# Patient Record
Sex: Male | Born: 1935 | Race: Black or African American | Hispanic: No | Marital: Married | Smoking: Former smoker
Health system: Southern US, Community
[De-identification: ages and names within clinical notes are randomized; demographics above are authoritative.]

## PROBLEM LIST (undated history)

## (undated) DIAGNOSIS — I428 Other cardiomyopathies: Secondary | ICD-10-CM

## (undated) DIAGNOSIS — I509 Heart failure, unspecified: Secondary | ICD-10-CM

## (undated) DIAGNOSIS — K5641 Fecal impaction: Secondary | ICD-10-CM

## (undated) DIAGNOSIS — I519 Heart disease, unspecified: Secondary | ICD-10-CM

## (undated) DIAGNOSIS — I495 Sick sinus syndrome: Secondary | ICD-10-CM

## (undated) DIAGNOSIS — I513 Intracardiac thrombosis, not elsewhere classified: Secondary | ICD-10-CM

## (undated) DIAGNOSIS — E119 Type 2 diabetes mellitus without complications: Secondary | ICD-10-CM

## (undated) DIAGNOSIS — Z950 Presence of cardiac pacemaker: Secondary | ICD-10-CM

## (undated) DIAGNOSIS — R001 Bradycardia, unspecified: Secondary | ICD-10-CM

## (undated) DIAGNOSIS — E039 Hypothyroidism, unspecified: Secondary | ICD-10-CM

## (undated) DIAGNOSIS — M109 Gout, unspecified: Secondary | ICD-10-CM

## (undated) DIAGNOSIS — K219 Gastro-esophageal reflux disease without esophagitis: Secondary | ICD-10-CM

## (undated) DIAGNOSIS — C61 Malignant neoplasm of prostate: Secondary | ICD-10-CM

## (undated) DIAGNOSIS — I4891 Unspecified atrial fibrillation: Secondary | ICD-10-CM

## (undated) DIAGNOSIS — R0601 Orthopnea: Secondary | ICD-10-CM

## (undated) DIAGNOSIS — I499 Cardiac arrhythmia, unspecified: Secondary | ICD-10-CM

## (undated) DIAGNOSIS — I480 Paroxysmal atrial fibrillation: Secondary | ICD-10-CM

## (undated) DIAGNOSIS — I10 Essential (primary) hypertension: Secondary | ICD-10-CM

## (undated) DIAGNOSIS — N183 Chronic kidney disease, stage 3 (moderate): Secondary | ICD-10-CM

## (undated) DIAGNOSIS — J189 Pneumonia, unspecified organism: Secondary | ICD-10-CM

## (undated) DIAGNOSIS — M1990 Unspecified osteoarthritis, unspecified site: Secondary | ICD-10-CM

## (undated) HISTORY — PX: CORONARY ANGIOPLASTY WITH STENT PLACEMENT: SHX49

## (undated) HISTORY — DX: Other cardiomyopathies: I42.8

## (undated) HISTORY — PX: INSERT / REPLACE / REMOVE PACEMAKER: SUR710

## (undated) HISTORY — PX: PROSTATE SURGERY: SHX751

---

## 1999-06-13 ENCOUNTER — Encounter: Payer: Self-pay | Admitting: Internal Medicine

## 1999-06-13 ENCOUNTER — Ambulatory Visit (HOSPITAL_COMMUNITY): Admission: RE | Admit: 1999-06-13 | Discharge: 1999-06-13 | Payer: Self-pay | Admitting: Internal Medicine

## 2001-02-09 ENCOUNTER — Other Ambulatory Visit: Admission: RE | Admit: 2001-02-09 | Discharge: 2001-02-09 | Payer: Self-pay | Admitting: Urology

## 2001-02-09 ENCOUNTER — Encounter (INDEPENDENT_AMBULATORY_CARE_PROVIDER_SITE_OTHER): Payer: Self-pay

## 2001-02-16 ENCOUNTER — Encounter: Admission: RE | Admit: 2001-02-16 | Discharge: 2001-02-16 | Payer: Self-pay | Admitting: Urology

## 2001-02-16 ENCOUNTER — Encounter: Payer: Self-pay | Admitting: Urology

## 2002-02-25 ENCOUNTER — Encounter: Payer: Self-pay | Admitting: Internal Medicine

## 2002-02-25 ENCOUNTER — Ambulatory Visit (HOSPITAL_COMMUNITY): Admission: RE | Admit: 2002-02-25 | Discharge: 2002-02-25 | Payer: Self-pay | Admitting: Internal Medicine

## 2003-12-18 ENCOUNTER — Ambulatory Visit (HOSPITAL_BASED_OUTPATIENT_CLINIC_OR_DEPARTMENT_OTHER): Admission: RE | Admit: 2003-12-18 | Discharge: 2003-12-18 | Payer: Self-pay | Admitting: Internal Medicine

## 2003-12-22 ENCOUNTER — Encounter: Admission: RE | Admit: 2003-12-22 | Discharge: 2003-12-22 | Payer: Self-pay | Admitting: Vascular Surgery

## 2003-12-27 ENCOUNTER — Encounter: Admission: RE | Admit: 2003-12-27 | Discharge: 2003-12-27 | Payer: Self-pay | Admitting: Vascular Surgery

## 2005-08-01 ENCOUNTER — Ambulatory Visit (HOSPITAL_COMMUNITY): Admission: RE | Admit: 2005-08-01 | Discharge: 2005-08-01 | Payer: Self-pay | Admitting: Internal Medicine

## 2007-03-19 ENCOUNTER — Emergency Department (HOSPITAL_COMMUNITY): Admission: EM | Admit: 2007-03-19 | Discharge: 2007-03-19 | Payer: Self-pay | Admitting: Emergency Medicine

## 2007-05-27 ENCOUNTER — Inpatient Hospital Stay (HOSPITAL_COMMUNITY): Admission: EM | Admit: 2007-05-27 | Discharge: 2007-05-30 | Payer: Self-pay | Admitting: Emergency Medicine

## 2007-05-28 ENCOUNTER — Encounter: Payer: Self-pay | Admitting: Internal Medicine

## 2007-05-28 HISTORY — PX: CARDIAC CATHETERIZATION: SHX172

## 2009-04-28 ENCOUNTER — Inpatient Hospital Stay (HOSPITAL_COMMUNITY): Admission: EM | Admit: 2009-04-28 | Discharge: 2009-05-04 | Payer: Self-pay | Admitting: Emergency Medicine

## 2009-05-24 ENCOUNTER — Encounter
Admission: RE | Admit: 2009-05-24 | Discharge: 2009-08-22 | Payer: Self-pay | Source: Home / Self Care | Admitting: Internal Medicine

## 2009-06-06 ENCOUNTER — Ambulatory Visit (HOSPITAL_COMMUNITY): Admission: RE | Admit: 2009-06-06 | Discharge: 2009-06-06 | Payer: Self-pay | Admitting: Internal Medicine

## 2009-09-20 ENCOUNTER — Encounter: Admission: RE | Admit: 2009-09-20 | Discharge: 2009-09-20 | Payer: Self-pay | Admitting: Internal Medicine

## 2010-05-06 ENCOUNTER — Encounter: Payer: Self-pay | Admitting: Vascular Surgery

## 2010-07-01 LAB — COMPREHENSIVE METABOLIC PANEL
ALT: 59 U/L — ABNORMAL HIGH (ref 0–53)
AST: 57 U/L — ABNORMAL HIGH (ref 0–37)
Alkaline Phosphatase: 75 U/L (ref 39–117)
BUN: 20 mg/dL (ref 6–23)
Calcium: 8.6 mg/dL (ref 8.4–10.5)
Chloride: 101 mEq/L (ref 96–112)
Creatinine, Ser: 1.6 mg/dL — ABNORMAL HIGH (ref 0.4–1.5)
GFR calc Af Amer: 52 mL/min — ABNORMAL LOW (ref >60)
GFR calc non Af Amer: 43 mL/min — ABNORMAL LOW (ref >60)
Glucose, Bld: 395 mg/dL — ABNORMAL HIGH (ref 70–99)
Potassium: 3.8 mEq/L (ref 3.5–5.1)
Total Protein: 7.6 g/dL (ref 6.0–8.3)

## 2010-07-01 LAB — CK: Total CK: 880 U/L — ABNORMAL HIGH (ref 7–232)

## 2010-07-01 LAB — URINALYSIS, ROUTINE W REFLEX MICROSCOPIC
Glucose, UA: >1000 mg/dL — AB
Nitrite: NEGATIVE
Specific Gravity, Urine: 1.036 — ABNORMAL HIGH (ref 1.005–1.030)
Urobilinogen, UA: 1 mg/dL (ref 0.0–1.0)

## 2010-07-01 LAB — GLUCOSE, CAPILLARY
Glucose-Capillary: 184 mg/dL — ABNORMAL HIGH (ref 70–99)
Glucose-Capillary: 300 mg/dL — ABNORMAL HIGH (ref 70–99)
Glucose-Capillary: 387 mg/dL — ABNORMAL HIGH (ref 70–99)

## 2010-07-01 LAB — CARDIAC PANEL(CRET KIN+CKTOT+MB+TROPI)
CK, MB: 3.4 ng/mL (ref 0.3–4.0)
Relative Index: 0.4 (ref 0.0–2.5)

## 2010-07-01 LAB — PROTIME-INR
Prothrombin Time: 13.8 seconds (ref 11.6–15.2)
Prothrombin Time: 14.3 seconds (ref 11.6–15.2)

## 2010-07-01 LAB — DIFFERENTIAL
Eosinophils Absolute: 0.1 10*3/uL (ref 0.0–0.7)
Lymphocytes Relative: 27 % (ref 12–46)
Monocytes Relative: 11 % (ref 3–12)

## 2010-07-01 LAB — URINE CULTURE

## 2010-07-01 LAB — CBC
Hemoglobin: 14 g/dL (ref 13.0–17.0)
MCHC: 35.1 g/dL (ref 30.0–36.0)
MCV: 92.8 fL (ref 78.0–100.0)
Platelets: 134 10*3/uL — ABNORMAL LOW (ref 150–400)
RBC: 4.3 MIL/uL (ref 4.22–5.81)
RBC: 4.59 MIL/uL (ref 4.22–5.81)
RDW: 14.2 % (ref 11.5–15.5)
WBC: 5 10*3/uL (ref 4.0–10.5)

## 2010-07-01 LAB — MAGNESIUM: Magnesium: 1.9 mg/dL (ref 1.5–2.5)

## 2010-07-01 LAB — BASIC METABOLIC PANEL
CO2: 25 mEq/L (ref 19–32)
Chloride: 101 mEq/L (ref 96–112)
Glucose, Bld: 311 mg/dL — ABNORMAL HIGH (ref 70–99)
Potassium: 3.3 mEq/L — ABNORMAL LOW (ref 3.5–5.1)
Sodium: 136 mEq/L (ref 135–145)

## 2010-07-01 LAB — URINE MICROSCOPIC-ADD ON

## 2010-07-01 LAB — HEMOGLOBIN A1C: Mean Plasma Glucose: 266 mg/dL

## 2010-07-01 LAB — SEDIMENTATION RATE: Sed Rate: 59 mm/hr — ABNORMAL HIGH (ref 0–16)

## 2010-07-01 LAB — ALDOLASE: Aldolase: 10.5 U/L — ABNORMAL HIGH (ref <=8.1)

## 2010-07-02 LAB — ALDOLASE: Aldolase: 8.2 U/L — ABNORMAL HIGH (ref <=8.1)

## 2010-07-02 LAB — GLUCOSE, CAPILLARY
Glucose-Capillary: 132 mg/dL — ABNORMAL HIGH (ref 70–99)
Glucose-Capillary: 226 mg/dL — ABNORMAL HIGH (ref 70–99)
Glucose-Capillary: 239 mg/dL — ABNORMAL HIGH (ref 70–99)
Glucose-Capillary: 242 mg/dL — ABNORMAL HIGH (ref 70–99)
Glucose-Capillary: 264 mg/dL — ABNORMAL HIGH (ref 70–99)
Glucose-Capillary: 274 mg/dL — ABNORMAL HIGH (ref 70–99)
Glucose-Capillary: 278 mg/dL — ABNORMAL HIGH (ref 70–99)
Glucose-Capillary: 291 mg/dL — ABNORMAL HIGH (ref 70–99)

## 2010-07-02 LAB — BASIC METABOLIC PANEL
BUN: 14 mg/dL (ref 6–23)
BUN: 15 mg/dL (ref 6–23)
Calcium: 8.3 mg/dL — ABNORMAL LOW (ref 8.4–10.5)
Calcium: 8.8 mg/dL (ref 8.4–10.5)
Calcium: 9 mg/dL (ref 8.4–10.5)
Creatinine, Ser: 1.39 mg/dL (ref 0.4–1.5)
GFR calc Af Amer: >60 mL/min (ref >60)
GFR calc non Af Amer: 50 mL/min — ABNORMAL LOW (ref >60)
GFR calc non Af Amer: 50 mL/min — ABNORMAL LOW (ref >60)
GFR calc non Af Amer: 51 mL/min — ABNORMAL LOW (ref >60)
Glucose, Bld: 220 mg/dL — ABNORMAL HIGH (ref 70–99)
Glucose, Bld: 250 mg/dL — ABNORMAL HIGH (ref 70–99)
Glucose, Bld: 261 mg/dL — ABNORMAL HIGH (ref 70–99)
Potassium: 2.8 mEq/L — ABNORMAL LOW (ref 3.5–5.1)
Sodium: 135 mEq/L (ref 135–145)
Sodium: 138 mEq/L (ref 135–145)

## 2010-07-02 LAB — PROTIME-INR
INR: 1.71 — ABNORMAL HIGH (ref 0.00–1.49)
INR: 1.73 — ABNORMAL HIGH (ref 0.00–1.49)
INR: 2.35 — ABNORMAL HIGH (ref 0.00–1.49)
Prothrombin Time: 19.9 seconds — ABNORMAL HIGH (ref 11.6–15.2)
Prothrombin Time: 20.1 seconds — ABNORMAL HIGH (ref 11.6–15.2)
Prothrombin Time: 25.5 seconds — ABNORMAL HIGH (ref 11.6–15.2)

## 2010-07-02 LAB — SJOGRENS SYNDROME-B EXTRACTABLE NUCLEAR ANTIBODY: SSB (La) (ENA) Antibody, IgG: <0.2 AI (ref <1.0)

## 2010-07-02 LAB — CK: Total CK: 246 U/L — ABNORMAL HIGH (ref 7–232)

## 2010-07-02 LAB — GLUCOSE, RANDOM: Glucose, Bld: 375 mg/dL — ABNORMAL HIGH (ref 70–99)

## 2010-07-02 LAB — CLOSTRIDIUM DIFFICILE EIA: C difficile Toxins A+B, EIA: NEGATIVE

## 2010-08-28 NOTE — Cardiovascular Report (Signed)
Vincent, [name] NO.:  [id number]   MEDICAL RECORD NO.:  [id number]          PATIENT TYPE:  INP   LOCATION:  3705                         FACILITY:  [facility]   PHYSICIAN:  Madaline Savage, M.D.DATE OF BIRTH:  February 24, 1936   DATE OF PROCEDURE:  05/28/2007  DATE OF DISCHARGE:                            CARDIAC CATHETERIZATION   PROCEDURES PERFORMED:  1. Selective coronary angiography by Judkins technique.  2. Retrograde left heart catheterization.  3. Left ventricular angiography.  4. Right heart catheterization.  5. Thermodilution cardiac output determinations along with Fick      cardiac output determinations.   COMPLICATIONS:  None.   INTERVENTIONS:  None.   PATIENT PROFILE:  [name] is a medical patient of Dr. Margaretmary Bayley  who has diabetes mellitus, systemic hypertension who is 75 years old and  presented with acute flash pulmonary edema.  He also has a history of  adenocarcinoma of the prostate.  Subsequent cardiac enzymes have been  negative for myocardial infarction, and the patient was brought to the  catheterization lab for evaluation of his clinical presentation and  concern that he may have undiscovered coronary disease or LV systolic  dysfunction.  His creatinine on admission was 1.57.  Upon repeat just  before today's case, his creatinine was 1.6.  The patient was given  steroids prior to today's case.  He was given some normal saline  starting at 8:00 p.m. on the day prior to the procedure.  During the  procedure, no complications occurred.   RESULTS:  Pressures:  Left ventricular pressure was 162/22, end-  diastolic pressure 33.  Central aortic pressure 173/100.  That pressure  was 173/100, mean 125.  The right atrial pressure was 10, mean 13/15.  Right ventricular pressure was 55/5, end-diastolic pressure 11.  Pulmonary artery pressure 52/17, mean 33.  Pulmonary capillary wedge  mean pressure was 18.  Fick cardiac output 4.55.   Thermodilution cardiac  output was 1.9.   ANGIOGRAPHIC RESULTS:  The patient's coronary arteries were  angiographically patent.  The right coronary artery was dominant.  There  was no significant disease in his left or right or circumflex coronary  arteries.  There were some mild luminal irregularities that were not  noteworthy.   FINAL IMPRESSION:  1. Angiographically patent coronary arteries.  2. Left ventricular ejection fraction 50%.  3. Moderate pulmonary arterial hypertension with a pulmonary artery      systolic pressure of 50.  4. Recent congestive heart failure compatible with diastolic      dysfunction.   PLAN:  The patient will be treated medically for his findings.           ______________________________  Madaline Savage, M.D.     WHG/MEDQ  D:  05/28/2007  T:  05/29/2007  Job:  [id number]   cc:   Margaretmary Bayley, M.D.  Madaline Savage, M.D.

## 2010-08-31 NOTE — Procedures (Signed)
NAME:  Vincent [name], Vincent [name]               ACCOUNT NO.:  [id number]   MEDICAL RECORD NO.:  [id number]          PATIENT TYPE:  OUT   LOCATION:  SLEEP CENTER                 FACILITY:  [facility]   PHYSICIAN:  Clinton D. Maple Hudson, M.D. DATE OF BIRTH:  25-Mar-1936   DATE OF ADMISSION:  12/18/2003  DATE OF DISCHARGE:  12/18/2003                              NOCTURNAL POLYSOMNOGRAM   REFERRING PHYSICIAN:  Margaretmary Bayley, M.D.   INDICATIONS FOR STUDY:  Hypersomnia with obstructive sleep apnea. Therefore,  sleepiness score of 5/24. Neck size 17 inches. BMI 42.6. weight 292 pounds.   MEDICATIONS:  Insulin, Norvasc, aspirin, guanfacine, and Fortam.   SLEEP ARCHITECTURE:  Total sleep time 393 minutes with sleep efficiency 87%.  Stage I was 7%, stage II 69%, stages III and IV 11%, and REM was 12% of  total sleep time. Latency to sleep onset 21 minutes. Latency to REM 118  minutes. Awake after sleep onset 52 minutes. Arousal index 26.   RESPIRATORY DATA:  Split study protocol. RDI 38.7 before CPAP consistent  with moderately severe obstructive sleep apnea/hypopnea syndrome. This  included 5 obstructive apneas and 101 hypopneas before CPAP titration. The  events were not positional. REM RDI was 60. CPAP was titrated to 11 CWP and  RDI of 1.9 per hour using a large Respironics soft gel mask.   OXYGEN DATA:  Very loud snoring before CPAP with oxygen desaturation to 84%.  After CPAP control, saturation held 94% to 96%.   CARDIAC DATA:  Normal sinus rhythm.   MOVEMENT/PARASOMNIA:  47 limb jerks were recorded of which 4 were associated  with arousal or awakening for periodic limb movement with arousal index of  0.6 per hour which is considered insignificant.   IMPRESSION/RECOMMENDATIONS:  Moderate obstructive sleep apnea/hypopnea  syndrome, RDI of 38 per hour.  Mild to moderate oxygen desaturation with  apneas at 84%. Good CPAP control at 11 CWP, RDI of 1.9 per hour, using a  large Respironics soft gel  mask.                                   ______________________________                                Rennis Chris. Maple Hudson, M.D.                                Diplomate, American Board of Sleep Medicine    CDY/MEDQ  D:  12/24/2003 09:51:13  T:  12/24/2003 16:38:46  Job:  [id number]

## 2010-08-31 NOTE — Discharge Summary (Signed)
NAMERUVIM, RISKO NO.:  [id number]   MEDICAL RECORD NO.:  [id number]          PATIENT TYPE:  INP   LOCATION:  3736                         FACILITY:  [facility]   PHYSICIAN:  Margaretmary Bayley, M.D.    DATE OF BIRTH:  Feb 11, 1936   DATE OF ADMISSION:  05/27/2007  DATE OF DISCHARGE:  05/30/2007                               DISCHARGE SUMMARY   DISCHARGE DIAGNOSES:  1. Acute congestive heart failure.  2. Diastolic hypertension.  3. Hypertensive heart disease.  4. Chronic obstructive pulmonary disease.  5. Atrial fibrillation.  6. Type 2 diabetes mellitus, insulin-requiring, and significant      insulin resistance.   REASON FOR ADMISSION:  Mr. Ivey is a 75 year old hypertensive and  poorly-controlled type 2 diabetic with documented carcinoma of the  prostate who was brought to the emergency room with the complaint of the  acute onset of wheezing and shortness of breath.  The patient states  that he had gone to bed the night prior to admission feeling quite well.  He had no respiratory complaints whatsoever.  He awakened at 2:30 in the  morning with some wheezing which was new for him.  He had no history of  asthma or congestive heart failure in the past.  He denied any  associated chest pain.  When it became obvious that he was not getting  any better, paramedics were called and the patient was brought into the  emergency room for treatment and diagnostic evaluation.   PERTINENT PHYSICAL FINDINGS:  GENERAL:  He is a well-developed obese  gentleman with a blood pressure of 214/90 and falling to 170/88 after  diuretics were given.  HEENT:  Unremarkable.  NECK:  Supple.  There was no jugular venous distention at 60 degrees,  but positive with JVD at 30 degrees.  No carotid bruits.  CHEST:  No splinting, tenderness, deformities.  He had bilateral rales  at the bases and about 1/4 all the way up going superiorly.  CARDIAC:  He had a regular rhythm with a 3+  hyperdynamic precordium.  He  did have a summation gallop, but no discernible rubs or murmurs noted.  ABDOMEN:  Obese, soft, and nontender.  No organomegaly and no mass.  EXTREMITIES:  No clubbing, cyanosis, or edema.  NEUROLOGIC:  He was alert, oriented, cooperative without any focal,  sensory, or motor deficits.   PERTINENT LABORATORY AND X-RAY DATA:  His admission white count is  13,100, hemoglobin 14.3 with hematocrit of 43.  CK-MB of 3.2 and  troponin of less than 0.05.  His comprehensive metabolic panel was  normal with exception of a glucose elevation of 322.  His BUN is 28 and  creatinine is 1.57.  Urinalysis, pH of 6.5, negative nitrites and  leukocyte esterase.  His SaO2 was 95% on room air.  His EKG revealed a  normal sinus rhythm.  No deviation of the electrical axis.  He did have  T-wave inversions in the anterolateral leads.  No acute ST elevations  and no Q waves. On a subsequent followup EKG the day after his  admission,  the patient was noted to have episodes of atrial fibrillation  with a rapid ventricular response and some aberrantly conducted beats as  well.  In general, however, his serial EKGs revealed no evolutionary  changes suggestive of an acute myocardial infarction.  His chest x-ray  revealed increased prominence of the pulmonary vessels.  He had some  peribronchial cuffing and some cardiomegaly, but no effusions.   HOSPITAL COURSE:  The patient was admitted with congestive heart failure  clinically and radiologically with episodes of sporadic atrial  fibrillation.  He responded quite well to parenteral Lasix with a brisk  diuresis.  He was started back on Diovan at 320 mg per day, Norvasc at  10 mg daily, Ecotrin, Protonix, and his diabetic control was achieved  grudgingly using a combination of basal insulin in the form of Lantus  and preprandial NovoLog, but the doses are being based on his blood  sugar levels.  He subsequently underwent a cardiac  catheterization  having been evaluated by Dr. Chanda Busing.  The patient was found not  to have any evidence of significant coronary artery disease.  This is  Dr. Truett Perna feeling that the patient required very aggressive control  of his blood pressure and suggested that the patient be treated with  sotalol at 80 mg twice a day for his intermittent and sporadic atrial  fibrillation.  Remainder of his hospitalization was uneventful.   DISCHARGE MEDICATIONS:  1. Coreg, which was to be continued as prior to admission at a dose of      12.5 mg b.i.d.  2. Diovan at 320 mg once a day.  3. Furosemide 80 mg once a day.  4. Zocor 40 mg daily.  5. Januvia 100 mg per day.  6. Sotalol at 80 mg b.i.d.  7. Ecotrin 162 mg daily.   The patient is scheduled to be seen by Dr. Elsie Lincoln in 2 weeks and he will  be seen in my office at about the same time.   DISCHARGE CONDITION:  Significantly improved.  He is discharged home on  a 3 g sodium, 1800 calorie carb modified fat-restricted diet.      Margaretmary Bayley, M.D.  Electronically Signed     PC/MEDQ  D:  07/16/2007  T:  07/17/2007  Job:  [id number]

## 2010-12-10 ENCOUNTER — Inpatient Hospital Stay (HOSPITAL_COMMUNITY)
Admission: EM | Admit: 2010-12-10 | Discharge: 2010-12-16 | DRG: 638 | Disposition: A | Discharge status: Home Health | Payer: Medicare Other | Source: Ambulatory Visit | Attending: Internal Medicine | Admitting: Internal Medicine

## 2010-12-10 DIAGNOSIS — Z7901 Long term (current) use of anticoagulants: Secondary | ICD-10-CM

## 2010-12-10 DIAGNOSIS — I4891 Unspecified atrial fibrillation: Secondary | ICD-10-CM | POA: Diagnosis present

## 2010-12-10 DIAGNOSIS — E871 Hypo-osmolality and hyponatremia: Secondary | ICD-10-CM | POA: Diagnosis present

## 2010-12-10 DIAGNOSIS — G4733 Obstructive sleep apnea (adult) (pediatric): Secondary | ICD-10-CM | POA: Diagnosis present

## 2010-12-10 DIAGNOSIS — Z7982 Long term (current) use of aspirin: Secondary | ICD-10-CM

## 2010-12-10 DIAGNOSIS — E876 Hypokalemia: Secondary | ICD-10-CM | POA: Diagnosis not present

## 2010-12-10 DIAGNOSIS — I509 Heart failure, unspecified: Secondary | ICD-10-CM | POA: Diagnosis present

## 2010-12-10 DIAGNOSIS — J4489 Other specified chronic obstructive pulmonary disease: Secondary | ICD-10-CM | POA: Diagnosis present

## 2010-12-10 DIAGNOSIS — I129 Hypertensive chronic kidney disease with stage 1 through stage 4 chronic kidney disease, or unspecified chronic kidney disease: Secondary | ICD-10-CM | POA: Diagnosis present

## 2010-12-10 DIAGNOSIS — I5032 Chronic diastolic (congestive) heart failure: Secondary | ICD-10-CM | POA: Diagnosis present

## 2010-12-10 DIAGNOSIS — Z794 Long term (current) use of insulin: Secondary | ICD-10-CM

## 2010-12-10 DIAGNOSIS — I959 Hypotension, unspecified: Secondary | ICD-10-CM | POA: Diagnosis not present

## 2010-12-10 DIAGNOSIS — N182 Chronic kidney disease, stage 2 (mild): Secondary | ICD-10-CM | POA: Diagnosis present

## 2010-12-10 DIAGNOSIS — N179 Acute kidney failure, unspecified: Secondary | ICD-10-CM | POA: Diagnosis present

## 2010-12-10 DIAGNOSIS — I4892 Unspecified atrial flutter: Secondary | ICD-10-CM | POA: Diagnosis present

## 2010-12-10 DIAGNOSIS — N390 Urinary tract infection, site not specified: Secondary | ICD-10-CM | POA: Diagnosis not present

## 2010-12-10 DIAGNOSIS — E1310 Other specified diabetes mellitus with ketoacidosis without coma: Principal | ICD-10-CM | POA: Diagnosis present

## 2010-12-10 DIAGNOSIS — C61 Malignant neoplasm of prostate: Secondary | ICD-10-CM | POA: Diagnosis present

## 2010-12-10 DIAGNOSIS — B952 Enterococcus as the cause of diseases classified elsewhere: Secondary | ICD-10-CM | POA: Diagnosis not present

## 2010-12-10 DIAGNOSIS — J449 Chronic obstructive pulmonary disease, unspecified: Secondary | ICD-10-CM | POA: Diagnosis present

## 2010-12-10 LAB — DIFFERENTIAL
Basophils Absolute: 0 10*3/uL (ref 0.0–0.1)
Eosinophils Absolute: 0.1 10*3/uL (ref 0.0–0.7)
Eosinophils Relative: 2 % (ref 0–5)
Lymphocytes Relative: 28 % (ref 12–46)
Neutrophils Relative %: 64 % (ref 43–77)

## 2010-12-10 LAB — POCT I-STAT, CHEM 8
Calcium, Ion: 1.11 mmol/L — ABNORMAL LOW (ref 1.12–1.32)
Creatinine, Ser: 2.1 mg/dL — ABNORMAL HIGH (ref 0.50–1.35)
Glucose, Bld: >700 mg/dL (ref 70–99)
HCT: 44 % (ref 39.0–52.0)
Hemoglobin: 15 g/dL (ref 13.0–17.0)
Potassium: 3.6 mEq/L (ref 3.5–5.1)
TCO2: 19 mmol/L (ref 0–100)

## 2010-12-10 LAB — COMPREHENSIVE METABOLIC PANEL
AST: 27 U/L (ref 0–37)
CO2: 18 mEq/L — ABNORMAL LOW (ref 19–32)
Chloride: 89 mEq/L — ABNORMAL LOW (ref 96–112)
Creatinine, Ser: 2.08 mg/dL — ABNORMAL HIGH (ref 0.50–1.35)
GFR calc Af Amer: 38 mL/min — ABNORMAL LOW (ref >60)
GFR calc non Af Amer: 31 mL/min — ABNORMAL LOW (ref >60)
Glucose, Bld: 835 mg/dL (ref 70–99)
Total Bilirubin: 0.3 mg/dL (ref 0.3–1.2)

## 2010-12-10 LAB — POCT I-STAT TROPONIN I: Troponin i, poc: 0.04 ng/mL (ref 0.00–0.08)

## 2010-12-10 LAB — CBC
HCT: 38.7 % — ABNORMAL LOW (ref 39.0–52.0)
Hemoglobin: 13.8 g/dL (ref 13.0–17.0)
MCH: 33.7 pg (ref 26.0–34.0)
MCHC: 35.7 g/dL (ref 30.0–36.0)
MCV: 94.4 fL (ref 78.0–100.0)
RDW: 13 % (ref 11.5–15.5)

## 2010-12-10 LAB — POCT I-STAT 3, VENOUS BLOOD GAS (G3P V)
Acid-base deficit: 3 mmol/L — ABNORMAL HIGH (ref 0.0–2.0)
O2 Saturation: 95 %
pCO2, Ven: 34.2 mmHg — ABNORMAL LOW (ref 45.0–50.0)

## 2010-12-10 LAB — URINALYSIS, ROUTINE W REFLEX MICROSCOPIC
Bilirubin Urine: NEGATIVE
Glucose, UA: >1000 mg/dL — AB
Ketones, ur: NEGATIVE mg/dL
Nitrite: NEGATIVE
Protein, ur: NEGATIVE mg/dL
Specific Gravity, Urine: 1.021 (ref 1.005–1.030)
Urobilinogen, UA: 0.2 mg/dL (ref 0.0–1.0)
pH: 5.5 (ref 5.0–8.0)

## 2010-12-10 LAB — BASIC METABOLIC PANEL
CO2: 26 mEq/L (ref 19–32)
Glucose, Bld: 496 mg/dL — ABNORMAL HIGH (ref 70–99)
Potassium: 3.2 mEq/L — ABNORMAL LOW (ref 3.5–5.1)
Sodium: 134 mEq/L — ABNORMAL LOW (ref 135–145)

## 2010-12-10 LAB — PROTIME-INR
INR: 1.65 — ABNORMAL HIGH (ref 0.00–1.49)
Prothrombin Time: 19.8 seconds — ABNORMAL HIGH (ref 11.6–15.2)

## 2010-12-10 LAB — GLUCOSE, CAPILLARY

## 2010-12-10 NOTE — H&P (Signed)
NAME:  [name], [name] NO.:  [id number]  MEDICAL RECORD NO.:  [id number]  LOCATION:  [facility]                         FACILITY:  [facility]  PHYSICIAN:  Andreas Blower, MD       DATE OF BIRTH:  08/19/1935  DATE OF ADMISSION:  12/10/2010 DATE OF DISCHARGE:                             HISTORY & PHYSICAL   PRIMARY CARE PHYSICIAN:  Margaretmary Bayley, MD  UROLOGIST:  Dr. Allison Quarry.  CARDIOLOGIST:  Dr. Willa Rough.  CHIEF COMPLAINT:  Elevated blood sugar.  HISTORY OF PRESENT ILLNESS:  [name] is a 75 year old African American male with history of diabetes, hypertension, COPD, history of diastolic heart failure with an ejection fraction of 40% to 45%, prostate cancer who presents with the above complaints.  The patient has been getting care under Dr. Allison Quarry with Urology for prostate cancer. The patient has been on hydrocortisone and ketoconazole.  The patient had seen his urologist today for regular followup.  He had gone home, however, he was called back by Dr. Orvilla Cornwall office instructing the patient to go to the emergency department as his blood sugars were greater than 800.  The patient reports that he has run out of his insulin and has not been taking a 70/30 NovoLog.  Denies any recent fevers, chills, nausea, or vomiting.  Denies any chest pain or shortness of breath.  Denies any abdominal pain or diarrhea.  Denies any headaches or vision changes.  Did not report any changes in his weight.  Has been getting up at least two times during the night to go to the bathroom. Denies any vision changes.  PAST MEDICAL HISTORY: 1. Hypertension. 2. Type 2 diabetes. 3. [name] 4. History of AFib, on chronic anticoagulation. 5. History of obstructive sleep apnea, was never instructed to be on     CPAP per the patient. 6. History of diastolic heart failure with an ejection fraction of 45%     to 50%. 7. Chronic kidney disease stage II.  SOCIAL HISTORY:  The patient  quit drinking alcohol about 30 years ago. Lives at home with wife and 3 grandchildren.  FAMILY HISTORY:  Significant for father having prostate cancer and mother having cancer, which is unknown.  HOME MEDICATIONS: 1. Hydralazine 25 mg p.o. twice daily. 2. Amlodipine/valsartan 10/320 one tablet p.o. daily. 3. Ibuprofen over-the-counter 2 tablets daily as needed. 4. NovoLog 70/30 sliding scale in the morning and 40 units in the     evening. 5. Warfarin 5 mg on Tuesday, Thursday, and Saturday and 2.5 mg on all     other days. 6. Hydrocortisone 20 mg p.o. twice daily. 7. Furosemide 80 mg p.o. daily. 8. Aspirin 81 mg p.o. daily. 9. Carvedilol 18.75 mg p.o. twice daily. 10.Spironolactone 25 mg p.o. daily. 11.Ketoconazole 200 mg p.o. three times a day.  PHYSICAL EXAM:  VITALS:  Temperature is 99.0, blood pressure 138/75, heart rate 130, respirations 18, satting at 100% on room air. GENERAL:  The patient was alert and oriented, did not appear to be in any acute distress, was lying in bed comfortably. HEENT:  Extraocular motions were intact.  Pupils were equal and round. Had moist mucous membranes.  NECK:  Supple. HEART:  Tachycardic with S1and S2, was regular. LUNGS:  Clear to auscultation bilaterally. ABDOMEN:  Soft, nontender, nondistended.  Positive bowel sounds.  Obese. EXTREMITIES:  The patient had good peripheral pulses with trace edema. NEURO:  Cranial nerves II through XII grossly intact.  Had 5/5 motor strength in upper as well as lower extremities.  LABORATORY DATA:  CBC shows a white count of 8.8, hemoglobin 15.0, hematocrit 44.0, platelet count 212.  INR 1.65.  Electrolytes; sodium 138, potassium 3.6, chloride 95, CO2 of 18, BUN 31, creatinine 2.10. Liver function tests normal.  Initial troponin point-of-care was 0.04. UA is negative for nitrites and small leukocytes.  ASSESSMENT AND PLAN: 1. Diabetic ketoacidosis.  The patient has anion gap of 15.  We will     start the  patient insulin drip.  Blood sugars are likely elevated     due to steroids.  The patient was instructed not to miss his     doses of insulin.  The patient is on 70/30 regimen and he uses     this as a sliding scale during the day as well as takes it     scheduled during the night.  Would consider change his regimen     to Lantus and NovoLog.  Will get diabetic nurse to educate the     patient.  After his anion gap closes and once his blood sugars are     improved to less than 250, consider transitioning the patient from     an insulin drip to Lantus and NovoLog. 2. Atrial fibrillation/flutter with rapid ventricular response, not     well rate controlled at this time.  Continue Coreg.  Will start the     patient on diltiazem as well.  Continue Coumadin.  Suspect the     patient may be tachycardic due to dehydration from diabetic     ketoacidosis. 3. Dehydration.  We will aggressively hydrate the patient on fluids.     The patient has a history of diastolic heart failure, so we will be     careful with fluids after his diabetic ketoacidosis and atrial     fibrillation with rapid ventricular response resolves. 4. [name]  The patient is on hydrocortisone andketoconazole started by his urologist.  Uncertain why the patient     is on hydrocortisone and ketoconazole.  Will obtain records from     Dr. Allison Quarry, his urologist. 5. Hypertension.  We will hold his amlodipine and valsartan.  We will     continue hydralazine and Coreg.  Also will be holding his     furosemide and spironolactone.  The patient will be started on     diltiazem. 6. Acute renal failure on chronic kidney disease stage II, likely due     to dehydration from hyperglycemia.  The patient's spironolactone     and furosemide will be held, will reassess. 7. Hyponatremia likely due to hyperosmolarity from hyperglycemia.     Monitor for now. 8. Obstructive sleep apnea.  The patient reports that he was never      instructed to use a CPAP.  Further management as an outpatient. 9. Morbid obesity.  Diet and exercise as an outpatient. 10.Prophylaxis.  Continue Coumadin. 11.Code status.  The patient is full code.  This was discussed with     the patient and family at the time of admission.  Time spent on admission talking with the patient, family, and coordinating care was  50 minutes.   Andreas Blower, MD   SR/MEDQ  D:  12/10/2010  T:  12/10/2010  Job:  [id number]  Electronically Signed by Wardell Heath Quianna Avery  on 12/10/2010 08:44:38 PM

## 2010-12-11 LAB — CARDIAC PANEL(CRET KIN+CKTOT+MB+TROPI)
CK, MB: 7 ng/mL (ref 0.3–4.0)
Relative Index: 1.9 (ref 0.0–2.5)
Relative Index: 1.9 (ref 0.0–2.5)
Total CK: 366 U/L — ABNORMAL HIGH (ref 7–232)
Total CK: 363 U/L — ABNORMAL HIGH (ref 7–232)
Troponin I: <0.3 ng/mL (ref <0.30)
Troponin I: <0.3 ng/mL (ref <0.30)

## 2010-12-11 LAB — BASIC METABOLIC PANEL
BUN: 26 mg/dL — ABNORMAL HIGH (ref 6–23)
CO2: 25 mEq/L (ref 19–32)
CO2: 26 mEq/L (ref 19–32)
CO2: 24 mEq/L (ref 19–32)
CO2: 22 mEq/L (ref 19–32)
CO2: 23 mEq/L (ref 19–32)
Calcium: 8.9 mg/dL (ref 8.4–10.5)
Calcium: 8.5 mg/dL (ref 8.4–10.5)
Calcium: 8.9 mg/dL (ref 8.4–10.5)
Calcium: 8.6 mg/dL (ref 8.4–10.5)
Chloride: 97 mEq/L (ref 96–112)
Chloride: 104 mEq/L (ref 96–112)
Chloride: 99 mEq/L (ref 96–112)
GFR calc Af Amer: 37 mL/min — ABNORMAL LOW (ref >60)
GFR calc Af Amer: 32 mL/min — ABNORMAL LOW (ref >60)
GFR calc non Af Amer: 34 mL/min — ABNORMAL LOW (ref >60)
GFR calc non Af Amer: 31 mL/min — ABNORMAL LOW (ref >60)
GFR calc non Af Amer: 27 mL/min — ABNORMAL LOW (ref >60)
Glucose, Bld: 426 mg/dL — ABNORMAL HIGH (ref 70–99)
Glucose, Bld: 240 mg/dL — ABNORMAL HIGH (ref 70–99)
Glucose, Bld: 296 mg/dL — ABNORMAL HIGH (ref 70–99)
Glucose, Bld: 330 mg/dL — ABNORMAL HIGH (ref 70–99)
Glucose, Bld: 373 mg/dL — ABNORMAL HIGH (ref 70–99)
Potassium: 3.1 mEq/L — ABNORMAL LOW (ref 3.5–5.1)
Potassium: 3.6 mEq/L (ref 3.5–5.1)
Potassium: 3.8 mEq/L (ref 3.5–5.1)
Sodium: 135 mEq/L (ref 135–145)
Sodium: 136 mEq/L (ref 135–145)
Sodium: 139 mEq/L (ref 135–145)
Sodium: 135 mEq/L (ref 135–145)
Sodium: 136 mEq/L (ref 135–145)

## 2010-12-11 LAB — GLUCOSE, CAPILLARY
Glucose-Capillary: 225 mg/dL — ABNORMAL HIGH (ref 70–99)
Glucose-Capillary: 258 mg/dL — ABNORMAL HIGH (ref 70–99)
Glucose-Capillary: 407 mg/dL — ABNORMAL HIGH (ref 70–99)
Glucose-Capillary: >600 mg/dL (ref 70–99)
Glucose-Capillary: 194 mg/dL — ABNORMAL HIGH (ref 70–99)
Glucose-Capillary: 212 mg/dL — ABNORMAL HIGH (ref 70–99)

## 2010-12-11 LAB — LACTIC ACID, PLASMA: Lactic Acid, Venous: 2.9 mmol/L — ABNORMAL HIGH (ref 0.5–2.2)

## 2010-12-11 LAB — CBC
Hemoglobin: 12.8 g/dL — ABNORMAL LOW (ref 13.0–17.0)
MCH: 31.8 pg (ref 26.0–34.0)
MCV: 91.3 fL (ref 78.0–100.0)
RBC: 4.02 MIL/uL — ABNORMAL LOW (ref 4.22–5.81)

## 2010-12-11 LAB — PROTIME-INR: INR: 1.9 — ABNORMAL HIGH (ref 0.00–1.49)

## 2010-12-12 LAB — CARDIAC PANEL(CRET KIN+CKTOT+MB+TROPI)
CK, MB: 6.5 ng/mL (ref 0.3–4.0)
Total CK: 338 U/L — ABNORMAL HIGH (ref 7–232)
Troponin I: <0.3 ng/mL (ref <0.30)

## 2010-12-12 LAB — CBC
MCH: 31.9 pg (ref 26.0–34.0)
MCHC: 34.4 g/dL (ref 30.0–36.0)
MCV: 92.6 fL (ref 78.0–100.0)
Platelets: 183 10*3/uL (ref 150–400)
RDW: 13.5 % (ref 11.5–15.5)

## 2010-12-12 LAB — GLUCOSE, CAPILLARY
Glucose-Capillary: 280 mg/dL — ABNORMAL HIGH (ref 70–99)
Glucose-Capillary: 279 mg/dL — ABNORMAL HIGH (ref 70–99)
Glucose-Capillary: 351 mg/dL — ABNORMAL HIGH (ref 70–99)
Glucose-Capillary: 224 mg/dL — ABNORMAL HIGH (ref 70–99)

## 2010-12-12 LAB — BASIC METABOLIC PANEL
BUN: 26 mg/dL — ABNORMAL HIGH (ref 6–23)
CO2: 21 mEq/L (ref 19–32)
Calcium: 8.7 mg/dL (ref 8.4–10.5)
GFR calc non Af Amer: 29 mL/min — ABNORMAL LOW (ref >60)
Glucose, Bld: 433 mg/dL — ABNORMAL HIGH (ref 70–99)
Sodium: 135 mEq/L (ref 135–145)

## 2010-12-12 LAB — PROTIME-INR: Prothrombin Time: 26.3 seconds — ABNORMAL HIGH (ref 11.6–15.2)

## 2010-12-13 LAB — CBC
MCH: 32.4 pg (ref 26.0–34.0)
MCHC: 35 g/dL (ref 30.0–36.0)
MCV: 92.5 fL (ref 78.0–100.0)
Platelets: 187 10*3/uL (ref 150–400)

## 2010-12-13 LAB — URINE CULTURE
Colony Count: >=100000
Culture  Setup Time: 201208282242

## 2010-12-13 LAB — BASIC METABOLIC PANEL
BUN: 22 mg/dL (ref 6–23)
Creatinine, Ser: 1.75 mg/dL — ABNORMAL HIGH (ref 0.50–1.35)
GFR calc Af Amer: 46 mL/min — ABNORMAL LOW (ref >60)
GFR calc non Af Amer: 38 mL/min — ABNORMAL LOW (ref >60)
Glucose, Bld: 111 mg/dL — ABNORMAL HIGH (ref 70–99)
Potassium: 3.4 mEq/L — ABNORMAL LOW (ref 3.5–5.1)

## 2010-12-13 LAB — GLUCOSE, CAPILLARY
Glucose-Capillary: 142 mg/dL — ABNORMAL HIGH (ref 70–99)
Glucose-Capillary: 112 mg/dL — ABNORMAL HIGH (ref 70–99)
Glucose-Capillary: 277 mg/dL — ABNORMAL HIGH (ref 70–99)
Glucose-Capillary: 317 mg/dL — ABNORMAL HIGH (ref 70–99)

## 2010-12-13 LAB — PROTIME-INR: Prothrombin Time: 29.7 seconds — ABNORMAL HIGH (ref 11.6–15.2)

## 2010-12-14 LAB — BASIC METABOLIC PANEL
Calcium: 8.9 mg/dL (ref 8.4–10.5)
Chloride: 101 mEq/L (ref 96–112)
Creatinine, Ser: 1.55 mg/dL — ABNORMAL HIGH (ref 0.50–1.35)
GFR calc Af Amer: 53 mL/min — ABNORMAL LOW (ref >60)
GFR calc non Af Amer: 44 mL/min — ABNORMAL LOW (ref >60)

## 2010-12-14 LAB — GLUCOSE, CAPILLARY
Glucose-Capillary: 203 mg/dL — ABNORMAL HIGH (ref 70–99)
Glucose-Capillary: 209 mg/dL — ABNORMAL HIGH (ref 70–99)
Glucose-Capillary: 339 mg/dL — ABNORMAL HIGH (ref 70–99)

## 2010-12-14 LAB — CBC
HCT: 37.4 % — ABNORMAL LOW (ref 39.0–52.0)
Hemoglobin: 13.2 g/dL (ref 13.0–17.0)
MCH: 32.3 pg (ref 26.0–34.0)
Platelets: 197 10*3/uL (ref 150–400)
RBC: 4.09 MIL/uL — ABNORMAL LOW (ref 4.22–5.81)
RDW: 13.4 % (ref 11.5–15.5)

## 2010-12-14 LAB — PROTIME-INR: Prothrombin Time: 28.8 seconds — ABNORMAL HIGH (ref 11.6–15.2)

## 2010-12-15 LAB — BASIC METABOLIC PANEL
BUN: 21 mg/dL (ref 6–23)
CO2: 29 mEq/L (ref 19–32)
Calcium: 8.5 mg/dL (ref 8.4–10.5)
Chloride: 103 mEq/L (ref 96–112)
Creatinine, Ser: 1.44 mg/dL — ABNORMAL HIGH (ref 0.50–1.35)

## 2010-12-15 LAB — CBC
HCT: 36.3 % — ABNORMAL LOW (ref 39.0–52.0)
MCH: 32.1 pg (ref 26.0–34.0)
MCV: 91.7 fL (ref 78.0–100.0)
Platelets: 200 10*3/uL (ref 150–400)
RDW: 13.2 % (ref 11.5–15.5)
WBC: 10.3 10*3/uL (ref 4.0–10.5)

## 2010-12-15 LAB — PROTIME-INR: Prothrombin Time: 29.4 seconds — ABNORMAL HIGH (ref 11.6–15.2)

## 2010-12-15 LAB — GLUCOSE, CAPILLARY
Glucose-Capillary: 187 mg/dL — ABNORMAL HIGH (ref 70–99)
Glucose-Capillary: 289 mg/dL — ABNORMAL HIGH (ref 70–99)

## 2010-12-16 LAB — BASIC METABOLIC PANEL
BUN: 22 mg/dL (ref 6–23)
CO2: 29 mEq/L (ref 19–32)
Chloride: 100 mEq/L (ref 96–112)
Creatinine, Ser: 1.51 mg/dL — ABNORMAL HIGH (ref 0.50–1.35)
Glucose, Bld: 238 mg/dL — ABNORMAL HIGH (ref 70–99)
Potassium: 3.4 mEq/L — ABNORMAL LOW (ref 3.5–5.1)

## 2010-12-17 LAB — CULTURE, BLOOD (ROUTINE X 2): Culture: NO GROWTH

## 2010-12-31 NOTE — Discharge Summary (Signed)
[name], [name] NO.:  [id number]  MEDICAL RECORD NO.:  [id number]  LOCATION:  4506                         FACILITY:  [facility]  PHYSICIAN:  Thad Ranger, MD       DATE OF BIRTH:  1935-09-15  DATE OF ADMISSION:  12/10/2010 DATE OF DISCHARGE:                        DISCHARGE SUMMARY - REFERRING   PRIMARY CARE PHYSICIAN:  Dr. Margaretmary Bayley.  UROLOGIST:  Dr. Allison Quarry.  DISCHARGE DIAGNOSES: 1. Diabetic ketoacidosis, resolved. 2. Diabetes type 2, insulin dependent, uncontrolled. 3. Atrial fibrillation with flutter with rapid ventricular response,     now rate controlled on Coumadin. 4. Dehydration, improved. 5. [name] 6. Hypertension. 7. Acute renal failure on chronic kidney disease stage II. 8. Enterococcus urinary tract infection. 9. Obstructive sleep apnea. 10.Hyponatremia likely due to diabetic ketoacidosis, improved.  DISCHARGE MEDICATIONS: 1. Ampicillin 500 mg p.o. t.i.d. for 7 days more to complete the     course. 2. Amlodipine 10 mg p.o. daily. 3. NovoLog insulin sliding scale resistant 1-20 units. 4. NovoLog mix 70/30, 40 units subcu b.i.d. with meals. 5. Aspirin 81 mg daily. 6. Coreg 12.5 mg p.o. b.i.d. 7. Lasix 80 mg p.o. daily. 8. Hydrocortisone 20 mg p.o. b.i.d. 9. Ketoconazole 200 mg p.o. t.i.d. 10.Warfarin 5 mg half to one tablet p.o. daily, one tablet on Tuesday,     Thursday, and Friday and half tablet on other days.  The patient is     currently advised to hold the following medication until he follows     up with his primary care physician, Dr. Margaretmary Bayley.  The following medications on hold are: 1. Spironolactone 25 mg daily. 2. The patient's prior insulin schedule. 3. Ibuprofen. 4. Exforge (amlodipine/Valsartan). 5. Hydralazine.  BRIEF HISTORY OF PRESENT ILLNESS:  At the time of admission, Vincent [name] is a 75 year old male with a history of diabetes, hypertension, COPD, history of diastolic heart failure  and EF of 40% to 45%, prostate cancer, presented with elevated blood sugars.  The patient also is on hydrocortisone and ketoconazole and under Dr. Allison Quarry with Urology for prostate cancer.  The patient had gone to see his urologist for regular follow-up and was called by Dr. Orvilla Cornwall office instructing the patient to go to the emergency department as his blood sugars were greater than 800.  The patient also reported that he had run out of his insulin and was not taking his 70/30 NovoLog.  The patient was admitted for DKA.  RADIOLOGICAL DATA:  Echocardiogram August 28 showed normal wall motion. No regional wall motion maladies.  PERTINENT LAB DIAGNOSTIC DATA:  Sodium 128 at the time of admission with a glucose more than 700, creatinine 2.1 with a BUN of 31, bicarb of 19. CMP was obtained which showed sodium of 125, glucose of 835, creatinine of 2.08 with a BUN of 29.  UA was positive for UTI.  HbA1c showed 10.9 with a mean plasma glucose of 266.  Lactic acid elevated at 2.9. Procalcitonin less than 0.1.  Blood cultures remain negative till date. Urine culture showed Enterococcus species more than 100,000 sensitive to ampicillin, nitrofurantoin, vancomycin and levofloxacin.  BMET at the time of discharge showed improved creatinine  to 1.5, BUN 22, sodium 140, potassium 3.4.  Potassium was replaced at the time of discharge.  BRIEF HOSPITALIZATION COURSE:  [name] is a 75 year old male who was admitted with DKA. 1. Diabetic ketoacidosis.  The patient had anion gap of 15 and he was     started on insulin drip.  Blood sugar slightly elevated due to     steroids and the patient had run out of his insulin.  The patient     was transitioned to subcu insulin.  I have placed him for now on     NovoLog 70/30 40 units b.i.d. with sliding scale insulin.  The     patient will have home health RN for follow-up as well regarding     his medications.  He was instructed closely to keep his blood sugar      logs and to follow up with his primary care physician, Dr. Margaretmary Bayley, within next week.  His insulin regimen may need to be     adjusted at that point. 2. Atrial fibrillation with rapid ventricular rate, now rate     controlled.  The patient was placed back on Coreg and he was also     started on diltiazem drip.  I suspect that the patient may have     been tested due to his atrial fibrillation, due to dehydration from     diabetic ketoacidosis. 3. Dehydration.  The patient was aggressively hydrated with IV fluids.     However, he does have a history of diastolic heart failure.  Once     he was volume repleted, the patient was placed back on Lasix and     spironolactone.  His kidney function has improved to 1.5 from 2.0     at the time of admission.  However, at this time, I have placed     hold on the ACE inhibitor and spironolactone.  The patient will     follow up with his primary care physician, Dr. Margaretmary Bayley, next     week and adjust his medication.  He should get a repeat renal panel     at the time of the follow-up. 4. Hyponatremia likely due to hyperosmolarity from the hyperglycemia     and DKA, this has resolved.  Sodium is 140 at the time of     discharge.  PHYSICAL EXAMINATION:  VITAL SIGNS:  At the time of discharge, BP 142/78, temperature 97.1, pulse 70, respirations 20, O2 sats 98% on room air. GENERAL:  The patient is alert, awake and oriented x3, not in acute distress. HEENT:  Conjunctivae pale.  Pupils reactive to light and accommodation. EOMI. NECK:  Supple.  No lymphadenopathy.  No JVD. CVS:  S1, S2 clear. CHEST:  Fairly clear to auscultation bilaterally. ABDOMEN:  Obese, nontender, nondistended. EXTREMITIES:  Pedal edema present.  DISCHARGE FOLLOW-UP:  With Dr. Margaretmary Bayley within next week.  The patient was instructed to follow up for the med adjustments and lab functions and follow-up.  Please note that the patient's insulin regimen needs to  be adjusted and also please check the renal panel at Dr. [name] office.  DISCHARGE TIME:  35 minutes.     Thad Ranger, MD     RR/MEDQ  D:  12/16/2010  T:  12/16/2010  Job:  [id number]  cc:   Margaretmary Bayley, M.D. Dr. Allison Quarry  Electronically Signed by Andres Labrum RAI  on 12/31/2010 03:43:26 PM

## 2011-01-04 LAB — URINALYSIS, ROUTINE W REFLEX MICROSCOPIC
Hgb urine dipstick: NEGATIVE
Nitrite: NEGATIVE
Specific Gravity, Urine: 1.009
Urobilinogen, UA: 0.2
pH: 6.5

## 2011-01-04 LAB — DIFFERENTIAL
Basophils Absolute: 0.1
Basophils Relative: 0
Basophils Relative: 1
Eosinophils Absolute: 0
Eosinophils Relative: 0
Lymphs Abs: 1.2
Monocytes Absolute: 0.2
Monocytes Absolute: 0.6
Monocytes Relative: 2 — ABNORMAL LOW
Neutro Abs: 10.1 — ABNORMAL HIGH
Neutrophils Relative %: 87 — ABNORMAL HIGH

## 2011-01-04 LAB — LIPID PANEL
Cholesterol: 207 — ABNORMAL HIGH
Total CHOL/HDL Ratio: 5.8

## 2011-01-04 LAB — BASIC METABOLIC PANEL
BUN: 16
BUN: 20
BUN: 23
BUN: 27 — ABNORMAL HIGH
CO2: 27
CO2: 29
CO2: 33 — ABNORMAL HIGH
CO2: 32
Calcium: 9
Calcium: 9.4
Chloride: 100
Creatinine, Ser: 1.38
GFR calc non Af Amer: 44 — ABNORMAL LOW
GFR calc non Af Amer: 46 — ABNORMAL LOW
Glucose, Bld: 108 — ABNORMAL HIGH
Glucose, Bld: 247 — ABNORMAL HIGH
Glucose, Bld: 256 — ABNORMAL HIGH
Glucose, Bld: 188 — ABNORMAL HIGH
Potassium: 3.4 — ABNORMAL LOW
Potassium: 4.2
Sodium: 138
Sodium: 140
Sodium: 141

## 2011-01-04 LAB — POCT I-STAT 3, VENOUS BLOOD GAS (G3P V)
Bicarbonate: 30.3 — ABNORMAL HIGH
Operator id: 282671
TCO2: 32
pCO2, Ven: 42.5 — ABNORMAL LOW
pH, Ven: 7.461 — ABNORMAL HIGH

## 2011-01-04 LAB — COMPREHENSIVE METABOLIC PANEL
Albumin: 4.1
Alkaline Phosphatase: 85
BUN: 28 — ABNORMAL HIGH
CO2: 24
Chloride: 102
Creatinine, Ser: 1.57 — ABNORMAL HIGH
GFR calc non Af Amer: 44 — ABNORMAL LOW
Glucose, Bld: 322 — ABNORMAL HIGH
Potassium: 3.7
Total Bilirubin: 1.3 — ABNORMAL HIGH

## 2011-01-04 LAB — CBC
HCT: 36.6 — ABNORMAL LOW
HCT: 38.2 — ABNORMAL LOW
HCT: 39.3
HCT: 42
HCT: 38.5 — ABNORMAL LOW
Hemoglobin: 12.7 — ABNORMAL LOW
Hemoglobin: 13.3
Hemoglobin: 13.4
Hemoglobin: 14.3
Hemoglobin: 13.1
MCHC: 33.8
MCHC: 35.1
MCHC: 35.1
MCHC: 34.1
MCV: 93.2
MCV: 93.5
MCV: 94.1
Platelets: 151
Platelets: 173
Platelets: 177
Platelets: 160
RBC: 4.1 — ABNORMAL LOW
RBC: 4.49
RDW: 13.5
RDW: 13.7
RDW: 13.9
RDW: 13.9
WBC: 11.8 — ABNORMAL HIGH
WBC: 13.1 — ABNORMAL HIGH

## 2011-01-04 LAB — TSH: TSH: 2.92

## 2011-01-04 LAB — B-NATRIURETIC PEPTIDE (CONVERTED LAB): Pro B Natriuretic peptide (BNP): 394 — ABNORMAL HIGH

## 2011-01-04 LAB — TROPONIN I
Troponin I: 0.04
Troponin I: 0.05

## 2011-01-04 LAB — CK TOTAL AND CKMB (NOT AT ARMC)
CK, MB: 3.6
CK, MB: 5.1 — ABNORMAL HIGH
Relative Index: 1.2
Total CK: 423 — ABNORMAL HIGH
Total CK: 424 — ABNORMAL HIGH

## 2011-01-04 LAB — POCT I-STAT 3, ART BLOOD GAS (G3+)
O2 Saturation: 94
pCO2 arterial: 40
pH, Arterial: 7.46 — ABNORMAL HIGH
pO2, Arterial: 65 — ABNORMAL LOW

## 2011-01-04 LAB — PROTIME-INR
INR: 1.1
Prothrombin Time: 13.1

## 2011-01-04 LAB — POCT CARDIAC MARKERS
CKMB, poc: 3.2
Operator id: 198171

## 2011-01-04 LAB — APTT: aPTT: 23 — ABNORMAL LOW

## 2011-01-04 LAB — T4, FREE: Free T4: 1.28

## 2011-01-04 LAB — MAGNESIUM: Magnesium: 2

## 2011-01-04 LAB — HEPARIN LEVEL (UNFRACTIONATED): Heparin Unfractionated: 1.04 — ABNORMAL HIGH

## 2011-01-21 LAB — URINALYSIS, ROUTINE W REFLEX MICROSCOPIC
Glucose, UA: >1000 — AB
Protein, ur: 30 — AB
Specific Gravity, Urine: 1.027
Urobilinogen, UA: 0.2

## 2011-01-21 LAB — DIFFERENTIAL
Eosinophils Absolute: 0.3
Lymphs Abs: 2.7
Monocytes Absolute: 0.5
Monocytes Relative: 5
Neutrophils Relative %: 67

## 2011-01-21 LAB — COMPREHENSIVE METABOLIC PANEL
ALT: 69 — ABNORMAL HIGH
AST: 69 — ABNORMAL HIGH
Albumin: 4.2
Calcium: 9.3
GFR calc Af Amer: 33 — ABNORMAL LOW
Glucose, Bld: 481 — ABNORMAL HIGH
Potassium: 3.8
Sodium: 131 — ABNORMAL LOW
Total Protein: 7.5

## 2011-01-21 LAB — CBC
MCHC: 34.5
Platelets: 177
RDW: 13

## 2011-01-21 LAB — URINE MICROSCOPIC-ADD ON

## 2011-01-21 LAB — POCT CARDIAC MARKERS: Myoglobin, poc: 177

## 2011-01-23 ENCOUNTER — Inpatient Hospital Stay (HOSPITAL_COMMUNITY)
Admission: EM | Admit: 2011-01-23 | Discharge: 2011-01-28 | DRG: 193 | Disposition: A | Discharge status: Home Health | Payer: Medicare Other | Source: Ambulatory Visit | Attending: Internal Medicine | Admitting: Internal Medicine

## 2011-01-23 ENCOUNTER — Emergency Department (HOSPITAL_COMMUNITY): Payer: Medicare Other

## 2011-01-23 DIAGNOSIS — N183 Chronic kidney disease, stage 3 unspecified: Secondary | ICD-10-CM | POA: Diagnosis present

## 2011-01-23 DIAGNOSIS — I509 Heart failure, unspecified: Secondary | ICD-10-CM | POA: Diagnosis present

## 2011-01-23 DIAGNOSIS — Z794 Long term (current) use of insulin: Secondary | ICD-10-CM

## 2011-01-23 DIAGNOSIS — Z79899 Other long term (current) drug therapy: Secondary | ICD-10-CM

## 2011-01-23 DIAGNOSIS — N390 Urinary tract infection, site not specified: Secondary | ICD-10-CM | POA: Diagnosis present

## 2011-01-23 DIAGNOSIS — Z8546 Personal history of malignant neoplasm of prostate: Secondary | ICD-10-CM

## 2011-01-23 DIAGNOSIS — I4891 Unspecified atrial fibrillation: Secondary | ICD-10-CM | POA: Diagnosis present

## 2011-01-23 DIAGNOSIS — N179 Acute kidney failure, unspecified: Secondary | ICD-10-CM | POA: Diagnosis present

## 2011-01-23 DIAGNOSIS — Z7901 Long term (current) use of anticoagulants: Secondary | ICD-10-CM

## 2011-01-23 DIAGNOSIS — M109 Gout, unspecified: Secondary | ICD-10-CM | POA: Diagnosis present

## 2011-01-23 DIAGNOSIS — G4733 Obstructive sleep apnea (adult) (pediatric): Secondary | ICD-10-CM | POA: Diagnosis present

## 2011-01-23 DIAGNOSIS — I5033 Acute on chronic diastolic (congestive) heart failure: Secondary | ICD-10-CM | POA: Diagnosis present

## 2011-01-23 DIAGNOSIS — I129 Hypertensive chronic kidney disease with stage 1 through stage 4 chronic kidney disease, or unspecified chronic kidney disease: Secondary | ICD-10-CM | POA: Diagnosis present

## 2011-01-23 DIAGNOSIS — J189 Pneumonia, unspecified organism: Principal | ICD-10-CM | POA: Diagnosis present

## 2011-01-23 DIAGNOSIS — Z7982 Long term (current) use of aspirin: Secondary | ICD-10-CM

## 2011-01-23 DIAGNOSIS — E119 Type 2 diabetes mellitus without complications: Secondary | ICD-10-CM | POA: Diagnosis present

## 2011-01-23 DIAGNOSIS — E785 Hyperlipidemia, unspecified: Secondary | ICD-10-CM | POA: Diagnosis present

## 2011-01-23 DIAGNOSIS — E876 Hypokalemia: Secondary | ICD-10-CM | POA: Diagnosis present

## 2011-01-23 LAB — COMPREHENSIVE METABOLIC PANEL
ALT: 38 U/L (ref 0–53)
AST: 35 U/L (ref 0–37)
Albumin: 3.6 g/dL (ref 3.5–5.2)
Alkaline Phosphatase: 109 U/L (ref 39–117)
BUN: 28 mg/dL — ABNORMAL HIGH (ref 6–23)
Chloride: 98 mEq/L (ref 96–112)
Potassium: 3.3 mEq/L — ABNORMAL LOW (ref 3.5–5.1)
Sodium: 139 mEq/L (ref 135–145)
Total Bilirubin: 0.9 mg/dL (ref 0.3–1.2)

## 2011-01-23 LAB — POCT I-STAT, CHEM 8
BUN: 33 mg/dL — ABNORMAL HIGH (ref 6–23)
Calcium, Ion: 1.06 mmol/L — ABNORMAL LOW (ref 1.12–1.32)
Creatinine, Ser: 1.9 mg/dL — ABNORMAL HIGH (ref 0.50–1.35)
TCO2: 26 mmol/L (ref 0–100)

## 2011-01-23 LAB — URINALYSIS, ROUTINE W REFLEX MICROSCOPIC
Glucose, UA: NEGATIVE mg/dL
Specific Gravity, Urine: 1.023 (ref 1.005–1.030)
Urobilinogen, UA: 1 mg/dL (ref 0.0–1.0)

## 2011-01-23 LAB — DIFFERENTIAL
Basophils Relative: 0 % (ref 0–1)
Eosinophils Absolute: 0.5 10*3/uL (ref 0.0–0.7)
Monocytes Absolute: 1.1 10*3/uL — ABNORMAL HIGH (ref 0.1–1.0)
Monocytes Relative: 9 % (ref 3–12)

## 2011-01-23 LAB — CK TOTAL AND CKMB (NOT AT ARMC): Total CK: 1026 U/L — ABNORMAL HIGH (ref 7–232)

## 2011-01-23 LAB — CARDIAC PANEL(CRET KIN+CKTOT+MB+TROPI)
CK, MB: 7 ng/mL (ref 0.3–4.0)
Relative Index: 0.6 (ref 0.0–2.5)
Total CK: 1125 U/L — ABNORMAL HIGH (ref 7–232)
Troponin I: <0.3 ng/mL (ref <0.30)

## 2011-01-23 LAB — MAGNESIUM: Magnesium: 2 mg/dL (ref 1.5–2.5)

## 2011-01-23 LAB — TSH: TSH: 2.419 u[IU]/mL (ref 0.350–4.500)

## 2011-01-23 LAB — POCT I-STAT TROPONIN I: Troponin i, poc: 0.16 ng/mL (ref 0.00–0.08)

## 2011-01-23 LAB — URINE MICROSCOPIC-ADD ON

## 2011-01-23 LAB — HEPARIN LEVEL (UNFRACTIONATED): Heparin Unfractionated: 0.13 IU/mL — ABNORMAL LOW (ref 0.30–0.70)

## 2011-01-23 LAB — CBC
MCH: 31.8 pg (ref 26.0–34.0)
MCHC: 34.2 g/dL (ref 30.0–36.0)
Platelets: 203 10*3/uL (ref 150–400)

## 2011-01-24 ENCOUNTER — Ambulatory Visit: Payer: Medicare Other | Admitting: *Deleted

## 2011-01-24 LAB — GLUCOSE, CAPILLARY
Glucose-Capillary: 306 mg/dL — ABNORMAL HIGH (ref 70–99)
Glucose-Capillary: 197 mg/dL — ABNORMAL HIGH (ref 70–99)

## 2011-01-24 LAB — BASIC METABOLIC PANEL
BUN: 23 mg/dL (ref 6–23)
Calcium: 8.8 mg/dL (ref 8.4–10.5)
GFR calc Af Amer: 55 mL/min — ABNORMAL LOW (ref >90)
GFR calc non Af Amer: 47 mL/min — ABNORMAL LOW (ref >90)
Glucose, Bld: 298 mg/dL — ABNORMAL HIGH (ref 70–99)
Sodium: 137 mEq/L (ref 135–145)

## 2011-01-24 LAB — CARDIAC PANEL(CRET KIN+CKTOT+MB+TROPI): CK, MB: 5.8 ng/mL — ABNORMAL HIGH (ref 0.3–4.0)

## 2011-01-24 LAB — CBC
Hemoglobin: 12 g/dL — ABNORMAL LOW (ref 13.0–17.0)
MCH: 30.9 pg (ref 26.0–34.0)
Platelets: 180 10*3/uL (ref 150–400)
RBC: 3.88 MIL/uL — ABNORMAL LOW (ref 4.22–5.81)

## 2011-01-24 LAB — PROTIME-INR
INR: 1.68 — ABNORMAL HIGH (ref 0.00–1.49)
Prothrombin Time: 20.1 seconds — ABNORMAL HIGH (ref 11.6–15.2)

## 2011-01-24 LAB — MAGNESIUM: Magnesium: 1.9 mg/dL (ref 1.5–2.5)

## 2011-01-25 ENCOUNTER — Inpatient Hospital Stay (HOSPITAL_COMMUNITY): Payer: Medicare Other

## 2011-01-25 LAB — PROTIME-INR
INR: 1.85 — ABNORMAL HIGH (ref 0.00–1.49)
Prothrombin Time: 21.7 seconds — ABNORMAL HIGH (ref 11.6–15.2)

## 2011-01-25 LAB — CBC
Platelets: 193 10*3/uL (ref 150–400)
RBC: 3.87 MIL/uL — ABNORMAL LOW (ref 4.22–5.81)
RDW: 13.3 % (ref 11.5–15.5)
WBC: 10.6 10*3/uL — ABNORMAL HIGH (ref 4.0–10.5)

## 2011-01-25 LAB — BASIC METABOLIC PANEL
BUN: 25 mg/dL — ABNORMAL HIGH (ref 6–23)
Calcium: 9.1 mg/dL (ref 8.4–10.5)
Creatinine, Ser: 1.77 mg/dL — ABNORMAL HIGH (ref 0.50–1.35)
GFR calc non Af Amer: 36 mL/min — ABNORMAL LOW (ref >90)
Glucose, Bld: 157 mg/dL — ABNORMAL HIGH (ref 70–99)

## 2011-01-25 LAB — GLUCOSE, CAPILLARY: Glucose-Capillary: 236 mg/dL — ABNORMAL HIGH (ref 70–99)

## 2011-01-25 LAB — HEPARIN LEVEL (UNFRACTIONATED): Heparin Unfractionated: 0.5 IU/mL (ref 0.30–0.70)

## 2011-01-26 LAB — GLUCOSE, CAPILLARY
Glucose-Capillary: 155 mg/dL — ABNORMAL HIGH (ref 70–99)
Glucose-Capillary: 297 mg/dL — ABNORMAL HIGH (ref 70–99)
Glucose-Capillary: 106 mg/dL — ABNORMAL HIGH (ref 70–99)

## 2011-01-26 LAB — BASIC METABOLIC PANEL
CO2: 25 mEq/L (ref 19–32)
Calcium: 9.6 mg/dL (ref 8.4–10.5)
Creatinine, Ser: 1.74 mg/dL — ABNORMAL HIGH (ref 0.50–1.35)
GFR calc non Af Amer: 37 mL/min — ABNORMAL LOW (ref >90)
Glucose, Bld: 189 mg/dL — ABNORMAL HIGH (ref 70–99)

## 2011-01-27 LAB — BASIC METABOLIC PANEL WITH GFR
BUN: 19 mg/dL (ref 6–23)
CO2: 26 meq/L (ref 19–32)
Calcium: 9.7 mg/dL (ref 8.4–10.5)
Chloride: 101 meq/L (ref 96–112)
Creatinine, Ser: 1.76 mg/dL — ABNORMAL HIGH (ref 0.50–1.35)
GFR calc Af Amer: 42 mL/min — ABNORMAL LOW
GFR calc non Af Amer: 37 mL/min — ABNORMAL LOW
Glucose, Bld: 124 mg/dL — ABNORMAL HIGH (ref 70–99)
Potassium: 3.7 meq/L (ref 3.5–5.1)
Sodium: 139 meq/L (ref 135–145)

## 2011-01-27 LAB — GLUCOSE, CAPILLARY
Glucose-Capillary: 111 mg/dL — ABNORMAL HIGH (ref 70–99)
Glucose-Capillary: 217 mg/dL — ABNORMAL HIGH (ref 70–99)
Glucose-Capillary: 140 mg/dL — ABNORMAL HIGH (ref 70–99)

## 2011-01-27 LAB — CBC
MCH: 30.6 pg (ref 26.0–34.0)
MCHC: 32.9 g/dL (ref 30.0–36.0)
MCV: 93.1 fL (ref 78.0–100.0)
Platelets: 235 10*3/uL (ref 150–400)
RBC: 4.18 MIL/uL — ABNORMAL LOW (ref 4.22–5.81)
RDW: 13.6 % (ref 11.5–15.5)

## 2011-01-28 ENCOUNTER — Inpatient Hospital Stay (HOSPITAL_COMMUNITY): Payer: Medicare Other

## 2011-01-28 LAB — BASIC METABOLIC PANEL
BUN: 18 mg/dL (ref 6–23)
Calcium: 9.9 mg/dL (ref 8.4–10.5)
GFR calc non Af Amer: 32 mL/min — ABNORMAL LOW (ref >90)
Glucose, Bld: 151 mg/dL — ABNORMAL HIGH (ref 70–99)
Sodium: 140 mEq/L (ref 135–145)

## 2011-01-28 LAB — CBC
MCH: 31.6 pg (ref 26.0–34.0)
MCV: 93.8 fL (ref 78.0–100.0)
Platelets: 219 10*3/uL (ref 150–400)
RDW: 13.7 % (ref 11.5–15.5)
WBC: 11.2 10*3/uL — ABNORMAL HIGH (ref 4.0–10.5)

## 2011-01-31 NOTE — Discharge Summary (Signed)
Vincent [name], Vincent [name] NO.:  [id number]  MEDICAL RECORD NO.:  [id number]  LOCATION:  4703                         FACILITY:  [facility]  PHYSICIAN:  Zannie Cove, MD     DATE OF BIRTH:  Mar 02, 1936  DATE OF ADMISSION:  01/23/2011 DATE OF DISCHARGE:  01/28/2011                              DISCHARGE SUMMARY   PRIMARY CARE PHYSICIAN:  Dr.  Margaretmary Bayley.  UROLOGIST:  Dr. Tiburcio Pea.  CARDIOLOGIST:  Dr. Rennis Golden with [facility].  DISCHARGE DIAGNOSES: 1. Healthcare-associated pneumonia. 2. Diastolic congestive heart failure. 3. Paroxysmal atrial fibrillation, converted to normal sinus rhythm,     on Coumadin. 4. [name] 5. Hypertension. 6. Chronic kidney disease stage III. 7. Suspected obstructive sleep apnea. 8. Type 2 diabetes.  DISCHARGE MEDICATIONS ARE AS FOLLOWS: 1. Levofloxacin 750 mg p.o. daily for 5 days. 2. Colchicine dose decreased to 0.6 mg daily. 3. Lasix dose changed to 40 mg p.o. daily. 4. Potassium chloride, also does change to 1 tablespoon daily, 10%     liquid (20 mEq per 15 mL). 5. Coumadin, also the patient instructed not to take Coumadin tonight     and tomorrow, October 16, and then based on INR, October 17. 6. Tylenol 500 mg 2 tablets p.o. q.6 hours p.r.n. 7. Enteric-coated aspirin 81 mg daily. 8. Coreg 18.75 mg p.o. b.i.d. 9. Hydrocortisone 20 mg p.o. b.i.d. 10.NovoLog sliding scale 3 times a day with meals. 11.Insulin 70/30 40 units in the evening and 55 units in the morning. 12.Ketoconazole 200 mg p.o. t.i.d. 13.Vitamin C over the counter 1 tab daily.  DISCONTINUED MEDICATIONS:  Amlodipine 10 mg daily.  CONSULTANTS:  Dr. Rennis Golden and Dr. Allyson Sabal with [facility] and V[facility].  DIAGNOSTIC INVESTIGATIONS:  Chest x-ray on October, 2010, showed right lower lobe consolidation concerning for pneumonia.  Repeat chest x-ray on October 15; minimal change since prior exam, subtle density the  right medial lung base could represent a small focus of infection versus atelectasis.  HOSPITAL COURSE:  Mr. Tillett is a very pleasant 75 year old gentleman with history of diastolic heart failure,  paroxysmal AFib, presented to the hospital with a 4-day history of chest congestion and cough associated with lower extremity swelling and weakness.  On evaluation, he was found to have healthcare-associated pneumonia, based on the fact that he was hospitalized less than 2 months ago with DKA.  1. For his healthcare-associated pneumonia, he was started on broad-     spectrum antibiotics namely vancomycin and cefepime and Levaquin     which he received for the first 4 days, subsequently transitioned     to levofloxacin.  He has remained stable on this now.  We will     continue p.o. Levaquin for 5 more days to complete a 10-day course     of antibiotics.  Remains afebrile with resolving leukocytosis and     improving O2 sats for the last 48 hours. 2. Acute-on-chronic diastolic CHF, probably triggered by paroxysmal     AFib, as well as pneumonia.  He was diuresed with IV Lasix for     first day and half.  His troponin was felt to  be mildly elevated     which is felt to be from his heart failure exacerbation.  He was     followed by [facility] and [facility] through his hospital     course.  Currently improved, transitioned back to Coreg and Lasix. 3. Paroxysmal AFib.  He was in atrial fibrillation at the time of     admission, subsequently converted to sinus rhythm.  Initially on     admission he was subtherapeutic on his INR and did receive a     heparin bridge initially and then subsequently maintained on     Coumadin.  His INR at the time of discharge is 3.9 today and hence     he was instructed not to take Coumadin tonight and tomorrow night     and then have his INR checked on Wednesday at [facility]     and Vascular, and then resume Coumadin if appropriate at that  time. 4. Obstructive sleep apnea.  He had a sleep study back in 2005 at that     time, he had moderate sleep apnea, however, for some reason, he was     never instructed to start using the CPAP machine.  Hence he is     going for repeat sleep study now since it has been about 7 years     since prior study.  This will be arranged through Dr. Blanchie Dessert     office and then will slowly need to start using the CPAP machine,     especially because of the paroxysmal AFib.  DISCHARGE CONDITION:  Stable.  DISCHARGE VITAL SIGNS:  Temperature is 97.4, pulse 62, blood pressure 132/79, respirations 18, and satting 96% room air.  LABS:  White count 11.2, hemoglobin 13.7, platelets 219.  Chemistries, sodium 140, potassium 3.7, chloride 102, bicarb 26, BUN 18, creatinine 1.9, glucose 151, and again INR is 3.9.  DISCHARGE FOLLOW UP: 1. INR check at [facility] and Vascular on Wednesday on     October 17. 2. Follow up with Dr. [place] Hilty in 7-10 days 3. Follow up with primary physician in 7-10 days as well.     Zannie Cove, MD     PJ/MEDQ  D:  01/28/2011  T:  01/28/2011  Job:  [id number]  cc:   Margaretmary Bayley, M.D. [place] Hilty, MD  Electronically Signed by Zannie Cove  on 01/31/2011 10:52:51 AM

## 2011-02-03 ENCOUNTER — Other Ambulatory Visit: Payer: Self-pay | Admitting: Internal Medicine

## 2011-02-11 NOTE — H&P (Signed)
Vincent [name], Vincent [name] NO.:  [id number]  MEDICAL RECORD NO.:  [id number]  LOCATION:  [facility]                         FACILITY:  [facility]  PHYSICIAN:  Mauro Kaufmann, MD         DATE OF BIRTH:  [birth year]  DATE OF ADMISSION:  01/23/2011 DATE OF DISCHARGE:                             HISTORY & PHYSICAL   CHIEF COMPLAINT:  Cough.  HISTORY OF PRESENT ILLNESS:  This is a 75 year old male with a history of CHF who came with a chief complaint of cough.  The patient provides the history, and as per the patient, he went for a fishing trip and spent 4 days over there.  The patient says that while he was coming back from the patient, he developed chest congestion and dry cough.  The patient also had left lower leg swelling around the ankle and foot.  The patient has a history of CHF and takes Lasix every day.  The patient states that he did not miss any medications while he was on the fishing trip.  He denies any fever or chills, or hemoptysis.  The patient denies any chest pain or abdominal pain.  No nausea, vomiting, or diarrhea. The patient says that his legs feel weak.  He also states that he has been coughing, but he is not able to cough up any phlegm.  PAST MEDICAL HISTORY: 1. Congestive heart failure. 2. Diabetes mellitus. 3. [name] 4. History of atrial fibrillation on chronic anticoagulation. 5. History of diastolic heart failure with ejection fraction of 45% to     50%. 6. Chronic kidney disease stage II.  SOCIAL HISTORY:  The patient quit drinking alcohol about 30 years ago. He denies any tobacco abuse.  No history of illicit drug abuse.  CURRENT HOME MEDICATIONS: 1. NovoLog 70/30, 40 units in the evening and 55 units in the morning. 2. NovoLog sliding scale 1-20 units 3 times a day with meals. 3. Vitamin C over-the-counter 1 tab p.o. daily. 4. Potassium chloride 20 mEq p.o. b.i.d. 5. Colcrys 0.6 mg p.o. b.i.d. 6. Coreg 12.5 mg p.o. 1.5  tablets twice a day. 7. Amlodipine 10 mg 1 tablet p.o. daily. 8. Furosemide 80 mg p.o. daily. 9. Warfarin 0.5 mg 0.5-1 tablet p.o. daily. 10.Aspirin enteric-coated 81 mg p.o. daily. 11.Acetaminophen 500 mg 2 tablets every 6 hours as needed. 12.Hydrocortisone 20 mg p.o. twice a day. 13.Ketoconazole 200 mg p.o. 3 times a day.  REVIEW OF SYSTEMS:  HEENT:  There is no headache, no blurred vision. NECK:  No history of thyroid problems.  CHEST:  There is questionable history of obstructive sleep apnea which the patient is not aware of. HEART:  Past history of CHF.  The patient has been seen by [facility] as an outpatient.  NEUROLOGIC:  There is no history of stroke or TIA in the past.  No history of seizures.  PHYSICAL EXAMINATION:  VITAL SIGNS:  The patient's blood pressure is 137/74, temp 98.8, pulse 74, and respirations 20. HEENT:  Head is normocephalic. EYES:  Extraocular intact.  Oral mucosa is pink and moist. NECK:  Supple. CHEST:  Bilateral rhonchi auscultated in  both the lower lung fields. HEART:  S1 and S2 is regular in rate and rhythm. ABDOMEN:  Soft and nontender.  No organomegaly. EXTREMITIES:  1+ pitting edema noted in the left lower extremity and trace edema noted in the right lower extremity.  There is no calf tenderness noted at this time in both the lower extremities.  PERTINENT IMAGING STUDIES:  The chest x-ray shows bronchitic changes and right lower lobe consolidation concerning for pneumonia.  PERTINENT LABS:  WBC is 12.0, hemoglobin is 12.9, hematocrit 38.0, and platelet count of 203.  Sodium is 139, potassium is 3.0, chloride is 103, BUN is 33, and creatinine 1.90.  CK is 1096, troponin is 0.16.  BNP is 1382.  ASSESSMENT: 1. Congestive heart failure exacerbation. 2. Hospital-acquired pneumonia. 3. Diabetes mellitus. 4. History of atrial fibrillation. 5. [name]  PLAN OF TREATMENT: 1. CHF exacerbation.  The patient will  be admitted to the hospital.     We will start the patient on IV Lasix 40 mg q.12 h. and also cycle     3 sets of cardiac enzymes.  The patient's troponin is elevated     which could be secondary to CHF exacerbation.  The patient's EKG     shows sinus rhythm and left axis deviation, with borderline T-wave     abnormalities in the anterolateral leads.  The EKG is very similar     to the EKG in the past.  A [facility]     consultation has been obtained by the ER physician. 2. Healthcare-associated pneumonia.  The patient was hospitalized in     September and is coming back to the hospital within a 34-month time     frame so will be started on vancomycin, cefepime, and Levaquin for     the healthcare-associated pneumonia.  We will follow the patient's     clinical course and determine the length of treatment with     antibiotics. 3. Diabetes mellitus.  The patient will be put on a sliding scale of     insulin along with the Humulin 70/30. 4. History of atrial fibrillation.  The patient will be started on     warfarin which will be continued. 5. [name]  The patient will continue his     medications including ketoconazole for the prostate cancer. 6. History of gout.  We will continue the patient on colchicine. 7. Hypokalemia.  The patient's potassium will be replaced.  We will     also check the serum magnesium level and we will follow the     patient's CBC and BMET in the morning.     Mauro Kaufmann, MD     GL/MEDQ  D:  01/23/2011  T:  01/23/2011  Job:  [id number]  Electronically Signed by Mauro Kaufmann  on 02/11/2011 09:51:48 AM
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
## 2011-02-21 ENCOUNTER — Ambulatory Visit (HOSPITAL_COMMUNITY)
Admission: RE | Admit: 2011-02-21 | Discharge: 2011-02-21 | Disposition: A | Discharge status: Home / Self Care | Payer: Medicare Other | Source: Ambulatory Visit | Attending: Internal Medicine | Admitting: Internal Medicine

## 2011-02-21 ENCOUNTER — Other Ambulatory Visit: Payer: Self-pay | Admitting: Internal Medicine

## 2011-02-21 DIAGNOSIS — J189 Pneumonia, unspecified organism: Secondary | ICD-10-CM

## 2011-02-21 DIAGNOSIS — R059 Cough, unspecified: Secondary | ICD-10-CM | POA: Insufficient documentation

## 2011-02-21 DIAGNOSIS — R05 Cough: Secondary | ICD-10-CM | POA: Insufficient documentation

## 2011-02-21 DIAGNOSIS — Z09 Encounter for follow-up examination after completed treatment for conditions other than malignant neoplasm: Secondary | ICD-10-CM | POA: Insufficient documentation

## 2011-03-02 ENCOUNTER — Emergency Department (INDEPENDENT_AMBULATORY_CARE_PROVIDER_SITE_OTHER): Payer: Medicare Other

## 2011-03-02 ENCOUNTER — Emergency Department (INDEPENDENT_AMBULATORY_CARE_PROVIDER_SITE_OTHER)
Admission: EM | Admit: 2011-03-02 | Discharge: 2011-03-02 | Disposition: A | Discharge status: Home / Self Care | Payer: Medicare Other | Source: Home / Self Care | Attending: Family Medicine | Admitting: Family Medicine

## 2011-03-02 ENCOUNTER — Encounter: Payer: Self-pay | Admitting: *Deleted

## 2011-03-02 DIAGNOSIS — I509 Heart failure, unspecified: Secondary | ICD-10-CM

## 2011-03-02 HISTORY — DX: Essential (primary) hypertension: I10

## 2011-03-02 HISTORY — DX: Heart failure, unspecified: I50.9

## 2011-03-02 HISTORY — DX: Cardiac arrhythmia, unspecified: I49.9

## 2011-03-02 HISTORY — DX: Malignant neoplasm of prostate: C61

## 2011-03-02 HISTORY — DX: Pneumonia, unspecified organism: J18.9

## 2011-03-02 LAB — POCT I-STAT, CHEM 8
Calcium, Ion: 1.18 mmol/L (ref 1.12–1.32)
Chloride: 106 mEq/L (ref 96–112)
Glucose, Bld: 132 mg/dL — ABNORMAL HIGH (ref 70–99)
HCT: 39 % (ref 39.0–52.0)
Hemoglobin: 13.3 g/dL (ref 13.0–17.0)
TCO2: 27 mmol/L (ref 0–100)

## 2011-03-02 NOTE — ED Provider Notes (Signed)
History     CSN: [id number] Arrival date & time: 03/02/2011  9:59 AM   First MD Initiated Contact with Patient 03/02/11 1033      Chief Complaint  Patient presents with  . Shortness of Breath    pt with c/o increased sob x approx week - seen own md office wednesday lasix increased to 80mg  qd - pt with recently hospital chf/pneumonia - increased sob with coughing -   . Cough    (Consider location/radiation/quality/duration/timing/severity/associated sxs/prior treatment) Patient is a 75 y.o. male presenting with shortness of breath and cough. The history is provided by the patient and a relative.  Shortness of Breath  The current episode started yesterday. The problem has been gradually worsening. The problem is mild. The symptoms are relieved by rest. Associated symptoms include cough and shortness of breath. Pertinent negatives include no chest pain, no chest pressure, no fever and no wheezing. He is currently using steroids. He has had prior hospitalizations. His past medical history does not include asthma. Past medical history comments: chf, recent hosp d/c ..  Cough Associated symptoms include shortness of breath. Pertinent negatives include no chest pain, no chills and no wheezing. His past medical history does not include asthma. Past medical history comments: chf, recent hosp d/c ..    Past Medical History  Diagnosis Date  . Diabetes mellitus   . Hypertension   . CHF (congestive heart failure)   . Cancer   . Pneumonia   . Irregular heartbeat   . Prostate cancer     No past surgical history on file.  No family history on file.  History  Substance Use Topics  . Smoking status: Not on file  . Smokeless tobacco: Not on file  . Alcohol Use:       Review of Systems  Constitutional: Positive for activity change and fatigue. Negative for fever and chills.  Respiratory: Positive for cough and shortness of breath. Negative for choking, chest tightness and wheezing.     Cardiovascular: Positive for leg swelling. Negative for chest pain and palpitations.  Genitourinary: Negative.     Allergies  Review of patient's allergies indicates no known allergies.  Home Medications   Current Outpatient Rx  Name Route Sig Dispense Refill  . ALLOPURINOL 300 MG PO TABS Oral Take 300 mg by mouth daily.      Marland Kitchen AMLODIPINE BESYLATE 10 MG PO TABS Oral Take 10 mg by mouth daily.      Marland Kitchen CARVEDILOL 12.5 MG PO TABS Oral Take 18.75 mg by mouth 2 (two) times daily with a meal.      . FUROSEMIDE 80 MG PO TABS Oral Take 80 mg by mouth 1 day or 1 dose.      Marland Kitchen HYDROCORTISONE 10 MG PO TABS Oral Take 20 mg by mouth daily.      . INSULIN ASPART 100 UNIT/ML [place] SOLN Subcutaneous Inject into the skin 3 (three) times daily before meals.      . INSULIN ISOPHANE & REGULAR (70-30) 100 UNIT/ML [place] SUSP Subcutaneous Inject 55 Units into the skin daily with breakfast.      . INSULIN ISOPHANE & REGULAR (70-30) 100 UNIT/ML [place] SUSP Subcutaneous Inject 40 Units into the skin daily with supper.      Marland Kitchen KETOCONAZOLE 200 MG PO TABS Oral Take 200 mg by mouth 3 (three) times daily.      Marland Kitchen VITAMIN C 500 MG PO TABS Oral Take 500 mg by mouth daily.      Marland Kitchen  WARFARIN SODIUM 5 MG PO TABS Oral Take 5 mg by mouth daily.        BP 125/62  Pulse 64  Temp(Src) 99.3 F (37.4 C) (Oral)  Resp 30  SpO2 100%  Physical Exam  Constitutional: He is oriented to person, place, and time. He appears well-developed and well-nourished.  HENT:  Head: Normocephalic.  Eyes: Pupils are equal, round, and reactive to light.  Neck: Normal range of motion. Neck supple. No JVD present. No tracheal deviation present. No thyromegaly present.  Cardiovascular: Normal rate, regular rhythm, normal heart sounds and intact distal pulses.   Pulmonary/Chest: Effort normal and breath sounds normal. No stridor.  Abdominal: Soft. Bowel sounds are normal. He exhibits distension.  Musculoskeletal: He exhibits edema.  Lymphadenopathy:    He  has no cervical adenopathy.  Neurological: He is alert and oriented to person, place, and time.  Skin: Skin is warm and dry.    ED Course  Procedures (including critical care time)   Labs Reviewed  I-STAT, CHEM 8   No results found.   No diagnosis found.    MDM  X-rays reviewed and report per radiologist.        Barkley Bruns, MD 03/02/11 1155

## 2011-03-05 ENCOUNTER — Ambulatory Visit: Payer: Medicare Other | Admitting: *Deleted

## 2011-05-17 HISTORY — PX: PACEMAKER INSERTION: SHX728

## 2011-06-21 ENCOUNTER — Ambulatory Visit (HOSPITAL_COMMUNITY)
Admission: RE | Admit: 2011-06-21 | Discharge: 2011-06-21 | Disposition: A | Discharge status: Home / Self Care | Payer: Medicare Other | Source: Ambulatory Visit | Attending: Internal Medicine | Admitting: Internal Medicine

## 2011-06-21 ENCOUNTER — Other Ambulatory Visit: Payer: Self-pay | Admitting: Internal Medicine

## 2011-06-21 DIAGNOSIS — M25569 Pain in unspecified knee: Secondary | ICD-10-CM | POA: Insufficient documentation

## 2011-06-21 DIAGNOSIS — M1990 Unspecified osteoarthritis, unspecified site: Secondary | ICD-10-CM

## 2011-10-01 ENCOUNTER — Ambulatory Visit: Payer: Medicare Other | Admitting: Dietician

## 2011-10-22 IMAGING — CR DG CHEST 2V
2 series · 2 of 2 positions shown · non-contrast
Comparison: 01/25/2011

CLINICAL DATA: CHF and shortness of breath on admission.

CHEST - 2 VIEW

[w chest pa]
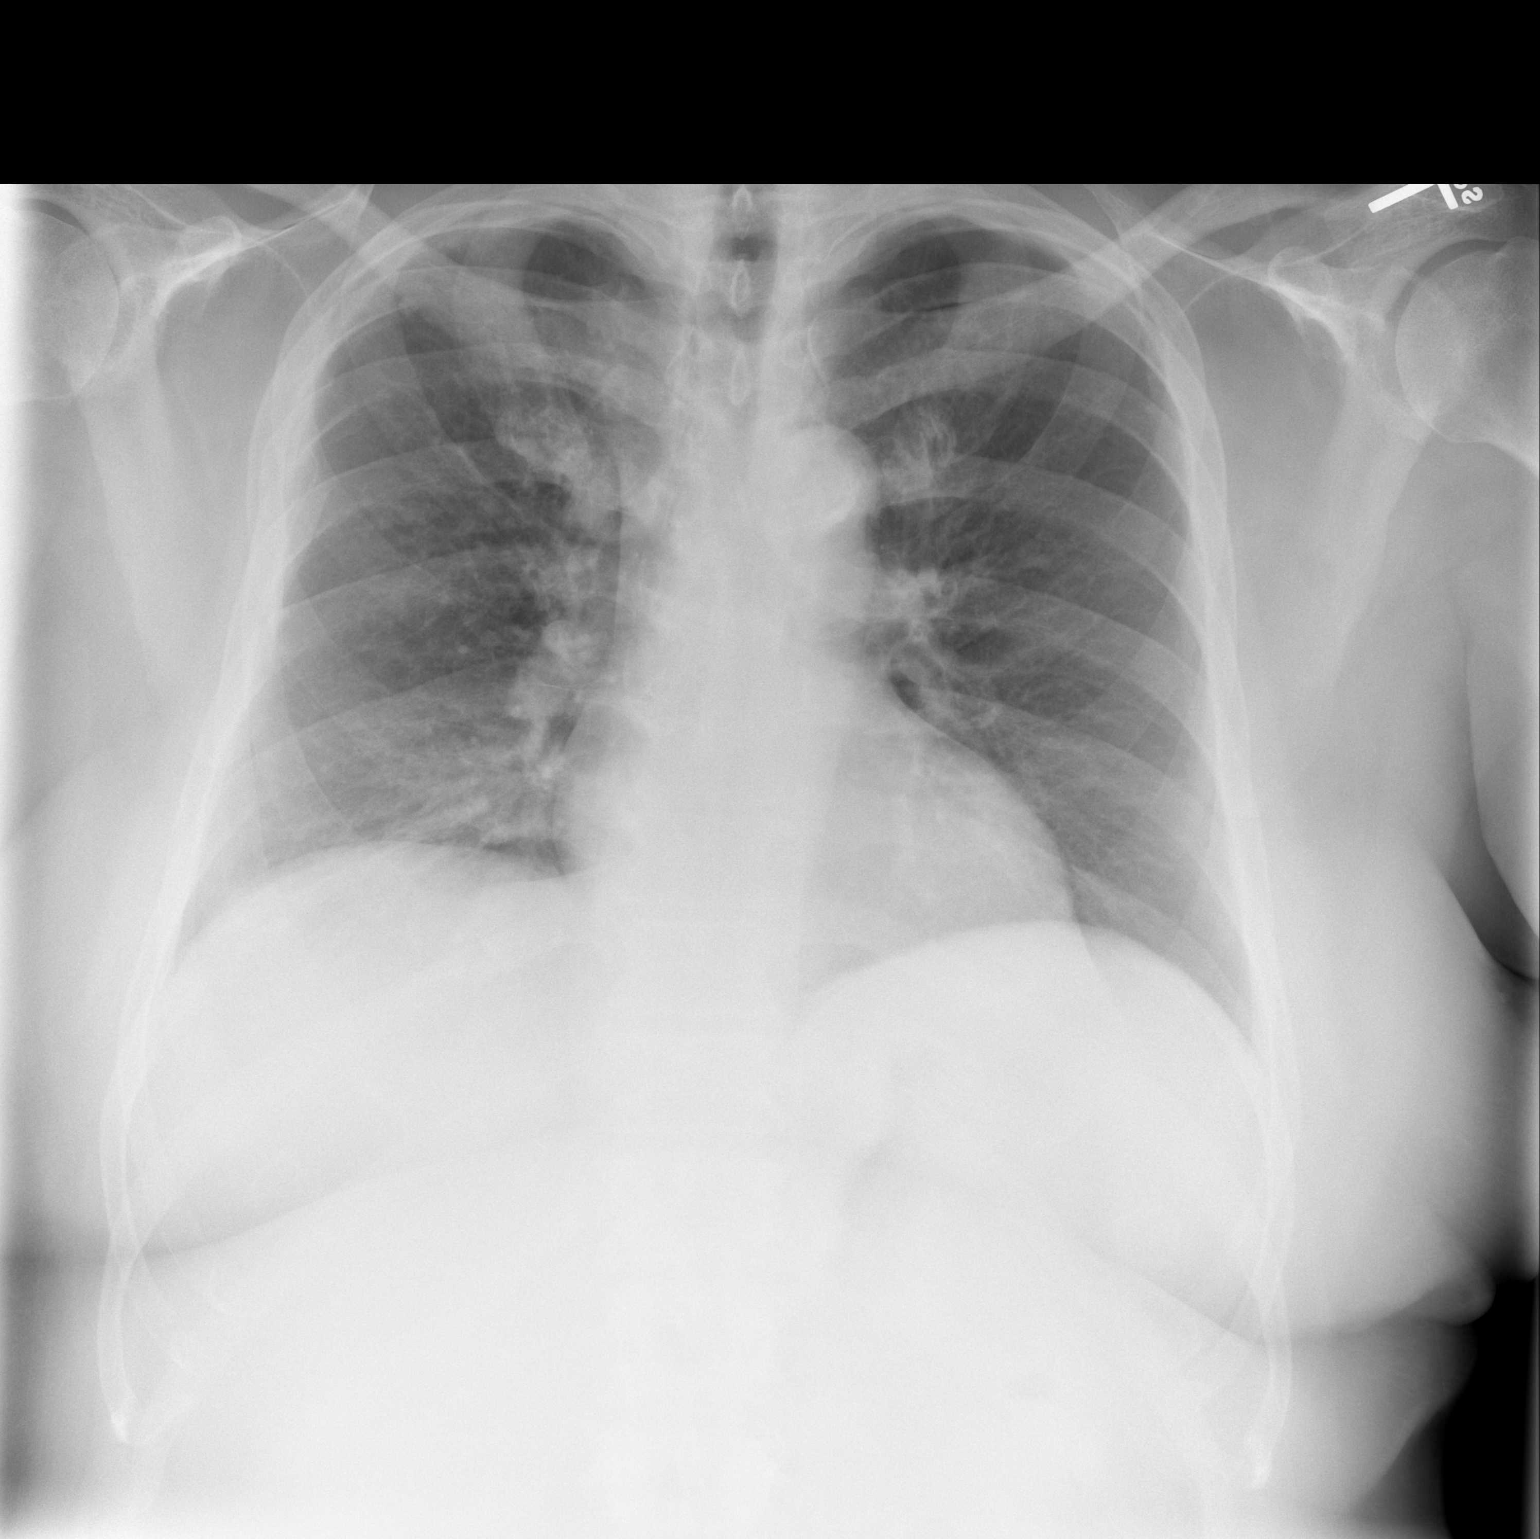

[w chest lat]
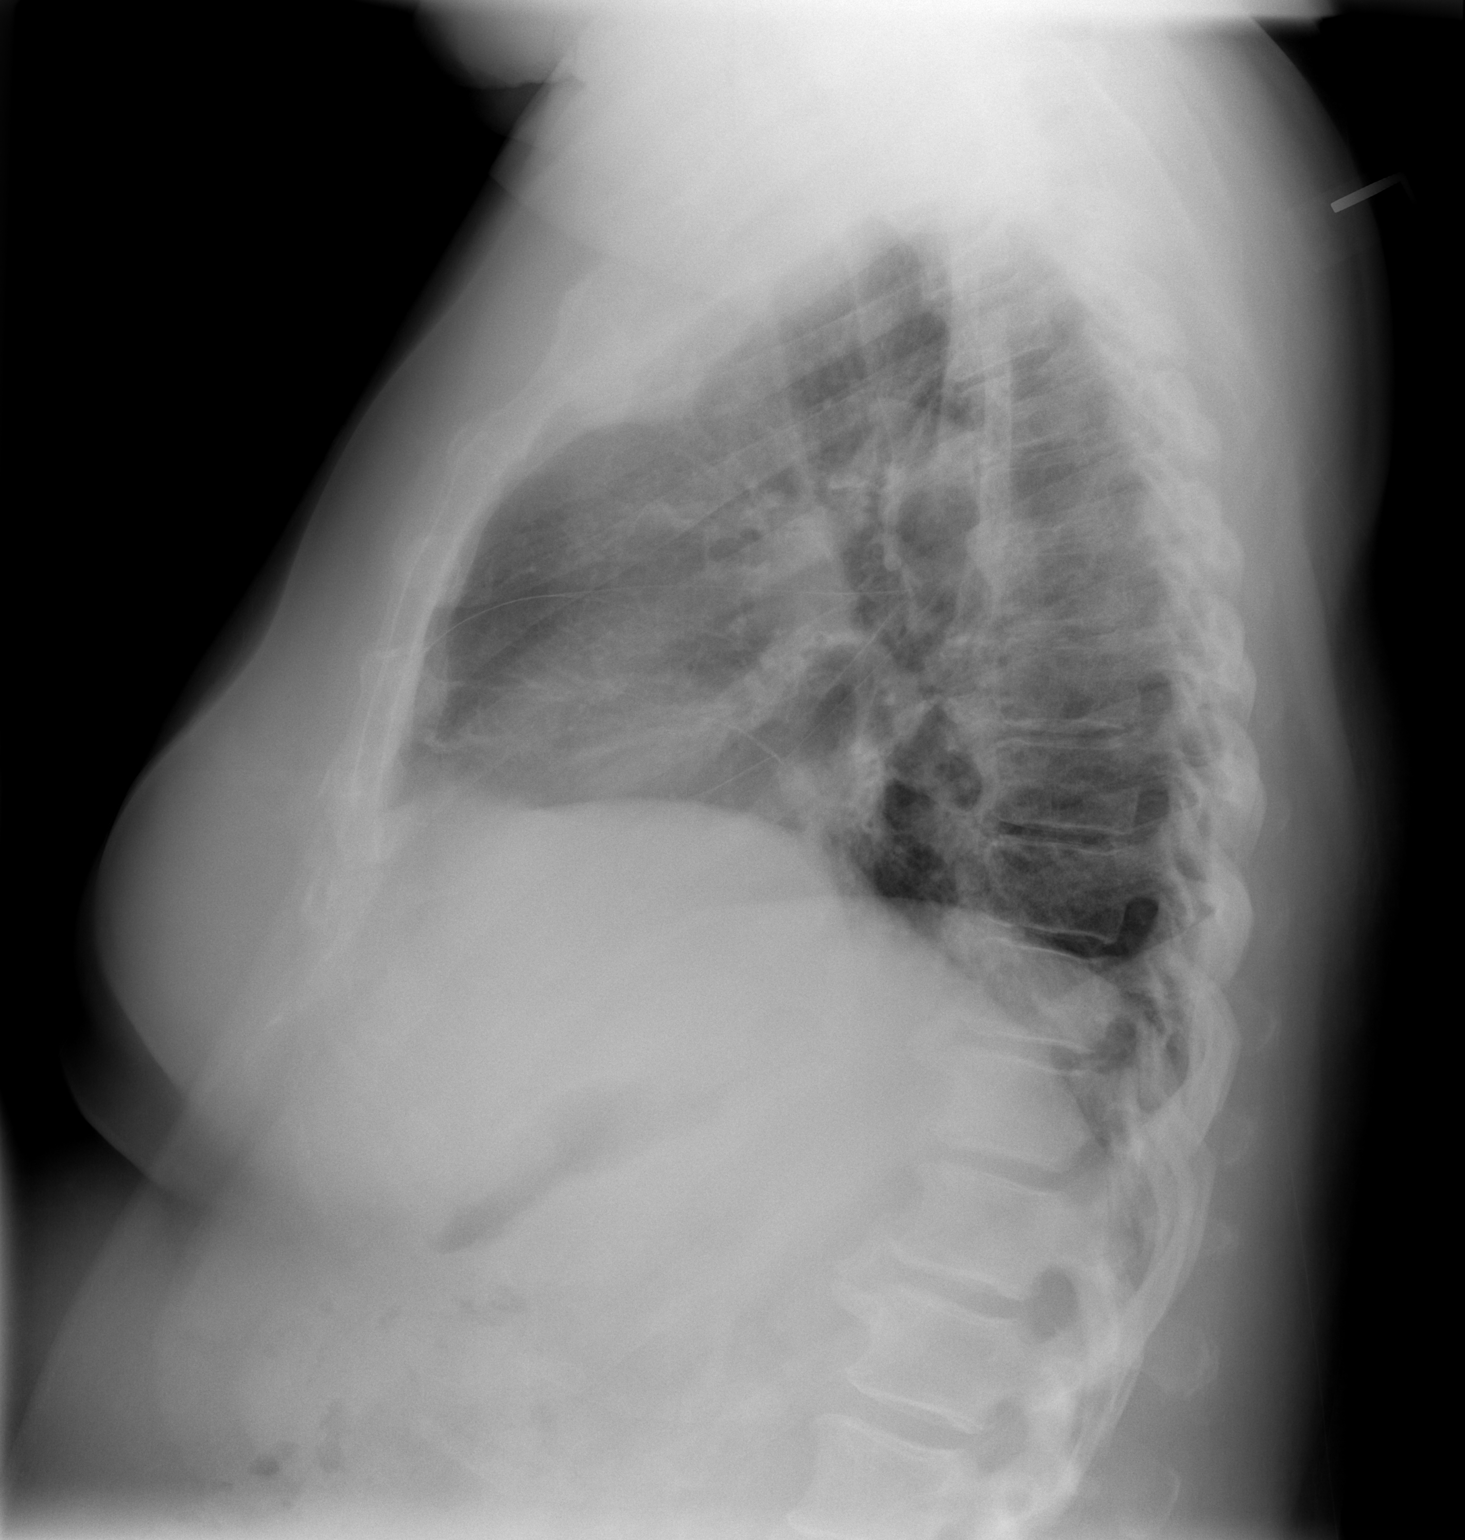

[2 of 2 positions shown; findings below may reference images not displayed]

FINDINGS: Two views of the chest were obtained.  Stable calcified
nodule in the right upper lung consistent with a granuloma.  There
are persistent parenchymal densities at the right lung base.  There
are old lateral right rib fractures.  No evidence for pulmonary
edema.  Heart size is normal.  Trachea is midline.  No significant
pleural effusions.
IMPRESSION: Minimal change since the prior examination.  Subtle densities at
the medial right lung base could represent a small focus of
infection versus atelectasis.

## 2011-12-15 DIAGNOSIS — I513 Intracardiac thrombosis, not elsewhere classified: Secondary | ICD-10-CM

## 2011-12-15 HISTORY — DX: Intracardiac thrombosis, not elsewhere classified: I51.3

## 2011-12-18 ENCOUNTER — Emergency Department (HOSPITAL_COMMUNITY): Payer: Medicare Other

## 2011-12-18 ENCOUNTER — Encounter (HOSPITAL_COMMUNITY): Payer: Self-pay

## 2011-12-18 ENCOUNTER — Inpatient Hospital Stay (HOSPITAL_COMMUNITY)
Admission: EM | Admit: 2011-12-18 | Discharge: 2011-12-27 | DRG: 291 | Disposition: A | Discharge status: Other Acute Inpatient Hospital | Payer: Medicare Other | Attending: Internal Medicine | Admitting: Internal Medicine

## 2011-12-18 DIAGNOSIS — E876 Hypokalemia: Secondary | ICD-10-CM | POA: Diagnosis not present

## 2011-12-18 DIAGNOSIS — I130 Hypertensive heart and chronic kidney disease with heart failure and stage 1 through stage 4 chronic kidney disease, or unspecified chronic kidney disease: Principal | ICD-10-CM | POA: Diagnosis present

## 2011-12-18 DIAGNOSIS — M109 Gout, unspecified: Secondary | ICD-10-CM | POA: Diagnosis present

## 2011-12-18 DIAGNOSIS — I4891 Unspecified atrial fibrillation: Secondary | ICD-10-CM

## 2011-12-18 DIAGNOSIS — I5189 Other ill-defined heart diseases: Secondary | ICD-10-CM | POA: Diagnosis present

## 2011-12-18 DIAGNOSIS — A4901 Methicillin susceptible Staphylococcus aureus infection, unspecified site: Secondary | ICD-10-CM | POA: Diagnosis present

## 2011-12-18 DIAGNOSIS — E669 Obesity, unspecified: Secondary | ICD-10-CM | POA: Diagnosis present

## 2011-12-18 DIAGNOSIS — Z9119 Patient's noncompliance with other medical treatment and regimen: Secondary | ICD-10-CM

## 2011-12-18 DIAGNOSIS — Z91199 Patient's noncompliance with other medical treatment and regimen due to unspecified reason: Secondary | ICD-10-CM

## 2011-12-18 DIAGNOSIS — Z6838 Body mass index (BMI) 38.0-38.9, adult: Secondary | ICD-10-CM

## 2011-12-18 DIAGNOSIS — C61 Malignant neoplasm of prostate: Secondary | ICD-10-CM | POA: Diagnosis present

## 2011-12-18 DIAGNOSIS — E119 Type 2 diabetes mellitus without complications: Secondary | ICD-10-CM | POA: Diagnosis present

## 2011-12-18 DIAGNOSIS — Z794 Long term (current) use of insulin: Secondary | ICD-10-CM

## 2011-12-18 DIAGNOSIS — Z7901 Long term (current) use of anticoagulants: Secondary | ICD-10-CM

## 2011-12-18 DIAGNOSIS — N183 Chronic kidney disease, stage 3 unspecified: Secondary | ICD-10-CM | POA: Diagnosis present

## 2011-12-18 DIAGNOSIS — I513 Intracardiac thrombosis, not elsewhere classified: Secondary | ICD-10-CM | POA: Diagnosis not present

## 2011-12-18 DIAGNOSIS — I5043 Acute on chronic combined systolic (congestive) and diastolic (congestive) heart failure: Secondary | ICD-10-CM | POA: Diagnosis present

## 2011-12-18 DIAGNOSIS — L02419 Cutaneous abscess of limb, unspecified: Secondary | ICD-10-CM | POA: Diagnosis present

## 2011-12-18 DIAGNOSIS — R5381 Other malaise: Secondary | ICD-10-CM | POA: Diagnosis present

## 2011-12-18 DIAGNOSIS — D72829 Elevated white blood cell count, unspecified: Secondary | ICD-10-CM

## 2011-12-18 DIAGNOSIS — K5900 Constipation, unspecified: Secondary | ICD-10-CM | POA: Diagnosis not present

## 2011-12-18 DIAGNOSIS — L97809 Non-pressure chronic ulcer of other part of unspecified lower leg with unspecified severity: Secondary | ICD-10-CM | POA: Diagnosis present

## 2011-12-18 DIAGNOSIS — R7881 Bacteremia: Secondary | ICD-10-CM

## 2011-12-18 DIAGNOSIS — K219 Gastro-esophageal reflux disease without esophagitis: Secondary | ICD-10-CM | POA: Diagnosis present

## 2011-12-18 DIAGNOSIS — I10 Essential (primary) hypertension: Secondary | ICD-10-CM | POA: Diagnosis present

## 2011-12-18 DIAGNOSIS — I509 Heart failure, unspecified: Secondary | ICD-10-CM | POA: Diagnosis present

## 2011-12-18 DIAGNOSIS — I519 Heart disease, unspecified: Secondary | ICD-10-CM

## 2011-12-18 DIAGNOSIS — I5032 Chronic diastolic (congestive) heart failure: Secondary | ICD-10-CM | POA: Diagnosis present

## 2011-12-18 DIAGNOSIS — Z79899 Other long term (current) drug therapy: Secondary | ICD-10-CM

## 2011-12-18 HISTORY — DX: Unspecified atrial fibrillation: I48.91

## 2011-12-18 HISTORY — DX: Heart disease, unspecified: I51.9

## 2011-12-18 HISTORY — DX: Chronic kidney disease, stage 3 (moderate): N18.3

## 2011-12-18 HISTORY — DX: Gastro-esophageal reflux disease without esophagitis: K21.9

## 2011-12-18 LAB — GLUCOSE, CAPILLARY
Glucose-Capillary: 350 mg/dL — ABNORMAL HIGH (ref 70–99)
Glucose-Capillary: 343 mg/dL — ABNORMAL HIGH (ref 70–99)

## 2011-12-18 LAB — CBC WITH DIFFERENTIAL/PLATELET
Basophils Absolute: 0 10*3/uL (ref 0.0–0.1)
Basophils Relative: 0 % (ref 0–1)
HCT: 36.2 % — ABNORMAL LOW (ref 39.0–52.0)
Lymphocytes Relative: 16 % (ref 12–46)
MCHC: 32.3 g/dL (ref 30.0–36.0)
Monocytes Absolute: 1.1 10*3/uL — ABNORMAL HIGH (ref 0.1–1.0)
Neutro Abs: 10.1 10*3/uL — ABNORMAL HIGH (ref 1.7–7.7)
Neutrophils Relative %: 75 % (ref 43–77)
Platelets: 205 10*3/uL (ref 150–400)
RDW: 14.7 % (ref 11.5–15.5)
WBC: 13.5 10*3/uL — ABNORMAL HIGH (ref 4.0–10.5)

## 2011-12-18 LAB — COMPREHENSIVE METABOLIC PANEL
ALT: 16 U/L (ref 0–53)
AST: 19 U/L (ref 0–37)
Albumin: 3.3 g/dL — ABNORMAL LOW (ref 3.5–5.2)
CO2: 28 mEq/L (ref 19–32)
Chloride: 95 mEq/L — ABNORMAL LOW (ref 96–112)
Creatinine, Ser: 1.73 mg/dL — ABNORMAL HIGH (ref 0.50–1.35)
Sodium: 134 mEq/L — ABNORMAL LOW (ref 135–145)
Total Bilirubin: 1.4 mg/dL — ABNORMAL HIGH (ref 0.3–1.2)

## 2011-12-18 LAB — URINALYSIS, ROUTINE W REFLEX MICROSCOPIC
Bilirubin Urine: NEGATIVE
Glucose, UA: 500 mg/dL — AB
Protein, ur: 30 mg/dL — AB
Specific Gravity, Urine: 1.012 (ref 1.005–1.030)

## 2011-12-18 LAB — URINE MICROSCOPIC-ADD ON

## 2011-12-18 LAB — TROPONIN I: Troponin I: <0.3 ng/mL (ref <0.30)

## 2011-12-18 MED ORDER — FUROSEMIDE 10 MG/ML IJ SOLN
80.0000 mg | Freq: Two times a day (BID) | INTRAMUSCULAR | Status: DC
  Administered 2011-12-18 – 2011-12-20 (×4): 80 mg via INTRAVENOUS
  Filled 2011-12-18 (×5): qty 8

## 2011-12-18 MED ORDER — ALLOPURINOL 150 MG HALF TABLET
150.0000 mg | ORAL_TABLET | Freq: Every day | ORAL | Status: DC
  Administered 2011-12-18 – 2011-12-27 (×10): 150 mg via ORAL
  Filled 2011-12-18 (×11): qty 1

## 2011-12-18 MED ORDER — CARVEDILOL 6.25 MG PO TABS
6.2500 mg | ORAL_TABLET | Freq: Two times a day (BID) | ORAL | Status: DC
  Administered 2011-12-18 – 2011-12-19 (×2): 6.25 mg via ORAL
  Filled 2011-12-18 (×3): qty 1

## 2011-12-18 MED ORDER — ONDANSETRON HCL 4 MG/2ML IJ SOLN: 4.0000 mg | Freq: Four times a day (QID) | INTRAMUSCULAR | Status: DC | PRN

## 2011-12-18 MED ORDER — INSULIN ASPART PROT & ASPART (70-30 MIX) 100 UNIT/ML ~~LOC~~ SUSP
30.0000 [IU] | Freq: Two times a day (BID) | SUBCUTANEOUS | Status: DC
  Administered 2011-12-18 – 2011-12-19 (×2): 30 [IU] via SUBCUTANEOUS
  Filled 2011-12-18: qty 3

## 2011-12-18 MED ORDER — NITROGLYCERIN 2 % TD OINT
1.0000 [in_us] | TOPICAL_OINTMENT | Freq: Once | TRANSDERMAL | Status: AC
  Administered 2011-12-18: 1 [in_us] via TOPICAL
  Filled 2011-12-18: qty 1

## 2011-12-18 MED ORDER — KETOCONAZOLE 200 MG PO TABS
200.0000 mg | ORAL_TABLET | Freq: Every day | ORAL | Status: DC
  Administered 2011-12-18 – 2011-12-20 (×3): 200 mg via ORAL
  Filled 2011-12-18 (×3): qty 1

## 2011-12-18 MED ORDER — SODIUM CHLORIDE 0.9 % IV SOLN: INTRAVENOUS | Status: DC

## 2011-12-18 MED ORDER — INSULIN ASPART 100 UNIT/ML ~~LOC~~ SOLN
0.0000 [IU] | Freq: Every day | SUBCUTANEOUS | Status: DC
  Administered 2011-12-18: 4 [IU] via SUBCUTANEOUS
  Administered 2011-12-19 – 2011-12-25 (×3): 2 [IU] via SUBCUTANEOUS
  Administered 2011-12-26: 3 [IU] via SUBCUTANEOUS

## 2011-12-18 MED ORDER — ACETAMINOPHEN 325 MG PO TABS
650.0000 mg | ORAL_TABLET | ORAL | Status: DC | PRN
  Administered 2011-12-19 – 2011-12-27 (×14): 650 mg via ORAL
  Filled 2011-12-18 (×15): qty 2

## 2011-12-18 MED ORDER — WARFARIN - PHARMACIST DOSING INPATIENT: Freq: Every day | Status: DC

## 2011-12-18 MED ORDER — WARFARIN SODIUM 7.5 MG PO TABS
7.5000 mg | ORAL_TABLET | Freq: Once | ORAL | Status: AC
  Administered 2011-12-18: 7.5 mg via ORAL
  Filled 2011-12-18: qty 1

## 2011-12-18 MED ORDER — HYDROCORTISONE 10 MG PO TABS
10.0000 mg | ORAL_TABLET | Freq: Every day | ORAL | Status: DC
  Filled 2011-12-18: qty 1

## 2011-12-18 MED ORDER — WARFARIN SODIUM 2.5 MG PO TABS
2.5000 mg | ORAL_TABLET | ORAL | Status: DC
  Filled 2011-12-18: qty 1

## 2011-12-18 MED ORDER — WARFARIN SODIUM 5 MG PO TABS
5.0000 mg | ORAL_TABLET | ORAL | Status: DC
  Filled 2011-12-18: qty 1

## 2011-12-18 MED ORDER — HYDROCORTISONE 10 MG PO TABS
10.0000 mg | ORAL_TABLET | Freq: Every day | ORAL | Status: DC
  Administered 2011-12-18 – 2011-12-20 (×3): 10 mg via ORAL
  Filled 2011-12-18 (×3): qty 1

## 2011-12-18 MED ORDER — SODIUM CHLORIDE 0.9 % IJ SOLN: 3.0000 mL | INTRAMUSCULAR | Status: DC | PRN

## 2011-12-18 MED ORDER — INSULIN ASPART 100 UNIT/ML ~~LOC~~ SOLN
0.0000 [IU] | Freq: Three times a day (TID) | SUBCUTANEOUS | Status: DC
  Administered 2011-12-19 (×2): 11 [IU] via SUBCUTANEOUS
  Administered 2011-12-19: 7 [IU] via SUBCUTANEOUS
  Administered 2011-12-20: 3 [IU] via SUBCUTANEOUS
  Administered 2011-12-20: 7 [IU] via SUBCUTANEOUS
  Administered 2011-12-21: 15 [IU] via SUBCUTANEOUS
  Administered 2011-12-21 – 2011-12-22 (×2): 4 [IU] via SUBCUTANEOUS
  Administered 2011-12-22: 7 [IU] via SUBCUTANEOUS
  Administered 2011-12-23 (×2): 4 [IU] via SUBCUTANEOUS
  Administered 2011-12-24: 3 [IU] via SUBCUTANEOUS
  Administered 2011-12-25: 20 [IU] via SUBCUTANEOUS
  Administered 2011-12-25 – 2011-12-26 (×5): 4 [IU] via SUBCUTANEOUS
  Administered 2011-12-27: 7 [IU] via SUBCUTANEOUS
  Administered 2011-12-27 (×2): 4 [IU] via SUBCUTANEOUS

## 2011-12-18 MED ORDER — ENOXAPARIN SODIUM 40 MG/0.4ML ~~LOC~~ SOLN
40.0000 mg | SUBCUTANEOUS | Status: DC
  Administered 2011-12-18 – 2011-12-21 (×4): 40 mg via SUBCUTANEOUS
  Filled 2011-12-18 (×5): qty 0.4

## 2011-12-18 MED ORDER — SODIUM CHLORIDE 0.9 % IJ SOLN
3.0000 mL | Freq: Two times a day (BID) | INTRAMUSCULAR | Status: DC
  Administered 2011-12-18 – 2011-12-27 (×17): 3 mL via INTRAVENOUS

## 2011-12-18 MED ORDER — FUROSEMIDE 10 MG/ML IJ SOLN
80.0000 mg | Freq: Once | INTRAMUSCULAR | Status: AC
  Administered 2011-12-18: 80 mg via INTRAVENOUS
  Filled 2011-12-18: qty 8

## 2011-12-18 MED ORDER — DILTIAZEM HCL 25 MG/5ML IV SOLN
20.0000 mg | Freq: Once | INTRAVENOUS | Status: AC
  Administered 2011-12-18: 20 mg via INTRAVENOUS
  Filled 2011-12-18: qty 5

## 2011-12-18 MED ORDER — SODIUM CHLORIDE 0.9 % IV SOLN: 250.0000 mL | INTRAVENOUS | Status: DC | PRN

## 2011-12-18 MED ORDER — DILTIAZEM HCL 100 MG IV SOLR
10.0000 mg/h | INTRAVENOUS | Status: DC
  Administered 2011-12-18 – 2011-12-19 (×4): 10 mg/h via INTRAVENOUS
  Filled 2011-12-18 (×5): qty 100

## 2011-12-18 NOTE — Progress Notes (Signed)
Disposition Note  Vincent [name], is a 76 y.o. male,   MRN: [id number]  -  DOB - [birth year]  Outpatient Primary MD for the patient is No primary provider on file. Past Medical History  Diagnosis Date  . Diabetes mellitus   . Hypertension   . CHF (congestive heart failure)   . Cancer   . Pneumonia   . Irregular heartbeat   . Prostate cancer      Blood pressure 152/73, pulse 94, temperature 99.2 F (37.3 C), temperature source Oral, resp. rate 22, SpO2 100.00%.  Active Problems:  Acute exacerbation of CHF (congestive heart failure)  Atrial fibrillation with rapid ventricular response  CHF (congestive heart failure)  Hypertension  Irregular heartbeat  Prostate cancer    76 yo man with history above presents with progressive SOB and 3+ pitting edema bilaterally for 1 week.  He also complains of 3 weeks of left leg pain.  He reportedly has an open wound on his left leg.    His BNP is elevated at 4560, wbc is 13.5.   Initially in the ED he was in Afib with RVR but with cardizem he is back is sinus rhythm per Dr. Ignacia Palma.  As he is back in sinus rhythm and his RVR controlled, I have requested a telemetry bed for CHF exacerbation and chronic lower extremity wound care.   Algis Downs, PA-C [facility] Pager: [phone]

## 2011-12-18 NOTE — ED Notes (Signed)
Pt with 3 weeks of leg pain, open area on calf of left leg, back pain in left lower back, increased SOB over last few days

## 2011-12-18 NOTE — ED Provider Notes (Cosign Needed)
History     CSN: [id number]  Arrival date & time 12/18/11  7829   None     Chief Complaint  Patient presents with  . Shortness of Breath    (Consider location/radiation/quality/duration/timing/severity/associated sxs/prior treatment) HPI Comments: Patient is a 76 year old man who epigastric pain, onset a week ago. He has had pain in his back. His legs are weak and is hard for him to stand up. He has had shortness of breath, and has swelling in his left and right leg. Also, he has had sores on his legs for which she goes to the wound care clinic care every 2 weeks. Has a history of prior congestive failure and prior atrial fibrillation. He is diabetic on insulin.  Patient is a 76 y.o. male presenting with shortness of breath. The history is provided by the patient and medical records. No language interpreter was used.  Shortness of Breath  The current episode started 5 to 7 days ago. The problem occurs continuously. The problem has been gradually worsening. The problem is moderate. Nothing relieves the symptoms. Associated symptoms include orthopnea and shortness of breath. He has had prior hospitalizations. He has had no prior ICU admissions. He has had no prior intubations. Recently, medical care has been given by the PCP.    Past Medical History  Diagnosis Date  . Diabetes mellitus   . Hypertension   . CHF (congestive heart failure)   . Cancer   . Pneumonia   . Irregular heartbeat   . Prostate cancer     History reviewed. No pertinent past surgical history.  History reviewed. No pertinent family history.  History  Substance Use Topics  . Smoking status: Never Smoker   . Smokeless tobacco: Not on file  . Alcohol Use: No      Review of Systems  HENT: Negative.   Eyes: Negative.   Respiratory: Positive for shortness of breath.   Cardiovascular: Positive for orthopnea and leg swelling.  Gastrointestinal: Negative.   Genitourinary: Negative.   Musculoskeletal:  Negative.   Neurological: Positive for weakness.  Psychiatric/Behavioral: Negative.     Allergies  Review of patient's allergies indicates no known allergies.  Home Medications   Current Outpatient Rx  Name Route Sig Dispense Refill  . ALLOPURINOL 300 MG PO TABS Oral Take 300 mg by mouth daily.      Marland Kitchen CARVEDILOL 12.5 MG PO TABS Oral Take 25 mg by mouth every morning.     . FUROSEMIDE 80 MG PO TABS Oral Take 80 mg by mouth 1 day or 1 dose.     Marland Kitchen HYDROCORTISONE 10 MG PO TABS Oral Take 10 mg by mouth daily.     . INSULIN ASPART 100 UNIT/ML [place] SOLN Subcutaneous Inject into the skin as needed.     . INSULIN ISOPHANE & REGULAR (70-30) 100 UNIT/ML [place] SUSP Subcutaneous Inject 55 Units into the skin daily with breakfast.     . KETOCONAZOLE 200 MG PO TABS Oral Take 200 mg by mouth daily.     . WARFARIN SODIUM 2.5 MG PO TABS Oral Take 2.5 mg by mouth 2 (two) times a week. Mondays and Fridays    . WARFARIN SODIUM 5 MG PO TABS Oral Take 5 mg by mouth See admin instructions. Tuesday, Wednesday, Thursday,  Saturday and Sunday      BP 144/104  Pulse 150  Temp 98.9 F (37.2 C) (Oral)  Resp 20  SpO2 99%  Physical Exam  Nursing note and vitals reviewed. Constitutional: He  is oriented to person, place, and time. He appears well-developed and well-nourished.       In mild distress with shortness of breath.  Eyes: Conjunctivae and EOM are normal. Pupils are equal, round, and reactive to light.  Neck: Normal range of motion. Neck supple.  Cardiovascular:       Rate of approximately 150, atrial fibrillation on the monitor.  Pulmonary/Chest: Effort normal.       He has fine rales at both bases.  Abdominal: Soft. Bowel sounds are normal.  Musculoskeletal: Normal range of motion. He exhibits edema.  Lymphadenopathy:    He has no cervical adenopathy.  Neurological: He is alert and oriented to person, place, and time.       No sensory or motor deficit.  Skin: Skin is warm and dry.       He used to  ulcerated areas on his left lower leg, one on the upper shin, one on the lateral aspect of his left lower leg just above the ankle. The one on the chin is about a centimeter diameter the one on the left lower leg is a 1 x 2 cm in area.  Psychiatric: He has a normal mood and affect. His behavior is normal.    ED Course  Procedures (including critical care time)  9:05 AM  Date: 12/18/2011  Rate: 150  Rhythm: atrial fibrillation  QRS Axis: left  Intervals: normal  ST/T Wave abnormalities: normal  Conduction Disutrbances:none  Narrative Interpretation: Abnormal EKG  Old EKG Reviewed: changes noted--Was in sinus rhythm on 01/23/2011.  10:36 AM  Date: 12/18/2011  Rate:85  Rhythm: normal sinus rhythm  QRS Axis: left  Intervals: PR prolonged and QT prolonged  ST/T Wave abnormalities: normal  Conduction Disutrbances:first-degree A-V block   Narrative Interpretation: Abnormal EKG  Old EKG Reviewed: changes noted--has reverted to sinus rhythm from atrial fibrillation.  12:45 PM Results for orders placed during the hospital encounter of 12/18/11  CBC WITH DIFFERENTIAL      Component Value Range   WBC 13.5 (*) 4.0 - 10.5 K/uL   RBC 3.71 (*) 4.22 - 5.81 MIL/uL   Hemoglobin 11.7 (*) 13.0 - 17.0 g/dL   HCT 40.9 (*) 81.1 - 91.4 %   MCV 97.6  78.0 - 100.0 fL   MCH 31.5  26.0 - 34.0 pg   MCHC 32.3  30.0 - 36.0 g/dL   RDW 78.2  95.6 - 21.3 %   Platelets 205  150 - 400 K/uL   Neutrophils Relative 75  43 - 77 %   Neutro Abs 10.1 (*) 1.7 - 7.7 K/uL   Lymphocytes Relative 16  12 - 46 %   Lymphs Abs 2.1  0.7 - 4.0 K/uL   Monocytes Relative 8  3 - 12 %   Monocytes Absolute 1.1 (*) 0.1 - 1.0 K/uL   Eosinophils Relative 1  0 - 5 %   Eosinophils Absolute 0.1  0.0 - 0.7 K/uL   Basophils Relative 0  0 - 1 %   Basophils Absolute 0.0  0.0 - 0.1 K/uL  COMPREHENSIVE METABOLIC PANEL      Component Value Range   Sodium 134 (*) 135 - 145 mEq/L   Potassium 3.5  3.5 - 5.1 mEq/L   Chloride 95 (*) 96 -  112 mEq/L   CO2 28  19 - 32 mEq/L   Glucose, Bld 354 (*) 70 - 99 mg/dL   BUN 18  6 - 23 mg/dL   Creatinine, Ser 0.86 (*)  0.50 - 1.35 mg/dL   Calcium 9.4  8.4 - 40.9 mg/dL   Total Protein 8.4 (*) 6.0 - 8.3 g/dL   Albumin 3.3 (*) 3.5 - 5.2 g/dL   AST 19  0 - 37 U/L   ALT 16  0 - 53 U/L   Alkaline Phosphatase 82  39 - 117 U/L   Total Bilirubin 1.4 (*) 0.3 - 1.2 mg/dL   GFR calc non Af Amer 37 (*) >90 mL/min   GFR calc Af Amer 43 (*) >90 mL/min  URINALYSIS, ROUTINE W REFLEX MICROSCOPIC      Component Value Range   Color, Urine YELLOW  YELLOW   APPearance CLEAR  CLEAR   Specific Gravity, Urine 1.012  1.005 - 1.030   pH 6.5  5.0 - 8.0   Glucose, UA 500 (*) NEGATIVE mg/dL   Hgb urine dipstick SMALL (*) NEGATIVE   Bilirubin Urine NEGATIVE  NEGATIVE   Ketones, ur NEGATIVE  NEGATIVE mg/dL   Protein, ur 30 (*) NEGATIVE mg/dL   Urobilinogen, UA 1.0  0.0 - 1.0 mg/dL   Nitrite NEGATIVE  NEGATIVE   Leukocytes, UA NEGATIVE  NEGATIVE  PROTIME-INR      Component Value Range   Prothrombin Time 17.1 (*) 11.6 - 15.2 seconds   INR 1.37  0.00 - 1.49  APTT      Component Value Range   aPTT 31  24 - 37 seconds  PRO B NATRIURETIC PEPTIDE      Component Value Range   Pro B Natriuretic peptide (BNP) 4560.0 (*) 0 - 125 pg/mL  URINE MICROSCOPIC-ADD ON      Component Value Range   Squamous Epithelial / LPF RARE  RARE   WBC, UA 0-2  <3 WBC/hpf   RBC / HPF 3-6  <3 RBC/hpf   Bacteria, UA RARE  RARE   Dg Chest Port 1 View  12/18/2011  *RADIOLOGY REPORT*  Clinical Data: Atrial fibrillation.  High blood pressure. Diabetes. Shortness of breath.  PORTABLE CHEST - 1 VIEW  Comparison: 03/02/2011.  Findings: Remote right rib fractures.  Granuloma right upper lobe unchanged since 2008.  Cardiomegaly.  Pulmonary vascular prominence most notable centrally.  No segmental consolidation or gross pneumothorax.  The patient would eventually benefit from follow-up two-view chest with cardiac leads removed.  IMPRESSION:  Cardiomegaly.  Pulmonary vascular prominence most notable centrally.  Calcified slightly tortuous aorta.  Stable granuloma right upper lobe.   Original Report Authenticated By: Fuller Canada, M.D.     Lab workup shows CHF.   Pt was given IV cardizem, with resolution of his rapid atrial fibrillation.    1:27 PM Case discussed with [place], PA-C.  Admit for CHF.     1. Congestive heart failure   2. Atrial fibrillation   3. Acute exacerbation of CHF (congestive heart failure)   4. Atrial fibrillation with rapid ventricular response   5. DM2 (diabetes mellitus, type 2)   6. Hypertension   7. Leukocytosis   8. Bacteremia            Carleene Cooper III, MD 12/20/11 [phone]

## 2011-12-18 NOTE — H&P (Signed)
[facility] History and Physical  Vincent [name] YNW:[id number] DOB: November 09, 1935 DOA: 12/18/2011   PCP: [name], MD   Chief Complaint: Dyspnea and leg edema causing heaviness  HPI:  This is a 76 year old male who presents with a one-week history of increasing shortness of breath and worsening bilateral lower extremity edema. The patient states that he has noted increasing lower extremity edema to the point where he is unable to lift his legs because they feel some heavy. Of note, the patient states that he is supposed to take Lasix 80 mg twice a day, but he states that he only takes it once daily. In addition, he seems to have very poor insight on his medications. In fact, the patient states that the only reason he can to the hospital was because his wife urged him to do so because he was having difficulty lifting his legs. He denies any other focal extremity weakness, syncope, dizziness, visual changes, dysarthria.  The patient states that the past 24 hours he has had to sleep sitting up in a chair because of his dyspnea. He denies any chest pain, hemoptysis, fevers, chills, nausea, vomiting, diarrhea. The patient was given 80 mg of intravenous Lasix in the emergency department from which the patient states that he is 50% better with regard to his breathing. In addition the patient was found to have a fibrillation with a rapid ventricular response of 150 in the emergency department. He was given Cardizem 20 mg IV x1 followed by a Cardizem drip at 10 mg hour. He remained hemodynamically stable at this time. Echocardiogram on every 12 2009 showed ejection fraction of 50%. Assessment/Plan: Acute on chronic CHF exacerbation-appears to be diastolic at this point -We'll continue aggressive diuresis -Hold on ACE inhibitor at this time due to his renal insufficiency -Continue carvedilol at this time at a lower dose as I suspect that he will continue to diurese and dropped his blood  pressure Atrial fibrillation with rapid ventricular response -Continue Cardizem drip at this time. -I will plan to convert the patient to oral Cardizem tomorrow if he remains stable -Check echocardiogram, TSH -I will ask pharmacy to help this the warfarin as it likely has interactions with the ketoconazole -INR is subtherapeutic at this time We'll check serum troponins as well as lipids in the morning Diabetes mellitus type 2, insulin-dependent -Check hemoglobin A1c and lipids -placed the patient at a lower dose of 70/30 at this time and follow his blood sugars Leukocytosis -Blood cultures x2 sets    Past Medical History  Diagnosis Date  . Diabetes mellitus   . Hypertension   . CHF (congestive heart failure)   . Cancer   . Pneumonia   . Irregular heartbeat   . Prostate cancer    History reviewed. No pertinent past surgical history. Social History:  reports that he has never smoked. He does not have any smokeless tobacco history on file. He reports that he does not drink alcohol or use illicit drugs.  No Known Allergies  History reviewed. No pertinent family history.  Prior to Admission medications   Medication Sig Start Date End Date Taking? Authorizing Provider  allopurinol (ZYLOPRIM) 300 MG tablet Take 300 mg by mouth daily.     Yes Historical Provider, MD  carvedilol (COREG) 12.5 MG tablet Take 25 mg by mouth every morning.    Yes Historical Provider, MD  furosemide (LASIX) 80 MG tablet Take 80 mg by mouth 1 day or 1 dose.    Yes Historical  Provider, MD  hydrocortisone (CORTEF) 10 MG tablet Take 10 mg by mouth daily.    Yes Historical Provider, MD  insulin aspart (NOVOLOG) 100 UNIT/ML injection Inject into the skin as needed.    Yes Historical Provider, MD  insulin NPH-insulin regular (NOVOLIN 70/30) (70-30) 100 UNIT/ML injection Inject 55 Units into the skin daily with breakfast.    Yes Historical Provider, MD  ketoconazole (NIZORAL) 200 MG tablet Take 200 mg by mouth  daily.    Yes Historical Provider, MD  warfarin (COUMADIN) 2.5 MG tablet Take 2.5 mg by mouth 2 (two) times a week. Mondays and Fridays   Yes Historical Provider, MD  warfarin (COUMADIN) 5 MG tablet Take 5 mg by mouth See admin instructions. Tuesday, Wednesday, Thursday,  Saturday and Sunday   Yes Historical Provider, MD    Review of Systems:  Constitutional:  No weight loss, night sweats, Fevers, chills, fatigue.  Head&Eyes: No headache.  No vision loss.  No eye pain or scotoma ENT:  No Difficulty swallowing,Tooth/dental problems,Sore throat,  No ear ache, post nasal drip,  Cardio-vascular:  No chest pain, Orthopnea, PND, swelling in lower extremities,  dizziness, palpitations  GI:  No heartburn, indigestion, abdominal pain, nausea, vomiting, diarrhea,loss of appetite, hematochezia, melena Resp:  No shortness of breath with exertion or at rest. No excess mucus, no productive cough, No non-productive cough, No coughing up of blood.No change in color of mucus.No wheezing.No chest wall deformity  Skin:  no rash or lesions.  GU:  no dysuria, change in color of urine, no urgency or frequency. No flank pain.  Musculoskeletal:  No joint pain or swelling. No decreased range of motion. No back pain.  Psych:  No change in mood or affect. No depression or anxiety. Neurologic: No headache, no dysesthesia, no focal weakness, no vision loss. No syncope  Physical Exam: Filed Vitals:   12/18/11 1103 12/18/11 1213 12/18/11 1358 12/18/11 1457  BP: 142/72 152/73 140/87 133/74  Pulse: 96 94 96 90  Temp: 99.2 F (37.3 C)   98.9 F (37.2 C)  TempSrc: Oral   Oral  Resp: 18 22 16 18   Height:    5\' 9"  (1.753 m)  Weight:    119.4 kg (263 lb 3.7 oz)  SpO2: 100% 100% 99% 100%   General:  A&O x 3, NAD, nontoxic, pleasant/cooperative Head/Eye: No conjunctival hemorrhage, no icterus, [place]/AT, No nystagmus ENT:  No icterus,  No thrush, poor dentition, no pharyngeal exudate Neck:  No masses, no  lymphadenpathy, no bruits CV:  IRRR, no rub, no gallop, no S3 Lung:  Bibasilar crackles left greater than right, good air movement, no wheeze, no rhonchi Abdomen: soft/NT, +BS, nondistended, no peritoneal signs Ext: No cyanosis, No rashes, No petechiae, No lymphangitis, 2+ edema-scattered scabs on the left calf without any drainage Neuro: CNII-XII intact, strength 4/5 in bilateral upper and lower extremities, no dysmetria  Labs on Admission:  Basic Metabolic Panel:  Lab 12/18/11 9147  NA 134*  K 3.5  CL 95*  CO2 28  GLUCOSE 354*  BUN 18  CREATININE 1.73*  CALCIUM 9.4  MG --  PHOS --   Liver Function Tests:  Lab 12/18/11 0955  AST 19  ALT 16  ALKPHOS 82  BILITOT 1.4*  PROT 8.4*  ALBUMIN 3.3*   No results found for this basename: LIPASE:5,AMYLASE:5 in the last 168 hours No results found for this basename: AMMONIA:5 in the last 168 hours CBC:  Lab 12/18/11 0955  WBC 13.5*  NEUTROABS 10.1*  HGB 11.7*  HCT 36.2*  MCV 97.6  PLT 205   Cardiac Enzymes: No results found for this basename: CKTOTAL:5,CKMB:5,CKMBINDEX:5,TROPONINI:5 in the last 168 hours BNP: No components found with this basename: POCBNP:5 CBG: No results found for this basename: GLUCAP:5 in the last 168 hours  Radiological Exams on Admission: Dg Chest Port 1 View  12/18/2011  *RADIOLOGY REPORT*  Clinical Data: Atrial fibrillation.  High blood pressure. Diabetes. Shortness of breath.  PORTABLE CHEST - 1 VIEW  Comparison: 03/02/2011.  Findings: Remote right rib fractures.  Granuloma right upper lobe unchanged since 2008.  Cardiomegaly.  Pulmonary vascular prominence most notable centrally.  No segmental consolidation or gross pneumothorax.  The patient would eventually benefit from follow-up two-view chest with cardiac leads removed.  IMPRESSION: Cardiomegaly.  Pulmonary vascular prominence most notable centrally.  Calcified slightly tortuous aorta.  Stable granuloma right upper lobe.   Original Report  Authenticated By: Fuller Canada, M.D.     EKG: Independently reviewed. Atrial fibrillation, heart rate 150, no acute ST-T changes    Time spend:70 minutes Code Status: full    Preslyn Warr, DO  [facility] Pager [phone]  If 7PM-7AM, please contact night-coverage [email or web address] Password [email or web address] 12/18/2011, 4:12 PM

## 2011-12-18 NOTE — Progress Notes (Signed)
Patient transferred from ED to room 4730 via stretcher to bed. Patient is alert and oriented x4. Patient oriented to surroundings in the room including demonstrating call bell system. Explained and educated patient to the kind of floor he is on, Heart failure flr, and why and how it will benefit his health and why he is here. Patient understood. Patient is able to use the urinal and was also able to get up with 2 assist to the bedside. Currently patient complains of no signs or symptoms of pain. VSS, Will continue to monitor to end of shift.

## 2011-12-18 NOTE — Progress Notes (Signed)
ANTICOAGULATION CONSULT NOTE - Initial Consult  Pharmacy Consult for Warfarin Indication: atrial fibrillation  No Known Allergies  Patient Measurements: Height: 5\' 9"  (175.3 cm) Weight: 263 lb 3.7 oz (119.4 kg) (bed scale;pt unable to stand) IBW/kg (Calculated) : 70.7   Vital Signs: Temp: 98.9 F (37.2 C) (09/04 1457) Temp src: Oral (09/04 1457) BP: 133/74 mmHg (09/04 1457) Pulse Rate: 90  (09/04 1457)  Labs:  Basename 12/18/11 0955  HGB 11.7*  HCT 36.2*  PLT 205  APTT 31  LABPROT 17.1*  INR 1.37  HEPARINUNFRC --  CREATININE 1.73*  CKTOTAL --  CKMB --  TROPONINI --    Estimated Creatinine Clearance: 47.8 ml/min (by C-G formula based on Cr of 1.73).   Medical History: Past Medical History  Diagnosis Date  . Diabetes mellitus   . Hypertension   . CHF (congestive heart failure)   . Cancer   . Pneumonia   . Irregular heartbeat   . Prostate cancer     Medications:  Prescriptions prior to admission  Medication Sig Dispense Refill  . allopurinol (ZYLOPRIM) 300 MG tablet Take 300 mg by mouth daily.        . carvedilol (COREG) 12.5 MG tablet Take 25 mg by mouth every morning.       . furosemide (LASIX) 80 MG tablet Take 80 mg by mouth 1 day or 1 dose.       . hydrocortisone (CORTEF) 10 MG tablet Take 10 mg by mouth daily.       . insulin aspart (NOVOLOG) 100 UNIT/ML injection Inject into the skin as needed.       . insulin NPH-insulin regular (NOVOLIN 70/30) (70-30) 100 UNIT/ML injection Inject 55 Units into the skin daily with breakfast.       . ketoconazole (NIZORAL) 200 MG tablet Take 200 mg by mouth daily.       Marland Kitchen warfarin (COUMADIN) 2.5 MG tablet Take 2.5 mg by mouth 2 (two) times a week. Mondays and Fridays      . warfarin (COUMADIN) 5 MG tablet Take 5 mg by mouth See admin instructions. Tuesday, Wednesday, Thursday,  Saturday and Sunday        Assessment: 76 y.o. M admitted to [facility] on 9/4 with an acute on chronic CHF exacerbation. The patient was on  chronic warfarin PTA for hx Afib and pharmacy has been consulted to resume dosing during this admission. The patient's home dose was verified as 5 mg daily EXCEPT for 2.5 mg on Mon/Fri. The patient's last dose was on 9/3 a.m. INR on admission is SUBtherapeutic (INR 1.37, goal of 2-3). The patient is also on low-dose lovenox for VTE px while INR SUBtherapeutic.   Given the patient's SUBtherapeutic INR -- will give a booster dose of 7.5 mg today and then plan to dose cautiously in setting of CHF exacerbation and start of corticosteroids -- both which are known to increase warfarin sensitivity. The patient was on ketoconazole PTA -- so the increased effect from this has likely already been accounted for.   Goal of Therapy:  INR 2-3   Plan:  1. Warfarin 7.5 mg x 1 dose at 1800 today 2. Daily PT/INR 3. Will plan to discontinue lovenox when INR >/= 2.  4. Will continue to monitor for any signs/symptoms of bleeding and will follow up with PT/INR in the a.m.   Georgina Pillion, PharmD, BCPS Clinical Pharmacist Pager: [phone] 12/18/2011 5:33 PM

## 2011-12-19 LAB — GLUCOSE, CAPILLARY
Glucose-Capillary: 280 mg/dL — ABNORMAL HIGH (ref 70–99)
Glucose-Capillary: 230 mg/dL — ABNORMAL HIGH (ref 70–99)

## 2011-12-19 LAB — CBC WITH DIFFERENTIAL/PLATELET
Basophils Relative: 0 % (ref 0–1)
Eosinophils Absolute: 0.1 10*3/uL (ref 0.0–0.7)
HCT: 29.8 % — ABNORMAL LOW (ref 39.0–52.0)
Hemoglobin: 9.6 g/dL — ABNORMAL LOW (ref 13.0–17.0)
MCH: 31.5 pg (ref 26.0–34.0)
MCHC: 32.2 g/dL (ref 30.0–36.0)
Monocytes Absolute: 1 10*3/uL (ref 0.1–1.0)
Monocytes Relative: 8 % (ref 3–12)
Neutrophils Relative %: 76 % (ref 43–77)
RBC: 3.05 MIL/uL — ABNORMAL LOW (ref 4.22–5.81)
WBC: 12.3 10*3/uL — ABNORMAL HIGH (ref 4.0–10.5)

## 2011-12-19 LAB — URINE CULTURE: Colony Count: 3000

## 2011-12-19 LAB — BASIC METABOLIC PANEL
BUN: 20 mg/dL (ref 6–23)
Creatinine, Ser: 1.78 mg/dL — ABNORMAL HIGH (ref 0.50–1.35)
GFR calc Af Amer: 42 mL/min — ABNORMAL LOW (ref >90)
GFR calc non Af Amer: 36 mL/min — ABNORMAL LOW (ref >90)

## 2011-12-19 LAB — HEMOGLOBIN A1C: Mean Plasma Glucose: 206 mg/dL — ABNORMAL HIGH (ref <117)

## 2011-12-19 LAB — PROTIME-INR
INR: 1.4 (ref 0.00–1.49)
Prothrombin Time: 17.4 seconds — ABNORMAL HIGH (ref 11.6–15.2)

## 2011-12-19 MED ORDER — CARVEDILOL 12.5 MG PO TABS
12.5000 mg | ORAL_TABLET | Freq: Two times a day (BID) | ORAL | Status: DC
  Administered 2011-12-19 – 2011-12-25 (×12): 12.5 mg via ORAL
  Filled 2011-12-19 (×14): qty 1

## 2011-12-19 MED ORDER — WARFARIN SODIUM 7.5 MG PO TABS
7.5000 mg | ORAL_TABLET | Freq: Once | ORAL | Status: AC
  Administered 2011-12-19: 7.5 mg via ORAL
  Filled 2011-12-19: qty 1

## 2011-12-19 MED ORDER — VANCOMYCIN HCL 1000 MG IV SOLR
2500.0000 mg | Freq: Once | INTRAVENOUS | Status: AC
  Administered 2011-12-19: 2500 mg via INTRAVENOUS
  Filled 2011-12-19: qty 2500

## 2011-12-19 MED ORDER — BACITRACIN-NEOMYCIN-POLYMYXIN OINTMENT TUBE
TOPICAL_OINTMENT | Freq: Every day | CUTANEOUS | Status: DC
  Administered 2011-12-19 – 2011-12-27 (×9): via TOPICAL
  Filled 2011-12-19 (×2): qty 15

## 2011-12-19 MED ORDER — CARVEDILOL 6.25 MG PO TABS
6.2500 mg | ORAL_TABLET | Freq: Once | ORAL | Status: AC
  Administered 2011-12-19: 6.25 mg via ORAL
  Filled 2011-12-19: qty 1

## 2011-12-19 MED ORDER — CARVEDILOL 6.25 MG PO TABS
6.2500 mg | ORAL_TABLET | Freq: Once | ORAL | Status: DC
  Filled 2011-12-19: qty 1

## 2011-12-19 MED ORDER — POTASSIUM CHLORIDE CRYS ER 20 MEQ PO TBCR
40.0000 meq | EXTENDED_RELEASE_TABLET | Freq: Once | ORAL | Status: AC
  Administered 2011-12-19: 40 meq via ORAL
  Filled 2011-12-19: qty 2

## 2011-12-19 MED ORDER — INSULIN ASPART PROT & ASPART (70-30 MIX) 100 UNIT/ML ~~LOC~~ SUSP
40.0000 [IU] | Freq: Two times a day (BID) | SUBCUTANEOUS | Status: DC
  Administered 2011-12-19 – 2011-12-20 (×2): 40 [IU] via SUBCUTANEOUS

## 2011-12-19 MED ORDER — MENTHOL 3 MG MT LOZG
1.0000 | LOZENGE | OROMUCOSAL | Status: DC | PRN
  Filled 2011-12-19 (×2): qty 9

## 2011-12-19 MED ORDER — VANCOMYCIN HCL 1000 MG IV SOLR
1500.0000 mg | INTRAVENOUS | Status: DC
  Administered 2011-12-20 – 2011-12-21 (×2): 1500 mg via INTRAVENOUS
  Filled 2011-12-19 (×3): qty 1500

## 2011-12-19 NOTE — Progress Notes (Signed)
CRITICAL VALUE ALERT  Critical value received:  Gram cocci (+) positive in clusters  Date of notification:  12/19/2011  Time of notification:  1419  Critical value read back:yes  Nurse who received alert: Milta Deiters  MD notified (1st page):  Dr. Onalee Hua Tat  Time of first page:  1420  MD notified (2nd page): n/a  Time of second page:  Responding MD:  Dr. Onalee Hua Tat  Time MD responded:  [phone]

## 2011-12-19 NOTE — Progress Notes (Signed)
Lab results communicated to nurse during shift change, paged MD in reference to second set of blood cultures that came back gram positive cocci with clusters. Attending doctor is aware of the first set drawn which had the same result and started patient on Vancomycin earlier this afternoon. Will notify oncoming nurse.

## 2011-12-19 NOTE — Progress Notes (Signed)
ANTICOAGULATION CONSULT NOTE - Follow-Up Consult  Pharmacy Consult for Warfarin Indication: atrial fibrillation  No Known Allergies  Patient Measurements: Height: 5\' 9"  (175.3 cm) Weight: 260 lb 3.2 oz (118.026 kg) (bed) IBW/kg (Calculated) : 70.7   Vital Signs: Temp: 99.5 F (37.5 C) (09/05 0422) Temp src: Axillary (09/05 0422) BP: 142/64 mmHg (09/05 0955) Pulse Rate: 90  (09/05 0955)  Labs:  Basename 12/19/11 0655 12/18/11 2211 12/18/11 1655 12/18/11 0955  HGB 9.6* -- -- 11.7*  HCT 29.8* -- -- 36.2*  PLT 190 -- -- 205  APTT -- -- -- 31  LABPROT 17.4* -- -- 17.1*  INR 1.40 -- -- 1.37  HEPARINUNFRC -- -- -- --  CREATININE 1.78* -- -- 1.73*  CKTOTAL -- -- -- --  CKMB -- -- -- --  TROPONINI -- <0.30 <0.30 --    Estimated Creatinine Clearance: 46.1 ml/min (by C-G formula based on Cr of 1.78).  Assessment: 76 y.o. M admitted to [facility] on 9/4 with an acute on chronic CHF exacerbation. Pt on coumadin PTA for afib. (5 mg daily EXCEPT for 2.5 mg on Mon/Fri). INR remains SUBtherapeutic. The patient is also on low-dose lovenox for VTE px while INR SUBtherapeutic.  The patient was on ketoconazole PTA -- so the increased effect from this has likely already been accounted for.   Goal of Therapy:  INR 2-3   Plan:  1. Warfarin 7.5 mg x 1 dose at 1800 today 2. Daily PT/INR 3. Will plan to discontinue lovenox when INR >/= 2.  4. Will continue to monitor for any signs/symptoms of bleeding and will follow up with PT/INR in the a.m.   Christoper Fabian, PharmD, BCPS Clinical pharmacist, pager [phone] 12/19/2011 10:48 AM

## 2011-12-19 NOTE — Progress Notes (Signed)
Echo was done today, EF result  is 45-55%.

## 2011-12-19 NOTE — Consult Note (Signed)
WOC consult Note Reason for Consult: Consult requested for left leg wounds.  Both legs with generalized edema but right leg without wounds or drainage. Pt states that previous blisters had large amt yellow drainage prior to admission. Wound type: Left leg with 2 areas of partial thickness wounds where previous blisters have ruptured. Measurement:  Left upper leg 1X1.5X.1cm Left outer ankle 1X2X.1cm Wound bed: Both sites with dry red woundbeds Drainage (amount, consistency, odor) No odor or drainage Periwound: Old peeling blister surrounding. Dressing procedure/placement/frequency: Antibiotic ointment to promote moist healing.  Foam dressing to protect. Will not plan to follow further unless re-consulted.  [address], RN, MSN, [employer]  [phone]

## 2011-12-19 NOTE — Progress Notes (Signed)
  Echocardiogram 2D Echocardiogram has been performed.  Cathie Beams 12/19/2011, 3:53 PM

## 2011-12-19 NOTE — Progress Notes (Signed)
ANTIBIOTIC CONSULT NOTE - INITIAL  Pharmacy Consult for Vancomycin Indication: bacteremia  No Known Allergies  Patient Measurements: Height: 5\' 9"  (175.3 cm) Weight: 260 lb 3.2 oz (118.026 kg) (bed) IBW/kg (Calculated) : 70.7   Vital Signs: Temp: 98.4 F (36.9 C) (09/05 1322) Temp src: Oral (09/05 1322) BP: 112/67 mmHg (09/05 1322) Pulse Rate: 86  (09/05 1322) Intake/Output from previous day: 09/04 0701 - 09/05 0700 In: 922.7 [P.O.:720; I.V.:202.7] Out: 1100 [Urine:1100] Intake/Output from this shift: Total I/O In: 240 [P.O.:240] Out: 1300 [Urine:1300]  Labs:  Vincent [name] 12/19/11 0655 12/18/11 0955  WBC 12.3* 13.5*  HGB 9.6* 11.7*  PLT 190 205  LABCREA -- --  CREATININE 1.78* 1.73*   Estimated Creatinine Clearance: 46.1 ml/min (by C-G formula based on Cr of 1.78). No results found for this basename: VANCOTROUGH:2,VANCOPEAK:2,VANCORANDOM:2,GENTTROUGH:2,GENTPEAK:2,GENTRANDOM:2,TOBRATROUGH:2,TOBRAPEAK:2,TOBRARND:2,AMIKACINPEAK:2,AMIKACINTROU:2,AMIKACIN:2, in the last 72 hours   Microbiology: Recent Results (from the past 720 hour(s))  URINE CULTURE     Status: Normal   Collection Time   12/18/11 10:38 AM      Component Value Range Status Comment   Specimen Description URINE, CLEAN CATCH   Final    Special Requests NONE   Final    Culture  Setup Time 12/18/2011 11:50   Final    Colony Count 3,000 COLONIES/ML   Final    Culture INSIGNIFICANT GROWTH   Final    Report Status 12/19/2011 FINAL   Final   CULTURE, BLOOD (ROUTINE X 2)     Status: Normal (Preliminary result)   Collection Time   12/18/11  4:35 PM      Component Value Range Status Comment   Specimen Description BLOOD LEFT HAND   Final    Special Requests BOTTLES DRAWN AEROBIC ONLY 7CC   Final    Culture  Setup Time 12/18/2011 22:56   Final    Culture     Final    Value:        BLOOD CULTURE RECEIVED NO GROWTH TO DATE CULTURE WILL BE HELD FOR 5 DAYS BEFORE ISSUING A FINAL NEGATIVE REPORT   Report Status PENDING    Incomplete   CULTURE, BLOOD (ROUTINE X 2)     Status: Normal (Preliminary result)   Collection Time   12/18/11  4:45 PM      Component Value Range Status Comment   Specimen Description BLOOD LEFT HAND   Final    Special Requests BOTTLES DRAWN AEROBIC ONLY 5CC   Final    Culture  Setup Time 12/18/2011 22:57   Final    Culture     Final    Value: GRAM POSITIVE COCCI IN CLUSTERS     Note: Gram Stain Report Called to,Read Back By and Verified With: COURTNEY GENTRY 12/19/11 1410 BY SMITJ   Report Status PENDING   Incomplete     Medical History: Past Medical History  Diagnosis Date  . Diabetes mellitus   . Hypertension   . CHF (congestive heart failure)   . Cancer   . Pneumonia   . Irregular heartbeat   . Prostate cancer   . Shortness of breath   . GERD (gastroesophageal reflux disease)     Assessment: 27 YOM known to pharmacy for coumadin dosing, now to add vancomycin for 1/2 blood culture positive for GPC in clusters. He is afebrile, wbc is mildly elevated. Scr = 1/78, which is close to his baseline. Est. crcl ~ 40 ml/min  Goal of Therapy:  Vancomycin trough level 15-20 mcg/ml  Plan:  - Vancomycin  loading dose 2500 mg iv x 1 then 1500 mg IV Q 24hrs - f/u cultures and renal function.   Bayard Hugger, PharmD, BCPS  Clinical Pharmacist  Pager: [phone]  12/19/2011,2:58 PM

## 2011-12-19 NOTE — Progress Notes (Signed)
[facility] PROGRESS NOTE  Vincent [name] UJW:[id number] DOB: Jul 12, 1935 DOA: 12/18/2011 PCP: [name], MD  Assessment/Plan:  Acute on chronic CHF exacerbation-appears to be diastolic at this point  -continue aggressive diuresis  -Hold on ACE inhibitor at this time due to his renal insufficiency  -Increase carvedilol to 12.5 mg twice a day -His weight has decreased by 1 kg Paroxysmal Atrial fibrillation with rapid ventricular response  -Discontinue Cardizem drip with increase in carvedilol dose -Echocardiogram is pending, TSH 2.090  -INR is subtherapeutic at this time  Diabetes mellitus type 2, insulin-dependent  -Hemoglobin A1c 8.8, LDL 70 -Increase 70/30 insulin to 40 units twice a day Leukocytosis  -Blood cultures negative, WBC trending down Medical noncompliance -After conversation with the patient's daughter, it appears that the patient has difficulty with following up his outpatient appointments as well as taking his medications properly Deconditioning -Order PT evaluation Hypokalemia -Will replete    Procedures/Studies: Dg Chest Port 1 View  12/18/2011  *RADIOLOGY REPORT*  Clinical Data: Atrial fibrillation.  High blood pressure. Diabetes. Shortness of breath.  PORTABLE CHEST - 1 VIEW  Comparison: 03/02/2011.  Findings: Remote right rib fractures.  Granuloma right upper lobe unchanged since 2008.  Cardiomegaly.  Pulmonary vascular prominence most notable centrally.  No segmental consolidation or gross pneumothorax.  The patient would eventually benefit from follow-up two-view chest with cardiac leads removed.  IMPRESSION: Cardiomegaly.  Pulmonary vascular prominence most notable centrally.  Calcified slightly tortuous aorta.  Stable granuloma right upper lobe.   Original Report Authenticated By: Fuller Canada, M.D.         Code Status: Full Family Communication: Discussed with daughter ([name])--[phone] Disposition Plan: Home when medically  stable  Subjective: Patient states that his breathing is 50% better. He denies any fevers, chills, chest pain, shortness of breath, nausea, vomiting, diarrhea, abdominal pain.  Objective: Filed Vitals:   12/18/11 1632 12/18/11 2025 12/19/11 0422 12/19/11 0955  BP:  143/74 134/76 142/64  Pulse:  70 91 90  Temp:  97.8 F (36.6 C) 99.5 F (37.5 C)   TempSrc:  Oral Axillary   Resp:  18 18   Height:      Weight: 119.4 kg (263 lb 3.7 oz)  118.026 kg (260 lb 3.2 oz)   SpO2:  95% 100%     Intake/Output Summary (Last 24 hours) at 12/19/11 1221 Last data filed at 12/19/11 0600  Gross per 24 hour  Intake 922.67 ml  Output    900 ml  Net  22.67 ml   Weight change:  Exam:   General:  Pt is alert, follows commands appropriately, not in acute distress  HEENT: No icterus, No thrush,  [place]/AT  Cardiovascular: Regular rate and rhythm, S1/S2, no rubs, no gallops  Respiratory: Bibasilar crackles, no wheezing, no rhonchi  Abdomen: Soft, non tender, non distended, bowel sounds present, no guarding  Extremities: 2+ pitting edema, No lymphangitis, No petechiae, No rashes, no synovitis  Data Reviewed: Basic Metabolic Panel:  Lab 12/19/11 5621 12/18/11 0955  NA 133* 134*  K 3.3* 3.5  CL 94* 95*  CO2 27 28  GLUCOSE 300* 354*  BUN 20 18  CREATININE 1.78* 1.73*  CALCIUM 8.9 9.4  MG -- --  PHOS -- --   Liver Function Tests:  Lab 12/18/11 0955  AST 19  ALT 16  ALKPHOS 82  BILITOT 1.4*  PROT 8.4*  ALBUMIN 3.3*   No results found for this basename: LIPASE:5,AMYLASE:5 in the last 168 hours No results found  for this basename: AMMONIA:5 in the last 168 hours CBC:  Lab 12/19/11 0655 12/18/11 0955  WBC 12.3* 13.5*  NEUTROABS 9.3* 10.1*  HGB 9.6* 11.7*  HCT 29.8* 36.2*  MCV 97.7 97.6  PLT 190 205   Cardiac Enzymes:  Lab 12/18/11 2211 12/18/11 1655  CKTOTAL -- --  CKMB -- --  CKMBINDEX -- --  TROPONINI <0.30 <0.30   BNP: No components found with this basename:  POCBNP:5 CBG:  Lab 12/19/11 0624 12/18/11 2145 12/18/11 1621  GLUCAP 280* 343* 350*    Recent Results (from the past 240 hour(s))  CULTURE, BLOOD (ROUTINE X 2)     Status: Normal (Preliminary result)   Collection Time   12/18/11  4:35 PM      Component Value Range Status Comment   Specimen Description BLOOD LEFT HAND   Final    Special Requests BOTTLES DRAWN AEROBIC ONLY 7CC   Final    Culture  Setup Time 12/18/2011 22:56   Final    Culture     Final    Value:        BLOOD CULTURE RECEIVED NO GROWTH TO DATE CULTURE WILL BE HELD FOR 5 DAYS BEFORE ISSUING A FINAL NEGATIVE REPORT   Report Status PENDING   Incomplete   CULTURE, BLOOD (ROUTINE X 2)     Status: Normal (Preliminary result)   Collection Time   12/18/11  4:45 PM      Component Value Range Status Comment   Specimen Description BLOOD LEFT HAND   Final    Special Requests BOTTLES DRAWN AEROBIC ONLY 5CC   Final    Culture  Setup Time 12/18/2011 22:57   Final    Culture     Final    Value:        BLOOD CULTURE RECEIVED NO GROWTH TO DATE CULTURE WILL BE HELD FOR 5 DAYS BEFORE ISSUING A FINAL NEGATIVE REPORT   Report Status PENDING   Incomplete      Scheduled Meds:   . allopurinol  150 mg Oral Daily  . carvedilol  12.5 mg Oral BID WC  . carvedilol  6.25 mg Oral Once  . enoxaparin  40 mg Subcutaneous Q24H  . furosemide  80 mg Intravenous Q12H  . hydrocortisone  10 mg Oral Daily  . insulin aspart  0-20 Units Subcutaneous TID WC  . insulin aspart  0-5 Units Subcutaneous QHS  . insulin aspart protamine-insulin aspart  40 Units Subcutaneous BID PC  . ketoconazole  200 mg Oral Daily  . neomycin-bacitracin-polymyxin   Topical Daily  . nitroGLYCERIN  1 inch Topical Once  . potassium chloride  40 mEq Oral Once  . sodium chloride  3 mL Intravenous Q12H  . warfarin  7.5 mg Oral ONCE-1800  . warfarin  7.5 mg Oral ONCE-1800  . Warfarin - Pharmacist Dosing Inpatient   Does not apply q1800  . DISCONTD: carvedilol  6.25 mg Oral Q12H  .  DISCONTD: carvedilol  6.25 mg Oral Once  . DISCONTD: hydrocortisone  10 mg Oral Daily  . DISCONTD: insulin aspart protamine-insulin aspart  30 Units Subcutaneous BID PC  . DISCONTD: warfarin  2.5 mg Oral 2 times weekly  . DISCONTD: warfarin  5 mg Oral Custom  . DISCONTD: Warfarin - Pharmacist Dosing Inpatient   Does not apply q1800   Continuous Infusions:   . diltiazem (CARDIZEM) infusion 10 mg/hr (12/19/11 0326)  . DISCONTD: sodium chloride Stopped (12/18/11 1855)     Keena Heesch, DO  [facility] Pager [phone]  If 7PM-7AM, please contact night-coverage [email or web address] Password [email or web address] 12/19/2011, 12:21 PM   LOS: 1 day

## 2011-12-20 DIAGNOSIS — R7881 Bacteremia: Secondary | ICD-10-CM

## 2011-12-20 LAB — GLUCOSE, CAPILLARY
Glucose-Capillary: 95 mg/dL (ref 70–99)
Glucose-Capillary: 242 mg/dL — ABNORMAL HIGH (ref 70–99)
Glucose-Capillary: 139 mg/dL — ABNORMAL HIGH (ref 70–99)

## 2011-12-20 LAB — BASIC METABOLIC PANEL
CO2: 29 mEq/L (ref 19–32)
Calcium: 9 mg/dL (ref 8.4–10.5)
Creatinine, Ser: 1.92 mg/dL — ABNORMAL HIGH (ref 0.50–1.35)
Glucose, Bld: 144 mg/dL — ABNORMAL HIGH (ref 70–99)

## 2011-12-20 LAB — CBC
HCT: 34.2 % — ABNORMAL LOW (ref 39.0–52.0)
Hemoglobin: 11.2 g/dL — ABNORMAL LOW (ref 13.0–17.0)
MCH: 31.5 pg (ref 26.0–34.0)
MCHC: 32.7 g/dL (ref 30.0–36.0)
RBC: 3.55 MIL/uL — ABNORMAL LOW (ref 4.22–5.81)

## 2011-12-20 LAB — PROTIME-INR: INR: 1.57 — ABNORMAL HIGH (ref 0.00–1.49)

## 2011-12-20 MED ORDER — HYDROCORTISONE 20 MG PO TABS
20.0000 mg | ORAL_TABLET | Freq: Two times a day (BID) | ORAL | Status: DC
  Administered 2011-12-20 – 2011-12-27 (×14): 20 mg via ORAL
  Filled 2011-12-20 (×16): qty 1

## 2011-12-20 MED ORDER — KETOCONAZOLE 200 MG PO TABS
400.0000 mg | ORAL_TABLET | Freq: Three times a day (TID) | ORAL | Status: DC
  Administered 2011-12-20 – 2011-12-24 (×14): 400 mg via ORAL
  Filled 2011-12-20 (×17): qty 2

## 2011-12-20 MED ORDER — WARFARIN SODIUM 6 MG PO TABS
9.0000 mg | ORAL_TABLET | Freq: Once | ORAL | Status: DC
  Filled 2011-12-20: qty 1

## 2011-12-20 MED ORDER — INSULIN ASPART PROT & ASPART (70-30 MIX) 100 UNIT/ML ~~LOC~~ SUSP
50.0000 [IU] | Freq: Every day | SUBCUTANEOUS | Status: DC
  Administered 2011-12-21: 50 [IU] via SUBCUTANEOUS
  Administered 2011-12-22: 25 [IU] via SUBCUTANEOUS

## 2011-12-20 MED ORDER — WARFARIN SODIUM 7.5 MG PO TABS
7.5000 mg | ORAL_TABLET | Freq: Once | ORAL | Status: AC
  Administered 2011-12-20: 7.5 mg via ORAL
  Filled 2011-12-20: qty 1

## 2011-12-20 MED ORDER — POTASSIUM CHLORIDE CRYS ER 20 MEQ PO TBCR
40.0000 meq | EXTENDED_RELEASE_TABLET | Freq: Once | ORAL | Status: AC
  Administered 2011-12-20: 40 meq via ORAL
  Filled 2011-12-20: qty 2

## 2011-12-20 MED ORDER — FUROSEMIDE 40 MG PO TABS
40.0000 mg | ORAL_TABLET | Freq: Two times a day (BID) | ORAL | Status: DC
  Administered 2011-12-21 – 2011-12-23 (×5): 40 mg via ORAL
  Filled 2011-12-20 (×7): qty 1

## 2011-12-20 MED ORDER — FUROSEMIDE 40 MG PO TABS
40.0000 mg | ORAL_TABLET | Freq: Once | ORAL | Status: AC
  Administered 2011-12-20: 40 mg via ORAL
  Filled 2011-12-20: qty 1

## 2011-12-20 MED ORDER — INSULIN ASPART PROT & ASPART (70-30 MIX) 100 UNIT/ML ~~LOC~~ SUSP
40.0000 [IU] | Freq: Every day | SUBCUTANEOUS | Status: DC
  Administered 2011-12-20 – 2011-12-21 (×2): 40 [IU] via SUBCUTANEOUS
  Filled 2011-12-20: qty 3

## 2011-12-20 NOTE — Progress Notes (Signed)
Inpatient Diabetes Program Recommendations  AACE/ADA: New Consensus Statement on Inpatient Glycemic Control (2013)  Target Ranges:  Prepandial:   less than 140 mg/dL      Peak postprandial:   less than 180 mg/dL (1-2 hours)      Critically ill patients:  140 - 180 mg/dL   M5H=8.4 Per Medication reconciliation, the patient is on once daily dosing of 70/30 insulin.  May benefit from twice daily dosing at home as well.  Patient is currently on 70/30 40 units BID.  Continue to monitor during this hospitalization and make changes as appropriate.  Fasting much improved this morning CBG=95.    Thank you  Piedad Climes RN,BSN,CDE Inpatient Diabetes Coordinator [phone] (team pager)

## 2011-12-20 NOTE — Evaluation (Signed)
Physical Therapy Evaluation Patient Details Name: Vincent [name] MRN: [id number] DOB: February 19, 1936 Today's Date: 12/20/2011 Time: 4098-1191 PT Time Calculation (min): 25 min  PT Assessment / Plan / Recommendation Clinical Impression  Pt admitted with CHF, increased LE weakness and SOB. Pt will benefit from skilled PT in the acute care setting in order to maximize functional mobility and strength.    PT Assessment  Patient needs continued PT services    Follow Up Recommendations  Skilled nursing facility;Supervision/Assistance - 24 hour    Barriers to Discharge        Equipment Recommendations  Rolling walker with 5" wheels;Defer to next venue    Recommendations for Other Services     Frequency Min 3X/week    Precautions / Restrictions Precautions Precautions: Fall Restrictions Weight Bearing Restrictions: No   Pertinent Vitals/Pain No c/o pain      Mobility  Bed Mobility Bed Mobility: Not assessed Transfers Transfers: Sit to Stand;Stand to Sit Sit to Stand: 2: Max assist;4: Min assist;With upper extremity assist;From chair/3-in-1 Stand to Sit: 2: Max assist;4: Min assist;With upper extremity assist;To chair/3-in-1 Details for Transfer Assistance: Completed sit <> stand x 4. With increased repititions, pt required less assistance for anterior translation and stability.  Ambulation/Gait Ambulation/Gait Assistance: Not tested (comment)    Exercises     PT Diagnosis: Difficulty walking;Generalized weakness  PT Problem List: Decreased strength;Decreased activity tolerance;Decreased balance;Decreased mobility;Decreased knowledge of use of DME;Decreased safety awareness;Decreased knowledge of precautions PT Treatment Interventions: DME instruction;Gait training;Functional mobility training;Therapeutic activities;Therapeutic exercise;Balance training;Patient/family education   PT Goals Acute Rehab PT Goals PT Goal Formulation: With patient Time For Goal Achievement:  01/03/12 Potential to Achieve Goals: Fair Pt will go Supine/Side to Sit: with modified independence PT Goal: Supine/Side to Sit - Progress: Goal set today Pt will go Sit to Supine/Side: with modified independence PT Goal: Sit to Supine/Side - Progress: Goal set today Pt will go Sit to Stand: with supervision PT Goal: Sit to Stand - Progress: Goal set today Pt will go Stand to Sit: with supervision PT Goal: Stand to Sit - Progress: Goal set today Pt will Transfer Bed to Chair/Chair to Bed: with min assist PT Transfer Goal: Bed to Chair/Chair to Bed - Progress: Goal set today Pt will Ambulate: 51 - 150 feet;with min assist;with least restrictive assistive device PT Goal: Ambulate - Progress: Goal set today  Visit Information  Last PT Received On: 12/20/11 Assistance Needed: +2    Subjective Data      Prior Functioning  Home Living Lives With: Spouse Available Help at Discharge: Family;Available 24 hours/day (cannot physically assist) Type of Home: House Home Access: Ramped entrance Home Layout: Two level;Able to live on main level with bedroom/bathroom Bathroom Shower/Tub: Walk-in shower;Door [employer] Toilet: Handicapped height Bathroom Accessibility: Yes How Accessible: Accessible via walker Home Adaptive Equipment: Shower chair with back Prior Function Level of Independence: Independent (intermittent RW) Able to Take Stairs?: No Driving: Yes Vocation: Retired Musician: No difficulties    Cognition  Overall Cognitive Status: Appears within functional limits for tasks assessed/performed Arousal/Alertness: Awake/alert Orientation Level: Appears intact for tasks assessed Behavior During Session: [facility] for tasks performed    Extremity/Trunk Assessment Right Lower Extremity Assessment RLE ROM/Strength/Tone: Deficits RLE ROM/Strength/Tone Deficits: Grossly 3+-4/5 RLE Sensation: WFL - Light Touch Left Lower Extremity Assessment LLE ROM/Strength/Tone:  Deficits LLE ROM/Strength/Tone Deficits: Grossly 3+-4/5 LLE Sensation: WFL - Light Touch   Balance Balance Balance Assessed: Yes Static Standing Balance Static Standing - Balance Support: Bilateral upper extremity  supported Static Standing - Level of Assistance: 3: Mod assist Static Standing - Comment/# of Minutes: Pt stood at [facility] for ~3 minutes with mod assist for stability secondary to LE weakness  End of Session PT - End of Session Equipment Utilized During Treatment: Gait belt Activity Tolerance: Patient limited by fatigue Patient left: in chair;with call bell/phone within reach Nurse Communication: Mobility status    Milana Kidney 12/20/2011, 5:30 PM  12/20/2011 Milana Kidney DPT PAGER: [phone] OFFICE: [phone]

## 2011-12-20 NOTE — Clinical Social Work Psychosocial (Signed)
Clinical Social Work Department BRIEF PSYCHOSOCIAL ASSESSMENT 12/20/2011  Patient:  [name], [name]     Account Number:  [id number]     Admit date:  12/18/2011  Clinical Social Worker:  Juliette Mangle  Date/Time:  12/20/2011 02:14 PM  Referred by:  RN  Date Referred:  12/20/2011 Referred for  SNF Placement   Other Referral:   Interview type:  Patient Other interview type:    PSYCHOSOCIAL DATA Living Status:  FAMILY Admitted from facility:   Level of care:   Primary support name:  June Poteet Primary support relationship to patient:  SPOUSE Degree of support available:   Strong and vested    CURRENT CONCERNS Current Concerns  Post-Acute Placement   Other Concerns:    SOCIAL WORK ASSESSMENT / PLAN Clinical Social Worker received referral for the potential need for post acute placement.  CSW reviewed chart and met with patient at bedside. CSW introduced self, explained role, and provided emotional support. CSW discussed skilled nursing home placement and reviewed the process of placement and answered all the patient's questions. Patient reported that he is agreeable to CSW seeking placement but wants to discuss options with his wife.  CSW encouraged patient to  ask questions as needed of CSW and  medical staff.  CSW will begin the placement process and will continue to follow and assist with all d/c planning.   Assessment/plan status:  Psychosocial Support/Ongoing Assessment of Needs Other assessment/ plan:   Information/referral to community resources:   CSW provided patient with SNF choice list and CSW's contact information    PATIENT'S/FAMILY'S RESPONSE TO PLAN OF CARE: Patient was very appreciative of support and information provided by CSW. CSW will start placement process and will assist with all d/c needs.     Sabino Niemann, MSW, [employer] [phone]

## 2011-12-20 NOTE — Progress Notes (Signed)
[facility] PROGRESS NOTE  Vincent [name] JWJ:[id number] DOB: 1935/11/05 DOA: 12/18/2011 PCP: [name], MD  Assessment/Plan: Acute on chronic CHF exacerbation-appears to be diastolic at this point  -Decrease furosemide to 40 mg by mouth twice a day starting tomorrow -Hold the evening dose of intravenous Lasix -Hold on ACE inhibitor at this time due to his renal insufficiency  -Increase carvedilol to 12.5 mg twice a day  -His weight has decreased by 1 kg  Paroxysmal Atrial fibrillation with rapid ventricular response  -Rate controlled with carvedilol -Echocardiogram shows EF 45-50%, TSH 2.090  -INR is subtherapeutic at this time, continue warfarin Diabetes mellitus type 2, insulin-dependent  -Hemoglobin A1c 8.8, LDL 70  -Increase 70/30 insulin to 50 units in the morning, 40 units with evening meal  Bacteremia -This is likely the source of the patient's CHF decompensation -Continue vancomycin pending culture data -Surveillance blood cultures in the morning Medical noncompliance  -After conversation with the patient's daughter, it appears that the patient has difficulty with following up his outpatient appointments as well as taking his medications properly  Deconditioning  -Order PT evaluation  Hypokalemia  -Will replete    Procedures/Studies: Dg Chest Port 1 View  12/18/2011  *RADIOLOGY REPORT*  Clinical Data: Atrial fibrillation.  High blood pressure. Diabetes. Shortness of breath.  PORTABLE CHEST - 1 VIEW  Comparison: 03/02/2011.  Findings: Remote right rib fractures.  Granuloma right upper lobe unchanged since 2008.  Cardiomegaly.  Pulmonary vascular prominence most notable centrally.  No segmental consolidation or gross pneumothorax.  The patient would eventually benefit from follow-up two-view chest with cardiac leads removed.  IMPRESSION: Cardiomegaly.  Pulmonary vascular prominence most notable centrally.  Calcified slightly tortuous aorta.  Stable granuloma right  upper lobe.   Original Report Authenticated By: Fuller Canada, M.D.      Antibiotics:  Vancomycin September 5>>>   Code Status: Full Family Communication: Daughter Annabelle Harman) [phone] Disposition Plan: SNF when medically stable  Subjective: Patient is feeling much better. He is breathing 75% better. He denies any dizziness, headache, chest pain, nausea, vomiting, diarrhea, abdominal pain. There is no tissue area. He denies any fevers or chills.  Objective: Filed Vitals:   12/19/11 2111 12/20/11 0355 12/20/11 0450 12/20/11 1019  BP: 152/67 152/81  164/84  Pulse: 84 96  78  Temp: 99.7 F (37.6 C) 98.3 F (36.8 C)    TempSrc: Oral Oral    Resp: 20 18    Height:      Weight:   117.5 kg (259 lb 0.7 oz)   SpO2: 96% 96%      Intake/Output Summary (Last 24 hours) at 12/20/11 1253 Last data filed at 12/20/11 0900  Gross per 24 hour  Intake    600 ml  Output   1250 ml  Net   -650 ml   Weight change: -1.9 kg (-4 lb 3 oz) Exam:   General:  Pt is alert, follows commands appropriately, not in acute distress  HEENT: No icterus, No thrush, , [place]/AT  Cardiovascular: IRRR, S1/S2, no rubs, no gallops  Respiratory: Fine basilar crackles, good air movement, no wheezing,  no rhonchi  Abdomen: Soft, non tender, non distended, bowel sounds present, no guarding  Extremities: 1+ edema, No lymphangitis, No petechiae, No rashes, no synovitis  Data Reviewed: Basic Metabolic Panel:  Lab 12/20/11 5784 12/19/11 0655 12/18/11 0955  NA 137 133* 134*  K 3.3* 3.3* 3.5  CL 97 94* 95*  CO2 29 27 28   GLUCOSE 144* 300* 354*  BUN 20 20 18   CREATININE 1.92* 1.78* 1.73*  CALCIUM 9.0 8.9 9.4  MG 1.6 -- --  PHOS -- -- --   Liver Function Tests:  Lab 12/18/11 0955  AST 19  ALT 16  ALKPHOS 82  BILITOT 1.4*  PROT 8.4*  ALBUMIN 3.3*   No results found for this basename: LIPASE:5,AMYLASE:5 in the last 168 hours No results found for this basename: AMMONIA:5 in the last 168  hours CBC:  Lab 12/20/11 0640 12/19/11 0655 12/18/11 0955  WBC 13.6* 12.3* 13.5*  NEUTROABS -- 9.3* 10.1*  HGB 11.2* 9.6* 11.7*  HCT 34.2* 29.8* 36.2*  MCV 96.3 97.7 97.6  PLT 213 190 205   Cardiac Enzymes:  Lab 12/18/11 2211 12/18/11 1655  CKTOTAL -- --  CKMB -- --  CKMBINDEX -- --  TROPONINI <0.30 <0.30   BNP: No components found with this basename: POCBNP:5 CBG:  Lab 12/20/11 1119 12/20/11 0601 12/19/11 2109 12/19/11 1636 12/19/11 1234  GLUCAP 242* 95 230* 250* 285*    Recent Results (from the past 240 hour(s))  URINE CULTURE     Status: Normal   Collection Time   12/18/11 10:38 AM      Component Value Range Status Comment   Specimen Description URINE, CLEAN CATCH   Final    Special Requests NONE   Final    Culture  Setup Time 12/18/2011 11:50   Final    Colony Count 3,000 COLONIES/ML   Final    Culture INSIGNIFICANT GROWTH   Final    Report Status 12/19/2011 FINAL   Final   CULTURE, BLOOD (ROUTINE X 2)     Status: Normal (Preliminary result)   Collection Time   12/18/11  4:35 PM      Component Value Range Status Comment   Specimen Description BLOOD LEFT HAND   Final    Special Requests BOTTLES DRAWN AEROBIC ONLY [facility]   Final    Culture  Setup Time 12/18/2011 22:56   Final    Culture     Final    Value: GRAM POSITIVE COCCI IN CLUSTERS     5 Note: Gram Stain Report Called to,Read Back By and Verified With: NURSE RASHIDA HANEY AT 6:13PM 9 2013 BY THOMI   Report Status PENDING   Incomplete   CULTURE, BLOOD (ROUTINE X 2)     Status: Normal (Preliminary result)   Collection Time   12/18/11  4:45 PM      Component Value Range Status Comment   Specimen Description BLOOD LEFT HAND   Final    Special Requests BOTTLES DRAWN AEROBIC ONLY 5CC   Final    Culture  Setup Time 12/18/2011 22:57   Final    Culture     Final    Value: GRAM POSITIVE COCCI IN CLUSTERS     Note: Gram Stain Report Called to,Read Back By and Verified With: COURTNEY GENTRY 12/19/11 1410 BY SMITJ   Report  Status PENDING   Incomplete      Scheduled Meds:   . allopurinol  150 mg Oral Daily  . carvedilol  12.5 mg Oral BID WC  . enoxaparin  40 mg Subcutaneous Q24H  . furosemide  80 mg Intravenous Q12H  . hydrocortisone  10 mg Oral Daily  . insulin aspart  0-20 Units Subcutaneous TID WC  . insulin aspart  0-5 Units Subcutaneous QHS  . insulin aspart protamine-insulin aspart  40 Units Subcutaneous BID PC  . ketoconazole  200 mg Oral Daily  .  neomycin-bacitracin-polymyxin   Topical Daily  . sodium chloride  3 mL Intravenous Q12H  . vancomycin  1,500 mg Intravenous Q24H  . vancomycin  2,500 mg Intravenous Once  . warfarin  7.5 mg Oral ONCE-1800  . warfarin  9 mg Oral ONCE-1800  . Warfarin - Pharmacist Dosing Inpatient   Does not apply q1800   Continuous Infusions:   . DISCONTD: diltiazem (CARDIZEM) infusion 10 mg/hr (12/19/11 1414)     Tiannah Greenly, DO  [facility] Pager [phone]  If 7PM-7AM, please contact night-coverage [email or web address] Password [email or web address] 12/20/2011, 12:53 PM   LOS: 2 days

## 2011-12-20 NOTE — Care Management Note (Signed)
    Page 1 of 2   12/20/2011     11:46:33 AM   CARE MANAGEMENT NOTE 12/20/2011  Patient:  Vincent [name], Vincent [name]   Account Number:  [id number]  Date Initiated:  12/19/2011  Documentation initiated by:  Tera Mater  Subjective/Objective Assessment:   76yo male admitted with CHF.  Pt. lives at home with spouse and grandchildren.     Action/Plan:   Discharge planning for [facility] services   Anticipated DC Date:  12/22/2011   Anticipated DC Plan:  HOME W HOME HEALTH SERVICES  In-house referral  Clinical Social Worker      DC Planning Services  CM consult      [facility] Choice  HOME HEALTH   Choice offered to / List presented to:  C-1 Patient        HH arranged  HH-10 DISEASE MANAGEMENT  HH-1 RN      [facility] agency  [facility]   Status of service:  In process, will continue to follow Medicare Important Message given?   (If response is "NO", the following Medicare IM given date fields will be blank) Date Medicare IM given:   Date Additional Medicare IM given:    Discharge Disposition:    Per UR Regulation:  Reviewed for med. necessity/level of care/duration of stay  If discussed at Long Length of Stay Meetings, dates discussed:    Comments:  12/20/11 0915 Tera Mater, RN, BSN NCM [phone] CSW consulted for possible SNF placement at discharge.    12/19/11 1130 Tera Mater, RN, BSN NCM [phone] In to complete HF screen for pt.  Pt. states that he tries to remember to weigh himself everyday.  Spoke with pt. about HH services and pt. stated that he had used [facility] in past and was satisfied with their services. Pt. does not have any difficulty obtaining his medications. TC to Hilda Lias, with [facility] to make referral for [facility] RN for HF management.  NCM to follow for further discharge needs.

## 2011-12-20 NOTE — Clinical Social Work Placement (Addendum)
Clinical Social Work Department CLINICAL SOCIAL WORK PLACEMENT NOTE 12/20/2011  Patient:  Vincent [name], Vincent [name]  Account Number:  [id number] Admit date:  12/18/2011  Clinical Social Worker:  AMY Lubertha Basque  Date/time:  12/20/2011 03:37 PM  Clinical Social Work is seeking post-discharge placement for this patient at the following level of care:   SKILLED NURSING   (*CSW will update this form in Epic as items are completed)   12/20/2011  Patient/family provided with [facility] Department of Clinical Social Work's list of facilities offering this level of care within the geographic area requested by the patient (or if unable, by the patient's family).  12/20/2011  Patient/family informed of their freedom to choose among providers that offer the needed level of care, that participate in Medicare, Medicaid or managed care program needed by the patient, have an available bed and are willing to accept the patient.  12/20/2011  Patient/family informed of [facility]' ownership interest in [facility], as well as of the fact that they are under no obligation to receive care at this facility.  PASARR submitted to EDS on 12/20/2011 PASARR number received from EDS on 12/20/2011  FL2 transmitted to all facilities in geographic area requested by pt/family on  12/20/2011 FL2 transmitted to all facilities within larger geographic area on   Patient informed that his/her managed care company has contracts with or will negotiate with  certain facilities, including the following:     Patient/family informed of bed offers received:  12/22/2011 Patient chooses bed at  Physician recommends and patient chooses bed at    Patient to be transferred to  on   Patient to be transferred to facility by   The following physician request were entered in Epic:   Additional Comments:  Sabino Niemann, MSW, LCSWA [phone]

## 2011-12-20 NOTE — Progress Notes (Addendum)
ANTICOAGULATION CONSULT NOTE - Follow-Up Consult  Pharmacy Consult for Warfarin Indication: atrial fibrillation  No Known Allergies  Patient Measurements: Height: 5\' 9"  (175.3 cm) Weight: 259 lb 0.7 oz (117.5 kg) (bedscale) IBW/kg (Calculated) : 70.7   Vital Signs: Temp: 98.3 F (36.8 C) (09/06 0355) Temp src: Oral (09/06 0355) BP: 152/81 mmHg (09/06 0355) Pulse Rate: 96  (09/06 0355)  Labs:  Basename 12/20/11 0640 12/19/11 0655 12/18/11 2211 12/18/11 1655 12/18/11 0955  HGB 11.2* 9.6* -- -- --  HCT 34.2* 29.8* -- -- 36.2*  PLT 213 190 -- -- 205  APTT -- -- -- -- 31  LABPROT 19.1* 17.4* -- -- 17.1*  INR 1.57* 1.40 -- -- 1.37  HEPARINUNFRC -- -- -- -- --  CREATININE 1.92* 1.78* -- -- 1.73*  CKTOTAL -- -- -- -- --  CKMB -- -- -- -- --  TROPONINI -- -- <0.30 <0.30 --    Estimated Creatinine Clearance: 42.7 ml/min (by C-G formula based on Cr of 1.92).  Assessment: 76 y.o. M admitted to [facility] on 9/4 with an acute on chronic CHF exacerbation. Pt on coumadin PTA for afib. (5 mg daily EXCEPT for 2.5 mg on Mon/Fri). INR remains SUBtherapeutic. The patient is also on low-dose lovenox for VTE px while INR SUBtherapeutic.  The patient was on ketoconazole PTA -- but pt states he was only taking 400mg  po BID most days because he is on so many meds even though he is supposed to be taking 400mg  po TID (verified this dose with Dr. Orvilla Cornwall office - oncologist). Will need to watch INR carefully with increase of dose back to 400mg  po TID for prostate cancer.   Goal of Therapy:  INR 2-3   Plan:  1. Warfarin 7.5 mg x 1 dose at 1800 today 2. Daily PT/INR 3. Will plan to discontinue lovenox when INR >/= 2.  4. Will continue to monitor for any signs/symptoms of bleeding and will follow up with PT/INR in the a.m.   Christoper Fabian, PharmD, BCPS Clinical pharmacist, pager [phone] 12/20/2011 10:03 AM

## 2011-12-20 NOTE — Clinical Documentation Improvement (Signed)
CKD DOCUMENTATION CLARIFICATION QUERY   THIS DOCUMENT IS NOT A PERMANENT PART OF THE MEDICAL RECORD  Please update your documentation within the medical record to reflect your response to this query.                                                                                         12/20/11   Dr. Arbutus Leas and/or Associates,  In a better effort to capture your patient's severity of illness, reflect appropriate length of stay and utilization of resources, a review of the patient medical record has revealed the following indicators:  GFR  (black male) per [facility] 2008   GFR 33 2009   GFR 49 - >60 2011   GFR 52 - 60 2012   GFR 32 - 58 2013   GFR 43, 42, 38    History of Diabetes and Hypertension   Based on your clinical judgment, please document in the progress notes and discharge summary if a condition below provides greater specificity regarding the patient's renal function:   - CKD Stage I -  GFR > OR = 90  - CKD Stage II - GFR 60-89  - CKD Stage III - GFR 30-59  - CKD Stage IV - GFR 15-29  - CKD Stage V - GFR < 15  - ESRD (End Stage Renal Disease)  - Other Condition  - Unable to Clinically Determine    In responding to this query please exercise your independent judgment.    The fact that a query is asked, does not imply that any particular answer is desired or expected.   Reviewed: 12/23/11 - stage 3 ckd documented by dr. Arbutus Leas 12/21/11 pn.  Mathis Dad RN   Thank You,  Jerral Ralph  RN SN CCDS Certified Clinical Documentation Specialist: Cell   [phone]  Health Information Management [place]  TO RESPOND TO THE THIS QUERY, FOLLOW THE INSTRUCTIONS BELOW:  1. If needed, update documentation for the patient's encounter via the notes activity.  2. Access this query again and click edit on the In [employer].  3. After updating, or not, click F2 to complete all highlighted (required) fields concerning your review. Select "additional documentation in the  medical record" OR "no additional documentation provided".  4. Click Sign note button.  5. The deficiency will fall out of your In Basket *Please let us know if you are not able to complete this workflow by phone or e-mail (listed below).

## 2011-12-21 ENCOUNTER — Inpatient Hospital Stay (HOSPITAL_COMMUNITY): Payer: Medicare Other

## 2011-12-21 LAB — BASIC METABOLIC PANEL
BUN: 27 mg/dL — ABNORMAL HIGH (ref 6–23)
Creatinine, Ser: 1.99 mg/dL — ABNORMAL HIGH (ref 0.50–1.35)
GFR calc Af Amer: 36 mL/min — ABNORMAL LOW (ref >90)
GFR calc non Af Amer: 31 mL/min — ABNORMAL LOW (ref >90)
Glucose, Bld: 91 mg/dL (ref 70–99)
Potassium: 3.7 mEq/L (ref 3.5–5.1)

## 2011-12-21 LAB — GLUCOSE, CAPILLARY: Glucose-Capillary: 159 mg/dL — ABNORMAL HIGH (ref 70–99)

## 2011-12-21 LAB — CBC
HCT: 33.4 % — ABNORMAL LOW (ref 39.0–52.0)
MCH: 31.7 pg (ref 26.0–34.0)
MCV: 97.1 fL (ref 78.0–100.0)
Platelets: 194 10*3/uL (ref 150–400)
RDW: 14.6 % (ref 11.5–15.5)
WBC: 11.2 10*3/uL — ABNORMAL HIGH (ref 4.0–10.5)

## 2011-12-21 MED ORDER — MAGNESIUM HYDROXIDE 400 MG/5ML PO SUSP
15.0000 mL | Freq: Every day | ORAL | Status: DC | PRN
  Administered 2011-12-21: 15 mL via ORAL
  Filled 2011-12-21 (×2): qty 30

## 2011-12-21 MED ORDER — WARFARIN SODIUM 5 MG PO TABS
5.0000 mg | ORAL_TABLET | Freq: Once | ORAL | Status: AC
  Administered 2011-12-21: 5 mg via ORAL
  Filled 2011-12-21: qty 1

## 2011-12-21 NOTE — Progress Notes (Signed)
[facility] PROGRESS NOTE  Vincent KIANG ZOX:[id number] DOB: Sep 25, 1935 DOA: 12/18/2011 PCP: [name], MD  Assessment/Plan:  Acute on chronic CHF exacerbation-appears to be diastolic at this point  -Continue furosemide 40 mg twice a day -Hold on ACE inhibitor at this time due to his renal insufficiency  -continue carvedilol to 12.5 mg twice a day  --1847 cc for the admission -Repeat chest x-ray CKD stage III -Baseline creatinine 1.7-1.9 Paroxysmal Atrial fibrillation with rapid ventricular response  -Rate controlled with carvedilol  -Echocardiogram shows EF 45-50%, TSH 2.090  -INR is subtherapeutic at this time, continue warfarin  Diabetes mellitus type 2, insulin-dependent  -Hemoglobin A1c 8.8, LDL 70  -Continue 70/30 insulin to 50 units in the morning, 40 units with evening meal  -Sugars gradually improving Bacteremia-coagulase negative Staphylococcus -This is likely the source of the patient's CHF decompensation  -Continue vancomycin pending final susceptibility data  -Followup surveillance blood cultures Medical noncompliance  -After conversation with the patient's daughter, it appears that the patient has difficulty with following up his outpatient appointments as well as taking his medications properly  Deconditioning  -PT evaluation, plan for skilled nursing facility Hypokalemia  -Improved    Procedures/Studies: Dg Chest Port 1 View  12/18/2011  *RADIOLOGY REPORT*  Clinical Data: Atrial fibrillation.  High blood pressure. Diabetes. Shortness of breath.  PORTABLE CHEST - 1 VIEW  Comparison: 03/02/2011.  Findings: Remote right rib fractures.  Granuloma right upper lobe unchanged since 2008.  Cardiomegaly.  Pulmonary vascular prominence most notable centrally.  No segmental consolidation or gross pneumothorax.  The patient would eventually benefit from follow-up two-view chest with cardiac leads removed.  IMPRESSION: Cardiomegaly.  Pulmonary vascular prominence  most notable centrally.  Calcified slightly tortuous aorta.  Stable granuloma right upper lobe.   Original Report Authenticated By: Fuller Canada, M.D.      Antibiotics:  Vancomycin September 5>>>> Code Status: Full  Family Communication: Daughter Annabelle Harman) [phone]  Disposition Plan: SNF when medically stable    Subjective: Patient is feeling better. He states that his legs are still fairly heavy. He denies any chest pain, shortness breath, nausea, vomiting, diarrhea, abdominal pain, fevers, chills  Objective: Filed Vitals:   12/21/11 0416 12/21/11 1015 12/21/11 1358 12/21/11 1517  BP: 138/69 129/77 108/80   Pulse: 98 97 91   Temp: 98.1 F (36.7 C)  99.6 F (37.6 C) 98.5 F (36.9 C)  TempSrc: Oral  Oral Oral  Resp: 18 18 18    Height:      Weight: 118.9 kg (262 lb 2 oz)     SpO2: 100%  100%     Intake/Output Summary (Last 24 hours) at 12/21/11 1646 Last data filed at 12/21/11 1359  Gross per 24 hour  Intake    700 ml  Output    500 ml  Net    200 ml   Weight change: 1.4 kg (3 lb 1.4 oz) Exam:   General:  Pt is alert, follows commands appropriately, not in acute distress  HEENT: No icterus, No thrush, , [place]/AT  Cardiovascular: IRRR, S1/S2, no rubs, no gallops  Respiratory: Clear to auscultation bilaterally, no wheezing, no crackles, no rhonchi  Abdomen: Soft, non tender, non distended, bowel sounds present, no guarding  Extremities: 2+ edema, No lymphangitis, No petechiae, No rashes, no synovitis  Data Reviewed: Basic Metabolic Panel:  Lab 12/21/11 8295 12/20/11 0640 12/19/11 0655 12/18/11 0955  NA 140 137 133* 134*  K 3.7 3.3* 3.3* 3.5  CL 100 97 94* 95*  CO2 32 29 27 28   GLUCOSE 91 144* 300* 354*  BUN 27* 20 20 18   CREATININE 1.99* 1.92* 1.78* 1.73*  CALCIUM 9.4 9.0 8.9 9.4  MG -- 1.6 -- --  PHOS -- -- -- --   Liver Function Tests:  Lab 12/18/11 0955  AST 19  ALT 16  ALKPHOS 82  BILITOT 1.4*  PROT 8.4*  ALBUMIN 3.3*   No results found for  this basename: LIPASE:5,AMYLASE:5 in the last 168 hours No results found for this basename: AMMONIA:5 in the last 168 hours CBC:  Lab 12/21/11 0550 12/20/11 0640 12/19/11 0655 12/18/11 0955  WBC 11.2* 13.6* 12.3* 13.5*  NEUTROABS -- -- 9.3* 10.1*  HGB 10.9* 11.2* 9.6* 11.7*  HCT 33.4* 34.2* 29.8* 36.2*  MCV 97.1 96.3 97.7 97.6  PLT 194 213 190 205   Cardiac Enzymes:  Lab 12/18/11 2211 12/18/11 1655  CKTOTAL -- --  CKMB -- --  CKMBINDEX -- --  TROPONINI <0.30 <0.30   BNP: No components found with this basename: POCBNP:5 CBG:  Lab 12/21/11 1115 12/21/11 0630 12/20/11 2134 12/20/11 1601 12/20/11 1119  GLUCAP 174* 87 159* 139* 242*    Recent Results (from the past 240 hour(s))  URINE CULTURE     Status: Normal   Collection Time   12/18/11 10:38 AM      Component Value Range Status Comment   Specimen Description URINE, CLEAN CATCH   Final    Special Requests NONE   Final    Culture  Setup Time 12/18/2011 11:50   Final    Colony Count 3,000 COLONIES/ML   Final    Culture INSIGNIFICANT GROWTH   Final    Report Status 12/19/2011 FINAL   Final   CULTURE, BLOOD (ROUTINE X 2)     Status: Normal (Preliminary result)   Collection Time   12/18/11  4:35 PM      Component Value Range Status Comment   Specimen Description BLOOD LEFT HAND   Final    Special Requests BOTTLES DRAWN AEROBIC ONLY 7CC   Final    Culture  Setup Time 12/18/2011 22:56   Final    Culture     Final    Value: STAPHYLOCOCCUS SPECIES (COAGULASE NEGATIVE)     Note: RIFAMPIN AND GENTAMICIN SHOULD NOT BE USED AS SINGLE DRUGS FOR TREATMENT OF STAPH INFECTIONS.     5 Note: Gram Stain Report Called to,Read Back By and Verified With: NURSE RASHIDA HANEY AT 6:13PM 9 2013 BY THOMI   Report Status PENDING   Incomplete   CULTURE, BLOOD (ROUTINE X 2)     Status: Normal (Preliminary result)   Collection Time   12/18/11  4:45 PM      Component Value Range Status Comment   Specimen Description BLOOD LEFT HAND   Final    Special  Requests BOTTLES DRAWN AEROBIC ONLY 5CC   Final    Culture  Setup Time 12/18/2011 22:57   Final    Culture     Final    Value: STAPHYLOCOCCUS SPECIES (COAGULASE NEGATIVE)     Note: SUSCEPTIBILITIES PERFORMED ON PREVIOUS CULTURE WITHIN THE LAST 5 DAYS.     Note: Gram Stain Report Called to,Read Back By and Verified With: COURTNEY GENTRY 12/19/11 1410 BY SMITJ   Report Status PENDING   Incomplete      Scheduled Meds:   . allopurinol  150 mg Oral Daily  . carvedilol  12.5 mg Oral BID WC  . enoxaparin  40 mg Subcutaneous  Q24H  . furosemide  40 mg Oral BID  . furosemide  40 mg Oral Once  . hydrocortisone  20 mg Oral BID  . insulin aspart  0-20 Units Subcutaneous TID WC  . insulin aspart  0-5 Units Subcutaneous QHS  . insulin aspart protamine-insulin aspart  40 Units Subcutaneous q1800  . insulin aspart protamine-insulin aspart  50 Units Subcutaneous Q breakfast  . ketoconazole  400 mg Oral TID  . neomycin-bacitracin-polymyxin   Topical Daily  . sodium chloride  3 mL Intravenous Q12H  . vancomycin  1,500 mg Intravenous Q24H  . warfarin  5 mg Oral ONCE-1800  . warfarin  7.5 mg Oral ONCE-1800  . Warfarin - Pharmacist Dosing Inpatient   Does not apply q1800   Continuous Infusions:    Aracelis Ulrey, DO  [facility] Pager [phone]  If 7PM-7AM, please contact night-coverage [email or web address] Password [email or web address] 12/21/2011, 4:46 PM   LOS: 3 days

## 2011-12-21 NOTE — Progress Notes (Signed)
PT has not had a BM since 9-4, ordered MOM per RN standing order. Will continue to monitor

## 2011-12-21 NOTE — Progress Notes (Signed)
ANTICOAGULATION CONSULT NOTE - Follow-Up Consult  Pharmacy Consult for Warfarin Indication: atrial fibrillation  No Known Allergies  Patient Measurements: Height: 5\' 9"  (175.3 cm) Weight: 262 lb 2 oz (118.9 kg) IBW/kg (Calculated) : 70.7   Vital Signs: Temp: 98.1 F (36.7 C) (09/07 0416) Temp src: Oral (09/07 0416) BP: 129/77 mmHg (09/07 1015) Pulse Rate: 97  (09/07 1015)  Labs:  Alvira Philips 12/21/11 0550 12/20/11 0640 12/19/11 0655 12/18/11 2211 12/18/11 1655  HGB 10.9* 11.2* -- -- --  HCT 33.4* 34.2* 29.8* -- --  PLT 194 213 190 -- --  APTT -- -- -- -- --  LABPROT 20.5* 19.1* 17.4* -- --  INR 1.72* 1.57* 1.40 -- --  HEPARINUNFRC -- -- -- -- --  CREATININE 1.99* 1.92* 1.78* -- --  CKTOTAL -- -- -- -- --  CKMB -- -- -- -- --  TROPONINI -- -- -- <0.30 <0.30    Estimated Creatinine Clearance: 41.5 ml/min (by C-G formula based on Cr of 1.99).  Assessment: 76 y.o. M admitted to [facility] on 9/4 with an acute on chronic CHF exacerbation. Pt on coumadin PTA for afib. (5 mg daily EXCEPT for 2.5 mg on Mon/Fri). INR remains SUBtherapeutic but trending up. The patient is also on low-dose lovenox for VTE px while INR SUBtherapeutic.  The patient was on ketoconazole PTA --noted drug interaction with ketoconazole. Consider d/c this medication, see BLACK BOX Warning in sticky notes.   Goal of Therapy:  INR 2-3   Plan:  1. Coumadin 5 mg x 1 dose at 1800 today 2. Daily PT/INR 3. Will plan to discontinue lovenox when INR >/= 2.  4. Will continue to monitor for any signs/symptoms of bleeding and will follow up with PT/INR in the a.m.   Verlene Mayer, PharmD, BCPS Pager [phone] 12/21/2011 12:05 PM

## 2011-12-21 NOTE — Progress Notes (Addendum)
IV vanc pause d/t IV leak about 175cc left in 500cc bag. Not sure much leaked, bed sheets wet. Marland Kitchen PAge IV team to rest IV

## 2011-12-22 LAB — PROTIME-INR: Prothrombin Time: 23.1 seconds — ABNORMAL HIGH (ref 11.6–15.2)

## 2011-12-22 LAB — CULTURE, BLOOD (ROUTINE X 2)

## 2011-12-22 LAB — URINALYSIS, ROUTINE W REFLEX MICROSCOPIC
Leukocytes, UA: NEGATIVE
Nitrite: NEGATIVE
Specific Gravity, Urine: 1.018 (ref 1.005–1.030)
pH: 5.5 (ref 5.0–8.0)

## 2011-12-22 LAB — GLUCOSE, CAPILLARY
Glucose-Capillary: 164 mg/dL — ABNORMAL HIGH (ref 70–99)
Glucose-Capillary: 218 mg/dL — ABNORMAL HIGH (ref 70–99)

## 2011-12-22 LAB — BASIC METABOLIC PANEL
BUN: 30 mg/dL — ABNORMAL HIGH (ref 6–23)
Calcium: 9.5 mg/dL (ref 8.4–10.5)
Creatinine, Ser: 2.16 mg/dL — ABNORMAL HIGH (ref 0.50–1.35)
GFR calc Af Amer: 33 mL/min — ABNORMAL LOW (ref >90)
GFR calc non Af Amer: 28 mL/min — ABNORMAL LOW (ref >90)
Glucose, Bld: 52 mg/dL — ABNORMAL LOW (ref 70–99)

## 2011-12-22 LAB — CBC
MCH: 31.2 pg (ref 26.0–34.0)
MCV: 96.6 fL (ref 78.0–100.0)
Platelets: 283 10*3/uL (ref 150–400)
RDW: 14.8 % (ref 11.5–15.5)
WBC: 16.6 10*3/uL — ABNORMAL HIGH (ref 4.0–10.5)

## 2011-12-22 LAB — URINE MICROSCOPIC-ADD ON

## 2011-12-22 MED ORDER — SENNOSIDES-DOCUSATE SODIUM 8.6-50 MG PO TABS
1.0000 | ORAL_TABLET | Freq: Two times a day (BID) | ORAL | Status: DC
  Administered 2011-12-22 – 2011-12-27 (×9): 1 via ORAL
  Filled 2011-12-22 (×11): qty 1

## 2011-12-22 MED ORDER — FUROSEMIDE 10 MG/ML IJ SOLN: 80.0000 mg | Freq: Once | INTRAMUSCULAR | Status: DC

## 2011-12-22 MED ORDER — CEFAZOLIN SODIUM-DEXTROSE 2-3 GM-% IV SOLR
2.0000 g | Freq: Three times a day (TID) | INTRAVENOUS | Status: DC
  Administered 2011-12-22 – 2011-12-27 (×15): 2 g via INTRAVENOUS
  Filled 2011-12-22 (×17): qty 50

## 2011-12-22 MED ORDER — MAGNESIUM HYDROXIDE 400 MG/5ML PO SUSP: 30.0000 mL | Freq: Every day | ORAL | Status: DC | PRN

## 2011-12-22 MED ORDER — CEFAZOLIN SODIUM 1-5 GM-% IV SOLN
1.0000 g | Freq: Three times a day (TID) | INTRAVENOUS | Status: DC
  Administered 2011-12-22: 1 g via INTRAVENOUS
  Filled 2011-12-22 (×3): qty 50

## 2011-12-22 MED ORDER — FUROSEMIDE 10 MG/ML IJ SOLN
120.0000 mg | Freq: Once | INTRAVENOUS | Status: AC
  Administered 2011-12-22: 120 mg via INTRAVENOUS
  Filled 2011-12-22 (×3): qty 12

## 2011-12-22 MED ORDER — INSULIN ASPART PROT & ASPART (70-30 MIX) 100 UNIT/ML ~~LOC~~ SUSP
25.0000 [IU] | Freq: Every day | SUBCUTANEOUS | Status: DC
  Administered 2011-12-23 – 2011-12-24 (×2): 25 [IU] via SUBCUTANEOUS
  Filled 2011-12-22: qty 0.25
  Filled 2011-12-22: qty 3
  Filled 2011-12-22: qty 0.25

## 2011-12-22 MED ORDER — INSULIN ASPART PROT & ASPART (70-30 MIX) 100 UNIT/ML ~~LOC~~ SUSP
20.0000 [IU] | Freq: Every day | SUBCUTANEOUS | Status: DC
  Administered 2011-12-22 – 2011-12-23 (×2): 20 [IU] via SUBCUTANEOUS
  Filled 2011-12-22: qty 3

## 2011-12-22 MED ORDER — WARFARIN SODIUM 5 MG PO TABS
5.0000 mg | ORAL_TABLET | Freq: Once | ORAL | Status: AC
  Administered 2011-12-22: 5 mg via ORAL
  Filled 2011-12-22: qty 1

## 2011-12-22 NOTE — Progress Notes (Signed)
[facility] PROGRESS NOTE  Vincent [name] MWU:[id number] DOB: March 04, 1936 DOA: 12/18/2011 PCP: [name], MD  Assessment/Plan:  Acute on chronic CHF exacerbation-appears to be diastolic at this point  -Continue furosemide 40 mg twice a day  -Hold on ACE inhibitor at this time due to his renal insufficiency  -continue carvedilol to 12.5 mg twice a day  -neg 2384 cc for the admission  -Clinically still fluid overloaded, will give one-time dose of furosemide 80 mg x1 Cardiorenal syndrome -Monitor creatinine closely CKD stage III  -Baseline creatinine 1.7-1.9  Paroxysmal Atrial fibrillation with rapid ventricular response  -Rate controlled with carvedilol  -Echocardiogram shows EF 45-50%, TSH 2.090  - continue warfarin per pharmacy dosing  Diabetes mellitus type 2, insulin-dependent  -Hemoglobin A1c 8.8, LDL 70  -Patient had hypoglycemia this morning September 8 -Decrease 70/30 insulin morning dose and evening dose by one half Bacteremia-coagulase negative Staphylococcus  -This is likely the source of the patient's CHF decompensation  -Patient denies any prosthetic devices or plates or screws -Discontinue vancomycin, start cefazolin -Organism is sensitive to oxacillin Medical noncompliance  -After conversation with the patient's daughter, it appears that the patient has difficulty with following up his outpatient appointments as well as taking his medications properly  Deconditioning  -PT evaluation, plan for skilled nursing facility  Hypokalemia  -Improved Constipation -Start Senokot and Colace  Procedures/Studies: Dg Chest Port 1 View  12/21/2011  *RADIOLOGY REPORT*  Clinical Data: 76 year old male with congestive heart failure. Shortness of breath.  PORTABLE CHEST - 1 VIEW  Comparison: 12/18/2011 and earlier.  Findings: AP portable semi upright view 1804 hours.  Stable lung volumes.  Stable cardiac size and mediastinal contours.  No pneumothorax.  No large effusion.   Retrocardiac streaky opacity compatible with atelectasis.  No overt pulmonary edema.  Chronic right lateral rib fractures.  IMPRESSION: Retrocardiac atelectasis, otherwise no acute cardiopulmonary abnormality.   Original Report Authenticated By: Harley Hallmark, M.D.    Dg Chest Port 1 View  12/18/2011  *RADIOLOGY REPORT*  Clinical Data: Atrial fibrillation.  High blood pressure. Diabetes. Shortness of breath.  PORTABLE CHEST - 1 VIEW  Comparison: 03/02/2011.  Findings: Remote right rib fractures.  Granuloma right upper lobe unchanged since 2008.  Cardiomegaly.  Pulmonary vascular prominence most notable centrally.  No segmental consolidation or gross pneumothorax.  The patient would eventually benefit from follow-up two-view chest with cardiac leads removed.  IMPRESSION: Cardiomegaly.  Pulmonary vascular prominence most notable centrally.  Calcified slightly tortuous aorta.  Stable granuloma right upper lobe.   Original Report Authenticated By: Fuller Canada, M.D.      Antibiotics:  Vancomycin September 5>>> September8  Cefazolin September 8>>> Code Status: Full  Family Communication: Daughter Annabelle Harman) [phone]  Disposition Plan: SNF when medically stable    Subjective: Patient states that he feels "washed out". He denies any chest pain, shortness of breath, nausea, vomiting, diarrhea, abdominal pain. He denies any dizziness. He states his breathing is better overall.  Objective: Filed Vitals:   12/21/11 2045 12/22/11 0501 12/22/11 1022 12/22/11 1438  BP: 139/79 133/89 121/69 117/70  Pulse: 85 117 62   Temp: 98.5 F (36.9 C) 97.2 F (36.2 C) 98.2 F (36.8 C) 98.4 F (36.9 C)  TempSrc: Oral Oral Oral Oral  Resp: 18 20 20 20   Height:      Weight:  120.2 kg (264 lb 15.9 oz)    SpO2: 100% 100% 100% 96%    Intake/Output Summary (Last 24 hours) at 12/22/11 1459 Last  data filed at 12/22/11 0942  Gross per 24 hour  Intake    483 ml  Output    900 ml  Net   -417 ml   Weight change:  1.3 kg (2 lb 13.9 oz) Exam:   General:  Pt is alert, follows commands appropriately, not in acute distress  HEENT: No icterus, No thrush, [place]/AT  Cardiovascular: IRRR, no rubs  Respiratory: Diminished breath sounds at the bases but no crackles, no wheezing, no crackles, no rhonchi  Abdomen: Soft, non tender, non distended, bowel sounds present, no guarding  Extremities: 2+ edema, No lymphangitis, No petechiae, No rashes, no synovitis  Data Reviewed: Basic Metabolic Panel:  Lab 12/22/11 8657 12/21/11 0550 12/20/11 0640 12/19/11 0655 12/18/11 0955  NA 138 140 137 133* 134*  K 3.6 3.7 3.3* 3.3* 3.5  CL 98 100 97 94* 95*  CO2 29 32 29 27 28   GLUCOSE 52* 91 144* 300* 354*  BUN 30* 27* 20 20 18   CREATININE 2.16* 1.99* 1.92* 1.78* 1.73*  CALCIUM 9.5 9.4 9.0 8.9 9.4  MG 2.1 -- 1.6 -- --  PHOS -- -- -- -- --   Liver Function Tests:  Lab 12/18/11 0955  AST 19  ALT 16  ALKPHOS 82  BILITOT 1.4*  PROT 8.4*  ALBUMIN 3.3*   No results found for this basename: LIPASE:5,AMYLASE:5 in the last 168 hours No results found for this basename: AMMONIA:5 in the last 168 hours CBC:  Lab 12/22/11 0600 12/21/11 0550 12/20/11 0640 12/19/11 0655 12/18/11 0955  WBC 16.6* 11.2* 13.6* 12.3* 13.5*  NEUTROABS -- -- -- 9.3* 10.1*  HGB 11.1* 10.9* 11.2* 9.6* 11.7*  HCT 34.4* 33.4* 34.2* 29.8* 36.2*  MCV 96.6 97.1 96.3 97.7 97.6  PLT 283 194 213 190 205   Cardiac Enzymes:  Lab 12/18/11 2211 12/18/11 1655  CKTOTAL -- --  CKMB -- --  CKMBINDEX -- --  TROPONINI <0.30 <0.30   BNP: No components found with this basename: POCBNP:5 CBG:  Lab 12/22/11 1108 12/22/11 0831 12/22/11 0654 12/22/11 0632 12/21/11 2145  GLUCAP 164* 157* 75 45* 174*    Recent Results (from the past 240 hour(s))  URINE CULTURE     Status: Normal   Collection Time   12/18/11 10:38 AM      Component Value Range Status Comment   Specimen Description URINE, CLEAN CATCH   Final    Special Requests NONE   Final    Culture   Setup Time 12/18/2011 11:50   Final    Colony Count 3,000 COLONIES/ML   Final    Culture INSIGNIFICANT GROWTH   Final    Report Status 12/19/2011 FINAL   Final   CULTURE, BLOOD (ROUTINE X 2)     Status: Normal   Collection Time   12/18/11  4:35 PM      Component Value Range Status Comment   Specimen Description BLOOD LEFT HAND   Final    Special Requests BOTTLES DRAWN AEROBIC ONLY 7CC   Final    Culture  Setup Time 12/18/2011 22:56   Final    Culture     Final    Value: STAPHYLOCOCCUS SPECIES (COAGULASE NEGATIVE)     Note: RIFAMPIN AND GENTAMICIN SHOULD NOT BE USED AS SINGLE DRUGS FOR TREATMENT OF STAPH INFECTIONS.     5 Note: Gram Stain Report Called to,Read Back By and Verified With: NURSE RASHIDA HANEY AT 6:13PM 9 2013 BY THOMI   Report Status 12/22/2011 FINAL   Final  Organism ID, Bacteria STAPHYLOCOCCUS SPECIES (COAGULASE NEGATIVE)   Final   CULTURE, BLOOD (ROUTINE X 2)     Status: Normal   Collection Time   12/18/11  4:45 PM      Component Value Range Status Comment   Specimen Description BLOOD LEFT HAND   Final    Special Requests BOTTLES DRAWN AEROBIC ONLY 5CC   Final    Culture  Setup Time 12/18/2011 22:57   Final    Culture     Final    Value: STAPHYLOCOCCUS SPECIES (COAGULASE NEGATIVE)     Note: SUSCEPTIBILITIES PERFORMED ON PREVIOUS CULTURE WITHIN THE LAST 5 DAYS.     Note: Gram Stain Report Called to,Read Back By and Verified With: COURTNEY GENTRY 12/19/11 1410 BY SMITJ   Report Status 12/22/2011 FINAL   Final   CULTURE, BLOOD (ROUTINE X 2)     Status: Normal (Preliminary result)   Collection Time   12/21/11  5:50 AM      Component Value Range Status Comment   Specimen Description BLOOD RIGHT ARM   Final    Special Requests BOTTLES DRAWN AEROBIC AND ANAEROBIC 10CC EACH   Final    Culture  Setup Time 12/21/2011 10:12   Final    Culture     Final    Value:        BLOOD CULTURE RECEIVED NO GROWTH TO DATE CULTURE WILL BE HELD FOR 5 DAYS BEFORE ISSUING A FINAL NEGATIVE REPORT     Report Status PENDING   Incomplete   CULTURE, BLOOD (ROUTINE X 2)     Status: Normal (Preliminary result)   Collection Time   12/21/11  6:05 AM      Component Value Range Status Comment   Specimen Description BLOOD LEFT HAND   Final    Special Requests BOTTLES DRAWN AEROBIC AND ANAEROBIC 10CC EACH   Final    Culture  Setup Time 12/21/2011 10:12   Final    Culture     Final    Value:        BLOOD CULTURE RECEIVED NO GROWTH TO DATE CULTURE WILL BE HELD FOR 5 DAYS BEFORE ISSUING A FINAL NEGATIVE REPORT   Report Status PENDING   Incomplete      Scheduled Meds:   . allopurinol  150 mg Oral Daily  . carvedilol  12.5 mg Oral BID WC  .  ceFAZolin (ANCEF) IV  1 g Intravenous Q8H  . furosemide  80 mg Intravenous Once  . furosemide  40 mg Oral BID  . hydrocortisone  20 mg Oral BID  . insulin aspart  0-20 Units Subcutaneous TID WC  . insulin aspart  0-5 Units Subcutaneous QHS  . insulin aspart protamine-insulin aspart  20 Units Subcutaneous Q supper  . insulin aspart protamine-insulin aspart  25 Units Subcutaneous Q breakfast  . ketoconazole  400 mg Oral TID  . neomycin-bacitracin-polymyxin   Topical Daily  . senna-docusate  1 tablet Oral BID  . sodium chloride  3 mL Intravenous Q12H  . warfarin  5 mg Oral ONCE-1800  . warfarin  5 mg Oral ONCE-1800  . Warfarin - Pharmacist Dosing Inpatient   Does not apply q1800  . DISCONTD: enoxaparin  40 mg Subcutaneous Q24H  . DISCONTD: insulin aspart protamine-insulin aspart  40 Units Subcutaneous q1800  . DISCONTD: insulin aspart protamine-insulin aspart  50 Units Subcutaneous Q breakfast  . DISCONTD: vancomycin  1,500 mg Intravenous Q24H   Continuous Infusions:    Detria Cummings, DO  [facility] Pager [phone]  If 7PM-7AM, please contact night-coverage [email or web address] Password [email or web address] 12/22/2011, 2:59 PM   LOS: 4 days

## 2011-12-22 NOTE — Progress Notes (Signed)
Gave 25u of 70/30 per MD d/t lower BS around 6 to 7am. Will continue to monitor

## 2011-12-22 NOTE — Progress Notes (Signed)
Hypoglycemic Event  CBG: 45  Treatment: 15 GM carbohydrate snack  Symptoms: None  Follow-up CBG: Time:0650 CBG Result:75  Possible Reasons for Event: Unknown  Comments/MD notified:    Bartholomew Boards  Remember to initiate Hypoglycemia Order Set & complete

## 2011-12-22 NOTE — Progress Notes (Signed)
ANTICOAGULATION CONSULT NOTE - Follow-Up Consult  Pharmacy Consult for Warfarin Indication: atrial fibrillation  No Known Allergies  Patient Measurements: Height: 5\' 9"  (175.3 cm) Weight: 264 lb 15.9 oz (120.2 kg) (bed scale) IBW/kg (Calculated) : 70.7   Vital Signs: Temp: 98.2 F (36.8 C) (09/08 1022) Temp src: Oral (09/08 1022) BP: 121/69 mmHg (09/08 1022) Pulse Rate: 62  (09/08 1022)  Labs:  Basename 12/22/11 0600 12/21/11 0550 12/20/11 0640  HGB 11.1* 10.9* --  HCT 34.4* 33.4* 34.2*  PLT 283 194 213  APTT -- -- --  LABPROT 23.1* 20.5* 19.1*  INR 2.01* 1.72* 1.57*  HEPARINUNFRC -- -- --  CREATININE 2.16* 1.99* 1.92*  CKTOTAL -- -- --  CKMB -- -- --  TROPONINI -- -- --    Estimated Creatinine Clearance: 38.4 ml/min (by C-G formula based on Cr of 2.16).  Assessment: 76 y.o. M admitted to [facility] on 9/4 with an acute on chronic CHF exacerbation. Pt on coumadin PTA for afib. (5 mg daily EXCEPT for 2.5 mg on Mon/Fri). INR therapeutic today. Ok to d/c lovenox. Noted drug interaction with ketoconazole.See Black box Warning in sticky notes.   Goal of Therapy:  INR 2-3   Plan:  1. Coumadin 5 mg x 1 dose at 1800 today 2. Daily PT/INR 3. D/c lovenox 4. Will continue to monitor for any signs/symptoms of bleeding and will follow up with PT/INR in the a.m.   Verlene Mayer, PharmD, BCPS Pager [phone] 12/22/2011 12:29 PM

## 2011-12-22 NOTE — Progress Notes (Signed)
1g cef. antid was started at 1533, Pharm d/c at 1540. MD called Pharm, Pharm stated to just continue infusing

## 2011-12-23 LAB — BASIC METABOLIC PANEL
Chloride: 94 mEq/L — ABNORMAL LOW (ref 96–112)
Creatinine, Ser: 1.98 mg/dL — ABNORMAL HIGH (ref 0.50–1.35)
GFR calc Af Amer: 37 mL/min — ABNORMAL LOW (ref >90)
Potassium: 4.1 mEq/L (ref 3.5–5.1)

## 2011-12-23 LAB — CBC WITH DIFFERENTIAL/PLATELET
Basophils Absolute: 0 10*3/uL (ref 0.0–0.1)
HCT: 31.7 % — ABNORMAL LOW (ref 39.0–52.0)
Lymphocytes Relative: 14 % (ref 12–46)
Monocytes Absolute: 0.7 10*3/uL (ref 0.1–1.0)
Neutro Abs: 9.3 10*3/uL — ABNORMAL HIGH (ref 1.7–7.7)
RDW: 15 % (ref 11.5–15.5)
WBC: 11.8 10*3/uL — ABNORMAL HIGH (ref 4.0–10.5)

## 2011-12-23 LAB — PROTIME-INR
INR: 2.44 — ABNORMAL HIGH (ref 0.00–1.49)
Prothrombin Time: 26.9 seconds — ABNORMAL HIGH (ref 11.6–15.2)

## 2011-12-23 LAB — GLUCOSE, CAPILLARY
Glucose-Capillary: 96 mg/dL (ref 70–99)
Glucose-Capillary: 164 mg/dL — ABNORMAL HIGH (ref 70–99)

## 2011-12-23 MED ORDER — WARFARIN SODIUM 2.5 MG PO TABS
2.5000 mg | ORAL_TABLET | Freq: Once | ORAL | Status: DC
  Filled 2011-12-23 (×2): qty 1

## 2011-12-23 MED ORDER — LACTULOSE 10 GM/15ML PO SOLN
20.0000 g | Freq: Four times a day (QID) | ORAL | Status: DC
  Administered 2011-12-23 – 2011-12-24 (×2): 20 g via ORAL
  Filled 2011-12-23 (×9): qty 30

## 2011-12-23 MED ORDER — FUROSEMIDE 10 MG/ML IJ SOLN
80.0000 mg | Freq: Two times a day (BID) | INTRAMUSCULAR | Status: DC
  Administered 2011-12-23 – 2011-12-25 (×4): 80 mg via INTRAVENOUS
  Filled 2011-12-23: qty 8
  Filled 2011-12-23: qty 4
  Filled 2011-12-23 (×3): qty 8
  Filled 2011-12-23: qty 4

## 2011-12-23 NOTE — Progress Notes (Signed)
[facility] PROGRESS NOTE  Vincent [name] ZOX:[id number] DOB: 09-06-1935 DOA: 12/18/2011 PCP: [name], MD  Assessment/Plan:  Acute on chronic CHF exacerbation-appears to be diastolic at this point  -Remains clinically fluid overloaded, increase furosemide to 80 mg IV every 12 -Hold on ACE inhibitor at this time due to his renal insufficiency  -continue carvedilol to 12.5 mg twice a day  -neg 3983 cc for the admission  Cardiorenal syndrome  -Monitor creatinine closely  CKD stage III  -Baseline creatinine 1.7-1.9  Paroxysmal Atrial fibrillation with rapid ventricular response  -Rate controlled with carvedilol  -Echocardiogram shows EF 45-50%, TSH 2.090  - continue warfarin per pharmacy dosing  Diabetes mellitus type 2, insulin-dependent  -Hemoglobin A1c 8.8, LDL 70  -Patient had hypoglycemia this morning September 8  -Decrease 70/30 insulin morning dose and evening dose by one half -Monitor CBG, gradually increase insulin as needed  Bacteremia-coagulase negative Staphylococcus  -This is likely the source of the patient's CHF decompensation  -Patient denies any prosthetic devices or plates or screws  -Continue cefazolin IV -Organism is sensitive to oxacillin  Medical noncompliance  -After conversation with the patient's daughter, it appears that the patient has difficulty with following up his outpatient appointments as well as taking his medications properly  Deconditioning  -PT evaluation, plan for skilled nursing facility  -Updated daughter today on medical condition. Constipation  -Continue Senokot -Add lactulose 20 g every 6, hold for diarrhea  Antibiotics:  Vancomycin September 5>>> September8  Cefazolin September 8>>> Code Status: Full  Family Communication: Daughter Annabelle Harman) [phone]  Disposition Plan: SNF when medically stable    Procedures/Studies: Dg Chest Port 1 View  12/21/2011  *RADIOLOGY REPORT*  Clinical Data: 76 year old male with congestive  heart failure. Shortness of breath.  PORTABLE CHEST - 1 VIEW  Comparison: 12/18/2011 and earlier.  Findings: AP portable semi upright view 1804 hours.  Stable lung volumes.  Stable cardiac size and mediastinal contours.  No pneumothorax.  No large effusion.  Retrocardiac streaky opacity compatible with atelectasis.  No overt pulmonary edema.  Chronic right lateral rib fractures.  IMPRESSION: Retrocardiac atelectasis, otherwise no acute cardiopulmonary abnormality.   Original Report Authenticated By: Vincent [name], M.D.    Dg Chest Port 1 View  12/18/2011  *RADIOLOGY REPORT*  Clinical Data: Atrial fibrillation.  High blood pressure. Diabetes. Shortness of breath.  PORTABLE CHEST - 1 VIEW  Comparison: 03/02/2011.  Findings: Remote right rib fractures.  Granuloma right upper lobe unchanged since 2008.  Cardiomegaly.  Pulmonary vascular prominence most notable centrally.  No segmental consolidation or gross pneumothorax.  The patient would eventually benefit from follow-up two-view chest with cardiac leads removed.  IMPRESSION: Cardiomegaly.  Pulmonary vascular prominence most notable centrally.  Calcified slightly tortuous aorta.  Stable granuloma right upper lobe.   Original Report Authenticated By: Vincent [name], M.D.       Subjective: Patient feeling better than yesterday. He felt stronger. States that breathing is better. Denies any headache, dizziness, chest pain, nausea, vomiting, diarrhea. Still having dyspnea on exertion but improving.  Objective: Filed Vitals:   12/22/11 2200 12/23/11 0500 12/23/11 0600 12/23/11 1030  BP: 96/69  102/68 124/68  Pulse: 118  118 92  Temp: 98.2 F (36.8 C)  97.6 F (36.4 C) 97.9 F (36.6 C)  TempSrc: Oral  Oral Oral  Resp: 20  20 18   Height:      Weight:  120.9 kg (266 lb 8.6 oz)    SpO2: 100%  100% 99%  Intake/Output Summary (Last 24 hours) at 12/23/11 1122 Last data filed at 12/23/11 1053  Gross per 24 hour  Intake    706 ml  Output   2425 ml    Net  -1719 ml   Weight change: 0.7 kg (1 lb 8.7 oz) Exam:   General:  Pt is alert, follows commands appropriately, not in acute distress  HEENT: No icterus, No thrush, , [place]/AT  Cardiovascular: IRRR, S1/S2, no rubs, no gallops  Respiratory: Bibasilar crackles. No wheezes or rhonchi. Good air movement.  Abdomen: Soft, non tender, non distended, bowel sounds present, no guarding  Extremities: 2+ lower extremity edema, No lymphangitis, No petechiae, No rashes, no synovitis  Data Reviewed: Basic Metabolic Panel:  Lab 12/23/11 1610 12/22/11 0600 12/21/11 0550 12/20/11 0640 12/19/11 0655  NA 134* 138 140 137 133*  K 4.1 3.6 3.7 3.3* 3.3*  CL 94* 98 100 97 94*  CO2 29 29 32 29 27  GLUCOSE 164* 52* 91 144* 300*  BUN 37* 30* 27* 20 20  CREATININE 1.98* 2.16* 1.99* 1.92* 1.78*  CALCIUM 9.1 9.5 9.4 9.0 8.9  MG -- 2.1 -- 1.6 --  PHOS -- -- -- -- --   Liver Function Tests:  Lab 12/18/11 0955  AST 19  ALT 16  ALKPHOS 82  BILITOT 1.4*  PROT 8.4*  ALBUMIN 3.3*   No results found for this basename: LIPASE:5,AMYLASE:5 in the last 168 hours No results found for this basename: AMMONIA:5 in the last 168 hours CBC:  Lab 12/23/11 0606 12/22/11 0600 12/21/11 0550 12/20/11 0640 12/19/11 0655 12/18/11 0955  WBC 11.8* 16.6* 11.2* 13.6* 12.3* --  NEUTROABS 9.3* -- -- -- 9.3* 10.1*  HGB 10.3* 11.1* 10.9* 11.2* 9.6* --  HCT 31.7* 34.4* 33.4* 34.2* 29.8* --  MCV 96.4 96.6 97.1 96.3 97.7 --  PLT 216 283 194 213 190 --   Cardiac Enzymes:  Lab 12/18/11 2211 12/18/11 1655  CKTOTAL -- --  CKMB -- --  CKMBINDEX -- --  TROPONINI <0.30 <0.30   BNP: No components found with this basename: POCBNP:5 CBG:  Lab 12/23/11 0614 12/22/11 2118 12/22/11 1612 12/22/11 1108 12/22/11 0831  GLUCAP 153* 218* 225* 164* 157*    Recent Results (from the past 240 hour(s))  URINE CULTURE     Status: Normal   Collection Time   12/18/11 10:38 AM      Component Value Range Status Comment   Specimen  Description URINE, CLEAN CATCH   Final    Special Requests NONE   Final    Culture  Setup Time 12/18/2011 11:50   Final    Colony Count 3,000 COLONIES/ML   Final    Culture INSIGNIFICANT GROWTH   Final    Report Status 12/19/2011 FINAL   Final   CULTURE, BLOOD (ROUTINE X 2)     Status: Normal   Collection Time   12/18/11  4:35 PM      Component Value Range Status Comment   Specimen Description BLOOD LEFT HAND   Final    Special Requests BOTTLES DRAWN AEROBIC ONLY 7CC   Final    Culture  Setup Time 12/18/2011 22:56   Final    Culture     Final    Value: STAPHYLOCOCCUS SPECIES (COAGULASE NEGATIVE)     Note: RIFAMPIN AND GENTAMICIN SHOULD NOT BE USED AS SINGLE DRUGS FOR TREATMENT OF STAPH INFECTIONS.     5 Note: Gram Stain Report Called to,Read Back By and Verified With: NURSE RASHIDA HANEY  AT 6:13PM 9 2013 BY THOMI   Report Status 12/22/2011 FINAL   Final    Organism ID, Bacteria STAPHYLOCOCCUS SPECIES (COAGULASE NEGATIVE)   Final   CULTURE, BLOOD (ROUTINE X 2)     Status: Normal   Collection Time   12/18/11  4:45 PM      Component Value Range Status Comment   Specimen Description BLOOD LEFT HAND   Final    Special Requests BOTTLES DRAWN AEROBIC ONLY 5CC   Final    Culture  Setup Time 12/18/2011 22:57   Final    Culture     Final    Value: STAPHYLOCOCCUS SPECIES (COAGULASE NEGATIVE)     Note: SUSCEPTIBILITIES PERFORMED ON PREVIOUS CULTURE WITHIN THE LAST 5 DAYS.     Note: Gram Stain Report Called to,Read Back By and Verified With: COURTNEY GENTRY 12/19/11 1410 BY SMITJ   Report Status 12/22/2011 FINAL   Final   CULTURE, BLOOD (ROUTINE X 2)     Status: Normal (Preliminary result)   Collection Time   12/21/11  5:50 AM      Component Value Range Status Comment   Specimen Description BLOOD RIGHT ARM   Final    Special Requests BOTTLES DRAWN AEROBIC AND ANAEROBIC 10CC EACH   Final    Culture  Setup Time 12/21/2011 10:12   Final    Culture     Final    Value:        BLOOD CULTURE RECEIVED NO  GROWTH TO DATE CULTURE WILL BE HELD FOR 5 DAYS BEFORE ISSUING A FINAL NEGATIVE REPORT   Report Status PENDING   Incomplete   CULTURE, BLOOD (ROUTINE X 2)     Status: Normal (Preliminary result)   Collection Time   12/21/11  6:05 AM      Component Value Range Status Comment   Specimen Description BLOOD LEFT HAND   Final    Special Requests BOTTLES DRAWN AEROBIC AND ANAEROBIC 10CC EACH   Final    Culture  Setup Time 12/21/2011 10:12   Final    Culture     Final    Value:        BLOOD CULTURE RECEIVED NO GROWTH TO DATE CULTURE WILL BE HELD FOR 5 DAYS BEFORE ISSUING A FINAL NEGATIVE REPORT   Report Status PENDING   Incomplete      Scheduled Meds:   . allopurinol  150 mg Oral Daily  . carvedilol  12.5 mg Oral BID WC  .  ceFAZolin (ANCEF) IV  2 g Intravenous Q8H  . furosemide  120 mg Intravenous Once  . furosemide  80 mg Intravenous Q12H  . hydrocortisone  20 mg Oral BID  . insulin aspart  0-20 Units Subcutaneous TID WC  . insulin aspart  0-5 Units Subcutaneous QHS  . insulin aspart protamine-insulin aspart  20 Units Subcutaneous Q supper  . insulin aspart protamine-insulin aspart  25 Units Subcutaneous Q breakfast  . ketoconazole  400 mg Oral TID  . lactulose  20 g Oral Q6H  . neomycin-bacitracin-polymyxin   Topical Daily  . senna-docusate  1 tablet Oral BID  . sodium chloride  3 mL Intravenous Q12H  . warfarin  2.5 mg Oral ONCE-1800  . warfarin  5 mg Oral ONCE-1800  . Warfarin - Pharmacist Dosing Inpatient   Does not apply q1800  . DISCONTD:  ceFAZolin (ANCEF) IV  1 g Intravenous Q8H  . DISCONTD: enoxaparin  40 mg Subcutaneous Q24H  . DISCONTD: furosemide  80 mg Intravenous  Once  . DISCONTD: furosemide  40 mg Oral BID  . DISCONTD: insulin aspart protamine-insulin aspart  40 Units Subcutaneous q1800  . DISCONTD: insulin aspart protamine-insulin aspart  50 Units Subcutaneous Q breakfast  . DISCONTD: vancomycin  1,500 mg Intravenous Q24H   Continuous Infusions:    Anyla Israelson,  DO  [facility] Pager [phone]  If 7PM-7AM, please contact night-coverage [email or web address] Password [email or web address] 12/23/2011, 11:22 AM   LOS: 5 days

## 2011-12-23 NOTE — Progress Notes (Signed)
ANTICOAGULATION CONSULT NOTE - Follow Up Consult  Pharmacy Consult for Coumadin Indication: atrial fibrillation  No Known Allergies  Patient Measurements: Height: 5\' 9"  (175.3 cm) Weight: 266 lb 8.6 oz (120.9 kg) IBW/kg (Calculated) : 70.7   Vital Signs: Temp: 97.6 F (36.4 C) (09/09 0600) Temp src: Oral (09/09 0600) BP: 102/68 mmHg (09/09 0600) Pulse Rate: 118  (09/09 0600)  Labs:  Basename 12/23/11 0606 12/22/11 0600 12/21/11 0550  HGB 10.3* 11.1* --  HCT 31.7* 34.4* 33.4*  PLT 216 283 194  APTT -- -- --  LABPROT 26.9* 23.1* 20.5*  INR 2.44* 2.01* 1.72*  HEPARINUNFRC -- -- --  CREATININE 1.98* 2.16* 1.99*  CKTOTAL -- -- --  CKMB -- -- --  TROPONINI -- -- --    Estimated Creatinine Clearance: 42 ml/min (by C-G formula based on Cr of 1.98).   Assessment: 76yom on Coumadin for Afib. INR (2.44) is therapeutic with restart of home Coumadin regimen (5mg  daily except 2.5mg  on Mon/Fri). Noted ketoconazole drug interaction (and increased dosage to MD prescribed regimen) which can increase the INR - monitor closely. Will continue PTA Coumadin regimen and follow-up AM INR. - H/H and Plts trended down - No significant bleeding reported  Goal of Therapy:  INR 2-3   Plan:  1. Continue Coumadin home regimen - 2.5mg  due today 2. Follow-up AM INR and s/sx of bleeding  [name] [phone] 12/23/2011,9:40 AM

## 2011-12-23 NOTE — Progress Notes (Signed)
Physical Therapy Treatment Patient Details Name: Vincent [name] MRN: [id number] DOB: October 13, 1935 Today's Date: 12/23/2011 Time: 9563-8756 PT Time Calculation (min): 21 min  PT Assessment / Plan / Recommendation Comments on Treatment Session  Pt progressed with mobility today in that he ambulated 5' with RW but continues to be very limited in activity tolerance and needs much verbal motivation, continue to recommend SNF for rehab at d/c.  PT will continue to follow.    Follow Up Recommendations  Skilled nursing facility;Supervision/Assistance - 24 hour    Barriers to Discharge        Equipment Recommendations  Rolling walker with 5" wheels;Defer to next venue    Recommendations for Other Services    Frequency Min 3X/week   Plan Discharge plan remains appropriate;Frequency remains appropriate    Precautions / Restrictions Precautions Precautions: Fall Restrictions Weight Bearing Restrictions: No   Pertinent Vitals/Pain No c/o pain O2 sats 98% after ambulation on RA HR 107 bpm before ambulation,113 bpm after ambulation    Mobility  Bed Mobility Bed Mobility: Not assessed (pt in chair) Transfers Transfers: Sit to Stand;Stand to Sit Sit to Stand: 1: +2 Total assist;From chair/3-in-1;With upper extremity assist Sit to Stand: Patient Percentage: 40% Stand to Sit: 1: +2 Total assist;To chair/3-in-1;With upper extremity assist Stand to Sit: Patient Percentage: 60% Details for Transfer Assistance: Pt percentage 40% for first stand, 50% for second stand with vc's to keep wt moving fwd.  Pt limited with transfer ability due to knee weakness R>L.  Pt sat with one controlled sit and one uncontrolled descent. Ambulation/Gait Ambulation/Gait Assistance: 1: +2 Total assist Ambulation/Gait: Patient Percentage: 80% Ambulation Distance (Feet): 5 Feet Assistive device: Rolling walker Ambulation/Gait Assistance Details: pt cued to step with right foot first due to weakness of that knee,  encouragement to walk to closet door from chair Gait Pattern: Step-to pattern;Decreased stride length;Wide base of support;Trunk flexed Gait velocity: decreased Stairs: No Wheelchair Mobility Wheelchair Mobility: No    Exercises     PT Diagnosis:    PT Problem List:   PT Treatment Interventions:     PT Goals Acute Rehab PT Goals PT Goal Formulation: With patient Time For Goal Achievement: 01/03/12 Potential to Achieve Goals: Fair Pt will go Supine/Side to Sit: with modified independence Pt will go Sit to Supine/Side: with modified independence Pt will go Sit to Stand: with supervision PT Goal: Sit to Stand - Progress: Progressing toward goal Pt will go Stand to Sit: with supervision PT Goal: Stand to Sit - Progress: Progressing toward goal Pt will Transfer Bed to Chair/Chair to Bed: with min assist PT Transfer Goal: Bed to Chair/Chair to Bed - Progress: Progressing toward goal Pt will Ambulate: 51 - 150 feet;with min assist;with least restrictive assistive device PT Goal: Ambulate - Progress: Progressing toward goal  Visit Information  Last PT Received On: 12/23/11 Assistance Needed: +2    Subjective Data  Subjective: Pt reports that he is doing better but his legs are still weak Patient Stated Goal: agreeable to SNF before home   Cognition  Overall Cognitive Status: Appears within functional limits for tasks assessed/performed Arousal/Alertness: Awake/alert Orientation Level: Appears intact for tasks assessed Behavior During Session: [facility] for tasks performed Cognition - Other Comments: pt somewhat self limiting, needed max encouragement to advance ambulation distance    Balance  Balance Balance Assessed: Yes Static Standing Balance Static Standing - Balance Support: Bilateral upper extremity supported;During functional activity Static Standing - Level of Assistance: 4: Min assist Static Standing -  Comment/# of Minutes: stood at [facility] for 3 mins before needing to sit    End of Session PT - End of Session Equipment Utilized During Treatment: Gait belt Activity Tolerance: Patient limited by fatigue Patient left: in chair;with call bell/phone within reach;with family/visitor present Nurse Communication: Mobility status   GP   Lyanne Co, PT  Acute Rehab Services  [phone]   Lyanne Co 12/23/2011, 2:18 PM

## 2011-12-24 LAB — GLUCOSE, CAPILLARY
Glucose-Capillary: 174 mg/dL — ABNORMAL HIGH (ref 70–99)
Glucose-Capillary: 124 mg/dL — ABNORMAL HIGH (ref 70–99)
Glucose-Capillary: 86 mg/dL (ref 70–99)
Glucose-Capillary: 84 mg/dL (ref 70–99)
Glucose-Capillary: 125 mg/dL — ABNORMAL HIGH (ref 70–99)

## 2011-12-24 LAB — BASIC METABOLIC PANEL
BUN: 41 mg/dL — ABNORMAL HIGH (ref 6–23)
CO2: 29 mEq/L (ref 19–32)
Calcium: 9 mg/dL (ref 8.4–10.5)
Chloride: 96 mEq/L (ref 96–112)
Creatinine, Ser: 2.09 mg/dL — ABNORMAL HIGH (ref 0.50–1.35)
GFR calc Af Amer: 34 mL/min — ABNORMAL LOW (ref >90)
GFR calc non Af Amer: 30 mL/min — ABNORMAL LOW (ref >90)
Glucose, Bld: 118 mg/dL — ABNORMAL HIGH (ref 70–99)
Potassium: 3.9 mEq/L (ref 3.5–5.1)
Sodium: 135 mEq/L (ref 135–145)

## 2011-12-24 LAB — CBC
MCH: 31.6 pg (ref 26.0–34.0)
MCHC: 33.1 g/dL (ref 30.0–36.0)
MCV: 95.5 fL (ref 78.0–100.0)
Platelets: 241 10*3/uL (ref 150–400)
RDW: 15.2 % (ref 11.5–15.5)

## 2011-12-24 LAB — URINE CULTURE

## 2011-12-24 MED ORDER — INSULIN ASPART PROT & ASPART (70-30 MIX) 100 UNIT/ML ~~LOC~~ SUSP
10.0000 [IU] | Freq: Every day | SUBCUTANEOUS | Status: DC
  Administered 2011-12-24 – 2011-12-27 (×4): 10 [IU] via SUBCUTANEOUS

## 2011-12-24 MED ORDER — INSULIN ASPART PROT & ASPART (70-30 MIX) 100 UNIT/ML ~~LOC~~ SUSP
10.0000 [IU] | Freq: Every day | SUBCUTANEOUS | Status: DC
  Administered 2011-12-25 – 2011-12-26 (×2): 10 [IU] via SUBCUTANEOUS

## 2011-12-24 MED ORDER — WARFARIN SODIUM 2.5 MG PO TABS
2.5000 mg | ORAL_TABLET | Freq: Once | ORAL | Status: AC
  Administered 2011-12-24: 2.5 mg via ORAL
  Filled 2011-12-24: qty 1

## 2011-12-24 NOTE — Progress Notes (Signed)
[facility] PROGRESS NOTE  MIDAS DAUGHETY WUJ:[id number] DOB: 16-Aug-1935 DOA: 12/18/2011 PCP: [name], MD  Assessment/Plan:  Acute on chronic systolic CHF exacerbation- -Remains clinically fluid overloaded -continue furosemide to 80 mg IV every 12 (increased on 9/8) -Hold on ACE inhibitor at this time due to his renal insufficiency  -continue carvedilol to 12.5 mg twice a day  -neg 5101 cc for the admission  Cardiorenal syndrome  -Monitor creatinine closely  -slowly climbing Bacteremia-coagulase negative Staphylococcus  -This is likely the source of the patient's CHF decompensation  -Patient denies any prosthetic devices or plates or screws -likely source are skin ulcerations on his legs -Continue cefazolin IV---may need to decrease dosing to 1 g IV every 8 hours if creatinine clearance less than 35 -Today is day 6 of antibiotics total -Plan is for 14 days of IV antibiotics -Repeat blood cultures negative, echocardiogram negative for thrombi CKD stage III  -Baseline creatinine 1.7-1.9  Paroxysmal Atrial fibrillation with rapid ventricular response  -Rate controlled with carvedilol  -Echocardiogram shows EF 45-50%, TSH 2.090  - continue warfarin per pharmacy dosing  Diabetes mellitus type 2, insulin-dependent  -Hemoglobin A1c 8.8, LDL 70  -Patient had hypoglycemia this morning September 8  -Decrease 70/30 insulin morning dose and evening dose, patient has had decreased by mouth intake -Monitor CBG, gradually increase insulin as needed   Medical noncompliance  -After conversation with the patient's daughter, it appears that the patient has difficulty with following up his outpatient appointments as well as taking his medications properly  Deconditioning  -PT evaluation, plan for skilled nursing facility  -Updated daughter today on medical condition.  Constipation  -Continue Senokot  -Improved Antibiotics:  Vancomycin September 5>>> September8  Cefazolin  September 8>>>   Code Status: Full  Family Communication: Daughter Annabelle Harman) [phone]  Disposition Plan: SNF when medically stable    Procedures/Studies: Dg Chest Port 1 View  12/21/2011  *RADIOLOGY REPORT*  Clinical Data: 76 year old male with congestive heart failure. Shortness of breath.  PORTABLE CHEST - 1 VIEW  Comparison: 12/18/2011 and earlier.  Findings: AP portable semi upright view 1804 hours.  Stable lung volumes.  Stable cardiac size and mediastinal contours.  No pneumothorax.  No large effusion.  Retrocardiac streaky opacity compatible with atelectasis.  No overt pulmonary edema.  Chronic right lateral rib fractures.  IMPRESSION: Retrocardiac atelectasis, otherwise no acute cardiopulmonary abnormality.   Original Report Authenticated By: Harley Hallmark, M.D.    Dg Chest Port 1 View  12/18/2011  *RADIOLOGY REPORT*  Clinical Data: Atrial fibrillation.  High blood pressure. Diabetes. Shortness of breath.  PORTABLE CHEST - 1 VIEW  Comparison: 03/02/2011.  Findings: Remote right rib fractures.  Granuloma right upper lobe unchanged since 2008.  Cardiomegaly.  Pulmonary vascular prominence most notable centrally.  No segmental consolidation or gross pneumothorax.  The patient would eventually benefit from follow-up two-view chest with cardiac leads removed.  IMPRESSION: Cardiomegaly.  Pulmonary vascular prominence most notable centrally.  Calcified slightly tortuous aorta.  Stable granuloma right upper lobe.   Original Report Authenticated By: Fuller Canada, M.D.        Subjective: Patient is feeling better. He denies any chest pain, shortness breath, nausea, vomiting, diarrhea, abdominal pain, rashes, dizziness.  Objective: Filed Vitals:   12/24/11 0416 12/24/11 0600 12/24/11 0957 12/24/11 1313  BP: 125/60 105/76 102/50 111/65  Pulse: 116 122 116 77  Temp: 98.4 F (36.9 C)   97.6 F (36.4 C)  TempSrc: Oral   Oral  Resp: 20  18  Height:      Weight: 118.9 kg (262 lb 2 oz)       SpO2: 100%   100%    Intake/Output Summary (Last 24 hours) at 12/24/11 1737 Last data filed at 12/24/11 1257  Gross per 24 hour  Intake    926 ml  Output   1951 ml  Net  -1025 ml   Weight change: -2 kg (-4 lb 6.6 oz) Exam:   General:  Pt is alert, follows commands appropriately, not in acute distress  HEENT: No icterus, No thrush, No neck mass, [place]/AT  Cardiovascular: Regular rate and rhythm, S1/S2, no rubs, no gallops  Respiratory: Basilar crackles left greater than right, no wheezing, no crackles, no rhonchi  Abdomen: Soft, non tender, non distended, bowel sounds present, no guarding  Extremities: 2+ edema, No lymphangitis, No petechiae, No rashes, no synovitis  Data Reviewed: Basic Metabolic Panel:  Lab 12/24/11 2130 12/23/11 0606 12/22/11 0600 12/21/11 0550 12/20/11 0640  NA 135 134* 138 140 137  K 3.9 4.1 3.6 3.7 3.3*  CL 96 94* 98 100 97  CO2 29 29 29  32 29  GLUCOSE 118* 164* 52* 91 144*  BUN 41* 37* 30* 27* 20  CREATININE 2.09* 1.98* 2.16* 1.99* 1.92*  CALCIUM 9.0 9.1 9.5 9.4 9.0  MG -- -- 2.1 -- 1.6  PHOS -- -- -- -- --   Liver Function Tests:  Lab 12/18/11 0955  AST 19  ALT 16  ALKPHOS 82  BILITOT 1.4*  PROT 8.4*  ALBUMIN 3.3*   No results found for this basename: LIPASE:5,AMYLASE:5 in the last 168 hours No results found for this basename: AMMONIA:5 in the last 168 hours CBC:  Lab 12/24/11 0600 12/23/11 0606 12/22/11 0600 12/21/11 0550 12/20/11 0640 12/19/11 0655 12/18/11 0955  WBC 10.7* 11.8* 16.6* 11.2* 13.6* -- --  NEUTROABS -- 9.3* -- -- -- 9.3* 10.1*  HGB 9.9* 10.3* 11.1* 10.9* 11.2* -- --  HCT 29.9* 31.7* 34.4* 33.4* 34.2* -- --  MCV 95.5 96.4 96.6 97.1 96.3 -- --  PLT 241 216 283 194 213 -- --   Cardiac Enzymes:  Lab 12/18/11 2211 12/18/11 1655  CKTOTAL -- --  CKMB -- --  CKMBINDEX -- --  TROPONINI <0.30 <0.30   BNP: No components found with this basename: POCBNP:5 CBG:  Lab 12/24/11 1609 12/24/11 1104 12/24/11 0552 12/23/11  2119 12/23/11 1615  GLUCAP 84 86 124* 174* 164*    Recent Results (from the past 240 hour(s))  URINE CULTURE     Status: Normal   Collection Time   12/18/11 10:38 AM      Component Value Range Status Comment   Specimen Description URINE, CLEAN CATCH   Final    Special Requests NONE   Final    Culture  Setup Time 12/18/2011 11:50   Final    Colony Count 3,000 COLONIES/ML   Final    Culture INSIGNIFICANT GROWTH   Final    Report Status 12/19/2011 FINAL   Final   CULTURE, BLOOD (ROUTINE X 2)     Status: Normal   Collection Time   12/18/11  4:35 PM      Component Value Range Status Comment   Specimen Description BLOOD LEFT HAND   Final    Special Requests BOTTLES DRAWN AEROBIC ONLY [facility]   Final    Culture  Setup Time 12/18/2011 22:56   Final    Culture     Final    Value: STAPHYLOCOCCUS SPECIES (  COAGULASE NEGATIVE)     Note: RIFAMPIN AND GENTAMICIN SHOULD NOT BE USED AS SINGLE DRUGS FOR TREATMENT OF STAPH INFECTIONS.     5 Note: Gram Stain Report Called to,Read Back By and Verified With: NURSE RASHIDA HANEY AT 6:13PM 9 2013 BY THOMI   Report Status 12/22/2011 FINAL   Final    Organism ID, Bacteria STAPHYLOCOCCUS SPECIES (COAGULASE NEGATIVE)   Final   CULTURE, BLOOD (ROUTINE X 2)     Status: Normal   Collection Time   12/18/11  4:45 PM      Component Value Range Status Comment   Specimen Description BLOOD LEFT HAND   Final    Special Requests BOTTLES DRAWN AEROBIC ONLY 5CC   Final    Culture  Setup Time 12/18/2011 22:57   Final    Culture     Final    Value: STAPHYLOCOCCUS SPECIES (COAGULASE NEGATIVE)     Note: SUSCEPTIBILITIES PERFORMED ON PREVIOUS CULTURE WITHIN THE LAST 5 DAYS.     Note: Gram Stain Report Called to,Read Back By and Verified With: COURTNEY GENTRY 12/19/11 1410 BY SMITJ   Report Status 12/22/2011 FINAL   Final   CULTURE, BLOOD (ROUTINE X 2)     Status: Normal (Preliminary result)   Collection Time   12/21/11  5:50 AM      Component Value Range Status Comment   Specimen  Description BLOOD RIGHT ARM   Final    Special Requests BOTTLES DRAWN AEROBIC AND ANAEROBIC 10CC EACH   Final    Culture  Setup Time 12/21/2011 10:12   Final    Culture     Final    Value:        BLOOD CULTURE RECEIVED NO GROWTH TO DATE CULTURE WILL BE HELD FOR 5 DAYS BEFORE ISSUING A FINAL NEGATIVE REPORT   Report Status PENDING   Incomplete   CULTURE, BLOOD (ROUTINE X 2)     Status: Normal (Preliminary result)   Collection Time   12/21/11  6:05 AM      Component Value Range Status Comment   Specimen Description BLOOD LEFT HAND   Final    Special Requests BOTTLES DRAWN AEROBIC AND ANAEROBIC 10CC EACH   Final    Culture  Setup Time 12/21/2011 10:12   Final    Culture     Final    Value:        BLOOD CULTURE RECEIVED NO GROWTH TO DATE CULTURE WILL BE HELD FOR 5 DAYS BEFORE ISSUING A FINAL NEGATIVE REPORT   Report Status PENDING   Incomplete   URINE CULTURE     Status: Normal   Collection Time   12/22/11  3:38 PM      Component Value Range Status Comment   Specimen Description URINE, CATHETERIZED   Final    Special Requests NONE   Final    Culture  Setup Time 12/22/2011 23:27   Final    Colony Count NO GROWTH   Final    Culture NO GROWTH   Final    Report Status 12/24/2011 FINAL   Final      Scheduled Meds:    . allopurinol  150 mg Oral Daily  . carvedilol  12.5 mg Oral BID WC  .  ceFAZolin (ANCEF) IV  2 g Intravenous Q8H  . furosemide  80 mg Intravenous BID  . hydrocortisone  20 mg Oral BID  . insulin aspart  0-20 Units Subcutaneous TID WC  . insulin aspart  0-5 Units Subcutaneous  QHS  . insulin aspart protamine-insulin aspart  10 Units Subcutaneous Q breakfast  . insulin aspart protamine-insulin aspart  10 Units Subcutaneous Q supper  . ketoconazole  400 mg Oral TID  . neomycin-bacitracin-polymyxin   Topical Daily  . senna-docusate  1 tablet Oral BID  . sodium chloride  3 mL Intravenous Q12H  . warfarin  2.5 mg Oral ONCE-1800  . Warfarin - Pharmacist Dosing Inpatient   Does  not apply q1800  . DISCONTD: insulin aspart protamine-insulin aspart  20 Units Subcutaneous Q supper  . DISCONTD: insulin aspart protamine-insulin aspart  25 Units Subcutaneous Q breakfast  . DISCONTD: lactulose  20 g Oral Q6H  . DISCONTD: warfarin  2.5 mg Oral ONCE-1800   Continuous Infusions:    Sayla Golonka, DO  [facility] Pager [phone]  If 7PM-7AM, please contact night-coverage [email or web address] Password [email or web address] 12/24/2011, 5:37 PM   LOS: 6 days

## 2011-12-24 NOTE — Progress Notes (Signed)
ANTICOAGULATION CONSULT NOTE - Follow Up Consult  Pharmacy Consult for Coumadin Indication: atrial fibrillation  No Known Allergies  Patient Measurements: Height: 5\' 9"  (175.3 cm) Weight: 262 lb 2 oz (118.9 kg) (bed scale) IBW/kg (Calculated) : 70.7  Heparin Dosing Weight:   Vital Signs: Temp: 98.4 F (36.9 C) (09/10 0416) Temp src: Oral (09/10 0416) BP: 105/76 mmHg (09/10 0600) Pulse Rate: 122  (09/10 0600)  Labs:  Basename 12/24/11 0600 12/23/11 0606 12/22/11 0600  HGB 9.9* 10.3* --  HCT 29.9* 31.7* 34.4*  PLT 241 216 283  APTT -- -- --  LABPROT 30.7* 26.9* 23.1*  INR 2.89* 2.44* 2.01*  HEPARINUNFRC -- -- --  CREATININE 2.09* 1.98* 2.16*  CKTOTAL -- -- --  CKMB -- -- --  TROPONINI -- -- --    Estimated Creatinine Clearance: 39.5 ml/min (by C-G formula based on Cr of 2.09).  Assessment: 76yom on Coumadin for Afib. INR (2.89) is therapeutic and continues to trend up. Please note, 9/9 Coumadin dose was not charted/given (found in patient drawer this AM). Noted ketoconazole drug interaction (and increased dosage to MD prescribed regimen) which can increase the INR - monitor closely. - No CBC this AM - No significant bleeding reported  Goal of Therapy:  INR 2-3   Plan:  1. Coumadin 2.5mg  po x 1 today 2. Follow-up AM INR  [name] [phone] 12/24/2011,8:37 AM

## 2011-12-24 NOTE — Progress Notes (Signed)
CSW is continuing to follow and will assist with all d/c needs.  Sabino Niemann, MSW, [employer] [phone]

## 2011-12-24 NOTE — Progress Notes (Signed)
Co-signed assignments, medications, I&O's, and vitals for Junius Argyle RN. Ernesta Amble, RN

## 2011-12-25 DIAGNOSIS — I4891 Unspecified atrial fibrillation: Secondary | ICD-10-CM

## 2011-12-25 DIAGNOSIS — R7881 Bacteremia: Secondary | ICD-10-CM

## 2011-12-25 DIAGNOSIS — E119 Type 2 diabetes mellitus without complications: Secondary | ICD-10-CM

## 2011-12-25 LAB — GLUCOSE, CAPILLARY
Glucose-Capillary: 197 mg/dL — ABNORMAL HIGH (ref 70–99)
Glucose-Capillary: 291 mg/dL — ABNORMAL HIGH (ref 70–99)

## 2011-12-25 LAB — PROTIME-INR
INR: 2.9 — ABNORMAL HIGH (ref 0.00–1.49)
Prothrombin Time: 30.8 seconds — ABNORMAL HIGH (ref 11.6–15.2)

## 2011-12-25 MED ORDER — DILTIAZEM HCL 30 MG PO TABS
30.0000 mg | ORAL_TABLET | Freq: Four times a day (QID) | ORAL | Status: DC
  Administered 2011-12-25 – 2011-12-26 (×2): 30 mg via ORAL
  Filled 2011-12-25 (×6): qty 1

## 2011-12-25 MED ORDER — WARFARIN SODIUM 2.5 MG PO TABS
2.5000 mg | ORAL_TABLET | Freq: Once | ORAL | Status: AC
  Administered 2011-12-25: 2.5 mg via ORAL
  Filled 2011-12-25: qty 1

## 2011-12-25 MED ORDER — FUROSEMIDE 80 MG PO TABS
80.0000 mg | ORAL_TABLET | Freq: Two times a day (BID) | ORAL | Status: DC
  Administered 2011-12-25 – 2011-12-26 (×2): 80 mg via ORAL
  Filled 2011-12-25 (×4): qty 1

## 2011-12-25 MED ORDER — CARVEDILOL 25 MG PO TABS
25.0000 mg | ORAL_TABLET | Freq: Two times a day (BID) | ORAL | Status: DC
  Administered 2011-12-25 – 2011-12-27 (×5): 25 mg via ORAL
  Filled 2011-12-25 (×6): qty 1

## 2011-12-25 NOTE — Progress Notes (Signed)
[facility] PROGRESS NOTE  JACHAI OKAZAKI ZOX:[id number] DOB: 06-01-1935 DOA: 12/18/2011 PCP: [name], MD  Assessment/Plan:  Acute on chronic systolic CHF exacerbation- -Remains clinically fluid overloaded -continue furosemide, change to PO to 80 mg every 12  -Hold on ACE inhibitor at this time due to his renal insufficiency  -continue carvedilol to 12.5 mg twice a day  -neg 4.1 cc for the admission   Cardiorenal syndrome  -Monitor creatinine closely  -slowly climbing, Bmet pending today  Bacteremia-coagulase negative Staphylococcus  -This is likely the source of the patient's CHF decompensation  -Patient denies any prosthetic devices or plates or screws -likely source are skin ulcerations on his legs -Continue cefazolin IV---may need to decrease dosing to 1 g IV every 8 hours if creatinine clearance less than 35 -Today is day 7 of antibiotics total -Plan is for 14 days of IV antibiotics -Repeat blood cultures(9/7) negative so far, echocardiogram negative for vegetations - request PICC tomorrow if repeat cultures remain negative  CKD stage III : fluctuating with diuresis -Baseline creatinine 1.7-1.9   Paroxysmal Atrial fibrillation with rapid ventricular response  -Rate up, increase carvedilol  -Echocardiogram shows EF 45-50%, TSH 2.090  - continue warfarin per pharmacy dosing   Diabetes mellitus type 2, insulin-dependent  -Hemoglobin A1c 8.8, LDL 70  -Patient had hypoglycemia this morning September 8  -Decrease 70/30 insulin morning dose and evening dose, patient has had decreased by mouth intake -Monitor CBG, gradually increase insulin as needed   Medical noncompliance  -Dr.Tat had conversation with the patient's daughter, it appears that the patient has difficulty with following up his outpatient appointments as well as taking his medications properly   Deconditioning  -PT evaluation, plan for skilled nursing facility   Constipation  -Continue Senokot    -Improved  Antibiotics:  Vancomycin September 5>>> September8  Cefazolin September 8>>>   Code Status: Full  Family Communication: Daughter Annabelle Harman) [phone]  Disposition Plan: SNF when medically stable    Procedures/Studies: Dg Chest Port 1 View  12/21/2011  *RADIOLOGY REPORT*  Clinical Data: 76 year old male with congestive heart failure. Shortness of breath.  PORTABLE CHEST - 1 VIEW  Comparison: 12/18/2011 and earlier.  Findings: AP portable semi upright view 1804 hours.  Stable lung volumes.  Stable cardiac size and mediastinal contours.  No pneumothorax.  No large effusion.  Retrocardiac streaky opacity compatible with atelectasis.  No overt pulmonary edema.  Chronic right lateral rib fractures.  IMPRESSION: Retrocardiac atelectasis, otherwise no acute cardiopulmonary abnormality.   Original Report Authenticated By: Harley Hallmark, M.D.    Dg Chest Port 1 View  12/18/2011  *RADIOLOGY REPORT*  Clinical Data: Atrial fibrillation.  High blood pressure. Diabetes. Shortness of breath.  PORTABLE CHEST - 1 VIEW  Comparison: 03/02/2011.  Findings: Remote right rib fractures.  Granuloma right upper lobe unchanged since 2008.  Cardiomegaly.  Pulmonary vascular prominence most notable centrally.  No segmental consolidation or gross pneumothorax.  The patient would eventually benefit from follow-up two-view chest with cardiac leads removed.  IMPRESSION: Cardiomegaly.  Pulmonary vascular prominence most notable centrally.  Calcified slightly tortuous aorta.  Stable granuloma right upper lobe.   Original Report Authenticated By: Fuller Canada, M.D.        Subjective: Patient is feeling better. He denies any chest pain, shortness breath, nausea, vomiting, diarrhea, abdominal pain, rashes, dizziness.  Objective: Filed Vitals:   12/24/11 1752 12/24/11 2159 12/25/11 0536 12/25/11 1316  BP: 123/67 134/85 130/77 113/59  Pulse: 102 100 121 122  Temp:  98.2 F (36.8 C) 98.1 F (36.7 C) 98.4 F (36.9  C)  TempSrc:  Oral Oral Oral  Resp:  18 18 18   Height:      Weight:   119.2 kg (262 lb 12.6 oz)   SpO2:  100% 100% 100%    Intake/Output Summary (Last 24 hours) at 12/25/11 1515 Last data filed at 12/25/11 1300  Gross per 24 hour  Intake   1320 ml  Output   1000 ml  Net    320 ml   Weight change: 0.3 kg (10.6 oz) Exam:   General:  Pt is alert, follows commands appropriately, not in acute distress  HEENT: No icterus, No thrush, No neck mass, [place]/AT  Cardiovascular: Regular rate and rhythm, S1/S2, no rubs, no gallops  Respiratory: Basilar crackles left greater than right, no wheezing, no crackles, no rhonchi  Abdomen: Soft, non tender, non distended, bowel sounds present, no guarding  Extremities: 1+ edema, No lymphangitis, No petechiae, No rashes, no synovitis  Data Reviewed: Basic Metabolic Panel:  Lab 12/24/11 1610 12/23/11 0606 12/22/11 0600 12/21/11 0550 12/20/11 0640  NA 135 134* 138 140 137  K 3.9 4.1 3.6 3.7 3.3*  CL 96 94* 98 100 97  CO2 29 29 29  32 29  GLUCOSE 118* 164* 52* 91 144*  BUN 41* 37* 30* 27* 20  CREATININE 2.09* 1.98* 2.16* 1.99* 1.92*  CALCIUM 9.0 9.1 9.5 9.4 9.0  MG -- -- 2.1 -- 1.6  PHOS -- -- -- -- --   Liver Function Tests: No results found for this basename: AST:5,ALT:5,ALKPHOS:5,BILITOT:5,PROT:5,ALBUMIN:5 in the last 168 hours No results found for this basename: LIPASE:5,AMYLASE:5 in the last 168 hours No results found for this basename: AMMONIA:5 in the last 168 hours CBC:  Lab 12/24/11 0600 12/23/11 0606 12/22/11 0600 12/21/11 0550 12/20/11 0640 12/19/11 0655  WBC 10.7* 11.8* 16.6* 11.2* 13.6* --  NEUTROABS -- 9.3* -- -- -- 9.3*  HGB 9.9* 10.3* 11.1* 10.9* 11.2* --  HCT 29.9* 31.7* 34.4* 33.4* 34.2* --  MCV 95.5 96.4 96.6 97.1 96.3 --  PLT 241 216 283 194 213 --   Cardiac Enzymes:  Lab 12/18/11 2211 12/18/11 1655  CKTOTAL -- --  CKMB -- --  CKMBINDEX -- --  TROPONINI <0.30 <0.30   BNP: No components found with this  basename: POCBNP:5 CBG:  Lab 12/25/11 1135 12/25/11 0613 12/24/11 2158 12/24/11 1609 12/24/11 1104  GLUCAP 197* 162* 125* 84 86    Recent Results (from the past 240 hour(s))  URINE CULTURE     Status: Normal   Collection Time   12/18/11 10:38 AM      Component Value Range Status Comment   Specimen Description URINE, CLEAN CATCH   Final    Special Requests NONE   Final    Culture  Setup Time 12/18/2011 11:50   Final    Colony Count 3,000 COLONIES/ML   Final    Culture INSIGNIFICANT GROWTH   Final    Report Status 12/19/2011 FINAL   Final   CULTURE, BLOOD (ROUTINE X 2)     Status: Normal   Collection Time   12/18/11  4:35 PM      Component Value Range Status Comment   Specimen Description BLOOD LEFT HAND   Final    Special Requests BOTTLES DRAWN AEROBIC ONLY [facility]   Final    Culture  Setup Time 12/18/2011 22:56   Final    Culture     Final    Value: STAPHYLOCOCCUS  SPECIES (COAGULASE NEGATIVE)     Note: RIFAMPIN AND GENTAMICIN SHOULD NOT BE USED AS SINGLE DRUGS FOR TREATMENT OF STAPH INFECTIONS.     5 Note: Gram Stain Report Called to,Read Back By and Verified With: NURSE RASHIDA HANEY AT 6:13PM 9 2013 BY THOMI   Report Status 12/22/2011 FINAL   Final    Organism ID, Bacteria STAPHYLOCOCCUS SPECIES (COAGULASE NEGATIVE)   Final   CULTURE, BLOOD (ROUTINE X 2)     Status: Normal   Collection Time   12/18/11  4:45 PM      Component Value Range Status Comment   Specimen Description BLOOD LEFT HAND   Final    Special Requests BOTTLES DRAWN AEROBIC ONLY 5CC   Final    Culture  Setup Time 12/18/2011 22:57   Final    Culture     Final    Value: STAPHYLOCOCCUS SPECIES (COAGULASE NEGATIVE)     Note: SUSCEPTIBILITIES PERFORMED ON PREVIOUS CULTURE WITHIN THE LAST 5 DAYS.     Note: Gram Stain Report Called to,Read Back By and Verified With: COURTNEY GENTRY 12/19/11 1410 BY SMITJ   Report Status 12/22/2011 FINAL   Final   CULTURE, BLOOD (ROUTINE X 2)     Status: Normal (Preliminary result)    Collection Time   12/21/11  5:50 AM      Component Value Range Status Comment   Specimen Description BLOOD RIGHT ARM   Final    Special Requests BOTTLES DRAWN AEROBIC AND ANAEROBIC 10CC EACH   Final    Culture  Setup Time 12/21/2011 10:12   Final    Culture     Final    Value:        BLOOD CULTURE RECEIVED NO GROWTH TO DATE CULTURE WILL BE HELD FOR 5 DAYS BEFORE ISSUING A FINAL NEGATIVE REPORT   Report Status PENDING   Incomplete   CULTURE, BLOOD (ROUTINE X 2)     Status: Normal (Preliminary result)   Collection Time   12/21/11  6:05 AM      Component Value Range Status Comment   Specimen Description BLOOD LEFT HAND   Final    Special Requests BOTTLES DRAWN AEROBIC AND ANAEROBIC 10CC EACH   Final    Culture  Setup Time 12/21/2011 10:12   Final    Culture     Final    Value:        BLOOD CULTURE RECEIVED NO GROWTH TO DATE CULTURE WILL BE HELD FOR 5 DAYS BEFORE ISSUING A FINAL NEGATIVE REPORT   Report Status PENDING   Incomplete   URINE CULTURE     Status: Normal   Collection Time   12/22/11  3:38 PM      Component Value Range Status Comment   Specimen Description URINE, CATHETERIZED   Final    Special Requests NONE   Final    Culture  Setup Time 12/22/2011 23:27   Final    Colony Count NO GROWTH   Final    Culture NO GROWTH   Final    Report Status 12/24/2011 FINAL   Final      Scheduled Meds:    . allopurinol  150 mg Oral Daily  . carvedilol  25 mg Oral BID WC  .  ceFAZolin (ANCEF) IV  2 g Intravenous Q8H  . furosemide  80 mg Oral BID  . hydrocortisone  20 mg Oral BID  . insulin aspart  0-20 Units Subcutaneous TID WC  . insulin aspart  0-5 Units  Subcutaneous QHS  . insulin aspart protamine-insulin aspart  10 Units Subcutaneous Q breakfast  . insulin aspart protamine-insulin aspart  10 Units Subcutaneous Q supper  . neomycin-bacitracin-polymyxin   Topical Daily  . senna-docusate  1 tablet Oral BID  . sodium chloride  3 mL Intravenous Q12H  . warfarin  2.5 mg Oral ONCE-1800  .  warfarin  2.5 mg Oral ONCE-1800  . Warfarin - Pharmacist Dosing Inpatient   Does not apply q1800  . DISCONTD: carvedilol  12.5 mg Oral BID WC  . DISCONTD: furosemide  80 mg Intravenous BID  . DISCONTD: insulin aspart protamine-insulin aspart  20 Units Subcutaneous Q supper  . DISCONTD: insulin aspart protamine-insulin aspart  25 Units Subcutaneous Q breakfast  . DISCONTD: ketoconazole  400 mg Oral TID  . DISCONTD: lactulose  20 g Oral Q6H   Continuous Infusions:    Zannie Cove, MD  [facility] Pager [phone]  If 7PM-7AM, please contact night-coverage [email or web address] Password [email or web address] 12/25/2011, 3:15 PM   LOS: 7 days

## 2011-12-25 NOTE — Progress Notes (Signed)
Physical Therapy Treatment Patient Details Name: Vincent [name] MRN: [id number] DOB: 03/11/1936 Today's Date: 12/25/2011 Time: 4098-1191 PT Time Calculation (min): 14 min  PT Assessment / Plan / Recommendation Comments on Treatment Session  Pt slowly progressing with mobilty & PT goals.  Required max encouragement throughout session.      Follow Up Recommendations  Skilled nursing facility;Supervision/Assistance - 24 hour    Barriers to Discharge        Equipment Recommendations  Rolling walker with 5" wheels;Defer to next venue    Recommendations for Other Services    Frequency Min 3X/week   Plan Discharge plan remains appropriate;Frequency remains appropriate    Precautions / Restrictions Precautions Precautions: Fall Restrictions Weight Bearing Restrictions: No       Mobility  Bed Mobility Bed Mobility: Supine to Sit;Sitting - Scoot to Edge of Bed Supine to Sit: 2: Max assist Sitting - Scoot to [place] of Bed: 3: Mod assist Details for Bed Mobility Assistance: Pt in sidelying positioning upon arrival.  Assist to lift shouders/trunk to sitting upright.  Cues for sequencing, technique, increased use of UE's.   Transfers Transfers: Sit to Stand;Stand to Sit Sit to Stand: 1: +2 Total assist;With upper extremity assist;From bed;From chair/3-in-1;With armrests Sit to Stand: Patient Percentage: 40% Stand to Sit: 1: +2 Total assist;With upper extremity assist;With armrests;To chair/3-in-1 Stand to Sit: Patient Percentage: 60% Details for Transfer Assistance: Cues for hand placement & technique.  Assist for anterior translation of trunk over BOS, balance, & controlled descent.  Encouraged pt to use rocking motion to gain momentum to achieve standing.  Performed 4x's but required max encouragement for increased reps.   Ambulation/Gait Ambulation/Gait Assistance: 1: +2 Total assist Ambulation/Gait: Patient Percentage: 80% Ambulation Distance (Feet): 5 Feet Assistive device: Rolling  walker Ambulation/Gait Assistance Details: Pt self-limiting.  Stating "I'm tired".  Max encouragement to attempt increased distance with assurance that chair was right behind him but pt refused.   Gait Pattern: Step-to pattern;Decreased stride length;Wide base of support;Trunk flexed Gait velocity: decreased Stairs: No Wheelchair Mobility Wheelchair Mobility: No      PT Goals Acute Rehab PT Goals Time For Goal Achievement: 01/03/12 Potential to Achieve Goals: Fair PT Goal: Sit to Supine/Side - Progress: Not met PT Goal: Sit to Stand - Progress: Not met PT Goal: Stand to Sit - Progress: Not met PT Goal: Ambulate - Progress: Not met  Visit Information  Last PT Received On: 12/25/11 Assistance Needed: +2    Subjective Data      Cognition  Overall Cognitive Status: Appears within functional limits for tasks assessed/performed Arousal/Alertness: Awake/alert Orientation Level: Appears intact for tasks assessed Behavior During Session: [facility] for tasks performed Cognition - Other Comments: pt somewhat self limiting, needed max encouragement to advance ambulation distance    Balance     End of Session PT - End of Session Equipment Utilized During Treatment: Gait belt Activity Tolerance: Patient limited by fatigue;Other (comment) (self-limiting) Patient left: in chair;with call bell/phone within reach Nurse Communication: Mobility status     Verdell Face, [place] [phone] 12/25/2011

## 2011-12-25 NOTE — Progress Notes (Signed)
ANTICOAGULATION CONSULT NOTE - Follow Up Consult  Pharmacy Consult for Coumadin Indication: atrial fibrillation  No Known Allergies  Patient Measurements: Height: 5\' 9"  (175.3 cm) Weight: 262 lb 12.6 oz (119.2 kg) (bed) IBW/kg (Calculated) : 70.7  Heparin Dosing Weight:   Vital Signs: Temp: 98.1 F (36.7 C) (09/11 0536) Temp src: Oral (09/11 0536) BP: 130/77 mmHg (09/11 0536) Pulse Rate: 121  (09/11 0536)  Labs:  Basename 12/25/11 0505 12/24/11 0600 12/23/11 0606  HGB -- 9.9* 10.3*  HCT -- 29.9* 31.7*  PLT -- 241 216  APTT -- -- --  LABPROT 30.8* 30.7* 26.9*  INR 2.90* 2.89* 2.44*  HEPARINUNFRC -- -- --  CREATININE -- 2.09* 1.98*  CKTOTAL -- -- --  CKMB -- -- --  TROPONINI -- -- --    Estimated Creatinine Clearance: 39.5 ml/min (by C-G formula based on Cr of 2.09).  Assessment: Vincent [name] on Coumadin for Afib. INR (2.9) remains therapeutic and has leveled off. Noted missed dose on 9/9 and ketoconazole drug interaction (and increased dosage to MD prescribed regimen) which can increase the INR - will continue with Coumadin 2.5mg . - H/H trending down, Plts wnl - No significant bleeding reported - PTA regimen: 5mg  daily except 2.5mg  M/F  Goal of Therapy:  INR 2-3   Plan:  1. Repeat Coumadin 2.5mg  x 1 today 2. Follow-up AM INR and discharge plans  Vincent [name] [phone] 12/25/2011,8:55 AM

## 2011-12-26 ENCOUNTER — Encounter (HOSPITAL_COMMUNITY): Payer: Self-pay | Admitting: Cardiology

## 2011-12-26 DIAGNOSIS — I5023 Acute on chronic systolic (congestive) heart failure: Secondary | ICD-10-CM

## 2011-12-26 DIAGNOSIS — R7881 Bacteremia: Secondary | ICD-10-CM

## 2011-12-26 DIAGNOSIS — N183 Chronic kidney disease, stage 3 unspecified: Secondary | ICD-10-CM

## 2011-12-26 HISTORY — DX: Chronic kidney disease, stage 3 unspecified: N18.30

## 2011-12-26 LAB — GLUCOSE, CAPILLARY

## 2011-12-26 LAB — BASIC METABOLIC PANEL
CO2: 32 mEq/L (ref 19–32)
Calcium: 9.5 mg/dL (ref 8.4–10.5)
Potassium: 3.8 mEq/L (ref 3.5–5.1)
Sodium: 136 mEq/L (ref 135–145)

## 2011-12-26 LAB — PROTIME-INR: INR: 2.91 — ABNORMAL HIGH (ref 0.00–1.49)

## 2011-12-26 MED ORDER — DILTIAZEM HCL 60 MG PO TABS
60.0000 mg | ORAL_TABLET | Freq: Four times a day (QID) | ORAL | Status: DC
  Administered 2011-12-26 – 2011-12-27 (×5): 60 mg via ORAL
  Filled 2011-12-26 (×8): qty 1

## 2011-12-26 MED ORDER — FUROSEMIDE 40 MG PO TABS
40.0000 mg | ORAL_TABLET | Freq: Two times a day (BID) | ORAL | Status: DC
  Administered 2011-12-26 – 2011-12-27 (×3): 40 mg via ORAL
  Filled 2011-12-26 (×4): qty 1

## 2011-12-26 MED ORDER — WARFARIN SODIUM 2.5 MG PO TABS
2.5000 mg | ORAL_TABLET | Freq: Once | ORAL | Status: AC
  Administered 2011-12-26: 2.5 mg via ORAL
  Filled 2011-12-26: qty 1

## 2011-12-26 NOTE — Consult Note (Addendum)
INFECTIOUS DISEASE CONSULT NOTE  Date of Admission:  12/18/2011  Date of Consult:  12/26/2011  Reason for Consult:Coag Negative Staph (MSSE)  bacteremia Referring Physician: Jomarie Longs  Impression/Recommendation Bacteremia Cellulitis CHF ESRD (stage III), worsening  Would- continue ancef Check TEE Consider having Dr Milas Kocher and the heart failure team eval this patient Home help  Comment- his worsening CHF in the face of bacteremia certainly raises the idea that he has developed valve dysfunction due to endocarditis.  A TTE is only about 60% sensitive for infective endocarditis, TEE is significantly better. At minimum , he will need 2 weeks of anbx, more if TEE positive.  Pt needs home help with making appts, taking meds  Thank you so much for this interesting consult,   [name] [phone]  Vincent [name] is an 76 y.o. male.  HPI: 76 yo M with hx of DM2 (since 1992), CKD stage III, admitted on 9-4 with 1 week of SOB, increasing LE edema. His legs had gotten so large that he was having difficulty lifting them. He had to sleep in a chair and had been only taking 1/2 of his dose of lasix (qday instead of BID). In ED he had afib with RVR and required a cardizem drip. He was afebrile on admission but did have a WBC of 13.5. He had BCx sent due to this and was started on vancomycin (9-5). His Cx returned as 2/2 methicillin sensitive staph epidermidis (MSSE) and his vanco was changed to ancef on 9-8. Repeat BCx drawn 9-7 and are NGTD. He had a TTE on 9-5 showing EF 45-50% (unchanged from 2009). Tmax 99.7 (9-5)  Past Medical History  Diagnosis Date  . Diabetes mellitus   . Hypertension   . CHF (congestive heart failure)   . Cancer   . Pneumonia   . Irregular heartbeat   . Prostate cancer   . Shortness of breath   . GERD (gastroesophageal reflux disease)     Past Surgical History  Procedure Date  . Prostate surgery      No Known Allergies  Medications:  Scheduled:   .  allopurinol  150 mg Oral Daily  . carvedilol  25 mg Oral BID WC  .  ceFAZolin (ANCEF) IV  2 g Intravenous Q8H  . diltiazem  60 mg Oral Q6H  . furosemide  40 mg Oral BID  . hydrocortisone  20 mg Oral BID  . insulin aspart  0-20 Units Subcutaneous TID WC  . insulin aspart  0-5 Units Subcutaneous QHS  . insulin aspart protamine-insulin aspart  10 Units Subcutaneous Q breakfast  . insulin aspart protamine-insulin aspart  10 Units Subcutaneous Q supper  . neomycin-bacitracin-polymyxin   Topical Daily  . senna-docusate  1 tablet Oral BID  . sodium chloride  3 mL Intravenous Q12H  . warfarin  2.5 mg Oral ONCE-1800  . warfarin  2.5 mg Oral ONCE-1800  . Warfarin - Pharmacist Dosing Inpatient   Does not apply q1800  . DISCONTD: diltiazem  30 mg Oral Q6H  . DISCONTD: furosemide  80 mg Intravenous BID  . DISCONTD: furosemide  80 mg Oral BID    Total days of antibiotics 8    ancef    Day 5          Social History:  reports that he has never smoked. He has never used smokeless tobacco. He reports that he does not drink alcohol or use illicit drugs.  History reviewed. No pertinent family history.  General ROS: no  f/c, no change in vision, no headache, no paresthesias, denies trauma to LE.   Blood pressure 130/82, pulse 124, temperature 97.9 F (36.6 C), temperature source Oral, resp. rate 18, height 5\' 9"  (1.753 m), weight 110.451 kg (243 lb 8 oz), SpO2 98.00%. General appearance: alert, cooperative, no distress and slowed mentation Eyes: negative findings: conjunctivae and sclerae normal and pupils equal, round, reactive to light and accomodation Throat: normal findings: oropharynx pink & moist without lesions or evidence of thrush and multiple teeth missing Neck: no adenopathy Lungs: coarse sunds bilaterally, decreased breath sounds.  Heart: irregularly irregular rhythm Abdomen: normal findings: aorta normal and soft, non-tender Extremities: edema firm to knees and 2 superficial  ulcerations on his LLE below the knee. there is no increase in heat, no d/c. there is a halo of erythema.  MSE-When asked how long he has had DM he states since 1942. When i corrected him, he states he retired in 1944. I again redirected him that he was 76 yo then.   Results for orders placed during the hospital encounter of 12/18/11 (from the past 48 hour(s))  GLUCOSE, CAPILLARY     Status: Normal   Collection Time   12/24/11  4:09 PM      Component Value Range Comment   Glucose-Capillary 84  70 - 99 mg/dL    Comment 1 Notify RN      Comment 2 Documented in Chart     GLUCOSE, CAPILLARY     Status: Abnormal   Collection Time   12/24/11  9:58 PM      Component Value Range Comment   Glucose-Capillary 125 (*) 70 - 99 mg/dL    Comment 1 Documented in Chart      Comment 2 Notify RN     PROTIME-INR     Status: Abnormal   Collection Time   12/25/11  5:05 AM      Component Value Range Comment   Prothrombin Time 30.8 (*) 11.6 - 15.2 seconds    INR 2.90 (*) 0.00 - 1.49   GLUCOSE, CAPILLARY     Status: Abnormal   Collection Time   12/25/11  6:13 AM      Component Value Range Comment   Glucose-Capillary 162 (*) 70 - 99 mg/dL   GLUCOSE, CAPILLARY     Status: Abnormal   Collection Time   12/25/11 11:35 AM      Component Value Range Comment   Glucose-Capillary 197 (*) 70 - 99 mg/dL    Comment 1 Notify RN     GLUCOSE, CAPILLARY     Status: Abnormal   Collection Time   12/25/11  3:52 PM      Component Value Range Comment   Glucose-Capillary 291 (*) 70 - 99 mg/dL    Comment 1 Documented in Chart      Comment 2 Notify RN     PROTIME-INR     Status: Abnormal   Collection Time   12/26/11  5:15 AM      Component Value Range Comment   Prothrombin Time 30.9 (*) 11.6 - 15.2 seconds    INR 2.91 (*) 0.00 - 1.49   BASIC METABOLIC PANEL     Status: Abnormal   Collection Time   12/26/11  5:15 AM      Component Value Range Comment   Sodium 136  135 - 145 mEq/L    Potassium 3.8  3.5 - 5.1 mEq/L     Chloride 95 (*) 96 - 112 mEq/L  CO2 32  19 - 32 mEq/L    Glucose, Bld 238 (*) 70 - 99 mg/dL    BUN 46 (*) 6 - 23 mg/dL    Creatinine, Ser 1.61 (*) 0.50 - 1.35 mg/dL    Calcium 9.5  8.4 - 09.6 mg/dL    GFR calc non Af Amer 26 (*) >90 mL/min    GFR calc Af Amer 30 (*) >90 mL/min   GLUCOSE, CAPILLARY     Status: Abnormal   Collection Time   12/26/11  7:10 AM      Component Value Range Comment   Glucose-Capillary 193 (*) 70 - 99 mg/dL    Comment 1 Notify RN      Comment 2 Documented in Chart     GLUCOSE, CAPILLARY     Status: Abnormal   Collection Time   12/26/11 11:31 AM      Component Value Range Comment   Glucose-Capillary 199 (*) 70 - 99 mg/dL    Comment 1 Notify RN         Component Value Date/Time   SDES URINE, CATHETERIZED 12/22/2011 1538   SPECREQUEST NONE 12/22/2011 1538   CULT NO GROWTH 12/22/2011 1538   REPTSTATUS 12/24/2011 FINAL 12/22/2011 1538   No results found. Recent Results (from the past 240 hour(s))  URINE CULTURE     Status: Normal   Collection Time   12/18/11 10:38 AM      Component Value Range Status Comment   Specimen Description URINE, CLEAN CATCH   Final    Special Requests NONE   Final    Culture  Setup Time 12/18/2011 11:50   Final    Colony Count 3,000 COLONIES/ML   Final    Culture INSIGNIFICANT GROWTH   Final    Report Status 12/19/2011 FINAL   Final   CULTURE, BLOOD (ROUTINE X 2)     Status: Normal   Collection Time   12/18/11  4:35 PM      Component Value Range Status Comment   Specimen Description BLOOD LEFT HAND   Final    Special Requests BOTTLES DRAWN AEROBIC ONLY 7CC   Final    Culture  Setup Time 12/18/2011 22:56   Final    Culture     Final    Value: STAPHYLOCOCCUS SPECIES (COAGULASE NEGATIVE)     Note: RIFAMPIN AND GENTAMICIN SHOULD NOT BE USED AS SINGLE DRUGS FOR TREATMENT OF STAPH INFECTIONS.     5 Note: Gram Stain Report Called to,Read Back By and Verified With: NURSE RASHIDA HANEY AT 6:13PM 9 2013 BY THOMI   Report Status 12/22/2011  FINAL   Final    Organism ID, Bacteria STAPHYLOCOCCUS SPECIES (COAGULASE NEGATIVE)   Final   CULTURE, BLOOD (ROUTINE X 2)     Status: Normal   Collection Time   12/18/11  4:45 PM      Component Value Range Status Comment   Specimen Description BLOOD LEFT HAND   Final    Special Requests BOTTLES DRAWN AEROBIC ONLY 5CC   Final    Culture  Setup Time 12/18/2011 22:57   Final    Culture     Final    Value: STAPHYLOCOCCUS SPECIES (COAGULASE NEGATIVE)     Note: SUSCEPTIBILITIES PERFORMED ON PREVIOUS CULTURE WITHIN THE LAST 5 DAYS.     Note: Gram Stain Report Called to,Read Back By and Verified With: COURTNEY GENTRY 12/19/11 1410 BY SMITJ   Report Status 12/22/2011 FINAL   Final   CULTURE, BLOOD (ROUTINE X  2)     Status: Normal (Preliminary result)   Collection Time   12/21/11  5:50 AM      Component Value Range Status Comment   Specimen Description BLOOD RIGHT ARM   Final    Special Requests BOTTLES DRAWN AEROBIC AND ANAEROBIC 10CC EACH   Final    Culture  Setup Time 12/21/2011 10:12   Final    Culture     Final    Value:        BLOOD CULTURE RECEIVED NO GROWTH TO DATE CULTURE WILL BE HELD FOR 5 DAYS BEFORE ISSUING A FINAL NEGATIVE REPORT   Report Status PENDING   Incomplete   CULTURE, BLOOD (ROUTINE X 2)     Status: Normal (Preliminary result)   Collection Time   12/21/11  6:05 AM      Component Value Range Status Comment   Specimen Description BLOOD LEFT HAND   Final    Special Requests BOTTLES DRAWN AEROBIC AND ANAEROBIC 10CC EACH   Final    Culture  Setup Time 12/21/2011 10:12   Final    Culture     Final    Value:        BLOOD CULTURE RECEIVED NO GROWTH TO DATE CULTURE WILL BE HELD FOR 5 DAYS BEFORE ISSUING A FINAL NEGATIVE REPORT   Report Status PENDING   Incomplete   URINE CULTURE     Status: Normal   Collection Time   12/22/11  3:38 PM      Component Value Range Status Comment   Specimen Description URINE, CATHETERIZED   Final    Special Requests NONE   Final    Culture  Setup Time  12/22/2011 23:27   Final    Colony Count NO GROWTH   Final    Culture NO GROWTH   Final    Report Status 12/24/2011 FINAL   Final       12/26/2011, 1:27 PM     LOS: 8 days

## 2011-12-26 NOTE — Plan of Care (Signed)
Problem: Phase I Progression Outcomes Goal: EF % per last Echo/documented,Core Reminder form on chart Outcome: Completed/Met Date Met:  12/26/11 45-50% 12-19-11

## 2011-12-26 NOTE — Progress Notes (Signed)
Nutrition Brief Note  Patient identified on the Malnutrition Screening Tool (MST) report for weight loss, generating a score of 2. MST indicates an amount weight loss, did not indicate unintentional weight loss. Pt has lost weight loss this admission, but it has been from fluids with ongoing diuretics. Pt has had good appetite this admission.   Body mass index is 35.96 kg/(m^2). Pt meets criteria for Obesity class 2 based on current BMI.   Current diet order is Carb Mod medium, patient is consuming approximately 100% of meals at this time. Labs and medications reviewed.   No nutrition interventions warranted at this time. If nutrition issues arise, please consult RD.   Clarene Duke RD, LDN Pager [phone] After Hours pager [phone]

## 2011-12-26 NOTE — Progress Notes (Signed)
ANTICOAGULATION CONSULT NOTE - Follow Up Consult  Pharmacy Consult for Coumadin Indication: atrial fibrillation  No Known Allergies  Patient Measurements: Height: 5\' 9"  (175.3 cm) Weight: 243 lb 8 oz (110.451 kg) (scale c) IBW/kg (Calculated) : 70.7  Heparin Dosing Weight:   Vital Signs: Temp: 97.9 F (36.6 C) (09/12 0517) Temp src: Oral (09/12 0517) BP: 131/91 mmHg (09/12 0517) Pulse Rate: 124  (09/12 0517)  Labs:  Basename 12/26/11 0515 12/25/11 0505 12/24/11 0600  HGB -- -- 9.9*  HCT -- -- 29.9*  PLT -- -- 241  APTT -- -- --  LABPROT 30.9* 30.8* 30.7*  INR 2.91* 2.90* 2.89*  HEPARINUNFRC -- -- --  CREATININE 2.30* -- 2.09*  CKTOTAL -- -- --  CKMB -- -- --  TROPONINI -- -- --    Estimated Creatinine Clearance: 34.5 ml/min (by C-G formula based on Cr of 2.3).  Assessment: 76yom on Coumadin for Afib. INR (2.91) remains stable and therapeutic. Noted discontinuation of ketoconazole which may affect Coumadin regimen - monitor closely. - No CBC this AM - No bleeding reported - PTA regimen: 5mg  daily except 2.5mg  M/F  Goal of Therapy:  INR 2-3   Plan:  1. Coumadin 2.5mg  po x 1 today 2. Follow-up AM INR and resumption of ketoconazole  [name] [phone] 12/26/2011,8:51 AM

## 2011-12-26 NOTE — Progress Notes (Addendum)
[facility] PROGRESS NOTE  Vincent [name] WNU:[id number] DOB: 1935/04/22 DOA: 12/18/2011 PCP: [name], MD  Assessment/Plan:  Acute on chronic systolic CHF exacerbation- -Remains clinically slightly overloaded -continue furosemide, change to PO to 40 mg every 12, due to worsening CKD -Hold on ACE inhibitor at this time due to his renal insufficiency  -continue carvedilol to 12.5 mg twice a day  -neg 4.8 cc for the admission   Cardiorenal syndrome / CKD stage III : -creatinine slowly climbing, cut down diuretics Baseline creatinine 1.96 in 1012  Bacteremia-coagulase negative Staphylococcus  -This is likely the source of the patient's CHF decompensation  -Patient denies any prosthetic devices or plates or screws -likely source are skin ulcerations on his legs -Continue cefazolin IV- -Today is day 8 of antibiotics total -will request ID eval for duration of antibiotics -Repeat blood cultures(9/7) negative so far, 2D echocardiogram negative for vegetations -If needs long term abx may need LTAC since CKD prohibits PICC    Paroxysmal Atrial fibrillation with rapid ventricular response  - was initially on cardizem gtt -Rate up, on carvedilol, increase cardizem to 60mg  Q6, if HR more stable by tomorrow will change to cardizem CD  -Echocardiogram shows EF 45-50%, TSH 2.090  - continue warfarin per pharmacy dosing   Diabetes mellitus type 2, insulin-dependent  -Hemoglobin A1c 8.8, LDL 70  -stable, continue 03/47 insulin  -Monitor CBG, gradually increase insulin as needed   Medical noncompliance  -Dr.Tat had conversation with the patient's daughter, it appears that the patient has difficulty with following up his outpatient appointments as well as taking his medications properly   Deconditioning  -PT evaluation, ? LTAC depending on duration of IV abx  Constipation  -Continue Senokot  -Improved  Antibiotics:  Vancomycin September 5>>> September8  Cefazolin September  8>>>   Code Status: Full  Family Communication: Daughter Annabelle Harman) [phone] , June [phone] Disposition Plan: SNF when medically stable    Procedures/Studies: Dg Chest Port 1 View  12/21/2011  *RADIOLOGY REPORT*  Clinical Data: 76 year old male with congestive heart failure. Shortness of breath.  PORTABLE CHEST - 1 VIEW  Comparison: 12/18/2011 and earlier.  Findings: AP portable semi upright view 1804 hours.  Stable lung volumes.  Stable cardiac size and mediastinal contours.  No pneumothorax.  No large effusion.  Retrocardiac streaky opacity compatible with atelectasis.  No overt pulmonary edema.  Chronic right lateral rib fractures.  IMPRESSION: Retrocardiac atelectasis, otherwise no acute cardiopulmonary abnormality.   Original Report Authenticated By: Harley Hallmark, M.D.    Dg Chest Port 1 View  12/18/2011  *RADIOLOGY REPORT*  Clinical Data: Atrial fibrillation.  High blood pressure. Diabetes. Shortness of breath.  PORTABLE CHEST - 1 VIEW  Comparison: 03/02/2011.  Findings: Remote right rib fractures.  Granuloma right upper lobe unchanged since 2008.  Cardiomegaly.  Pulmonary vascular prominence most notable centrally.  No segmental consolidation or gross pneumothorax.  The patient would eventually benefit from follow-up two-view chest with cardiac leads removed.  IMPRESSION: Cardiomegaly.  Pulmonary vascular prominence most notable centrally.  Calcified slightly tortuous aorta.  Stable granuloma right upper lobe.   Original Report Authenticated By: Fuller Canada, M.D.        Subjective: Patient is feeling better. He denies any chest pain, shortness breath, nausea, vomiting, diarrhea, abdominal pain, rashes, dizziness.  Objective: Filed Vitals:   12/25/11 0536 12/25/11 1316 12/25/11 2150 12/26/11 0517  BP: 130/77 113/59 115/79 131/91  Pulse: 121 122 121 124  Temp: 98.1 F (36.7 C) 98.4 F (36.9  C) 98.4 F (36.9 C) 97.9 F (36.6 C)  TempSrc: Oral Oral Oral Oral  Resp: 18 18 18  18   Height:      Weight: 119.2 kg (262 lb 12.6 oz)   110.451 kg (243 lb 8 oz)  SpO2: 100% 100% 100% 98%    Intake/Output Summary (Last 24 hours) at 12/26/11 1120 Last data filed at 12/26/11 0826  Gross per 24 hour  Intake    890 ml  Output   1725 ml  Net   -835 ml   Weight change: -8.749 kg (-19 lb 4.6 oz) Exam:   General:  Pt is alert, follows commands appropriately, not in acute distress  HEENT: No icterus, No thrush, No neck mass, /AT  Cardiovascular: Irregular rate and rhythm, S1/S2, no rubs, no gallops  Respiratory: Basilar crackles left greater than right, no wheezing, no crackles, no rhonchi  Abdomen: Soft, non tender, non distended, bowel sounds present, no guarding  Extremities: 1+ edema, No lymphangitis, No petechiae, No rashes, no synovitis  Left leg ulcers  Data Reviewed: Basic Metabolic Panel:  Lab 12/26/11 0454 12/24/11 0600 12/23/11 0606 12/22/11 0600 12/21/11 0550 12/20/11 0640  NA 136 135 134* 138 140 --  K 3.8 3.9 4.1 3.6 3.7 --  CL 95* 96 94* 98 100 --  CO2 32 29 29 29  32 --  GLUCOSE 238* 118* 164* 52* 91 --  BUN 46* 41* 37* 30* 27* --  CREATININE 2.30* 2.09* 1.98* 2.16* 1.99* --  CALCIUM 9.5 9.0 9.1 9.5 9.4 --  MG -- -- -- 2.1 -- 1.6  PHOS -- -- -- -- -- --   Liver Function Tests: No results found for this basename: AST:5,ALT:5,ALKPHOS:5,BILITOT:5,PROT:5,ALBUMIN:5 in the last 168 hours No results found for this basename: LIPASE:5,AMYLASE:5 in the last 168 hours No results found for this basename: AMMONIA:5 in the last 168 hours CBC:  Lab 12/24/11 0600 12/23/11 0606 12/22/11 0600 12/21/11 0550 12/20/11 0640  WBC 10.7* 11.8* 16.6* 11.2* 13.6*  NEUTROABS -- 9.3* -- -- --  HGB 9.9* 10.3* 11.1* 10.9* 11.2*  HCT 29.9* 31.7* 34.4* 33.4* 34.2*  MCV 95.5 96.4 96.6 97.1 96.3  PLT 241 216 283 194 213   Cardiac Enzymes: No results found for this basename: CKTOTAL:5,CKMB:5,CKMBINDEX:5,TROPONINI:5 in the last 168 hours BNP: No components found  with this basename: POCBNP:5 CBG:  Lab 12/26/11 0710 12/25/11 1552 12/25/11 1135 12/25/11 0613 12/24/11 2158  GLUCAP 193* 291* 197* 162* 125*    Recent Results (from the past 240 hour(s))  URINE CULTURE     Status: Normal   Collection Time   12/18/11 10:38 AM      Component Value Range Status Comment   Specimen Description URINE, CLEAN CATCH   Final    Special Requests NONE   Final    Culture  Setup Time 12/18/2011 11:50   Final    Colony Count 3,000 COLONIES/ML   Final    Culture INSIGNIFICANT GROWTH   Final    Report Status 12/19/2011 FINAL   Final   CULTURE, BLOOD (ROUTINE X 2)     Status: Normal   Collection Time   12/18/11  4:35 PM      Component Value Range Status Comment   Specimen Description BLOOD LEFT HAND   Final    Special Requests BOTTLES DRAWN AEROBIC ONLY [facility]   Final    Culture  Setup Time 12/18/2011 22:56   Final    Culture     Final    Value: STAPHYLOCOCCUS  SPECIES (COAGULASE NEGATIVE)     Note: RIFAMPIN AND GENTAMICIN SHOULD NOT BE USED AS SINGLE DRUGS FOR TREATMENT OF STAPH INFECTIONS.     5 Note: Gram Stain Report Called to,Read Back By and Verified With: NURSE RASHIDA HANEY AT 6:13PM 9 2013 BY THOMI   Report Status 12/22/2011 FINAL   Final    Organism ID, Bacteria STAPHYLOCOCCUS SPECIES (COAGULASE NEGATIVE)   Final   CULTURE, BLOOD (ROUTINE X 2)     Status: Normal   Collection Time   12/18/11  4:45 PM      Component Value Range Status Comment   Specimen Description BLOOD LEFT HAND   Final    Special Requests BOTTLES DRAWN AEROBIC ONLY 5CC   Final    Culture  Setup Time 12/18/2011 22:57   Final    Culture     Final    Value: STAPHYLOCOCCUS SPECIES (COAGULASE NEGATIVE)     Note: SUSCEPTIBILITIES PERFORMED ON PREVIOUS CULTURE WITHIN THE LAST 5 DAYS.     Note: Gram Stain Report Called to,Read Back By and Verified With: COURTNEY GENTRY 12/19/11 1410 BY SMITJ   Report Status 12/22/2011 FINAL   Final   CULTURE, BLOOD (ROUTINE X 2)     Status: Normal (Preliminary  result)   Collection Time   12/21/11  5:50 AM      Component Value Range Status Comment   Specimen Description BLOOD RIGHT ARM   Final    Special Requests BOTTLES DRAWN AEROBIC AND ANAEROBIC 10CC EACH   Final    Culture  Setup Time 12/21/2011 10:12   Final    Culture     Final    Value:        BLOOD CULTURE RECEIVED NO GROWTH TO DATE CULTURE WILL BE HELD FOR 5 DAYS BEFORE ISSUING A FINAL NEGATIVE REPORT   Report Status PENDING   Incomplete   CULTURE, BLOOD (ROUTINE X 2)     Status: Normal (Preliminary result)   Collection Time   12/21/11  6:05 AM      Component Value Range Status Comment   Specimen Description BLOOD LEFT HAND   Final    Special Requests BOTTLES DRAWN AEROBIC AND ANAEROBIC 10CC EACH   Final    Culture  Setup Time 12/21/2011 10:12   Final    Culture     Final    Value:        BLOOD CULTURE RECEIVED NO GROWTH TO DATE CULTURE WILL BE HELD FOR 5 DAYS BEFORE ISSUING A FINAL NEGATIVE REPORT   Report Status PENDING   Incomplete   URINE CULTURE     Status: Normal   Collection Time   12/22/11  3:38 PM      Component Value Range Status Comment   Specimen Description URINE, CATHETERIZED   Final    Special Requests NONE   Final    Culture  Setup Time 12/22/2011 23:27   Final    Colony Count NO GROWTH   Final    Culture NO GROWTH   Final    Report Status 12/24/2011 FINAL   Final      Scheduled Meds:    . allopurinol  150 mg Oral Daily  . carvedilol  25 mg Oral BID WC  .  ceFAZolin (ANCEF) IV  2 g Intravenous Q8H  . diltiazem  60 mg Oral Q6H  . furosemide  40 mg Oral BID  . hydrocortisone  20 mg Oral BID  . insulin aspart  0-20 Units Subcutaneous TID  WC  . insulin aspart  0-5 Units Subcutaneous QHS  . insulin aspart protamine-insulin aspart  10 Units Subcutaneous Q breakfast  . insulin aspart protamine-insulin aspart  10 Units Subcutaneous Q supper  . neomycin-bacitracin-polymyxin   Topical Daily  . senna-docusate  1 tablet Oral BID  . sodium chloride  3 mL Intravenous Q12H   . warfarin  2.5 mg Oral ONCE-1800  . warfarin  2.5 mg Oral ONCE-1800  . Warfarin - Pharmacist Dosing Inpatient   Does not apply q1800  . DISCONTD: diltiazem  30 mg Oral Q6H  . DISCONTD: furosemide  80 mg Intravenous BID  . DISCONTD: furosemide  80 mg Oral BID   Continuous Infusions:    Zannie Cove, MD  [facility] Pager [phone]  If 7PM-7AM, please contact night-coverage [email or web address] Password [email or web address] 12/26/2011, 11:20 AM   LOS: 8 days

## 2011-12-26 NOTE — Consult Note (Signed)
Reason for Consult: ? Endocarditis, acute on chronic systolic heart failure  Cardiologist : Dr. Rennis Golden  Referring Physician: Dr. [name] is an 76 y.o. male.    Chief Complaint:  Dyspnea on admit  HPI:  76 year old male who presented 12/18/2011 with a one-week history of increasing shortness of breath and worsening bilateral lower extremity edema. The patient stated that he had noted increasing lower extremity edema to the point where he was unable to lift his legs because they feel so heavy. Of note, the patient stated that he is supposed to take Lasix 80 mg twice a day, but he stated that he only takes it once daily. In addition, he seemed to have very poor insight on his medications. In fact, the patient stated that the only reason he came to the hospital was because his wife urged him to do so because he was having difficulty lifting his legs. He denies any other focal extremity weakness, syncope, dizziness, visual changes, dysarthria.   The patient stated that the past 24 hours prior to admit he has had to sleep sitting up in a chair because of his dyspnea. He denied any chest pain, hemoptysis, fevers, chills, nausea, vomiting, diarrhea. The patient was given 80 mg of intravenous Lasix in the emergency department from which the patient stated that he is 50% better with regard to his breathing. In addition the patient was found to have a fibrillation with a rapid ventricular response of 150 in the emergency department. He was given Cardizem 20 mg IV x1 followed by a Cardizem drip at 10 mg hour. He remained hemodynamically stable at this time. Echocardiogram on every 12 2009 showed ejection fraction of 50.    He was afebrile on admission but did have a WBC of 13.5. He had BCx sent due to this and was started on vancomycin (9-5). His Cx returned as 2/2 methicillin sensitive staph epidermidis (MSSE) and his vanco was changed to ancef on 9-8. Repeat BCx drawn 9-7 and are NGTD. He had a TTE  on 9-5 showing EF 45-50% (unchanged from 2009).   Other cardiac history with 1. Diastolic heart failure. 2. Underwent cardiac catheterization in 2009  with patent coronary arteries. EF at that time was 50% and he had moderate pulmonary hypertension with a pulmonary artery systolic pressure of 50. A 2D echo was last done December 11, 2010, with poor  windows, difficult to estimate the EF. 3. Atrial fib, paroxysmal with recent hospitalization. In August, he  was Aflutter with rapid ventricular response, that rate controlled the discharged. Last office visit with Dr. Rennis Golden in June 2012. He had atrial fib and medications were adjusted.  In 2012 he was discharged in [facility].  Last seen by Dr. Rennis Golden 5/13 in [facility] and he was stable at that time.  He has been diagnosed with bacteremia and worsening CHF.  Concern is for endocarditis and length of time for antibiotics.    2D Echo done 12/19/11 Left ventricle: The cavity size was normal. Wall thickness was increased in a pattern of mild LVH. Systolic function was mildly reduced. The estimated ejection fraction was in the range of 45% to 50%. Atrial fibrillation and technical difficulties prevent evaluation of LV diastolic function. - Left atrium: The atrium was mildly dilated.  Cardiac enzymes have been negative.  Past Medical History  Diagnosis Date  . Diabetes mellitus   . Hypertension   . CHF (congestive heart failure)   . Cancer   . Pneumonia   .  Irregular heartbeat   . Prostate cancer   . Shortness of breath   . GERD (gastroesophageal reflux disease)   . Acute on chronic diastolic CHF (congestive heart failure) 12/18/2011  . Atrial fibrillation with rapid ventricular response, history of PAF was in [facility] in 08/2011 12/18/2011  . CKD (chronic kidney disease) stage 3, GFR 30-59 ml/min 12/26/2011    Past Surgical History  Procedure Date  . Prostate surgery     History reviewed. No pertinent family history. not significant to this admit Social History:   reports that he has never smoked. He has never used smokeless tobacco. He reports that he does not drink alcohol or use illicit drugs.  Allergies: No Known Allergies  Medications Prior to Admission  Medication Sig Dispense Refill  . allopurinol (ZYLOPRIM) 300 MG tablet Take 300 mg by mouth daily.        . carvedilol (COREG) 12.5 MG tablet Take 25 mg by mouth every morning.       . furosemide (LASIX) 80 MG tablet Take 80 mg by mouth 1 day or 1 dose.       . hydrocortisone (CORTEF) 20 MG tablet Take 20 mg by mouth 2 (two) times daily.      . insulin aspart (NOVOLOG) 100 UNIT/ML injection Inject into the skin as needed.       . insulin NPH-insulin regular (NOVOLIN 70/30) (70-30) 100 UNIT/ML injection Inject 55 Units into the skin daily with breakfast.       . ketoconazole (NIZORAL) 200 MG tablet Take 400 mg by mouth 3 (three) times daily.      Marland Kitchen warfarin (COUMADIN) 2.5 MG tablet Take 2.5 mg by mouth 2 (two) times a week. Mondays and Fridays      . warfarin (COUMADIN) 5 MG tablet Take 5 mg by mouth See admin instructions. Tuesday, Wednesday, Thursday,  Saturday and Sunday        Results for orders placed during the hospital encounter of 12/18/11 (from the past 48 hour(s))  GLUCOSE, CAPILLARY     Status: Abnormal   Collection Time   12/24/11  9:58 PM      Component Value Range Comment   Glucose-Capillary 125 (*) 70 - 99 mg/dL    Comment 1 Documented in Chart      Comment 2 Notify RN     PROTIME-INR     Status: Abnormal   Collection Time   12/25/11  5:05 AM      Component Value Range Comment   Prothrombin Time 30.8 (*) 11.6 - 15.2 seconds    INR 2.90 (*) 0.00 - 1.49   GLUCOSE, CAPILLARY     Status: Abnormal   Collection Time   12/25/11  6:13 AM      Component Value Range Comment   Glucose-Capillary 162 (*) 70 - 99 mg/dL   GLUCOSE, CAPILLARY     Status: Abnormal   Collection Time   12/25/11 11:35 AM      Component Value Range Comment   Glucose-Capillary 197 (*) 70 - 99 mg/dL    Comment  1 Notify RN     GLUCOSE, CAPILLARY     Status: Abnormal   Collection Time   12/25/11  3:52 PM      Component Value Range Comment   Glucose-Capillary 291 (*) 70 - 99 mg/dL    Comment 1 Documented in Chart      Comment 2 Notify RN     GLUCOSE, CAPILLARY     Status: Abnormal  Collection Time   12/25/11 10:23 PM      Component Value Range Comment   Glucose-Capillary 219 (*) 70 - 99 mg/dL    Comment 1 Notify RN      Comment 2 Documented in Chart     PROTIME-INR     Status: Abnormal   Collection Time   12/26/11  5:15 AM      Component Value Range Comment   Prothrombin Time 30.9 (*) 11.6 - 15.2 seconds    INR 2.91 (*) 0.00 - 1.49   BASIC METABOLIC PANEL     Status: Abnormal   Collection Time   12/26/11  5:15 AM      Component Value Range Comment   Sodium 136  135 - 145 mEq/L    Potassium 3.8  3.5 - 5.1 mEq/L    Chloride 95 (*) 96 - 112 mEq/L    CO2 32  19 - 32 mEq/L    Glucose, Bld 238 (*) 70 - 99 mg/dL    BUN 46 (*) 6 - 23 mg/dL    Creatinine, Ser 1.61 (*) 0.50 - 1.35 mg/dL    Calcium 9.5  8.4 - 09.6 mg/dL    GFR calc non Af Amer 26 (*) >90 mL/min    GFR calc Af Amer 30 (*) >90 mL/min   GLUCOSE, CAPILLARY     Status: Abnormal   Collection Time   12/26/11  7:10 AM      Component Value Range Comment   Glucose-Capillary 193 (*) 70 - 99 mg/dL    Comment 1 Notify RN      Comment 2 Documented in Chart     GLUCOSE, CAPILLARY     Status: Abnormal   Collection Time   12/26/11 11:31 AM      Component Value Range Comment   Glucose-Capillary 199 (*) 70 - 99 mg/dL    Comment 1 Notify RN     GLUCOSE, CAPILLARY     Status: Abnormal   Collection Time   12/26/11  4:28 PM      Component Value Range Comment   Glucose-Capillary 195 (*) 70 - 99 mg/dL    Comment 1 Notify RN      No results found.  ROS: General: difficulty ambulating due to swollen legs Skin:legs swollen HEENT:no blurred visoin CV:no chest pain PUL:+SOB GI:no diarrhea or constipation GU:no dysuria EA:VWUJ swollen,    Neuro:no syncope Endo:+ Dm   Blood pressure 138/80, pulse 107, temperature 98 F (36.7 C), temperature source Oral, resp. rate 18, height 5\' 9"  (1.753 m), weight 110.451 kg (243 lb 8 oz), SpO2 100.00%. PE: General appearance: alert, cooperative, appears stated age, no distress, moderately obese and pleasant mood & affect Neck: JVD - several cm above sternal notch, no adenopathy, no carotid bruit, supple, symmetrical, trachea midline and thyroid not enlarged, symmetric, no tenderness/mass/nodules Lungs: diminished breath sounds bibasilar and Mild rales with no wheezes or rhonchi Heart: irregularly irregular rhythm, S1, S2 normal and Note M./[name]./G. Abdomen: soft, non-tender; bowel sounds normal; no masses,  no organomegaly and Obese Extremities: edema 3+ tense bilateral edema to midthigh  and Pitting sacral edema Pulses: 2+ and symmetric But unable to palpate pedal pulses due to edema Skin: Skin color, texture, turgor normal. No rashes or lesions or Left lower leg ulcers x2 roughly 2 cm in diameter one in the upper medial leg and one on the lower lateral leg Neurologic: Grossly normal   Assessment/Plan Principal Problem:  *Acute on chronic diastolic CHF (congestive heart failure)  Active Problems:  Hypertension  Irregular heartbeat  Prostate cancer  Atrial fibrillation with rapid ventricular response, history of PAF was in SR in 08/2011  DM2 (diabetes mellitus, type 2)  Leukocytosis  Bacteremia  CKD (chronic kidney disease) stage 3, GFR 30-59 ml/min  PLAN: Pt with previous history of diastolic Heart failure, and PAF, but had been maintaining SR at least in May. -4,800 since admit but weight does not correlate.  On Lasix 40 mg BID Continues in A fib/flutter with HR up to 124  And down to 80.  Is on cardizem 60 po QID.  Will schedule TEE in Am, NPO after MN.  We could cardiovert if no clots with TEE.  He was subtherapeutic on admit with INR.   Now INR is 2.91.    There has been an issue  with compliance with coumadin, ? Change to Xarelto?  Dr. Herbie Baltimore has seen and evaluated- his exam above.  Vincent [name],Vincent [name] 12/26/2011, 7:04 PM  I've seen and examined the patient along with Mr. Annie Paras, NP. I also reviewed the chart including Dr. Blanchie Dessert note from May 2013. I reviewed Vincent's findings, summary and initial recommendations as noted above. Exam was performed by me.  Vincent [name] is a 76 year old with a history of chronic diastolic heart failure with chronic lower extremity edema of the exam in May 2013 along the proximal atrial fibrillation, diabetes type 2, morbid obesity, hypertension and stage III chronic kidney disease. He was admitted to the hospitalist service for edema, inability to do to hold his weight with his legs and shortness of breath with exertion. He was noted age of fibrillation with relatively controlled rate. He was also found to have ulcers on his left leg with positive blood cultures for to 2 staph epidermidis. Repeat cultures of the negative. However based on infectious disease consultation recommendations we are requested to provide assistance with transesophageal echocardiogram to rule out endocarditis.     He has diuresed relatively well in his hospitalization but continues to have pitting edema. He has improved symptomatically however. He is having some symptoms of sepsis.  His rate is relatively well-controlled on the carvedilol and diltiazem which seems to be doing the job of maintaining his blood pressure stable. I think he may benefit from additional dose of IV Lasix over a couple days of oral Lasix to for his recent renal function stabilize is acceptable => is in her dosing with IV Lasix tomorrow.  He is scheduled for TEE by Dr. Rennis Golden in the morning and would consider adding cardioversion following the TEE to restore sinus rhythm and hopefully improve diastolic function. He has been therapeutic INR since admission but was subtherapeutic on arrival. The need of  close regulation monitoring post cardioversion if this is performed.  We will attempt to organize this cardioversion in the morning as it is after hours at the time of this consultation, and organizing anesthesiology for cardioversion is not guaranteed.  He will maintain his n.p.o after midnight for the TEE, therefore cardioversion was to the be a matter of anesthesiology availability.  I would defer further diuresis plans to Dr. Rennis Golden after being seen in his morning labs and urine output on oral Lasix.  Marykay Lex, M.D., M.S. [facility] [address] [place], [place]  [address]  [phone] Pager # [phone]  12/26/2011 7:49 PM

## 2011-12-26 NOTE — Progress Notes (Signed)
Physical Therapy Treatment Patient Details Name: COWEN PESQUEIRA MRN: [id number] DOB: 10-15-1935 Today's Date: 12/26/2011 Time: 4098-1191 PT Time Calculation (min): 17 min  PT Assessment / Plan / Recommendation Comments on Treatment Session  Pt was able to increase ambulation distance today but once he says he is tired he refuses to attempt increase in distance.  Pt cont's to require +2 for sit<>stand transfers & +2 to follow with recliner during ambulation due to decreased activity tolerance.      Follow Up Recommendations  Skilled nursing facility;Supervision/Assistance - 24 hour    Barriers to Discharge        Equipment Recommendations  Rolling walker with 5" wheels;Defer to next venue    Recommendations for Other Services    Frequency Min 3X/week   Plan Discharge plan remains appropriate;Frequency remains appropriate    Precautions / Restrictions Precautions Precautions: Fall Restrictions Weight Bearing Restrictions: No       Mobility  Bed Mobility Bed Mobility: Not assessed Transfers Transfers: Sit to Stand;Stand to Sit Sit to Stand: 1: +2 Total assist;With upper extremity assist;With armrests;From chair/3-in-1 Sit to Stand: Patient Percentage: 50% Stand to Sit: 1: +2 Total assist;With upper extremity assist;With armrests;To chair/3-in-1 Stand to Sit: Patient Percentage: 70% Details for Transfer Assistance: cues for hand placement & technique, & to rock back<>forth for momentum.  Progressing but still requiring +2 to achieve standing.   Ambulation/Gait Ambulation/Gait Assistance: 1: +2 Total assist (+2 to follow with recliner) Ambulation/Gait: Patient Percentage: 90% Ambulation Distance (Feet): 25 Feet Assistive device: Rolling walker Ambulation/Gait Assistance Details: Increased distance today but cont's to fatigue quickly.  Assist for balance & safety.   Gait Pattern: Step-to pattern;Decreased stride length;Wide base of support;Trunk flexed Gait velocity:  decreased Stairs: No Wheelchair Mobility Wheelchair Mobility: No      PT Goals Acute Rehab PT Goals Time For Goal Achievement: 01/03/12 Potential to Achieve Goals: Fair PT Goal: Sit to Stand - Progress: Progressing toward goal PT Goal: Stand to Sit - Progress: Progressing toward goal PT Goal: Ambulate - Progress: Progressing toward goal  Visit Information  Last PT Received On: 12/26/11 Assistance Needed: +2    Subjective Data      Cognition  Overall Cognitive Status: Appears within functional limits for tasks assessed/performed Arousal/Alertness: Awake/alert Orientation Level: Appears intact for tasks assessed Behavior During Session: [facility] for tasks performed Cognition - Other Comments: pt somewhat self limiting, needed max encouragement to advance ambulation distance    Balance     End of Session PT - End of Session Equipment Utilized During Treatment: Gait belt Activity Tolerance: Patient limited by pain Patient left: in chair;with call bell/phone within reach Nurse Communication: Mobility status     Verdell Face, [place] [phone] 12/26/2011

## 2011-12-26 NOTE — Progress Notes (Signed)
CSW spoke with patient's wife and she visited [facility] yesterday and would like to have a private room at their facility. CSW explained to patient's wife that this would only be possible if their were beds available on the day of d/c. CSW will follow up with [facility] today to check availability. CSW will continue to follow.   Sabino Niemann, MSW, [employer] [phone]

## 2011-12-27 ENCOUNTER — Encounter (HOSPITAL_COMMUNITY): Payer: Self-pay | Admitting: Internal Medicine

## 2011-12-27 ENCOUNTER — Inpatient Hospital Stay
Admission: AD | Admit: 2011-12-27 | Discharge: 2012-01-14 | Disposition: A | Discharge status: Home / Self Care | Payer: Self-pay | Source: Ambulatory Visit | Attending: Internal Medicine | Admitting: Internal Medicine

## 2011-12-27 ENCOUNTER — Encounter (HOSPITAL_COMMUNITY): Payer: Self-pay | Admitting: Anesthesiology

## 2011-12-27 ENCOUNTER — Inpatient Hospital Stay (HOSPITAL_COMMUNITY): Payer: Medicare Other | Admitting: Anesthesiology

## 2011-12-27 ENCOUNTER — Encounter (HOSPITAL_COMMUNITY)
Admission: EM | Disposition: A | Discharge status: Nursing Home (Includes ICF) | Payer: Self-pay | Source: Home / Self Care | Attending: Internal Medicine

## 2011-12-27 DIAGNOSIS — I513 Intracardiac thrombosis, not elsewhere classified: Secondary | ICD-10-CM | POA: Diagnosis not present

## 2011-12-27 DIAGNOSIS — I509 Heart failure, unspecified: Secondary | ICD-10-CM

## 2011-12-27 HISTORY — PX: CARDIOVERSION: SHX1299

## 2011-12-27 HISTORY — PX: TEE WITHOUT CARDIOVERSION: SHX5443

## 2011-12-27 LAB — CULTURE, BLOOD (ROUTINE X 2): Culture: NO GROWTH

## 2011-12-27 LAB — PROTIME-INR
INR: 3.16 — ABNORMAL HIGH (ref 0.00–1.49)
Prothrombin Time: 32.9 seconds — ABNORMAL HIGH (ref 11.6–15.2)

## 2011-12-27 LAB — BASIC METABOLIC PANEL
Calcium: 9.1 mg/dL (ref 8.4–10.5)
GFR calc Af Amer: 34 mL/min — ABNORMAL LOW (ref >90)
GFR calc non Af Amer: 30 mL/min — ABNORMAL LOW (ref >90)
Glucose, Bld: 255 mg/dL — ABNORMAL HIGH (ref 70–99)
Sodium: 136 mEq/L (ref 135–145)

## 2011-12-27 SURGERY — ECHOCARDIOGRAM, TRANSESOPHAGEAL
Anesthesia: General

## 2011-12-27 MED ORDER — PROPOFOL 10 MG/ML IV BOLUS
INTRAVENOUS | Status: DC | PRN
  Administered 2011-12-27: 130 mg via INTRAVENOUS

## 2011-12-27 MED ORDER — OXYCODONE HCL 5 MG PO TABS: 5.0000 mg | ORAL_TABLET | Freq: Once | ORAL | Status: DC | PRN

## 2011-12-27 MED ORDER — HYDROMORPHONE HCL PF 1 MG/ML IJ SOLN: 0.2500 mg | INTRAMUSCULAR | Status: DC | PRN

## 2011-12-27 MED ORDER — RIVAROXABAN 15 MG PO TABS: 15.0000 mg | ORAL_TABLET | Freq: Every day | ORAL | Status: DC

## 2011-12-27 MED ORDER — OXYCODONE HCL 5 MG/5ML PO SOLN: 5.0000 mg | Freq: Once | ORAL | Status: DC | PRN

## 2011-12-27 MED ORDER — FUROSEMIDE 80 MG PO TABS: 40.0000 mg | ORAL_TABLET | Freq: Two times a day (BID) | ORAL | Status: DC

## 2011-12-27 MED ORDER — PHENYLEPHRINE HCL 10 MG/ML IJ SOLN
INTRAMUSCULAR | Status: DC | PRN
  Administered 2011-12-27: 80 ug via INTRAVENOUS

## 2011-12-27 MED ORDER — CEFAZOLIN SODIUM-DEXTROSE 2-3 GM-% IV SOLR: 2.0000 g | Freq: Three times a day (TID) | INTRAVENOUS | Status: AC

## 2011-12-27 MED ORDER — SODIUM CHLORIDE 0.9 % IV SOLN
INTRAVENOUS | Status: DC | PRN
  Administered 2011-12-27: 14:00:00 via INTRAVENOUS

## 2011-12-27 MED ORDER — DILTIAZEM HCL ER COATED BEADS 240 MG PO TB24: 240.0000 mg | ORAL_TABLET | Freq: Every day | ORAL | Status: DC

## 2011-12-27 MED ORDER — DROPERIDOL 2.5 MG/ML IJ SOLN: 0.6250 mg | INTRAMUSCULAR | Status: DC | PRN

## 2011-12-27 MED ORDER — SUCCINYLCHOLINE CHLORIDE 20 MG/ML IJ SOLN
INTRAMUSCULAR | Status: DC | PRN
  Administered 2011-12-27: 100 mg via INTRAVENOUS

## 2011-12-27 MED ORDER — INSULIN ASPART 100 UNIT/ML ~~LOC~~ SOLN: 0.0000 [IU] | Freq: Three times a day (TID) | SUBCUTANEOUS | Status: DC

## 2011-12-27 MED ORDER — LIDOCAINE HCL (CARDIAC) 20 MG/ML IV SOLN
INTRAVENOUS | Status: DC | PRN
  Administered 2011-12-27: 30 mg via INTRAVENOUS

## 2011-12-27 MED ORDER — SODIUM CHLORIDE 0.9 % IV SOLN
INTRAVENOUS | Status: DC
  Administered 2011-12-27: 14:00:00 via INTRAVENOUS

## 2011-12-27 MED ORDER — DILTIAZEM HCL ER COATED BEADS 240 MG PO CP24
240.0000 mg | ORAL_CAPSULE | Freq: Every day | ORAL | Status: DC
  Administered 2011-12-27: 240 mg via ORAL
  Filled 2011-12-27: qty 1

## 2011-12-27 MED ORDER — INSULIN NPH ISOPHANE & REGULAR (70-30) 100 UNIT/ML ~~LOC~~ SUSP: 10.0000 [IU] | Freq: Two times a day (BID) | SUBCUTANEOUS | Status: DC

## 2011-12-27 MED ORDER — CARVEDILOL 12.5 MG PO TABS: 25.0000 mg | ORAL_TABLET | Freq: Two times a day (BID) | ORAL | Status: DC

## 2011-12-27 NOTE — Progress Notes (Signed)
[facility] PROGRESS NOTE  Vincent [name]:[id number] DOB: [birth year] DOA: 12/18/2011 PCP: [name], MD  Assessment/Plan:  Acute on chronic systolic CHF exacerbation- -Remains clinically slightly overloaded -continue furosemide, change to PO to 40 mg every 12, due to worsening CKD -Hold on ACE inhibitor at this time due to his renal insufficiency  -continue carvedilol to 12.5 mg twice a day  -neg 4.8 cc for the admission  - appreciate Dr.Hilty's assistance  Cardiorenal syndrome / CKD stage III : -creatinine slowly climbing, cut down diuretics Baseline creatinine 1.96 in 1012  Bacteremia-coagulase negative Staphylococcus  -This is likely the source of the patient's CHF decompensation  -Patient denies any prosthetic devices or plates or screws -likely source are skin ulcerations on his legs -Continue cefazolin IV- -Today is day 8 of antibiotics total -will request ID eval for duration of antibiotics -Repeat blood cultures(9/7) negative so far, 2D echocardiogram negative for vegetations -TEE today to r/o endocarditis per ID recs   Paroxysmal Atrial fibrillation with rapid ventricular response  - was initially on cardizem gtt -Rate up, on carvedilol, increase cardizem to 60mg  Q6, if HR more stable by tomorrow will change to cardizem CD  -Echocardiogram shows EF 45-50%, TSH 2.090  - continue warfarin per pharmacy dosing   Diabetes mellitus type 2, insulin-dependent  -Hemoglobin A1c 8.8, LDL 70  -stable, continue 81/19 insulin  -Monitor CBG, gradually increase insulin as needed   Medical noncompliance  -Dr.Tat had conversation with the patient's daughter, it appears that the patient has difficulty with following up his outpatient appointments as well as taking his medications properly   Deconditioning  -PT evaluation, ? LTAC depending on duration of IV abx  Constipation  -Continue Senokot  -Improved  Antibiotics:  Vancomycin September 5>>> September8    Cefazolin September 8>>>   Code Status: Full  Family Communication: Daughter Annabelle Harman) [phone] , June [phone] Disposition Plan: SNF when medically stable    Procedures/Studies: Dg Chest Port 1 View  12/21/2011  *RADIOLOGY REPORT*  Clinical Data: 76 year old male with congestive heart failure. Shortness of breath.  PORTABLE CHEST - 1 VIEW  Comparison: 12/18/2011 and earlier.  Findings: AP portable semi upright view 1804 hours.  Stable lung volumes.  Stable cardiac size and mediastinal contours.  No pneumothorax.  No large effusion.  Retrocardiac streaky opacity compatible with atelectasis.  No overt pulmonary edema.  Chronic right lateral rib fractures.  IMPRESSION: Retrocardiac atelectasis, otherwise no acute cardiopulmonary abnormality.   Original Report Authenticated By: Harley Hallmark, M.D.    Dg Chest Port 1 View  12/18/2011  *RADIOLOGY REPORT*  Clinical Data: Atrial fibrillation.  High blood pressure. Diabetes. Shortness of breath.  PORTABLE CHEST - 1 VIEW  Comparison: 03/02/2011.  Findings: Remote right rib fractures.  Granuloma right upper lobe unchanged since 2008.  Cardiomegaly.  Pulmonary vascular prominence most notable centrally.  No segmental consolidation or gross pneumothorax.  The patient would eventually benefit from follow-up two-view chest with cardiac leads removed.  IMPRESSION: Cardiomegaly.  Pulmonary vascular prominence most notable centrally.  Calcified slightly tortuous aorta.  Stable granuloma right upper lobe.   Original Report Authenticated By: Fuller Canada, M.D.        Subjective: Patient is feeling better. He denies any chest pain, shortness breath, nausea, vomiting, diarrhea, abdominal pain, rashes, dizziness. Unsure about TEE  Objective: Filed Vitals:   12/27/11 0507 12/27/11 1211 12/27/11 1515 12/27/11 1600  BP: 145/88 140/82    Pulse: 116     Temp: 98.5 F (36.9 C)  97.8 F (36.6 C) 97.5 F (36.4 C)  TempSrc: Oral     Resp: 18     Height:       Weight: 118.4 kg (261 lb 0.4 oz)     SpO2: 100%       Intake/Output Summary (Last 24 hours) at 12/27/11 1608 Last data filed at 12/27/11 1515  Gross per 24 hour  Intake    520 ml  Output   1076 ml  Net   -556 ml   Weight change: 7.949 kg (17 lb 8.4 oz) Exam:   General:  Pt is alert, follows commands appropriately, not in acute distress  HEENT: No icterus, No thrush, No neck mass, [place]/AT  Cardiovascular: Irregular rate and rhythm, S1/S2, no rubs, no gallops  Respiratory: Basilar crackles left greater than right, no wheezing, no crackles, no rhonchi  Abdomen: Soft, non tender, non distended, bowel sounds present, no guarding  Extremities: 1+ edema, No lymphangitis, No petechiae, No rashes, no synovitis  Left leg ulcers  Data Reviewed: Basic Metabolic Panel:  Lab 12/27/11 8469 12/26/11 0515 12/24/11 0600 12/23/11 0606 12/22/11 0600  NA 136 136 135 134* 138  K 3.6 3.8 3.9 4.1 3.6  CL 96 95* 96 94* 98  CO2 31 32 29 29 29   GLUCOSE 255* 238* 118* 164* 52*  BUN 47* 46* 41* 37* 30*  CREATININE 2.09* 2.30* 2.09* 1.98* 2.16*  CALCIUM 9.1 9.5 9.0 9.1 9.5  MG -- -- -- -- 2.1  PHOS -- -- -- -- --   Liver Function Tests: No results found for this basename: AST:5,ALT:5,ALKPHOS:5,BILITOT:5,PROT:5,ALBUMIN:5 in the last 168 hours No results found for this basename: LIPASE:5,AMYLASE:5 in the last 168 hours No results found for this basename: AMMONIA:5 in the last 168 hours CBC:  Lab 12/24/11 0600 12/23/11 0606 12/22/11 0600 12/21/11 0550  WBC 10.7* 11.8* 16.6* 11.2*  NEUTROABS -- 9.3* -- --  HGB 9.9* 10.3* 11.1* 10.9*  HCT 29.9* 31.7* 34.4* 33.4*  MCV 95.5 96.4 96.6 97.1  PLT 241 216 283 194   Cardiac Enzymes: No results found for this basename: CKTOTAL:5,CKMB:5,CKMBINDEX:5,TROPONINI:5 in the last 168 hours BNP: No components found with this basename: POCBNP:5 CBG:  Lab 12/27/11 0611 12/26/11 2110 12/26/11 1628 12/26/11 1131 12/26/11 0710  GLUCAP 234* 252* 195* 199* 193*     Recent Results (from the past 240 hour(s))  URINE CULTURE     Status: Normal   Collection Time   12/18/11 10:38 AM      Component Value Range Status Comment   Specimen Description URINE, CLEAN CATCH   Final    Special Requests NONE   Final    Culture  Setup Time 12/18/2011 11:50   Final    Colony Count 3,000 COLONIES/ML   Final    Culture INSIGNIFICANT GROWTH   Final    Report Status 12/19/2011 FINAL   Final   CULTURE, BLOOD (ROUTINE X 2)     Status: Normal   Collection Time   12/18/11  4:35 PM      Component Value Range Status Comment   Specimen Description BLOOD LEFT HAND   Final    Special Requests BOTTLES DRAWN AEROBIC ONLY 7CC   Final    Culture  Setup Time 12/18/2011 22:56   Final    Culture     Final    Value: STAPHYLOCOCCUS SPECIES (COAGULASE NEGATIVE)     Note: RIFAMPIN AND GENTAMICIN SHOULD NOT BE USED AS SINGLE DRUGS FOR TREATMENT OF STAPH INFECTIONS.  5 Note: Gram Stain Report Called to,Read Back By and Verified With: NURSE RASHIDA HANEY AT 6:13PM 9 2013 BY THOMI   Report Status 12/22/2011 FINAL   Final    Organism ID, Bacteria STAPHYLOCOCCUS SPECIES (COAGULASE NEGATIVE)   Final   CULTURE, BLOOD (ROUTINE X 2)     Status: Normal   Collection Time   12/18/11  4:45 PM      Component Value Range Status Comment   Specimen Description BLOOD LEFT HAND   Final    Special Requests BOTTLES DRAWN AEROBIC ONLY 5CC   Final    Culture  Setup Time 12/18/2011 22:57   Final    Culture     Final    Value: STAPHYLOCOCCUS SPECIES (COAGULASE NEGATIVE)     Note: SUSCEPTIBILITIES PERFORMED ON PREVIOUS CULTURE WITHIN THE LAST 5 DAYS.     Note: Gram Stain Report Called to,Read Back By and Verified With: COURTNEY GENTRY 12/19/11 1410 BY SMITJ   Report Status 12/22/2011 FINAL   Final   CULTURE, BLOOD (ROUTINE X 2)     Status: Normal   Collection Time   12/21/11  5:50 AM      Component Value Range Status Comment   Specimen Description BLOOD RIGHT ARM   Final    Special Requests BOTTLES DRAWN  AEROBIC AND ANAEROBIC 10CC EACH   Final    Culture  Setup Time 12/21/2011 10:12   Final    Culture NO GROWTH 5 DAYS   Final    Report Status 12/27/2011 FINAL   Final   CULTURE, BLOOD (ROUTINE X 2)     Status: Normal   Collection Time   12/21/11  6:05 AM      Component Value Range Status Comment   Specimen Description BLOOD LEFT HAND   Final    Special Requests BOTTLES DRAWN AEROBIC AND ANAEROBIC 10CC EACH   Final    Culture  Setup Time 12/21/2011 10:12   Final    Culture NO GROWTH 5 DAYS   Final    Report Status 12/27/2011 FINAL   Final   URINE CULTURE     Status: Normal   Collection Time   12/22/11  3:38 PM      Component Value Range Status Comment   Specimen Description URINE, CATHETERIZED   Final    Special Requests NONE   Final    Culture  Setup Time 12/22/2011 23:27   Final    Colony Count NO GROWTH   Final    Culture NO GROWTH   Final    Report Status 12/24/2011 FINAL   Final      Scheduled Meds:    . allopurinol  150 mg Oral Daily  . carvedilol  25 mg Oral BID WC  .  ceFAZolin (ANCEF) IV  2 g Intravenous Q8H  . diltiazem  60 mg Oral Q6H  . furosemide  40 mg Oral BID  . hydrocortisone  20 mg Oral BID  . insulin aspart  0-20 Units Subcutaneous TID WC  . insulin aspart  0-5 Units Subcutaneous QHS  . insulin aspart protamine-insulin aspart  10 Units Subcutaneous Q breakfast  . insulin aspart protamine-insulin aspart  10 Units Subcutaneous Q supper  . neomycin-bacitracin-polymyxin   Topical Daily  . senna-docusate  1 tablet Oral BID  . sodium chloride  3 mL Intravenous Q12H  . warfarin  2.5 mg Oral ONCE-1800  . Warfarin - Pharmacist Dosing Inpatient   Does not apply q1800   Continuous Infusions:    .  sodium chloride 20 mL/hr at 12/27/11 1347     Rayaan Garguilo, MD  [facility] Pager [phone]  If 7PM-7AM, please contact night-coverage [email or web address] Password [email or web address] 12/27/2011, 4:08 PM   LOS: 9 days

## 2011-12-27 NOTE — Progress Notes (Signed)
Pt moved to [id number] per w/c accompanied by Nurse tech. Marisa Cyphers RN

## 2011-12-27 NOTE — Progress Notes (Addendum)
INFECTIOUS DISEASE PROGRESS NOTE  ID: Vincent [name] is a 76 y.o. male with   Principal Problem:  *Acute on chronic diastolic CHF (congestive heart failure) Active Problems:  Hypertension  Irregular heartbeat  Prostate cancer  Atrial fibrillation with rapid ventricular response, history of PAF was in SR in 08/2011  DM2 (diabetes mellitus, type 2)  Leukocytosis  Bacteremia  CKD (chronic kidney disease) stage 3, GFR 30-59 ml/min  Subjective: Without complaints, no SOB  Abtx:  Anti-infectives     Start     Dose/Rate Route Frequency Ordered Stop   12/22/11 2200   ceFAZolin (ANCEF) IVPB 2 g/50 mL premix        2 g 100 mL/hr over 30 Minutes Intravenous 3 times per day 12/22/11 1540     12/22/11 1500   ceFAZolin (ANCEF) IVPB 1 g/50 mL premix  Status:  Discontinued        1 g 100 mL/hr over 30 Minutes Intravenous 3 times per day 12/22/11 1450 12/22/11 1540   12/20/11 1600   vancomycin (VANCOCIN) 1,500 mg in sodium chloride 0.9 % 500 mL IVPB  Status:  Discontinued        1,500 mg 250 mL/hr over 120 Minutes Intravenous Every 24 hours 12/19/11 1506 12/22/11 1450   12/20/11 1600   ketoconazole (NIZORAL) tablet 400 mg  Status:  Discontinued        400 mg Oral 3 times daily 12/20/11 1350 12/25/11 0857   12/19/11 1600   vancomycin (VANCOCIN) 2,500 mg in sodium chloride 0.9 % 500 mL IVPB        2,500 mg 250 mL/hr over 120 Minutes Intravenous  Once 12/19/11 1506 12/19/11 1806   12/18/11 1645   ketoconazole (NIZORAL) tablet 200 mg  Status:  Discontinued        200 mg Oral Daily 12/18/11 1631 12/20/11 1350          Medications:  Scheduled:   . allopurinol  150 mg Oral Daily  . carvedilol  25 mg Oral BID WC  .  ceFAZolin (ANCEF) IV  2 g Intravenous Q8H  . diltiazem  60 mg Oral Q6H  . furosemide  40 mg Oral BID  . hydrocortisone  20 mg Oral BID  . insulin aspart  0-20 Units Subcutaneous TID WC  . insulin aspart  0-5 Units Subcutaneous QHS  . insulin aspart protamine-insulin  aspart  10 Units Subcutaneous Q breakfast  . insulin aspart protamine-insulin aspart  10 Units Subcutaneous Q supper  . neomycin-bacitracin-polymyxin   Topical Daily  . senna-docusate  1 tablet Oral BID  . sodium chloride  3 mL Intravenous Q12H  . warfarin  2.5 mg Oral ONCE-1800  . Warfarin - Pharmacist Dosing Inpatient   Does not apply q1800    Objective: Vital signs in last 24 hours: Temp:  [98 F (36.7 C)-98.5 F (36.9 C)] 98.5 F (36.9 C) (09/13 0507) Pulse Rate:  [69-123] 116  (09/13 0507) Resp:  [18-20] 18  (09/13 0507) BP: (116-145)/(59-88) 145/88 mmHg (09/13 0507) SpO2:  [100 %] 100 % (09/13 0507) Weight:  [118.4 kg (261 lb 0.4 oz)] 118.4 kg (261 lb 0.4 oz) (09/13 0507)   Resp: clear to auscultation bilaterally Cardio: irregularly irregular rhythm GI: normal findings: bowel sounds normal and soft, non-tender Extremities: ulcers on LLE, clean, no d/c, superficial.  Exposed root on R maxilla  Lab Results  Basename 12/27/11 0415 12/26/11 0515  WBC -- --  HGB -- --  HCT -- --  NA  136 136  K 3.6 3.8  CL 96 95*  CO2 31 32  BUN 47* 46*  CREATININE 2.09* 2.30*  GLU -- --   Liver Panel No results found for this basename: PROT:2,ALBUMIN:2,AST:2,ALT:2,ALKPHOS:2,BILITOT:2,BILIDIR:2,IBILI:2 in the last 72 hours Sedimentation Rate No results found for this basename: ESRSEDRATE in the last 72 hours C-Reactive Protein No results found for this basename: CRP:2 in the last 72 hours  Microbiology: Recent Results (from the past 240 hour(s))  URINE CULTURE     Status: Normal   Collection Time   12/18/11 10:38 AM      Component Value Range Status Comment   Specimen Description URINE, CLEAN CATCH   Final    Special Requests NONE   Final    Culture  Setup Time 12/18/2011 11:50   Final    Colony Count 3,000 COLONIES/ML   Final    Culture INSIGNIFICANT GROWTH   Final    Report Status 12/19/2011 FINAL   Final   CULTURE, BLOOD (ROUTINE X 2)     Status: Normal   Collection Time    12/18/11  4:35 PM      Component Value Range Status Comment   Specimen Description BLOOD LEFT HAND   Final    Special Requests BOTTLES DRAWN AEROBIC ONLY 7CC   Final    Culture  Setup Time 12/18/2011 22:56   Final    Culture     Final    Value: STAPHYLOCOCCUS SPECIES (COAGULASE NEGATIVE)     Note: RIFAMPIN AND GENTAMICIN SHOULD NOT BE USED AS SINGLE DRUGS FOR TREATMENT OF STAPH INFECTIONS.     5 Note: Gram Stain Report Called to,Read Back By and Verified With: NURSE RASHIDA HANEY AT 6:13PM 9 2013 BY THOMI   Report Status 12/22/2011 FINAL   Final    Organism ID, Bacteria STAPHYLOCOCCUS SPECIES (COAGULASE NEGATIVE)   Final   CULTURE, BLOOD (ROUTINE X 2)     Status: Normal   Collection Time   12/18/11  4:45 PM      Component Value Range Status Comment   Specimen Description BLOOD LEFT HAND   Final    Special Requests BOTTLES DRAWN AEROBIC ONLY 5CC   Final    Culture  Setup Time 12/18/2011 22:57   Final    Culture     Final    Value: STAPHYLOCOCCUS SPECIES (COAGULASE NEGATIVE)     Note: SUSCEPTIBILITIES PERFORMED ON PREVIOUS CULTURE WITHIN THE LAST 5 DAYS.     Note: Gram Stain Report Called to,Read Back By and Verified With: COURTNEY GENTRY 12/19/11 1410 BY SMITJ   Report Status 12/22/2011 FINAL   Final   CULTURE, BLOOD (ROUTINE X 2)     Status: Normal   Collection Time   12/21/11  5:50 AM      Component Value Range Status Comment   Specimen Description BLOOD RIGHT ARM   Final    Special Requests BOTTLES DRAWN AEROBIC AND ANAEROBIC 10CC EACH   Final    Culture  Setup Time 12/21/2011 10:12   Final    Culture NO GROWTH 5 DAYS   Final    Report Status 12/27/2011 FINAL   Final   CULTURE, BLOOD (ROUTINE X 2)     Status: Normal   Collection Time   12/21/11  6:05 AM      Component Value Range Status Comment   Specimen Description BLOOD LEFT HAND   Final    Special Requests BOTTLES DRAWN AEROBIC AND ANAEROBIC 10CC EACH   Final  Culture  Setup Time 12/21/2011 10:12   Final    Culture NO GROWTH 5  DAYS   Final    Report Status 12/27/2011 FINAL   Final   URINE CULTURE     Status: Normal   Collection Time   12/22/11  3:38 PM      Component Value Range Status Comment   Specimen Description URINE, CATHETERIZED   Final    Special Requests NONE   Final    Culture  Setup Time 12/22/2011 23:27   Final    Colony Count NO GROWTH   Final    Culture NO GROWTH   Final    Report Status 12/24/2011 FINAL   Final     Studies/Results: No results found.   Assessment/Plan: MSSE Bacteremia CHF ESRD (stage III) Cellulitis  Total days of antibiotics 9    ancef    Day 6         Await TEE My appreciation to Dr Rennis Golden for his eval.  Duration of anbx based on tee findings.   [name] Infectious Diseases [phone] 12/27/2011, 10:38 AM   LOS: 9 days   Spoke with Dr Jomarie Longs TEE (-).  5 more days of anbx Repeat BCx 2 weeks off anbx  Please call if questions

## 2011-12-27 NOTE — Progress Notes (Signed)
Pt. Seen and examined. Agree with the NP/PA-C note as written. No sequelae of endocarditis on physical exam, echo shows mildly decreased LVEF and no valvular regurgitation and no evidence for valvular mass, however MSSA bacteremia is present, likely from leg ulcers. Seems to be responding to antibiotics. Heart failure is improving with diuresis. I highly doubt significant endocarditis, however, it is reasonable to consider TEE given the subtherapeutic INR to support cardioversion. With his reduced EF and heart failure, he would benefit from establishing sinus rhythm. He has reservations about TEE, though - and wants to be with his wife as she is getting dialysis today. Another option would be antibiotic therapy for 2 weeks and re-check of blood cultures at that time. If they have cleared, he would not likely need a full 6 week course of therapy. We could then monitor INR for therapeutic range for >1 month and proceed with cardioversion if he is still in a-fib.  He is considering options.  We have scheduled him tentatively for TEE/Cardioversion in OR 5 at 1:30, given his obesity, thick neck and higher risk for procedural complications.  Chrystie Nose, MD, [facility] Attending Cardiologist [facility]

## 2011-12-27 NOTE — Progress Notes (Signed)
Pt does not wish to sign permit for TEE with possible Cardioversion. Let Dr Jomarie Longs know and pt asked [place] to call his daughter to help. Dr Herbie Baltimore had seen pt 9/12 6 pm and explained all procedure to pt and talked with him about why it should be done.

## 2011-12-27 NOTE — Discharge Summary (Signed)
Physician Discharge Summary  Patient ID: Vincent [name] MRN: [id number] DOB/AGE: [birth year] 76 y.o.  Admit date: 12/18/2011 Discharge date: 12/27/2011  Primary Care Physician:  [name], MD  For follow up: 1. Check INR daily, when <2 start Xarelto 15mg  daily (being transitioned from coumadin to xarelto) 2. Follow up with PCP Dr.Clark in 1 week 3. FU with Dr.Hilty in 2-3 weeks 4. FU with [facility] in 2-3 week, new patient follow up, I called and gave information   Discharge Diagnoses:   Principal Problem:  1.Acute on chronic diastolic and systolic CHF (congestive heart failure) EF of 45%  2. Left atrial thrombus  3. Afib with RVR  4. Coag negative staph bacteremia, TEE negative  5. Leg ulcers, ruptured blisters  6. Hypertension  7.  Prostate cancer on Hydrocortisone and Ketoconazole per Urologist  8.  DM2 (diabetes mellitus, type 2)  9.  Leukocytosis 10. CKD (chronic kidney disease) stage 3, GFR 30-59 ml/min 11. Obesity 12. Gout    Medication List     As of 12/27/2011  4:43 PM    STOP taking these medications         warfarin 2.5 MG tablet   Commonly known as: COUMADIN      TAKE these medications         allopurinol 300 MG tablet   Commonly known as: ZYLOPRIM   Take 300 mg by mouth daily.      carvedilol 12.5 MG tablet   Commonly known as: COREG   Take 2 tablets (25 mg total) by mouth 2 (two) times daily with a meal.      ceFAZolin 2-3 GM-% Solr   Commonly known as: ANCEF   Inject 50 mLs (2 g total) into the vein every 8 (eight) hours. For 5 more days      diltiazem 240 MG 24 hr tablet   Commonly known as: CARDIZEM LA   Take 1 tablet (240 mg total) by mouth daily.      furosemide 80 MG tablet   Commonly known as: LASIX   Take 0.5 tablets (40 mg total) by mouth 2 (two) times daily.      hydrocortisone 20 MG tablet   Commonly known as: CORTEF   Take 20 mg by mouth 2 (two) times daily.      insulin aspart 100 UNIT/ML injection   Commonly known as: novoLOG   Inject 0-20 Units into the skin 3 (three) times daily with meals.      insulin NPH-insulin regular (70-30) 100 UNIT/ML injection   Commonly known as: NOVOLIN 70/30   Inject 10 Units into the skin 2 (two) times daily with a meal.      ketoconazole 200 MG tablet   Commonly known as: NIZORAL   Take 400 mg by mouth 3 (three) times daily.      Rivaroxaban 15 MG Tabs tablet   Commonly known as: XARELTO   Take 1 tablet (15 mg total) by mouth daily. To start then INR <2       Consults:  1. Dr.Hilty, [facility] 2. Dr.Hatcher, ID  Significant Diagnostic Studies:  PORTABLE CHEST - 1 VIEW  IMPRESSION:  Cardiomegaly.  Pulmonary vascular prominence most notable centrally.  Calcified slightly tortuous aorta.  Stable granuloma right upper lobe.   2D echo: 9/4 Study Conclusions - Left ventricle: The cavity size was normal. Wall thickness was increased in a pattern of mild LVH. Systolic function was mildly reduced. The estimated ejection fraction was in the range  of 45% to 50%. Atrial fibrillation and technical difficulties prevent evaluation of LV diastolic function. - Left atrium: The atrium was mildly dilated  TEE: 9/13 Study Conclusions - Left ventricle: There was mild concentric hypertrophy. Systolic function was mildly to moderately reduced. The estimated ejection fraction was in the range of 40% to 45%. Diffuse hypokinesis. - Aortic valve: No evidence of vegetation. No regurgitation. - Aorta: The aorta was normal, not dilated, and non-diseased. - Mitral valve: No evidence of vegetation. Trivial regurgitation. - Left atrium: Moderate to severely dilated. Spontaneous echo contrast ("smoke") is seen, suggestive of a low flow state. There is a uniobular appendage with an obvious soft thrombus which is seen protruding in and out of the left atrium. There is almost no flow by color doppler and pulse doppler velocity in the LAA is near zero. - Right atrium:  The atrium was dilated. No evidence of thrombus in the appendage. - Atrial septum: No defect or patent foramen ovale was identified. - Tricuspid valve: Mildly thickened leaflets. Trivial regurgitation. No vegetation. - Pulmonic valve: No evidence of vegetation.    Brief H and P: This is a 76 year old male who presents with a one-week history of increasing shortness of breath and worsening bilateral lower extremity edema. The patient states that he has noted increasing lower extremity edema to the point where he is unable to lift his legs because they feel some heavy. Of note, the patient states that he is supposed to take Lasix 80 mg twice a day, but he states that he only takes it once daily. In addition, he seems to have very poor insight on his medications. In fact, the patient states that the only reason he can to the hospital was because his wife urged him to do so because he was having difficulty lifting his legs. He denies any other focal extremity weakness, syncope, dizziness, visual changes, dysarthria.  The patient states that the past 24 hours he has had to sleep sitting up in a chair because of his dyspnea. He denies any chest pain, hemoptysis, fevers, chills, nausea, vomiting, diarrhea. The patient was given 80 mg of intravenous Lasix in the emergency department from which the patient states that he is 50% better with regard to his breathing. In addition the patient was found to have a fibrillation with a rapid ventricular response of 150 in the emergency department. He was given Cardizem 20 mg IV x1 followed by a Cardizem drip at 10 mg hour. He remained hemodynamically stable at this time. Echocardiogram on every 12 2009 showed ejection fraction of 50%.  Hospital Course:   1. Acute on chronic systolic CHF exacerbation- EF of 40-45%, based on TTE and TEE this admission -diuresed with IV lasix aggressively  -continue furosemide, change to PO to 40 mg every 12, due to worsening CKD,  creatinine stable at 2.0 now -Hold on ACE inhibitor due to his renal insufficiency  -continue carvedilol to 12.5 mg twice a day  -neg 5.4L for the admission  -  Greatly appreciate Dr.Hilty's assistance   2. Cardiorenal syndrome / CKD stage III :  -creatinine rose to 2.3, improved to 2.0, with cutting down diuretics  Baseline creatinine 1.96 in 2012   3. Bacteremia-coagulase negative Staphylococcus  -This is likely the source of the patient's CHF decompensation  -Patient denies any prosthetic devices or plates or screws  -likely source are skin ulcerations on his legs  -Continue cefazolin IV- through peripheral IV -Appreciative of ID consultation per Dr.Hatcher -Repeat blood cultures(9/7)  negative so far, 2D echocardiogram negative for vegetations  And TEE 9/13 also negative for endocarditis - ID recommends total of 14 day course of ancef to be completed 9/18  4. Paroxysmal Atrial fibrillation with rapid ventricular response  - was initially on cardizem gtt  -Rate up, on carvedilol, increasedcardizem to 60mg  Q6, being transitioned to cardizem CD 240mg  -Echocardiogram shows EF 45-50%, TSH 2.090  - TEE also noted LA thrombus, and since his INR was difficult to regulate and manage on Coumadin, Dr.Hilty recommended to switch him to Xarelto at this point, he receved coumadin last night and INR if 3.1 today, coumadin will be stopped hence forth and he will need daily INR checks for next few days and when INR <2, he can Start Xarelto 15mg  daily based on his GFR for Afib and LA thrombus  5. LA thrombus: noted on TEE 9/13, plan to transition from coumadin to xarelto as noted above  6. DM Insulin-dependent  -Hemoglobin A1c 8.8, LDL 70  -stable, continue 16/10 insulin  -Monitor CBG, gradually increase insulin as needed   7. Deconditioning  -followed and seen by Physical therapy needs ongoing PT  8. Constipation  -Continue Senokot  -Improved   Antibiotics:  Vancomycin September 5>>>  September8  Cefazolin 9/8>>>9/18     Time spent on Discharge:  Signed: Zannie Cove [facility] Pager: [phone] 12/27/2011, 4:43 PM

## 2011-12-27 NOTE — Progress Notes (Signed)
Inpatient Diabetes Program Recommendations  AACE/ADA: New Consensus Statement on Inpatient Glycemic Control (2013)  Target Ranges:  Prepandial:   less than 140 mg/dL      Peak postprandial:   less than 180 mg/dL (1-2 hours)      Critically ill patients:  140 - 180 mg/dL  Results for CEASER, EBELING (MRN [id number]) as of 12/27/2011 13:09  Ref. Range 12/26/2011 07:10 12/26/2011 11:31 12/26/2011 16:28 12/26/2011 21:10 12/27/2011 06:11  Glucose-Capillary Latest Range: 70-99 mg/dL 962 (H) 952 (H) 841 (H) 252 (H) 234 (H)   Inpatient Diabetes Program Recommendations Insulin - Basal: Increase 70/30 to 20 BID with breakfast and supper (not given if NPO)  Thank you  Piedad Climes [facility] Inpatient Diabetes Coordinator [phone]

## 2011-12-27 NOTE — CV Procedure (Signed)
[facility]  TRANSESOPHAGEAL ECHOCARDIOGRAM (TEE) NOTE   INDICATIONS: infective endocarditis and atrial fibrillation  PROCEDURE:   Informed consent was obtained prior to the procedure. The risks, benefits and alternatives for the procedure were discussed and the patient comprehended these risks.  Risks include, but are not limited to, cough, sore throat, vomiting, nausea, somnolence, esophageal and stomach trauma or perforation, bleeding, low blood pressure, aspiration, pneumonia, infection, trauma to the teeth and death.    The patient was brought to OR 5 for general anesthesia. Please see the anesthesia note for details.  After the patient was intubated and sedated, the TEE probe was passed without difficulty.   Agitated microbubble saline contrast was not administered.  The TEE probe was removed and the patient was allowed to recover with the anesthesia staff in the PACU.  COMPLICATIONS:    There were no immediate complications.  FINDINGS:  1. LEFT VENTRICLE: The left ventricle is mild to moderately hypertrophied with moderately reduced global systolic function, EF 40-45%.   2. RIGHT VENTRICLE:  The right ventricle is normal in structure and function without any thrombus or masses.    3. LEFT ATRIUM:  The left atrium is moderate to severely dilated with spontaneous echo contrast (" Smoke") which was noted.   4. LEFT ATRIAL APPENDAGE:  The left atrial appendage is unilobular.  There is a soft, gelatinous appearing thrombus noted in the LAA which prolapses in and out of the left atrium. Color doppler is difficult to detect in the appendage. Emptying velocities are near zero. The atrium was actively fibrillating.   5. ATRIAL SEPTUM:  The atrial septum is free of any thrombus or masses.  There is no evidence for interatrial shunting by color doppler. Saline microbubble contrast was not administered.  6. RIGHT ATRIUM:  The right atrium is dilated.  The  right atrial appendage is small and is without any thrombus or masses.  7. MITRAL VALVE:  The mitral valve is normal in structure and function with trivial regurgitation.  There were no vegetations noted nor any or stenosis.  8. AORTIC VALVE:  The aortic valve is normal in structure and function without regurgitation.  There were no vegetations or stenosis.  9. TRICUSPID VALVE:  The tricuspid valve is thickened, but only trivial regurgitation is noted.  There were no vegetations or stenosis.  10. PULMONIC VALVE:  The pulmonic valve is normal in structure and function without regurgitation.  There were no vegetations or stenosis.  11. AORTIC ARCH, ASCENDING AND DESCENDING AORTA:  There was no atherosclerosis of the descending aorta, ascending aorta or aortic arch.  IMPRESSION:   1. Moderately reduced systolic function with global hypokinesis, EF ~45%. 2. Moderate to severely dilated LA with spontaneous echo contrast, suggestive of a low flow state. 3. Soft thrombus is noted in the LAA with low emptying velocities on pulse doppler and near zero color doppler flow. 4. No evidence for valvular endocarditis.  RECOMMENDATIONS:    1. Complete the remainder of antibiotic therapy for 2 weeks per ID recommendations for MSSA bacteremia. 2. Recommend discontinuing warfarin (we have had difficulty maintaining therapeutic INR in the past) and switching to Xarelto 15 mg po daily (reduced dose for GFR of 30) for LAA thrombus.  Will need 4-6 weeks of anticoagulation and repeat TEE prior to considering a repeat cardioversion attempt for a-fib.  Time Spent Directly with the Patient:  60 minutes   Chrystie Nose, MD, [facility] Attending Cardiologist [facility]  12/27/2011, 3:03 PM

## 2011-12-27 NOTE — Interval H&P Note (Signed)
History and Physical Interval Note:  12/27/2011 1:39 PM  Vincent [name]  has presented today for surgery, with the diagnosis of afib/flutter and possible endocarditis.  The various methods of treatment have been discussed with the patient and family. After consideration of risks, benefits and other options for treatment, the patient has consented to  Procedure(s) (LRB) with comments: TRANSESOPHAGEAL ECHOCARDIOGRAM (TEE) (N/A) - MD request Main OR CARDIOVERSION (N/A) as a surgical intervention .  The patient's history has been reviewed, patient examined, no change in status, stable for surgery.  I have reviewed the patient's chart and labs.  Questions were answered to the patient's satisfaction.     Elberta Leatherwood, MD, [facility] Attending Cardiologist [facility]

## 2011-12-27 NOTE — Progress Notes (Signed)
Clinical Social Worker will sign off for now as social work intervention is no longer needed. Please consult us again if new need arises.   Sadeel Fiddler, MSW, LCSWA [phone] 

## 2011-12-27 NOTE — Progress Notes (Signed)
Called report to [facility] to [facility]. Pt's new room number will be [id number]. Marisa Cyphers RN

## 2011-12-27 NOTE — Progress Notes (Signed)
Subjective: No complaints, feels well, would like to go home and come back next week for TEE  Objective: Vital signs in last 24 hours: Temp:  [98 F (36.7 C)-98.5 F (36.9 C)] 98.5 F (36.9 C) (09/13 0507) Pulse Rate:  [69-123] 116  (09/13 0507) Resp:  [18-20] 18  (09/13 0507) BP: (116-145)/(59-88) 145/88 mmHg (09/13 0507) SpO2:  [100 %] 100 % (09/13 0507) Weight:  [118.4 kg (261 lb 0.4 oz)] 118.4 kg (261 lb 0.4 oz) (09/13 0507) Weight change: 7.949 kg (17 lb 8.4 oz) Last BM Date: 12/26/11 Intake/Output from previous day: -566  Wts do not seem to be accurate.  Now 118.4Kg this is more correct than the 110 yesterday. 09/12 0701 - 09/13 0700 In: 1060 [P.O.:1060] Out: 1626 [Urine:1625; Stool:1] Intake/Output this shift:    PE: General:alert and oriented, MAE Heart:S1S2 Irreg irreg, continues a. flutter Lungs:clear without rales, rhonchi or wheezes Abd:+ BS, soft, non tender Ext: 1+ edema of lower ext, mid calf down    Lab Results: No results found for this basename: WBC:2,HGB:2,HCT:2,PLT:2 in the last 72 hours BMET  [facility] 12/27/11 0415 12/26/11 0515  NA 136 136  K 3.6 3.8  CL 96 95*  CO2 31 32  GLUCOSE 255* 238*  BUN 47* 46*  CREATININE 2.09* 2.30*  CALCIUM 9.1 9.5   No results found for this basename: TROPONINI:2,CK,MB:2 in the last 72 hours  Lab Results  Component Value Date   CHOL 126 12/19/2011   HDL 26* 12/19/2011   LDLCALC 70 12/19/2011   TRIG 148 12/19/2011   CHOLHDL 4.8 12/19/2011   Lab Results  Component Value Date   HGBA1C 8.8* 12/18/2011     Lab Results  Component Value Date   TSH 2.090 12/18/2011    Hepatic Function Panel No results found for this basename: PROT,ALBUMIN,AST,ALT,ALKPHOS,BILITOT,BILIDIR,IBILI in the last 72 hours No results found for this basename: CHOL in the last 72 hours No results found for this basename: PROTIME in the last 72 hours    EKG: Orders placed during the hospital encounter of 12/18/11  . EKG 12-LEAD  . EKG 12-LEAD    . EKG 12-LEAD  . EKG 12-LEAD  . EKG 12-LEAD  . EKG 12-LEAD  . EKG 12-LEAD  . EKG 12-LEAD  . EKG    Studies/Results: No results found.  Medications: I have reviewed the patient's current medications.    Marland Kitchen allopurinol  150 mg Oral Daily  . carvedilol  25 mg Oral BID WC  .  ceFAZolin (ANCEF) IV  2 g Intravenous Q8H  . diltiazem  60 mg Oral Q6H  . furosemide  40 mg Oral BID  . hydrocortisone  20 mg Oral BID  . insulin aspart  0-20 Units Subcutaneous TID WC  . insulin aspart  0-5 Units Subcutaneous QHS  . insulin aspart protamine-insulin aspart  10 Units Subcutaneous Q breakfast  . insulin aspart protamine-insulin aspart  10 Units Subcutaneous Q supper  . neomycin-bacitracin-polymyxin   Topical Daily  . senna-docusate  1 tablet Oral BID  . sodium chloride  3 mL Intravenous Q12H  . warfarin  2.5 mg Oral ONCE-1800  . Warfarin - Pharmacist Dosing Inpatient   Does not apply q1800  . DISCONTD: diltiazem  30 mg Oral Q6H  . DISCONTD: furosemide  80 mg Oral BID   Assessment/Plan: Principal Problem:  *Acute on chronic diastolic CHF (congestive heart failure) Active Problems:  Hypertension  Irregular heartbeat  Prostate cancer  Atrial fibrillation with rapid ventricular response, history of  PAF was in SR in 08/2011  DM2 (diabetes mellitus, type 2)  Leukocytosis  Bacteremia  CKD (chronic kidney disease) stage 3, GFR 30-59 ml/min  PLAN: Have discussed with Dr. Rennis Golden TEE for this pt.  Due to pt's body habitus, he believes pt should be intubated for procedure to maintain airway.  Arranging for today  OR 5 at 1330. INR at 3.16 Pro BNP 9/8 was 4724  On Admit 4560  May need increased diuresis, total fluid balance for admit -5460.  Pt would like to go home and come back Tuesday for TEE. A. Flutter continues, rate controlled with therapeutic INR.  LOS: 9 days   Amaad Byers R 12/27/2011, 8:04 AM

## 2011-12-27 NOTE — Anesthesia Postprocedure Evaluation (Signed)
Anesthesia Post Note  Patient: Vincent [name]  Procedure(s) Performed: Procedure(s) (LRB): TRANSESOPHAGEAL ECHOCARDIOGRAM (TEE) (N/A) CARDIOVERSION (N/A)  Anesthesia type: general  Patient location: PACU  Post pain: Pain level controlled  Post assessment: Patient's Cardiovascular Status Stable  Last Vitals:  Filed Vitals:   12/27/11 1515  BP:   Pulse:   Temp: 36.6 C  Resp:     Post vital signs: Reviewed and stable  Level of consciousness: sedated  Complications: No apparent anesthesia complications

## 2011-12-27 NOTE — Progress Notes (Signed)
Dr Rennis Golden here and spoke to Pt and and his daughter about TEE with possible cardioversion.

## 2011-12-27 NOTE — Progress Notes (Signed)
Dr Rennis Golden here again spoke with pt and his daughter and family per cell phone about procedure and expected outoomes.

## 2011-12-27 NOTE — Anesthesia Preprocedure Evaluation (Addendum)
Anesthesia Evaluation  Patient identified by MRN, date of birth, ID band Patient awake    Reviewed: Allergy & Precautions, H&P , NPO status , Patient's Chart, lab work & pertinent test results, Unable to perform ROS - Chart review only  History of Anesthesia Complications Negative for: history of anesthetic complications  Airway Mallampati: III TM Distance: >3 FB Neck ROM: Full    Dental  (+) Poor Dentition, Missing and Dental Advisory Given   Pulmonary shortness of breath and with exertion, pneumonia -, resolved,  breath sounds clear to auscultation  Pulmonary exam normal       Cardiovascular hypertension, Pt. on medications +CHF + dysrhythmias Atrial Fibrillation Rhythm:Regular Rate:Normal     Neuro/Psych    GI/Hepatic Neg liver ROS, GERD-  Medicated and Controlled,  Endo/Other    Renal/GU Renal InsufficiencyRenal disease     Musculoskeletal   Abdominal   Peds  Hematology   Anesthesia Other Findings   Reproductive/Obstetrics                          Anesthesia Physical Anesthesia Plan  ASA: III  Anesthesia Plan: General   Post-op Pain Management:    Induction: Intravenous  Airway Management Planned: Oral ETT  Additional Equipment:   Intra-op Plan:   Post-operative Plan: Extubation in OR  Informed Consent: I have reviewed the patients History and Physical, chart, labs and discussed the procedure including the risks, benefits and alternatives for the proposed anesthesia with the patient or authorized representative who has indicated his/her understanding and acceptance.   Dental advisory given  Plan Discussed with: CRNA, Anesthesiologist and Surgeon  Anesthesia Plan Comments:         Anesthesia Quick Evaluation

## 2011-12-27 NOTE — Progress Notes (Signed)
ANTICOAGULATION CONSULT NOTE - Follow Up Consult  Pharmacy Consult for Coumadin Indication: atrial fibrillation  No Known Allergies  Patient Measurements: Height: 5\' 9"  (175.3 cm) Weight: 261 lb 0.4 oz (118.4 kg) (scale C) IBW/kg (Calculated) : 70.7  Heparin Dosing Weight:   Vital Signs: Temp: 98.5 F (36.9 C) (09/13 0507) Temp src: Oral (09/13 0507) BP: 145/88 mmHg (09/13 0507) Pulse Rate: 116  (09/13 0507)  Labs:  Vincent [name] 12/27/11 0415 12/26/11 0515 12/25/11 0505  HGB -- -- --  HCT -- -- --  PLT -- -- --  APTT -- -- --  LABPROT 32.9* 30.9* 30.8*  INR 3.16* 2.91* 2.90*  HEPARINUNFRC -- -- --  CREATININE 2.09* 2.30* --  CKTOTAL -- -- --  CKMB -- -- --  TROPONINI -- -- --    Estimated Creatinine Clearance: 39.4 ml/min (by C-G formula based on Cr of 2.09).  Assessment: 76yom on Coumadin for Afib. INR (3.16) is just above goal level after trending up. Patient requiring smaller doses than PTA (5 mg daily EXCEPT for 2.5 mg on Mon/Fri). With INR continuing to increase, will plan to hold dose tonight and follow-up AM INR and Cardiology plans. - No CBC this AM - No significant bleeding reported  Goal of Therapy:  INR 2-3   Plan:  1. No Coumadin tonight 2. Follow-up AM INR and Cardiology plans  Vincent [name] [phone] 12/27/2011,9:25 AM

## 2011-12-27 NOTE — Transfer of Care (Signed)
Immediate Anesthesia Transfer of Care Note  Patient: Vincent [name]  Procedure(s) Performed: Procedure(s) (LRB) with comments: TRANSESOPHAGEAL ECHOCARDIOGRAM (TEE) (N/A) - MD request Main OR CARDIOVERSION (N/A)  Patient Location: PACU  Anesthesia Type: General  Level of Consciousness: awake and alert   Airway & Oxygen Therapy: Patient Spontanous Breathing and Patient connected to face mask oxygen  Post-op Assessment: Report given to PACU RN and Post -op Vital signs reviewed and stable  Post vital signs: Reviewed and stable  Complications: No apparent anesthesia complications

## 2011-12-27 NOTE — Progress Notes (Signed)
  Echocardiogram Echocardiogram Transesophageal has been performed.  Vincent [name] 12/27/2011, 3:16 PM

## 2011-12-27 NOTE — Progress Notes (Signed)
PT Cancellation Note  Treatment cancelled today due to patient receiving procedure or test.  Attempted to see patient x3 today - this am patient with physicians discussing medical options.  This pm, patient in OR having TEE procedure.  Will return at later date for continued PT services.  Vena Austria 12/27/2011, 1:43 PM [phone]

## 2011-12-28 ENCOUNTER — Other Ambulatory Visit (HOSPITAL_COMMUNITY): Payer: Self-pay

## 2011-12-28 LAB — CBC WITH DIFFERENTIAL/PLATELET
Eosinophils Absolute: 0.1 10*3/uL (ref 0.0–0.7)
Eosinophils Relative: 2 % (ref 0–5)
HCT: 31.6 % — ABNORMAL LOW (ref 39.0–52.0)
Hemoglobin: 10.2 g/dL — ABNORMAL LOW (ref 13.0–17.0)
Lymphs Abs: 1.6 10*3/uL (ref 0.7–4.0)
MCH: 30.8 pg (ref 26.0–34.0)
MCV: 95.5 fL (ref 78.0–100.0)
Monocytes Relative: 9 % (ref 3–12)
RBC: 3.31 MIL/uL — ABNORMAL LOW (ref 4.22–5.81)

## 2011-12-28 LAB — URINALYSIS, MICROSCOPIC ONLY
Bilirubin Urine: NEGATIVE
Specific Gravity, Urine: 1.011 (ref 1.005–1.030)
Urobilinogen, UA: 0.2 mg/dL (ref 0.0–1.0)

## 2011-12-28 LAB — MAGNESIUM: Magnesium: 2.1 mg/dL (ref 1.5–2.5)

## 2011-12-28 LAB — COMPREHENSIVE METABOLIC PANEL
Alkaline Phosphatase: 91 U/L (ref 39–117)
BUN: 40 mg/dL — ABNORMAL HIGH (ref 6–23)
Calcium: 9.1 mg/dL (ref 8.4–10.5)
GFR calc Af Amer: 43 mL/min — ABNORMAL LOW (ref >90)
Glucose, Bld: 228 mg/dL — ABNORMAL HIGH (ref 70–99)
Potassium: 3.3 mEq/L — ABNORMAL LOW (ref 3.5–5.1)
Total Protein: 7.8 g/dL (ref 6.0–8.3)

## 2011-12-30 LAB — PROTIME-INR
INR: 2.37 — ABNORMAL HIGH (ref 0.00–1.49)
Prothrombin Time: 26.3 seconds — ABNORMAL HIGH (ref 11.6–15.2)

## 2011-12-31 LAB — BASIC METABOLIC PANEL
BUN: 34 mg/dL — ABNORMAL HIGH (ref 6–23)
CO2: 34 mEq/L — ABNORMAL HIGH (ref 19–32)
Chloride: 92 mEq/L — ABNORMAL LOW (ref 96–112)
Creatinine, Ser: 1.52 mg/dL — ABNORMAL HIGH (ref 0.50–1.35)
Glucose, Bld: 210 mg/dL — ABNORMAL HIGH (ref 70–99)
Potassium: 2.9 mEq/L — ABNORMAL LOW (ref 3.5–5.1)
Sodium: 137 mEq/L (ref 135–145)

## 2011-12-31 LAB — CBC WITH DIFFERENTIAL/PLATELET
Basophils Absolute: 0 10*3/uL (ref 0.0–0.1)
Basophils Relative: 0 % (ref 0–1)
Eosinophils Absolute: 0.1 10*3/uL (ref 0.0–0.7)
Eosinophils Relative: 1 % (ref 0–5)
Lymphocytes Relative: 17 % (ref 12–46)
MCH: 30.7 pg (ref 26.0–34.0)
MCV: 93.3 fL (ref 78.0–100.0)
Platelets: 343 10*3/uL (ref 150–400)
RDW: 15.1 % (ref 11.5–15.5)
WBC: 12.8 10*3/uL — ABNORMAL HIGH (ref 4.0–10.5)

## 2012-01-01 ENCOUNTER — Other Ambulatory Visit (HOSPITAL_COMMUNITY): Payer: Self-pay

## 2012-01-01 ENCOUNTER — Encounter (HOSPITAL_COMMUNITY): Payer: Self-pay | Admitting: Internal Medicine

## 2012-01-01 LAB — CBC WITH DIFFERENTIAL/PLATELET
Basophils Absolute: 0 10*3/uL (ref 0.0–0.1)
Eosinophils Relative: 1 % (ref 0–5)
HCT: 35.8 % — ABNORMAL LOW (ref 39.0–52.0)
Hemoglobin: 11.5 g/dL — ABNORMAL LOW (ref 13.0–17.0)
Lymphocytes Relative: 18 % (ref 12–46)
Lymphs Abs: 2.6 10*3/uL (ref 0.7–4.0)
MCV: 95.2 fL (ref 78.0–100.0)
Monocytes Absolute: 1.2 10*3/uL — ABNORMAL HIGH (ref 0.1–1.0)
Monocytes Relative: 9 % (ref 3–12)
Neutro Abs: 10.3 10*3/uL — ABNORMAL HIGH (ref 1.7–7.7)
RBC: 3.76 MIL/uL — ABNORMAL LOW (ref 4.22–5.81)
RDW: 15.7 % — ABNORMAL HIGH (ref 11.5–15.5)
WBC: 14.3 10*3/uL — ABNORMAL HIGH (ref 4.0–10.5)

## 2012-01-01 LAB — BASIC METABOLIC PANEL
CO2: 31 mEq/L (ref 19–32)
Chloride: 93 mEq/L — ABNORMAL LOW (ref 96–112)
Creatinine, Ser: 1.64 mg/dL — ABNORMAL HIGH (ref 0.50–1.35)
Glucose, Bld: 237 mg/dL — ABNORMAL HIGH (ref 70–99)

## 2012-01-01 LAB — PROTIME-INR: INR: 1.58 — ABNORMAL HIGH (ref 0.00–1.49)

## 2012-01-01 LAB — PROCALCITONIN: Procalcitonin: <0.1 ng/mL

## 2012-01-02 LAB — CBC WITH DIFFERENTIAL/PLATELET
Basophils Absolute: 0 10*3/uL (ref 0.0–0.1)
Basophils Relative: 0 % (ref 0–1)
Eosinophils Absolute: 0.2 10*3/uL (ref 0.0–0.7)
Eosinophils Relative: 1 % (ref 0–5)
HCT: 36.5 % — ABNORMAL LOW (ref 39.0–52.0)
Hemoglobin: 11.8 g/dL — ABNORMAL LOW (ref 13.0–17.0)
Lymphocytes Relative: 17 % (ref 12–46)
Lymphs Abs: 2.9 10*3/uL (ref 0.7–4.0)
MCH: 30.7 pg (ref 26.0–34.0)
MCHC: 32.3 g/dL (ref 30.0–36.0)
MCV: 95.1 fL (ref 78.0–100.0)
Monocytes Absolute: 1.4 10*3/uL — ABNORMAL HIGH (ref 0.1–1.0)
Monocytes Relative: 9 % (ref 3–12)
Neutro Abs: 12.2 10*3/uL — ABNORMAL HIGH (ref 1.7–7.7)
Neutrophils Relative %: 73 % (ref 43–77)
Platelets: 313 10*3/uL (ref 150–400)
RBC: 3.84 MIL/uL — ABNORMAL LOW (ref 4.22–5.81)
RDW: 15.7 % — ABNORMAL HIGH (ref 11.5–15.5)
WBC: 16.8 10*3/uL — ABNORMAL HIGH (ref 4.0–10.5)

## 2012-01-02 LAB — BASIC METABOLIC PANEL
BUN: 29 mg/dL — ABNORMAL HIGH (ref 6–23)
CO2: 33 mEq/L — ABNORMAL HIGH (ref 19–32)
Calcium: 9.5 mg/dL (ref 8.4–10.5)
Chloride: 95 mEq/L — ABNORMAL LOW (ref 96–112)
Creatinine, Ser: 1.44 mg/dL — ABNORMAL HIGH (ref 0.50–1.35)
GFR calc Af Amer: 54 mL/min — ABNORMAL LOW (ref >90)
GFR calc non Af Amer: 46 mL/min — ABNORMAL LOW (ref >90)
Glucose, Bld: 240 mg/dL — ABNORMAL HIGH (ref 70–99)
Potassium: 3.3 mEq/L — ABNORMAL LOW (ref 3.5–5.1)
Sodium: 136 mEq/L (ref 135–145)

## 2012-01-02 LAB — PROTIME-INR
INR: 1.46 (ref 0.00–1.49)
Prothrombin Time: 17.3 seconds — ABNORMAL HIGH (ref 11.6–15.2)

## 2012-01-03 LAB — LIPASE, BLOOD: Lipase: 41 U/L (ref 11–59)

## 2012-01-03 LAB — COMPREHENSIVE METABOLIC PANEL
AST: 27 U/L (ref 0–37)
Albumin: 2.8 g/dL — ABNORMAL LOW (ref 3.5–5.2)
Alkaline Phosphatase: 105 U/L (ref 39–117)
Chloride: 95 mEq/L — ABNORMAL LOW (ref 96–112)
Potassium: 3.9 mEq/L (ref 3.5–5.1)
Sodium: 137 mEq/L (ref 135–145)
Total Bilirubin: 0.4 mg/dL (ref 0.3–1.2)
Total Protein: 8.2 g/dL (ref 6.0–8.3)

## 2012-01-03 LAB — CBC WITH DIFFERENTIAL/PLATELET
Hemoglobin: 12.2 g/dL — ABNORMAL LOW (ref 13.0–17.0)
Lymphocytes Relative: 16 % (ref 12–46)
Lymphs Abs: 2.4 10*3/uL (ref 0.7–4.0)
MCH: 30.9 pg (ref 26.0–34.0)
Monocytes Relative: 7 % (ref 3–12)
Neutro Abs: 11.2 10*3/uL — ABNORMAL HIGH (ref 1.7–7.7)
Neutrophils Relative %: 76 % (ref 43–77)
Platelets: 280 10*3/uL (ref 150–400)
RBC: 3.95 MIL/uL — ABNORMAL LOW (ref 4.22–5.81)
WBC: 14.7 10*3/uL — ABNORMAL HIGH (ref 4.0–10.5)

## 2012-01-03 LAB — CULTURE, BLOOD (ROUTINE X 2): Culture: NO GROWTH

## 2012-01-03 LAB — PHOSPHORUS: Phosphorus: 3.4 mg/dL (ref 2.3–4.6)

## 2012-01-03 LAB — PROTIME-INR: Prothrombin Time: 17.2 seconds — ABNORMAL HIGH (ref 11.6–15.2)

## 2012-01-03 LAB — AMYLASE: Amylase: 76 U/L (ref 0–105)

## 2012-01-04 LAB — BASIC METABOLIC PANEL
BUN: 38 mg/dL — ABNORMAL HIGH (ref 6–23)
CO2: 32 mEq/L (ref 19–32)
Calcium: 9.6 mg/dL (ref 8.4–10.5)
GFR calc non Af Amer: 36 mL/min — ABNORMAL LOW (ref >90)
Glucose, Bld: 228 mg/dL — ABNORMAL HIGH (ref 70–99)
Sodium: 137 mEq/L (ref 135–145)

## 2012-01-04 LAB — PROTIME-INR: INR: 1.55 — ABNORMAL HIGH (ref 0.00–1.49)

## 2012-01-05 LAB — PROTIME-INR: Prothrombin Time: 19.4 seconds — ABNORMAL HIGH (ref 11.6–15.2)

## 2012-01-06 LAB — BASIC METABOLIC PANEL
CO2: 27 mEq/L (ref 19–32)
Calcium: 9.9 mg/dL (ref 8.4–10.5)
Chloride: 92 mEq/L — ABNORMAL LOW (ref 96–112)
Creatinine, Ser: 1.65 mg/dL — ABNORMAL HIGH (ref 0.50–1.35)
GFR calc Af Amer: 46 mL/min — ABNORMAL LOW (ref >90)
Sodium: 133 mEq/L — ABNORMAL LOW (ref 135–145)

## 2012-01-06 LAB — CBC WITH DIFFERENTIAL/PLATELET
Basophils Relative: 0 % (ref 0–1)
Eosinophils Absolute: 0.1 10*3/uL (ref 0.0–0.7)
Eosinophils Relative: 1 % (ref 0–5)
Lymphocytes Relative: 27 % (ref 12–46)
Lymphs Abs: 3 10*3/uL (ref 0.7–4.0)
Monocytes Absolute: 0.8 10*3/uL (ref 0.1–1.0)
Neutro Abs: 7.1 10*3/uL (ref 1.7–7.7)
Platelets: 248 10*3/uL (ref 150–400)
RBC: 4.39 MIL/uL (ref 4.22–5.81)
RDW: 15.3 % (ref 11.5–15.5)
WBC: 11 10*3/uL — ABNORMAL HIGH (ref 4.0–10.5)

## 2012-01-06 LAB — PROTIME-INR
INR: 1.62 — ABNORMAL HIGH (ref 0.00–1.49)
Prothrombin Time: 18.7 seconds — ABNORMAL HIGH (ref 11.6–15.2)

## 2012-01-07 LAB — BASIC METABOLIC PANEL
BUN: 46 mg/dL — ABNORMAL HIGH (ref 6–23)
Calcium: 9.8 mg/dL (ref 8.4–10.5)
Creatinine, Ser: 1.66 mg/dL — ABNORMAL HIGH (ref 0.50–1.35)
GFR calc Af Amer: 45 mL/min — ABNORMAL LOW (ref >90)
GFR calc non Af Amer: 39 mL/min — ABNORMAL LOW (ref >90)

## 2012-01-08 LAB — BASIC METABOLIC PANEL
BUN: 49 mg/dL — ABNORMAL HIGH (ref 6–23)
CO2: 28 mEq/L (ref 19–32)
Calcium: 9.2 mg/dL (ref 8.4–10.5)
Creatinine, Ser: 1.74 mg/dL — ABNORMAL HIGH (ref 0.50–1.35)
GFR calc non Af Amer: 37 mL/min — ABNORMAL LOW (ref >90)
Glucose, Bld: 199 mg/dL — ABNORMAL HIGH (ref 70–99)

## 2012-01-08 LAB — CK TOTAL AND CKMB (NOT AT ARMC): CK, MB: 3.5 ng/mL (ref 0.3–4.0)

## 2012-01-08 LAB — PROTIME-INR
INR: 2.16 — ABNORMAL HIGH (ref 0.00–1.49)
Prothrombin Time: 23.2 seconds — ABNORMAL HIGH (ref 11.6–15.2)

## 2012-01-08 NOTE — Progress Notes (Signed)
Asked to see Vincent [name] because of atrial fib, possible tachy/brady. Reviewed chart, pt was seen by [facility], last on 9/13. Contacted [facility] and advised them of this info, left my bpr number in case we can help. [name] 01/08/2012 3:16 PM

## 2012-01-09 LAB — BASIC METABOLIC PANEL
Calcium: 9.4 mg/dL (ref 8.4–10.5)
GFR calc Af Amer: 50 mL/min — ABNORMAL LOW (ref >90)
GFR calc non Af Amer: 43 mL/min — ABNORMAL LOW (ref >90)
Glucose, Bld: 204 mg/dL — ABNORMAL HIGH (ref 70–99)
Potassium: 3.6 mEq/L (ref 3.5–5.1)
Sodium: 137 mEq/L (ref 135–145)

## 2012-01-09 LAB — PROTIME-INR: INR: 2.14 — ABNORMAL HIGH (ref 0.00–1.49)

## 2012-01-09 LAB — CK TOTAL AND CKMB (NOT AT ARMC)
CK, MB: 3.2 ng/mL (ref 0.3–4.0)
Relative Index: INVALID (ref 0.0–2.5)

## 2012-01-11 LAB — PROTIME-INR: INR: 2.88 — ABNORMAL HIGH (ref 0.00–1.49)

## 2012-01-13 LAB — BASIC METABOLIC PANEL
Calcium: 9.7 mg/dL (ref 8.4–10.5)
Chloride: 102 mEq/L (ref 96–112)
Creatinine, Ser: 1.4 mg/dL — ABNORMAL HIGH (ref 0.50–1.35)
GFR calc Af Amer: 56 mL/min — ABNORMAL LOW (ref >90)
Sodium: 137 mEq/L (ref 135–145)

## 2012-01-13 LAB — PROTIME-INR
INR: 2.98 — ABNORMAL HIGH (ref 0.00–1.49)
Prothrombin Time: 29.4 seconds — ABNORMAL HIGH (ref 11.6–15.2)

## 2012-01-14 LAB — PROTIME-INR
INR: 2.5 — ABNORMAL HIGH (ref 0.00–1.49)
Prothrombin Time: 25.8 seconds — ABNORMAL HIGH (ref 11.6–15.2)

## 2012-01-15 LAB — URINE CULTURE

## 2012-02-04 ENCOUNTER — Encounter: Payer: Medicare Other | Attending: Physician Assistant | Admitting: Dietician

## 2012-02-04 ENCOUNTER — Encounter: Payer: Self-pay | Admitting: Dietician

## 2012-02-04 VITALS — Ht 69.0 in | Wt 259.8 lb

## 2012-02-04 DIAGNOSIS — E119 Type 2 diabetes mellitus without complications: Secondary | ICD-10-CM | POA: Diagnosis present

## 2012-02-04 NOTE — Progress Notes (Signed)
Medical Nutrition Therapy:  Appt start time: 0930 end time:  1100.   Assessment:  Primary concerns today: Comes requesting a good diet.  Wants some help with over eating and help trying to get some weight off.  Has a history of diabetes since 1984 or 30 years.  He sees Dr. Margaretmary Bayley for his diabetes management.  Recently hospitalized for CHF and at that time his glucose was elevated with an A1C of 8.8% (12/19/2011).  During hospitalization, his breathing and fluid levels improved.  Since coming home, he has" not been taking the Lasix and his ankles are more swollen and he is getting short of breath more quickly when walking."  The RT leg is at 4+ edema and tight, the LF is smaller and not so tight at about a 2-3 +.  He is accompanied by his wife.  Both are seeking assistance with his diet. Today, he c/o an issue with constipation.  Recommended that he contact his MD regarding the fluid collection in his body and the increased difficulty breathing and to ask for a suggestion regarding an intervention for the constipation.  I recommended making sure he had his fluids, using cooked vegetables and fruits for the fiber and pectin that many contain.  MEDICATIONS: Med review completed.  He comes without his medications or a list.  The only med for glucose control is insulin 70/30 at Am=50 units and PM=25 units.    BLOOD GLUCOSE MONITORING:  Does not have his meter or glucose log.  Currently monitoring fasting glucose daily.  Fasting:  Recall is very limited.  Only recalls that on yesterday, he had a 57 mg reading for his fasting and he drank some orange juice and this brought it up to 80 mg before lunch.  Testing is inconsistent.  HYPOGLYCEMIA:  Reports recent incident of low blood glucose which he treated with orange juice.  HYPERGLYCEMIA: Gives no obvious S/S of high blood glucose.  Most recent A1C at 8.8%  Self-Foot Exam:  Not assessed.  DILATED EYE EXAM:  Has not had an exam in over a year.   Currently with glucose and probable blood glucose issues.     DIETARY INTAKE:  Usual eating pattern includes varying  meals and frequent snacks per day.  Does note that he is trying to eat better since his hospitalization  Everyday foods include fruits, starches, sweetened beverages.  Avoided foods include tries to avoid salty foods.    24-hr recall:  B ( AM):  8:00 AM Corn flakes 2 cups and skim milk 6 oz, and some fruit peaches or fruit cocktail,  Fruit cup, eats all.  and decaf coffee or sometimes toast whole wheat, trying to keep at one slice and eggs 1-2 and regular cheese, uses both the packets and prepared at home grits 1 cup..  Snk ( AM): mid morning,  About 10:00 snacking on piece of fruit or the cheeseits, or dry cereal, of cookies or bananas.  L ( PM): 12:30  Ox tail, rice 3/4 with gravy, ice tea sweetened  12 oz.,  Snk ( PM): Mid PM has a snack with the kid when they come home from school, PB crackers (packaged Lance)or cookies. D ( PM): 6-7:00 chicken wings, stewed, 6 wings, iced water.  Snk ( PM): no typically having snacks in the evening.  Not having popcorn Beverages: decaf coffee, water, sulser water, juices, sweetened tea.  Usual physical activity: Currently on a walker, and becomes short of breath walking across the room.Marland Kitchen  Estimated energy needs:HT: 69 in  WT: 259.8 lb  BMI; 38.4 kg/m2  Adj WT:198 lb (90 kg0 1700 calories 190-195 g carbohydrates 125-130 g protein 45-47 g fat  Progress Towards Goal(s):  No progress.   Nutritional Diagnosis:  [place]-2.1 Inpaired nutrition utilization As related to blood glucose.  As evidenced by history of diabetes for 29 years, recent A1C at 8.8%, use of 70/30 insulin for blood glucose control.    Intervention:  Nutrition Recommended continuing to eat regular meals and snacks.  Try to omit the sweetened beverages from his diet.  Use the diet juices or sweeten with the Splenda or use the diet preparations.  Use the food label and aim for  no more than 0-9 gm of sugar per serving of food or beverage.  Aim to increase fiber intake in the form of whole grains and cereal, use of the cooked non-starchy vegetables.  Try to limit the use of the dark leafy green due to the Vitamin K levels in them. Try to keep the carb servings to 4 at breakfast and 3 at lunch and dinner.  Aim for one serving of carb at snack time.  Try to cook more of your food items and not add salt.  Avoid the processed items with more salt and sugar and less fiber in therm.  Keep the starch at 2/3 to 3/4 cup at a meal.    Handouts given during visit include:  Living Well with Diabetes  Carbohydrate Counting by [employer] Suggestions for 45 and 60 gm CHO meals.  Snack list  List of Herb and Seasoning Suggestions.  Controlling Blood Glucose.  Monitoring/Evaluation:  Dietary intake, exercise, blood glucose levels, and body weight to come when they want more information and support.  He made it clear that he has difficulty getting around and limits the appointments he needs to make.

## 2012-02-04 NOTE — Patient Instructions (Addendum)
   Pour the juice off of the fruit cup to help keep the glucose down.  On the food label try to keep the sugar level at (0-9 gm).  Look for a low sodium cheese., softer cheeses have less salt.  Use the home prepared grits as much as possible, limit the salt you add.  Try to avoid the packs of instant grits.  Keep the grits ar 1 cup and 1 slice of toast every other day.  Consider a low sodium or low salt peanut butter.  Use fruit for snacks and watch the serving sizes.  Avoid the juice.  If you use juice use the diet juice.    For the sweet tea start trying the Splenda blend to sweeten the the tea,.  Try to keep the starch to 2/3 to 3/4 cup for the meal.   Use the 4 servings or 4 (C) at breakfast and the 3 servings (30 for lunch and dinner.  Try the unsalted top crackers.  Call the doctor regarding the ankles and something for the constipation like a Miralax.

## 2012-02-09 ENCOUNTER — Emergency Department (HOSPITAL_COMMUNITY): Payer: Medicare Other

## 2012-02-09 ENCOUNTER — Encounter (HOSPITAL_COMMUNITY): Payer: Self-pay | Admitting: Emergency Medicine

## 2012-02-09 ENCOUNTER — Emergency Department (HOSPITAL_COMMUNITY)
Admission: EM | Admit: 2012-02-09 | Discharge: 2012-02-09 | Disposition: A | Discharge status: Home / Self Care | Payer: Medicare Other | Attending: Emergency Medicine | Admitting: Emergency Medicine

## 2012-02-09 DIAGNOSIS — N189 Chronic kidney disease, unspecified: Secondary | ICD-10-CM | POA: Insufficient documentation

## 2012-02-09 DIAGNOSIS — I509 Heart failure, unspecified: Secondary | ICD-10-CM | POA: Insufficient documentation

## 2012-02-09 DIAGNOSIS — E1169 Type 2 diabetes mellitus with other specified complication: Secondary | ICD-10-CM | POA: Insufficient documentation

## 2012-02-09 DIAGNOSIS — R0602 Shortness of breath: Secondary | ICD-10-CM | POA: Insufficient documentation

## 2012-02-09 DIAGNOSIS — I129 Hypertensive chronic kidney disease with stage 1 through stage 4 chronic kidney disease, or unspecified chronic kidney disease: Secondary | ICD-10-CM | POA: Insufficient documentation

## 2012-02-09 DIAGNOSIS — Z794 Long term (current) use of insulin: Secondary | ICD-10-CM | POA: Insufficient documentation

## 2012-02-09 DIAGNOSIS — Z79899 Other long term (current) drug therapy: Secondary | ICD-10-CM | POA: Insufficient documentation

## 2012-02-09 DIAGNOSIS — Z8546 Personal history of malignant neoplasm of prostate: Secondary | ICD-10-CM | POA: Insufficient documentation

## 2012-02-09 DIAGNOSIS — N390 Urinary tract infection, site not specified: Secondary | ICD-10-CM

## 2012-02-09 DIAGNOSIS — Z8679 Personal history of other diseases of the circulatory system: Secondary | ICD-10-CM | POA: Insufficient documentation

## 2012-02-09 DIAGNOSIS — K219 Gastro-esophageal reflux disease without esophagitis: Secondary | ICD-10-CM | POA: Insufficient documentation

## 2012-02-09 DIAGNOSIS — E162 Hypoglycemia, unspecified: Secondary | ICD-10-CM

## 2012-02-09 DIAGNOSIS — I4891 Unspecified atrial fibrillation: Secondary | ICD-10-CM | POA: Insufficient documentation

## 2012-02-09 DIAGNOSIS — Z8701 Personal history of pneumonia (recurrent): Secondary | ICD-10-CM | POA: Insufficient documentation

## 2012-02-09 LAB — COMPREHENSIVE METABOLIC PANEL
ALT: 32 U/L (ref 0–53)
AST: 42 U/L — ABNORMAL HIGH (ref 0–37)
Alkaline Phosphatase: 117 U/L (ref 39–117)
CO2: 30 mEq/L (ref 19–32)
Chloride: 95 mEq/L — ABNORMAL LOW (ref 96–112)
GFR calc non Af Amer: 43 mL/min — ABNORMAL LOW (ref >90)
Sodium: 135 mEq/L (ref 135–145)
Total Bilirubin: 0.8 mg/dL (ref 0.3–1.2)

## 2012-02-09 LAB — PROTIME-INR
INR: 2.26 — ABNORMAL HIGH (ref 0.00–1.49)
Prothrombin Time: 24 seconds — ABNORMAL HIGH (ref 11.6–15.2)

## 2012-02-09 LAB — GLUCOSE, CAPILLARY: Glucose-Capillary: 88 mg/dL (ref 70–99)

## 2012-02-09 LAB — CBC
MCH: 29.8 pg (ref 26.0–34.0)
MCHC: 31.7 g/dL (ref 30.0–36.0)
Platelets: 253 10*3/uL (ref 150–400)
RDW: 16.7 % — ABNORMAL HIGH (ref 11.5–15.5)

## 2012-02-09 LAB — URINALYSIS, ROUTINE W REFLEX MICROSCOPIC
Bilirubin Urine: NEGATIVE
Glucose, UA: NEGATIVE mg/dL
Ketones, ur: NEGATIVE mg/dL
Nitrite: NEGATIVE
Protein, ur: 30 mg/dL — AB
Specific Gravity, Urine: 1.012 (ref 1.005–1.030)
Urobilinogen, UA: 1 mg/dL (ref 0.0–1.0)
pH: 5.5 (ref 5.0–8.0)

## 2012-02-09 LAB — URINE MICROSCOPIC-ADD ON

## 2012-02-09 MED ORDER — DEXTROSE 5 % IV SOLN
1.0000 g | Freq: Once | INTRAVENOUS | Status: AC
  Administered 2012-02-09: 1 g via INTRAVENOUS
  Filled 2012-02-09: qty 10

## 2012-02-09 MED ORDER — CEPHALEXIN 500 MG PO CAPS: 500.0000 mg | ORAL_CAPSULE | Freq: Four times a day (QID) | ORAL | Status: DC

## 2012-02-09 NOTE — ED Notes (Signed)
Pt has had trouble with his blood sugar dropping since Thursday, sts he has been taking medication as prescribed.  Pt hasn't had problems with blood sugar dropping in the past.  Today pt became dizzy and passed out at home, family called EMS.  Not complaining of being dizzy at this time.

## 2012-02-09 NOTE — ED Notes (Signed)
Patient transported to X-ray 

## 2012-02-09 NOTE — ED Provider Notes (Signed)
6:57 PM  Date: 02/09/2012  Rate: 54  Rhythm: normal sinus rhythm  QRS Axis: left  Intervals: PR prolonged  ST/T Wave abnormalities: nonspecific T wave changes  Conduction Disutrbances:first-degree A-V block  and left anterior fascicular block  Narrative Interpretation: Abnormal EKG  Old EKG Reviewed: changes noted--Was in atrial flutter with variable block on tracing done on 01/08/2012.    Carleene Cooper III, MD 02/09/12 [phone]

## 2012-02-09 NOTE — ED Provider Notes (Signed)
History     CSN: [id number]  Arrival date & time 02/09/12  1643   First MD Initiated Contact with Patient 02/09/12 1816      Chief Complaint  Patient presents with  . Hypoglycemia    (Consider location/radiation/quality/duration/timing/severity/associated sxs/prior treatment) HPI Patient presents to the emergency department with hypoglycemia.  Patient states that his blood sugars have been going up and down over the last 3 days.  Patient states that today his blood sugar dropped to 30 and EMS was called.  They state that they gave him medications and his blood sugar rose to 155.  Patient states it went back down and therefore, decided to come into the emergency room for evaluation.  Patient had spoken with his doctor about his blood sugars and being up and down and he was scheduled to see him tomorrow morning.  Patient denies nausea, vomiting, diarrhea, headache, visual changes, chest pain, shortness of breath, cough, fever, dysuria, back pain, or weakness.  Patient states that he did have some sweats 2 days ago, but otherwise has not had any other symptoms.  The patient did show adjust his insulin levels to accommodate for his lower blood sugar.  Past Medical History  Diagnosis Date  . Diabetes mellitus   . Hypertension   . CHF (congestive heart failure)   . Cancer   . Pneumonia   . Irregular heartbeat   . Prostate cancer   . Shortness of breath   . GERD (gastroesophageal reflux disease)   . Acute on chronic diastolic CHF (congestive heart failure) 12/18/2011  . Atrial fibrillation with rapid ventricular response, history of PAF was in SR in 08/2011 12/18/2011  . CKD (chronic kidney disease) stage 3, GFR 30-59 ml/min 12/26/2011    Past Surgical History  Procedure Date  . Prostate surgery   . Tee without cardioversion 12/27/2011    Procedure: TRANSESOPHAGEAL ECHOCARDIOGRAM (TEE);  Surgeon: Chrystie Nose, MD;  Location: [facility] OR;  Service: Cardiovascular;  Laterality: N/A;  MD request  Main OR  . Cardioversion 12/27/2011    Procedure: CARDIOVERSION;  Surgeon: Chrystie Nose, MD;  Location: [facility] OR;  Service: Cardiovascular;  Laterality: N/A;    History reviewed. No pertinent family history.  History  Substance Use Topics  . Smoking status: Never Smoker   . Smokeless tobacco: Never Used  . Alcohol Use: No      Review of Systems All other systems negative except as documented in the HPI. All pertinent positives and negatives as reviewed in the HPI.  Allergies  Review of patient's allergies indicates no known allergies.  Home Medications   Current Outpatient Rx  Name Route Sig Dispense Refill  . ALLOPURINOL 300 MG PO TABS Oral Take 300 mg by mouth daily.      Marland Kitchen CARVEDILOL 12.5 MG PO TABS Oral Take 2 tablets (25 mg total) by mouth 2 (two) times daily with a meal.    . DILTIAZEM HCL ER COATED BEADS 240 MG PO TB24 Oral Take 1 tablet (240 mg total) by mouth daily. 30 tablet 0  . FUROSEMIDE 80 MG PO TABS Oral Take 0.5 tablets (40 mg total) by mouth 2 (two) times daily. 30 tablet 0  . HYDROCORTISONE 20 MG PO TABS Oral Take 20 mg by mouth 2 (two) times daily.    . INSULIN ASPART 100 UNIT/ML [place] SOLN Subcutaneous Inject 0-20 Units into the skin 3 (three) times daily with meals. 1 vial 0  . INSULIN ISOPHANE & REGULAR (70-30) 100 UNIT/ML [place]  SUSP Subcutaneous Inject 10 Units into the skin 2 (two) times daily with a meal. 10 mL   . KETOCONAZOLE 200 MG PO TABS Oral Take 400 mg by mouth 3 (three) times daily.    Marland Kitchen RIVAROXABAN 15 MG PO TABS Oral Take 1 tablet (15 mg total) by mouth daily. To start then INR <2 30 tablet 0    BP 106/44  Pulse 53  Temp 97.6 F (36.4 C) (Oral)  Resp 20  SpO2 100%  Physical Exam  Nursing note and vitals reviewed. Constitutional: He is oriented to person, place, and time. He appears well-developed and well-nourished. No distress.  HENT:  Head: Normocephalic and atraumatic.  Mouth/Throat: Oropharynx is clear and moist.  Eyes: Pupils are  equal, round, and reactive to light.  Neck: Normal range of motion. Neck supple.  Cardiovascular: Normal rate, regular rhythm and normal heart sounds.  Exam reveals no gallop and no friction rub.   No murmur heard. Pulmonary/Chest: Effort normal and breath sounds normal. No respiratory distress.  Abdominal: Soft. Bowel sounds are normal. He exhibits no distension. There is no tenderness. There is no guarding.  Neurological: He is alert and oriented to person, place, and time. He exhibits normal muscle tone. Coordination normal.  Skin: Skin is warm and dry. No rash noted. He is not diaphoretic.    ED Course  Procedures (including critical care time)   Labs Reviewed  GLUCOSE, CAPILLARY  COMPREHENSIVE METABOLIC PANEL  CBC   Patient is given expected plan here in the emergency department and all questions were answered.  Vision is stable at this time and was given a carb  modified diet due his blood sugar being somewhat low.  Patient has been monitored here in the emergency department and rechecked x3.  Patient in stable.  Going to recheck his CBG.  Patient most likely by mouth to go home with antibiotics and follow up with his primary doctor tomorrow.  Patient has an appointment in the morning with his primary care doctor.  The patient will most likely need to cut his insulin dosage down to accommodate for his blood sugars being lower.  MDM   Date: 02/09/2012  Rate: 54  Rhythm: normal sinus rhythm  QRS Axis: normal  Intervals: normal  ST/T Wave abnormalities: nonspecific T wave changes  Conduction Disutrbances:first-degree A-V block  and left anterior fascicular block  Narrative Interpretation:   Old EKG Reviewed: unchanged          Carlyle Dolly, PA-C 02/09/12 2302

## 2012-02-09 NOTE — ED Notes (Signed)
Pt reports having several day hx of having hypoglycemia, has tried reducing insulin dosage with no relief

## 2012-02-09 NOTE — ED Notes (Addendum)
Pt lethargic in triage, having to wake up for questions

## 2012-02-09 NOTE — ED Notes (Signed)
Reports 4-5 day hx of low BS, when EMS arrived CBG 31, last check 115- was given D10 25 g, glucagon, drank orange juice; 22g L hand; 144/88, 54, spo2 97%, RR 18

## 2012-02-10 NOTE — ED Provider Notes (Signed)
Medical screening examination/treatment/procedure(s) were conducted as a shared visit with non-physician practitioner(s) and myself.  I personally evaluated the patient during the encounter Pt with diabetes presented with hypoglycemia.  His workup showed a UTI.  Treated for his hypoglycemia and UTI.  Carleene Cooper III, MD 02/10/12 [phone]

## 2012-02-11 LAB — URINE CULTURE: Colony Count: >=100000

## 2012-02-12 NOTE — ED Notes (Signed)
+   Urine Patient treated with Keflex-sensitive to same-chart appended per protocol MD. 

## 2012-03-31 ENCOUNTER — Emergency Department (HOSPITAL_COMMUNITY): Payer: Medicare Other

## 2012-03-31 ENCOUNTER — Inpatient Hospital Stay (HOSPITAL_COMMUNITY)
Admission: EM | Admit: 2012-03-31 | Discharge: 2012-04-06 | DRG: 193 | Disposition: A | Discharge status: Rehabilitation Facility | Payer: Medicare Other | Attending: Internal Medicine | Admitting: Internal Medicine

## 2012-03-31 ENCOUNTER — Encounter (HOSPITAL_COMMUNITY): Payer: Self-pay | Admitting: Emergency Medicine

## 2012-03-31 DIAGNOSIS — E872 Acidosis, unspecified: Secondary | ICD-10-CM

## 2012-03-31 DIAGNOSIS — I129 Hypertensive chronic kidney disease with stage 1 through stage 4 chronic kidney disease, or unspecified chronic kidney disease: Secondary | ICD-10-CM | POA: Diagnosis present

## 2012-03-31 DIAGNOSIS — R5381 Other malaise: Secondary | ICD-10-CM | POA: Diagnosis present

## 2012-03-31 DIAGNOSIS — I471 Supraventricular tachycardia: Secondary | ICD-10-CM

## 2012-03-31 DIAGNOSIS — D638 Anemia in other chronic diseases classified elsewhere: Secondary | ICD-10-CM | POA: Diagnosis present

## 2012-03-31 DIAGNOSIS — I5189 Other ill-defined heart diseases: Secondary | ICD-10-CM | POA: Diagnosis present

## 2012-03-31 DIAGNOSIS — E119 Type 2 diabetes mellitus without complications: Secondary | ICD-10-CM

## 2012-03-31 DIAGNOSIS — I498 Other specified cardiac arrhythmias: Secondary | ICD-10-CM

## 2012-03-31 DIAGNOSIS — I509 Heart failure, unspecified: Secondary | ICD-10-CM | POA: Diagnosis present

## 2012-03-31 DIAGNOSIS — I5033 Acute on chronic diastolic (congestive) heart failure: Secondary | ICD-10-CM

## 2012-03-31 DIAGNOSIS — B9689 Other specified bacterial agents as the cause of diseases classified elsewhere: Secondary | ICD-10-CM | POA: Diagnosis present

## 2012-03-31 DIAGNOSIS — Z7901 Long term (current) use of anticoagulants: Secondary | ICD-10-CM

## 2012-03-31 DIAGNOSIS — J189 Pneumonia, unspecified organism: Principal | ICD-10-CM

## 2012-03-31 DIAGNOSIS — R7989 Other specified abnormal findings of blood chemistry: Secondary | ICD-10-CM

## 2012-03-31 DIAGNOSIS — R509 Fever, unspecified: Secondary | ICD-10-CM

## 2012-03-31 DIAGNOSIS — I4891 Unspecified atrial fibrillation: Secondary | ICD-10-CM

## 2012-03-31 DIAGNOSIS — C61 Malignant neoplasm of prostate: Secondary | ICD-10-CM

## 2012-03-31 DIAGNOSIS — R112 Nausea with vomiting, unspecified: Secondary | ICD-10-CM

## 2012-03-31 DIAGNOSIS — I499 Cardiac arrhythmia, unspecified: Secondary | ICD-10-CM

## 2012-03-31 DIAGNOSIS — I10 Essential (primary) hypertension: Secondary | ICD-10-CM

## 2012-03-31 DIAGNOSIS — N390 Urinary tract infection, site not specified: Secondary | ICD-10-CM

## 2012-03-31 DIAGNOSIS — I513 Intracardiac thrombosis, not elsewhere classified: Secondary | ICD-10-CM

## 2012-03-31 DIAGNOSIS — D649 Anemia, unspecified: Secondary | ICD-10-CM

## 2012-03-31 DIAGNOSIS — D72829 Elevated white blood cell count, unspecified: Secondary | ICD-10-CM

## 2012-03-31 DIAGNOSIS — I5032 Chronic diastolic (congestive) heart failure: Secondary | ICD-10-CM | POA: Diagnosis present

## 2012-03-31 DIAGNOSIS — R7881 Bacteremia: Secondary | ICD-10-CM

## 2012-03-31 DIAGNOSIS — I5022 Chronic systolic (congestive) heart failure: Secondary | ICD-10-CM

## 2012-03-31 DIAGNOSIS — N183 Chronic kidney disease, stage 3 unspecified: Secondary | ICD-10-CM

## 2012-03-31 DIAGNOSIS — I4719 Other supraventricular tachycardia: Secondary | ICD-10-CM

## 2012-03-31 DIAGNOSIS — G9340 Encephalopathy, unspecified: Secondary | ICD-10-CM | POA: Diagnosis present

## 2012-03-31 LAB — URINALYSIS, ROUTINE W REFLEX MICROSCOPIC
Bilirubin Urine: NEGATIVE
Glucose, UA: NEGATIVE mg/dL
Ketones, ur: NEGATIVE mg/dL
Nitrite: POSITIVE — AB
Protein, ur: >300 mg/dL — AB
Specific Gravity, Urine: 1.026 (ref 1.005–1.030)
Urobilinogen, UA: 1 mg/dL (ref 0.0–1.0)
pH: 5.5 (ref 5.0–8.0)

## 2012-03-31 LAB — PRO B NATRIURETIC PEPTIDE: Pro B Natriuretic peptide (BNP): 8718 pg/mL — ABNORMAL HIGH (ref 0–125)

## 2012-03-31 LAB — CBC WITH DIFFERENTIAL/PLATELET
Basophils Absolute: 0 10*3/uL (ref 0.0–0.1)
Basophils Relative: 0 % (ref 0–1)
Eosinophils Absolute: 0.7 10*3/uL (ref 0.0–0.7)
Eosinophils Relative: 6 % — ABNORMAL HIGH (ref 0–5)
HCT: 37.2 % — ABNORMAL LOW (ref 39.0–52.0)
Hemoglobin: 12.2 g/dL — ABNORMAL LOW (ref 13.0–17.0)
Lymphocytes Relative: 17 % (ref 12–46)
Lymphs Abs: 2 10*3/uL (ref 0.7–4.0)
MCH: 30.6 pg (ref 26.0–34.0)
MCHC: 32.8 g/dL (ref 30.0–36.0)
MCV: 93.2 fL (ref 78.0–100.0)
Monocytes Absolute: 0.6 10*3/uL (ref 0.1–1.0)
Monocytes Relative: 6 % (ref 3–12)
Neutro Abs: 8.3 10*3/uL — ABNORMAL HIGH (ref 1.7–7.7)
Neutrophils Relative %: 71 % (ref 43–77)
Platelets: 199 10*3/uL (ref 150–400)
RBC: 3.99 MIL/uL — ABNORMAL LOW (ref 4.22–5.81)
RDW: 16.2 % — ABNORMAL HIGH (ref 11.5–15.5)
WBC: 11.7 10*3/uL — ABNORMAL HIGH (ref 4.0–10.5)

## 2012-03-31 LAB — COMPREHENSIVE METABOLIC PANEL
ALT: 25 U/L (ref 0–53)
AST: 38 U/L — ABNORMAL HIGH (ref 0–37)
Albumin: 3.2 g/dL — ABNORMAL LOW (ref 3.5–5.2)
Alkaline Phosphatase: 119 U/L — ABNORMAL HIGH (ref 39–117)
BUN: 23 mg/dL (ref 6–23)
CO2: 25 mEq/L (ref 19–32)
Calcium: 9.6 mg/dL (ref 8.4–10.5)
Chloride: 99 mEq/L (ref 96–112)
Creatinine, Ser: 1.42 mg/dL — ABNORMAL HIGH (ref 0.50–1.35)
GFR calc Af Amer: 55 mL/min — ABNORMAL LOW (ref >90)
GFR calc non Af Amer: 47 mL/min — ABNORMAL LOW (ref >90)
Glucose, Bld: 158 mg/dL — ABNORMAL HIGH (ref 70–99)
Potassium: 4 mEq/L (ref 3.5–5.1)
Sodium: 136 mEq/L (ref 135–145)
Total Bilirubin: 0.6 mg/dL (ref 0.3–1.2)
Total Protein: 8.2 g/dL (ref 6.0–8.3)

## 2012-03-31 LAB — LIPASE, BLOOD: Lipase: 22 U/L (ref 11–59)

## 2012-03-31 LAB — URINE MICROSCOPIC-ADD ON

## 2012-03-31 LAB — PROTIME-INR
INR: 1.28 (ref 0.00–1.49)
Prothrombin Time: 15.7 seconds — ABNORMAL HIGH (ref 11.6–15.2)

## 2012-03-31 LAB — GLUCOSE, CAPILLARY

## 2012-03-31 LAB — LACTIC ACID, PLASMA: Lactic Acid, Venous: 3.2 mmol/L — ABNORMAL HIGH (ref 0.5–2.2)

## 2012-03-31 LAB — TROPONIN I: Troponin I: <0.3 ng/mL (ref <0.30)

## 2012-03-31 MED ORDER — VANCOMYCIN HCL 10 G IV SOLR: 1500.0000 mg | Freq: Every day | INTRAVENOUS | Status: DC

## 2012-03-31 MED ORDER — SODIUM CHLORIDE 0.9 % IV SOLN
INTRAVENOUS | Status: DC
  Administered 2012-03-31: 23:00:00 via INTRAVENOUS

## 2012-03-31 MED ORDER — VANCOMYCIN HCL 10 G IV SOLR
1500.0000 mg | Freq: Once | INTRAVENOUS | Status: DC
  Administered 2012-04-01: 1500 mg via INTRAVENOUS
  Filled 2012-03-31: qty 1500

## 2012-03-31 MED ORDER — SODIUM CHLORIDE 0.9 % IV BOLUS (SEPSIS): 500.0000 mL | Freq: Once | INTRAVENOUS | Status: DC

## 2012-03-31 MED ORDER — ONDANSETRON HCL 4 MG/2ML IJ SOLN
4.0000 mg | Freq: Once | INTRAMUSCULAR | Status: AC
  Administered 2012-03-31: 4 mg via INTRAVENOUS
  Filled 2012-03-31: qty 2

## 2012-03-31 MED ORDER — DEXTROSE 5 % IV SOLN
1.0000 g | Freq: Three times a day (TID) | INTRAVENOUS | Status: DC
  Filled 2012-03-31 (×2): qty 1

## 2012-03-31 MED ORDER — LEVOFLOXACIN IN D5W 750 MG/150ML IV SOLN
750.0000 mg | INTRAVENOUS | Status: DC
  Administered 2012-03-31: 750 mg via INTRAVENOUS
  Filled 2012-03-31: qty 150

## 2012-03-31 MED ORDER — SODIUM CHLORIDE 0.9 % IV SOLN
Freq: Once | INTRAVENOUS | Status: AC
  Administered 2012-03-31: 22:00:00 via INTRAVENOUS

## 2012-03-31 NOTE — ED Provider Notes (Signed)
History    76 year old male with generalized abdominal pain, nausea vomiting and diarrhea for the past 2 days. Patient appears becoming increasingly more tired. Her daughter who is at bedside patient does not seem to be confused but he does seem very drowsy. Patient denies pain anywhere. His only complaint currently is nausea and cough. He denies any shortness of breath. No fever or chills. No urinary complaints. No sick contacts. Patient with a hospital admission in September for coagulase-negative staph bacteremia. He had TEE which did not show any vegetations. He was treated with a prolonged course of antibiotics. Patient was also evaluated in emergency room approximately 6 weeks ago and diagnosed with a urinary tract infection which was treated with Keflex.     CSN: [id number]  Arrival date & time 03/31/12  2056   First MD Initiated Contact with Patient 03/31/12 2143      Chief Complaint  Patient presents with  . Abdominal Pain  . Emesis  . Diarrhea  . Altered Mental Status    (Consider location/radiation/quality/duration/timing/severity/associated sxs/prior treatment) HPI  Past Medical History  Diagnosis Date  . Diabetes mellitus   . Hypertension   . CHF (congestive heart failure)   . Cancer   . Pneumonia   . Irregular heartbeat   . Prostate cancer   . Shortness of breath   . GERD (gastroesophageal reflux disease)   . Acute on chronic diastolic CHF (congestive heart failure) 12/18/2011  . Atrial fibrillation with rapid ventricular response, history of PAF was in SR in 08/2011 12/18/2011  . CKD (chronic kidney disease) stage 3, GFR 30-59 ml/min 12/26/2011    Past Surgical History  Procedure Date  . Prostate surgery   . Tee without cardioversion 12/27/2011    Procedure: TRANSESOPHAGEAL ECHOCARDIOGRAM (TEE);  Surgeon: Chrystie Nose, MD;  Location: [facility] OR;  Service: Cardiovascular;  Laterality: N/A;  MD request Main OR  . Cardioversion 12/27/2011    Procedure: CARDIOVERSION;   Surgeon: Chrystie Nose, MD;  Location: [facility] OR;  Service: Cardiovascular;  Laterality: N/A;    History reviewed. No pertinent family history.  History  Substance Use Topics  . Smoking status: Never Smoker   . Smokeless tobacco: Never Used  . Alcohol Use: No      Review of Systems  All systems reviewed and negative, other than as noted in HPI.   Allergies  Review of patient's allergies indicates no known allergies.  Home Medications   Current Outpatient Rx  Name  Route  Sig  Dispense  Refill  . ALLOPURINOL 100 MG PO TABS   Oral   Take 100 mg by mouth daily.         . ATENOLOL 100 MG PO TABS   Oral   Take 100 mg by mouth 2 (two) times daily.         Marland Kitchen CARVEDILOL 12.5 MG PO TABS   Oral   Take 2 tablets (25 mg total) by mouth 2 (two) times daily with a meal.         . DILTIAZEM HCL ER BEADS 300 MG PO CP24   Oral   Take 300 mg by mouth daily.         . FUROSEMIDE 80 MG PO TABS   Oral   Take 0.5 tablets (40 mg total) by mouth 2 (two) times daily.   30 tablet   0   . INSULIN ISOPHANE & REGULAR (70-30) 100 UNIT/ML [place] SUSP   Subcutaneous   Inject 20-50 Units into  the skin 2 (two) times daily with a meal. Takes 50 units in the am & 20 units in the pm         . POTASSIUM CHLORIDE CRYS ER 10 MEQ PO TBCR   Oral   Take 10 mEq by mouth daily.         Bernadette Hoit SODIUM 8.6-50 MG PO TABS   Oral   Take 1 tablet by mouth daily as needed. For constipation         . WARFARIN SODIUM 4 MG PO TABS   Oral   Take 4 mg by mouth daily.           BP 159/89  Pulse 89  Temp 100.3 F (37.9 C) (Oral)  Ht 5\' 9"  (1.753 m)  Wt 240 lb (108.863 kg)  BMI 35.44 kg/m2  SpO2 95%  Physical Exam  Nursing note and vitals reviewed. Constitutional: He appears well-developed and well-nourished. No distress.  HENT:  Head: Normocephalic and atraumatic.  Eyes: Conjunctivae normal are normal. Right eye exhibits no discharge. Left eye exhibits no discharge.   Neck: Neck supple.  Cardiovascular: Normal rate, regular rhythm and normal heart sounds.  Exam reveals no gallop and no friction rub.   No murmur heard. Pulmonary/Chest: Effort normal and breath sounds normal. No respiratory distress.  Abdominal: Soft. He exhibits no distension. There is no tenderness.  Musculoskeletal: He exhibits no edema and no tenderness.       Lower extremities symmetric as compared to each other. No calf tenderness. Negative Homan's. No palpable cords.   Neurological:       Some what drowsy. Answer questions appropriately, but somewhat slow to respond. Sometimes questions had to be asked twice as if pt did not hear initially. No obvious focal deficits noted.  Skin: Skin is warm and dry.  Psychiatric: He has a normal mood and affect. His behavior is normal. Thought content normal.    ED Course  Procedures (including critical care time)  Labs Reviewed  GLUCOSE, CAPILLARY - Abnormal; Notable for the following:    Glucose-Capillary 149 (*)     All other components within normal limits  CBC WITH DIFFERENTIAL - Abnormal; Notable for the following:    WBC 11.7 (*)     RBC 3.99 (*)     Hemoglobin 12.2 (*)     HCT 37.2 (*)     RDW 16.2 (*)     Neutro Abs 8.3 (*)     Eosinophils Relative 6 (*)     All other components within normal limits  COMPREHENSIVE METABOLIC PANEL - Abnormal; Notable for the following:    Glucose, Bld 158 (*)     Creatinine, Ser 1.42 (*)     Albumin 3.2 (*)     AST 38 (*)     Alkaline Phosphatase 119 (*)     GFR calc non Af Amer 47 (*)     GFR calc Af Amer 55 (*)     All other components within normal limits  URINALYSIS, ROUTINE W REFLEX MICROSCOPIC - Abnormal; Notable for the following:    APPearance HAZY (*)     Hgb urine dipstick MODERATE (*)     Protein, ur >300 (*)     Nitrite POSITIVE (*)     Leukocytes, UA SMALL (*)     All other components within normal limits  PRO B NATRIURETIC PEPTIDE - Abnormal; Notable for the following:     Pro B Natriuretic peptide (BNP) 8718.0 (*)  All other components within normal limits  LACTIC ACID, PLASMA - Abnormal; Notable for the following:    Lactic Acid, Venous 3.2 (*)     All other components within normal limits  PROTIME-INR - Abnormal; Notable for the following:    Prothrombin Time 15.7 (*)     All other components within normal limits  URINE MICROSCOPIC-ADD ON - Abnormal; Notable for the following:    Bacteria, UA MANY (*)     All other components within normal limits  LIPASE, BLOOD  TROPONIN I  CULTURE, BLOOD (ROUTINE X 2)  CULTURE, BLOOD (ROUTINE X 2)  URINE CULTURE  PROCALCITONIN   Dg Chest 2 View  03/31/2012  *RADIOLOGY REPORT*  Clinical Data: Shortness of breath, vomiting, altered mental status  CHEST - 2 VIEW  Comparison: 02/09/2012  Findings: Chronic interstitial markings.  No focal consolidation. No pleural effusion or pneumothorax.  Stable mild cardiomegaly.  Degenerative changes of the visualized thoracolumbar spine.  IMPRESSION: No evidence of acute cardiopulmonary disease.   Original Report Authenticated By: Charline Bills, M.D.    EKG:  Rhythm: Atrial flutter with variable block Vent. rate 109 BPM QRS duration 100 ms QT/QTc 247/332 ms Intervals/conduction: Left anterior fascicular block ST segments: Nonspecific ST changes. Comparison: Atrial flutter noted on previous EKGs.   1. UTI (urinary tract infection)   2. Elevated lactic acid level   3. Fever   4. Nausea and vomiting       MDM  76yM with fever. UA consistent with UTI. Radiology read CXR as clear aside from chronic interstitial markings. Pt's R heart border seems somewhat obscured to me though. He is not hypoxic, but given pt's cough I did give dose of abx to cover for possible HAP. Will cover for UTI as well. May be some component of volume overload which is causing cough. Elevated lactic acid. IVF, but careful given hx of heart failure. INR normal, but on further review of records, pt  changed from coumadin to xarelto recently.  Discussed with medicine for admission.        Raeford Razor, MD 04/04/12 [phone]

## 2012-03-31 NOTE — H&P (Signed)
[facility] History and Physical  Vincent [name] VWU:[id number] DOB: 1936-02-22 DOA: 03/31/2012  Referring physician: Dr. Harl Bowie PCP: [name], MD  Specialists: none  Chief Complaint: nausea vomiting and new cough  HPI: Vincent [name] is a 76 y.o. male  Past medical history chronic systolic heart failure with an EF of 45%, also left atrial thrombus currently on Coumadin with a subtherapeutic INR, also history of atrial fibrillation, discharged from the hospital in September thousand 13 for stent: His bacteremia with a negative TEE is completed IV antibiotics an outpatient, hypertension, diabetes type 2 with last hemoglobin A1c of 8.8, chronic kidney failure stage III that comes in for nausea and vomiting and cough 3 days prior to admission. He relates his cough started about 3 days prior to admission after this he started getting short of breath. And 1 day prior to admission he started developing nausea and vomiting not able to tolerate anything by mouth. He relates he feels very weak and tired not able to get out of bed secondary to weakness. He relates no fever at home. Temperature was checked here in the emergency room that shows it was 100.3.   Review of Systems: The patient denies anorexia, fever, weight loss,, vision loss, decreased hearing, hoarseness, chest pain, syncope, , peripheral edema, balance deficits, hemoptysis, abdominal pain, melena, hematochezia, severe indigestion/heartburn, hematuria, incontinence, genital sores, muscle weakness, suspicious skin lesions, transient blindness, difficulty walking, depression, unusual weight change, abnormal bleeding, enlarged lymph nodes, angioedema, and breast masses.    Past Medical History  Diagnosis Date  . Diabetes mellitus   . Hypertension   . CHF (congestive heart failure)   . Cancer   . Pneumonia   . Irregular heartbeat   . Prostate cancer   . Shortness of breath   . GERD (gastroesophageal reflux disease)   . Acute on  chronic diastolic CHF (congestive heart failure) 12/18/2011  . Atrial fibrillation with rapid ventricular response, history of PAF was in SR in 08/2011 12/18/2011  . CKD (chronic kidney disease) stage 3, GFR 30-59 ml/min 12/26/2011   Past Surgical History  Procedure Date  . Prostate surgery   . Tee without cardioversion 12/27/2011    Procedure: TRANSESOPHAGEAL ECHOCARDIOGRAM (TEE);  Surgeon: Chrystie Nose, MD;  Location: [facility] OR;  Service: Cardiovascular;  Laterality: N/A;  MD request Main OR  . Cardioversion 12/27/2011    Procedure: CARDIOVERSION;  Surgeon: Chrystie Nose, MD;  Location: [facility] OR;  Service: Cardiovascular;  Laterality: N/A;   Social History:  reports that he has never smoked. He has never used smokeless tobacco. He reports that he does not drink alcohol or use illicit drugs. Is at home with daughter can perform all his ADLs  No Known Allergies  Family History  Problem Relation Age of Onset  . Diabetes Mellitus II Mother   . Sudden death Mother   . Prostate cancer Father     Prior to Admission medications   Medication Sig Start Date End Date Taking? Authorizing Provider  allopurinol (ZYLOPRIM) 100 MG tablet Take 150 mg by mouth every morning.    Yes Historical Provider, MD  atenolol (TENORMIN) 100 MG tablet Take 100 mg by mouth 2 (two) times daily.   Yes Historical Provider, MD  diltiazem (TIAZAC) 300 MG 24 hr capsule Take 300 mg by mouth every morning.    Yes Historical Provider, MD  furosemide (LASIX) 80 MG tablet Take 0.5 tablets (40 mg total) by mouth 2 (two) times daily. 12/27/11  Yes Preetha  Jomarie Longs, MD  insulin NPH-insulin regular (NOVOLIN 70/30) (70-30) 100 UNIT/ML injection Inject 20-50 Units into the skin 2 (two) times daily with a meal. Takes 50 units in the am & 20 units in the pm 12/27/11  Yes Zannie Cove, MD  polyethylene glycol (MIRALAX / GLYCOLAX) packet Take 17 g by mouth daily as needed. For constipation   Yes Historical Provider, MD  potassium chloride  (K-DUR,KLOR-CON) 10 MEQ tablet Take 20 mEq by mouth 2 (two) times daily.    Yes Historical Provider, MD  senna-docusate (SENOKOT-S) 8.6-50 MG per tablet Take 1 tablet by mouth daily as needed. For constipation   Yes Historical Provider, MD  warfarin (COUMADIN) 4 MG tablet Take 4 mg by mouth every morning.    Yes Historical Provider, MD   Physical Exam: Filed Vitals:   03/31/12 2101 03/31/12 2237  BP: 159/89   Pulse: 89   Temp: 100.3 F (37.9 C) 101.7 F (38.7 C)  TempSrc: Oral Rectal  Height: 5\' 9"  (1.753 m)   Weight: 108.863 kg (240 lb)   SpO2: 95%      General:  Awake alert and oriented x3 in no acute distress  Eyes: Anicteric no pallor  ENT: Dry mucous membranes  Neck: No JVD  Cardiovascular: Regular rate tachycardic with a positive S1 and S2  Respiratory: Good air movement no wheezing or crackles on the left side not clear with cough  Abdomen: Positive bowel sounds nontender nondistended and soft  Skin: No rashes or ulcerations  Musculoskeletal: Intact  Psychiatric: Appropriate  Neurologic: Nonfocal  Labs on Admission:  Basic Metabolic Panel:  Lab 03/31/12 4098  NA 136  K 4.0  CL 99  CO2 25  GLUCOSE 158*  BUN 23  CREATININE 1.42*  CALCIUM 9.6  MG --  PHOS --   Liver Function Tests:  Lab 03/31/12 2120  AST 38*  ALT 25  ALKPHOS 119*  BILITOT 0.6  PROT 8.2  ALBUMIN 3.2*    Lab 03/31/12 2120  LIPASE 22  AMYLASE --   No results found for this basename: AMMONIA:5 in the last 168 hours CBC:  Lab 03/31/12 2120  WBC 11.7*  NEUTROABS 8.3*  HGB 12.2*  HCT 37.2*  MCV 93.2  PLT 199   Cardiac Enzymes:  Lab 03/31/12 2120  CKTOTAL --  CKMB --  CKMBINDEX --  TROPONINI <0.30    BNP (last 3 results)  Basename 03/31/12 2120 12/22/11 0600 12/18/11 0955  PROBNP 8718.0* 4724.0* 4560.0*   CBG:  Lab 03/31/12 2112  GLUCAP 149*    Radiological Exams on Admission: Dg Chest 2 View  03/31/2012  *RADIOLOGY REPORT*  Clinical Data: Shortness  of breath, vomiting, altered mental status  CHEST - 2 VIEW  Comparison: 02/09/2012  Findings: Chronic interstitial markings.  No focal consolidation. No pleural effusion or pneumothorax.  Stable mild cardiomegaly.  Degenerative changes of the visualized thoracolumbar spine.  IMPRESSION: No evidence of acute cardiopulmonary disease.   Original Report Authenticated By: Charline Bills, M.D.     EKG: Multifocal atrial tachycardia.  Assessment/Plan Healthcare-associated pneumonia/ Leukocytosis/Nausea and vomiting: - Patient presents with cough shortness of breath nausea vomiting and leukocytosis. As the chest x-ray does not does not show an infiltrate. The patient appears in intravascularly depleted by physical exam, which is probably why she is not showing an infiltrate on chest x-ray. He was discharged from the hospital on September 2013. So we'll start treating him for healthcare associated pneumonia with vancomycin, cefepime and Levaquin. We'll get sputum cultures and  blood cultures. We'll admit him to the step dow unit and monitor his saturations and blood pressure closely. He does have a lactic acidosis 3.5. This could be secondary to his pneumonia.   - His UA this should show Many white blood cells and many bacteria. He is not complaining of any dysuria. But it is unlikely urinary tract infection.  - We'll continue Zofran for his nausea. His electrodes are within normal limits.   Hypertension: - Will have him continue his beta blocker. We'll hold his diuretic. At this time his blood pressure seems to be stable. But his lactic acid is high.   DM2 (diabetes mellitus, type 2) - At home he is on 7030. I will stop this. We'll start him on Lantus plus sliding scale insulin and check CBGs a.c. and at bedtime  CKD (chronic kidney disease) stage 3, GFR 30-59 ml/min - Ending at baseline. Continue to monitor.  LA thrombus - His INR subtherapeutic. Localized heparin per pharmacy, and we'll continue  Coumadin to be dosed by pharmacy.  Protein malnutrition: His albumin is 3.2 and sugar after hydration will drop below 3. We'll start him on Ensure 3 times a day.  Multifocal atrial tachycardia: - Multifocal atrial tachycardia usually happens because of a hypoxic or pneumonic process. He is not hypoxic here in the emergency room. His saturations have remained above 95% on room air. We'll continue his metoprolol. He's only mildly tachycardic at 103-108. Once his stressor is resolved his tachycardia will improve.  Chronic systolic heart failure: - Seems to be compensated, although his BMP was significantly elevated. This elevation in BNP could be secondary to his pneumonia. He does not have bilateral crackles on physical exam, no JVD, in the lower chin the edema. His chest x-ray is not compatible with interstitial edema or pulmonary vascular congestion. He'll continue his beta blocker. As he seems to be dehydrated by physical exam we'll hold his Lasix and resume once he he is Euvolemic.   Code Status: full Family Communication: daughter Disposition Plan: home 3-5 days (indicate anticipated LOS)  Time spent: [address] Rosine Beat [facility] Pager [phone]  If 7PM-7AM, please contact night-coverage [email or web address] Password [facility] 03/31/2012, 11:57 PM

## 2012-03-31 NOTE — ED Notes (Signed)
Bed:WA11<BR> Expected date:<BR> Expected time:<BR> Means of arrival:<BR> Comments:<BR> EMS

## 2012-03-31 NOTE — ED Notes (Signed)
Brought in by [facility] from home with c/o abdominal pain with N/V/D x 3 days now.  Per EMS report, pt's family member reported that pt has become progressively weak and "confused".  Pt presents to ED A/O---answers questions appropriately.

## 2012-03-31 NOTE — ED Notes (Signed)
Pt reports also having recent falls at 0700 this AM

## 2012-03-31 NOTE — ED Notes (Signed)
Pt reportts he has been sick over last 3-5 days with intermittant nausea vomiting and diarrhea. Currently looks very pale and weak and having dry heavies Zofran given per EMS but iv site lost shortly after meds given

## 2012-03-31 NOTE — Progress Notes (Signed)
ANTIBIOTIC CONSULT NOTE - INITIAL  Pharmacy Consult for vancomycin/abx monitoring Indication: pneumonia  No Known Allergies  Patient Measurements: Height: 5\' 9"  (175.3 cm) Weight: 240 lb (108.863 kg) IBW/kg (Calculated) : 70.7  Adjusted Body Weight:   Vital Signs: Temp: 101.7 F (38.7 C) (12/17 2237) Temp src: Rectal (12/17 2237) BP: 159/89 mmHg (12/17 2101) Pulse Rate: 89  (12/17 2101) Intake/Output from previous day:   Intake/Output from this shift:    Labs:  [facility] 03/31/12 2120  WBC 11.7*  HGB 12.2*  PLT 199  LABCREA --  CREATININE 1.42*   Estimated Creatinine Clearance: 55.5 ml/min (by C-G formula based on Cr of 1.42). No results found for this basename: VANCOTROUGH:2,VANCOPEAK:2,VANCORANDOM:2,GENTTROUGH:2,GENTPEAK:2,GENTRANDOM:2,TOBRATROUGH:2,TOBRAPEAK:2,TOBRARND:2,AMIKACINPEAK:2,AMIKACINTROU:2,AMIKACIN:2, in the last 72 hours   Microbiology: No results found for this or any previous visit (from the past 720 hour(s)).  Medical History: Past Medical History  Diagnosis Date  . Diabetes mellitus   . Hypertension   . CHF (congestive heart failure)   . Cancer   . Pneumonia   . Irregular heartbeat   . Prostate cancer   . Shortness of breath   . GERD (gastroesophageal reflux disease)   . Acute on chronic diastolic CHF (congestive heart failure) 12/18/2011  . Atrial fibrillation with rapid ventricular response, history of PAF was in SR in 08/2011 12/18/2011  . CKD (chronic kidney disease) stage 3, GFR 30-59 ml/min 12/26/2011    Medications:  Anti-infectives     Start     Dose/Rate Route Frequency Ordered Stop   03/31/12 2345   ceFEPIme (MAXIPIME) 1 g in dextrose 5 % 50 mL IVPB        1 g 100 mL/hr over 30 Minutes Intravenous 3 times per day 03/31/12 2303 04/08/12 2159   03/31/12 2330   vancomycin (VANCOCIN) 1,500 mg in sodium chloride 0.9 % 500 mL IVPB        1,500 mg 250 mL/hr over 120 Minutes Intravenous  Once 03/31/12 2324     03/31/12 2315    levofloxacin (LEVAQUIN) IVPB 750 mg        750 mg 100 mL/hr over 90 Minutes Intravenous Every 24 hours 03/31/12 2303 04/03/12 2314         Assessment: Patient with PNA.  First dose of antibiotics already given in ED.  Goal of Therapy:  Vancomycin trough level 15-20 mcg/ml  Plan:  Measure antibiotic drug levels at steady state Follow up culture results Vancomycin 1500mg  iv q24hr  Aleene Davidson Crowford 03/31/2012,11:34 PM

## 2012-04-01 DIAGNOSIS — N390 Urinary tract infection, site not specified: Secondary | ICD-10-CM

## 2012-04-01 LAB — COMPREHENSIVE METABOLIC PANEL
ALT: 23 U/L (ref 0–53)
AST: 28 U/L (ref 0–37)
Albumin: 2.9 g/dL — ABNORMAL LOW (ref 3.5–5.2)
Alkaline Phosphatase: 104 U/L (ref 39–117)
Calcium: 9.2 mg/dL (ref 8.4–10.5)
GFR calc Af Amer: 52 mL/min — ABNORMAL LOW (ref >90)
Glucose, Bld: 84 mg/dL (ref 70–99)
Potassium: 3.9 mEq/L (ref 3.5–5.1)
Sodium: 135 mEq/L (ref 135–145)
Total Protein: 7.7 g/dL (ref 6.0–8.3)

## 2012-04-01 LAB — PROTIME-INR: INR: 1.27 (ref 0.00–1.49)

## 2012-04-01 LAB — CBC
Hemoglobin: 11.6 g/dL — ABNORMAL LOW (ref 13.0–17.0)
MCH: 30.4 pg (ref 26.0–34.0)
RBC: 3.82 MIL/uL — ABNORMAL LOW (ref 4.22–5.81)
WBC: 8.4 10*3/uL (ref 4.0–10.5)

## 2012-04-01 LAB — HEPARIN LEVEL (UNFRACTIONATED): Heparin Unfractionated: 0.9 IU/mL — ABNORMAL HIGH (ref 0.30–0.70)

## 2012-04-01 LAB — GLUCOSE, CAPILLARY
Glucose-Capillary: 87 mg/dL (ref 70–99)
Glucose-Capillary: 73 mg/dL (ref 70–99)
Glucose-Capillary: 87 mg/dL (ref 70–99)

## 2012-04-01 LAB — EXPECTORATED SPUTUM ASSESSMENT W GRAM STAIN, RFLX TO RESP C

## 2012-04-01 LAB — STREP PNEUMONIAE URINARY ANTIGEN: Strep Pneumo Urinary Antigen: NEGATIVE

## 2012-04-01 LAB — PROCALCITONIN: Procalcitonin: <0.1 ng/mL

## 2012-04-01 LAB — MRSA PCR SCREENING: MRSA by PCR: NEGATIVE

## 2012-04-01 MED ORDER — ENSURE PUDDING PO PUDG
1.0000 | Freq: Three times a day (TID) | ORAL | Status: DC
  Administered 2012-04-02 – 2012-04-06 (×10): 1 via ORAL
  Filled 2012-04-01 (×18): qty 1

## 2012-04-01 MED ORDER — WARFARIN SODIUM 6 MG PO TABS
6.0000 mg | ORAL_TABLET | Freq: Once | ORAL | Status: AC
  Administered 2012-04-01: 6 mg via ORAL
  Filled 2012-04-01 (×2): qty 1

## 2012-04-01 MED ORDER — LEVOFLOXACIN IN D5W 750 MG/150ML IV SOLN
750.0000 mg | Freq: Every day | INTRAVENOUS | Status: AC
  Administered 2012-04-01 – 2012-04-02 (×2): 750 mg via INTRAVENOUS
  Filled 2012-04-01 (×2): qty 150

## 2012-04-01 MED ORDER — POLYETHYLENE GLYCOL 3350 17 G PO PACK
17.0000 g | PACK | Freq: Every day | ORAL | Status: DC | PRN
  Filled 2012-04-01: qty 1

## 2012-04-01 MED ORDER — SODIUM CHLORIDE 0.9 % IJ SOLN
3.0000 mL | Freq: Two times a day (BID) | INTRAMUSCULAR | Status: DC
  Administered 2012-04-02 – 2012-04-06 (×6): 3 mL via INTRAVENOUS

## 2012-04-01 MED ORDER — ACETAMINOPHEN 325 MG PO TABS: 650.0000 mg | ORAL_TABLET | Freq: Four times a day (QID) | ORAL | Status: DC | PRN

## 2012-04-01 MED ORDER — DILTIAZEM HCL ER BEADS 300 MG PO CP24
300.0000 mg | ORAL_CAPSULE | Freq: Every morning | ORAL | Status: DC
  Administered 2012-04-01 – 2012-04-06 (×6): 300 mg via ORAL
  Filled 2012-04-01 (×6): qty 1

## 2012-04-01 MED ORDER — SENNOSIDES-DOCUSATE SODIUM 8.6-50 MG PO TABS
1.0000 | ORAL_TABLET | Freq: Every day | ORAL | Status: DC | PRN
  Administered 2012-04-04: 1 via ORAL
  Filled 2012-04-01: qty 1

## 2012-04-01 MED ORDER — ALLOPURINOL 150 MG HALF TABLET
150.0000 mg | ORAL_TABLET | Freq: Every morning | ORAL | Status: DC
  Administered 2012-04-01 – 2012-04-06 (×6): 150 mg via ORAL
  Filled 2012-04-01 (×6): qty 1

## 2012-04-01 MED ORDER — HEPARIN (PORCINE) IN NACL 100-0.45 UNIT/ML-% IJ SOLN
1100.0000 [IU]/h | INTRAMUSCULAR | Status: DC
  Filled 2012-04-01: qty 250

## 2012-04-01 MED ORDER — SODIUM CHLORIDE 0.9 % IV SOLN
INTRAVENOUS | Status: AC
  Administered 2012-04-01: 01:00:00 via INTRAVENOUS

## 2012-04-01 MED ORDER — INSULIN ASPART 100 UNIT/ML ~~LOC~~ SOLN
3.0000 [IU] | Freq: Three times a day (TID) | SUBCUTANEOUS | Status: DC
  Administered 2012-04-02 – 2012-04-04 (×7): 3 [IU] via SUBCUTANEOUS

## 2012-04-01 MED ORDER — ATENOLOL 100 MG PO TABS
100.0000 mg | ORAL_TABLET | Freq: Two times a day (BID) | ORAL | Status: DC
  Administered 2012-04-01 – 2012-04-02 (×4): 100 mg via ORAL
  Filled 2012-04-01 (×7): qty 1

## 2012-04-01 MED ORDER — VANCOMYCIN HCL 10 G IV SOLR
1500.0000 mg | Freq: Every day | INTRAVENOUS | Status: DC
  Administered 2012-04-01 – 2012-04-02 (×2): 1500 mg via INTRAVENOUS
  Filled 2012-04-01 (×2): qty 1500

## 2012-04-01 MED ORDER — HEPARIN (PORCINE) IN NACL 100-0.45 UNIT/ML-% IJ SOLN
15.5000 [IU]/kg/h | INTRAMUSCULAR | Status: DC
  Administered 2012-04-01 (×3): 15.5 [IU]/kg/h via INTRAVENOUS
  Filled 2012-04-01 (×3): qty 250

## 2012-04-01 MED ORDER — INSULIN GLARGINE 100 UNIT/ML ~~LOC~~ SOLN
15.0000 [IU] | Freq: Every day | SUBCUTANEOUS | Status: DC
  Administered 2012-04-01 (×2): 15 [IU] via SUBCUTANEOUS

## 2012-04-01 MED ORDER — DILTIAZEM HCL 100 MG IV SOLR
5.0000 mg/h | INTRAVENOUS | Status: DC
  Administered 2012-04-01: 5 mg/h via INTRAVENOUS
  Filled 2012-04-01: qty 100

## 2012-04-01 MED ORDER — INSULIN ASPART 100 UNIT/ML ~~LOC~~ SOLN
0.0000 [IU] | Freq: Three times a day (TID) | SUBCUTANEOUS | Status: DC
  Administered 2012-04-02 (×2): 2 [IU] via SUBCUTANEOUS
  Administered 2012-04-03: 1 [IU] via SUBCUTANEOUS
  Administered 2012-04-03: 2 [IU] via SUBCUTANEOUS
  Administered 2012-04-04: 7 [IU] via SUBCUTANEOUS
  Administered 2012-04-04: 3 [IU] via SUBCUTANEOUS
  Administered 2012-04-04: 2 [IU] via SUBCUTANEOUS
  Administered 2012-04-05 – 2012-04-06 (×5): 9 [IU] via SUBCUTANEOUS

## 2012-04-01 MED ORDER — INSULIN ASPART 100 UNIT/ML ~~LOC~~ SOLN
0.0000 [IU] | Freq: Every day | SUBCUTANEOUS | Status: DC
  Administered 2012-04-04: 5 [IU] via SUBCUTANEOUS
  Administered 2012-04-05: 7 [IU] via SUBCUTANEOUS

## 2012-04-01 MED ORDER — PROMETHAZINE HCL 25 MG/ML IJ SOLN
25.0000 mg | Freq: Four times a day (QID) | INTRAMUSCULAR | Status: DC | PRN
  Administered 2012-04-01: 25 mg via INTRAVENOUS
  Filled 2012-04-01: qty 1

## 2012-04-01 MED ORDER — ONDANSETRON HCL 4 MG PO TABS
4.0000 mg | ORAL_TABLET | Freq: Four times a day (QID) | ORAL | Status: DC | PRN
  Administered 2012-04-04: 4 mg via ORAL
  Filled 2012-04-01: qty 1

## 2012-04-01 MED ORDER — LEVOFLOXACIN IN D5W 750 MG/150ML IV SOLN: 750.0000 mg | INTRAVENOUS | Status: DC

## 2012-04-01 MED ORDER — HEPARIN BOLUS VIA INFUSION
3000.0000 [IU] | Freq: Once | INTRAVENOUS | Status: AC
  Administered 2012-04-01: 3000 [IU] via INTRAVENOUS
  Filled 2012-04-01: qty 3000

## 2012-04-01 MED ORDER — ONDANSETRON HCL 4 MG/2ML IJ SOLN
4.0000 mg | Freq: Four times a day (QID) | INTRAMUSCULAR | Status: DC | PRN
  Administered 2012-04-01 – 2012-04-05 (×3): 4 mg via INTRAVENOUS
  Filled 2012-04-01 (×3): qty 2

## 2012-04-01 MED ORDER — ACETAMINOPHEN 650 MG RE SUPP: 650.0000 mg | Freq: Four times a day (QID) | RECTAL | Status: DC | PRN

## 2012-04-01 MED ORDER — DEXTROSE 5 % IV SOLN
1.0000 g | Freq: Three times a day (TID) | INTRAVENOUS | Status: DC
  Administered 2012-04-01 – 2012-04-03 (×8): 1 g via INTRAVENOUS
  Filled 2012-04-01 (×10): qty 1

## 2012-04-01 MED ORDER — BIOTENE DRY MOUTH MT LIQD
15.0000 mL | Freq: Two times a day (BID) | OROMUCOSAL | Status: DC
  Administered 2012-04-01 – 2012-04-06 (×9): 15 mL via OROMUCOSAL

## 2012-04-01 MED ORDER — WARFARIN SODIUM 5 MG PO TABS
5.0000 mg | ORAL_TABLET | Freq: Once | ORAL | Status: DC
  Filled 2012-04-01: qty 1

## 2012-04-01 MED ORDER — WARFARIN - PHARMACIST DOSING INPATIENT: Freq: Every day | Status: DC

## 2012-04-01 NOTE — Progress Notes (Signed)
ANTICOAGULATION CONSULT NOTE - Follow Up Consult  Pharmacy Consult for IV heparin, Coumadin Indication: h/o atrial fibrillation and L atrial thrombus  No Known Allergies  Patient Measurements: Height: 5\' 9"  (175.3 cm) Weight: 246 lb 0.5 oz (111.6 kg) IBW/kg (Calculated) : 70.7   Vital Signs: Temp: 97.3 F (36.3 C) (12/18 0800) Temp src: Oral (12/18 0800) BP: 143/92 mmHg (12/18 0910) Pulse Rate: 126  (12/18 0910)  Labs:  Basename 04/01/12 0944 04/01/12 0255 03/31/12 2120  HGB -- 11.6* 12.2*  HCT -- 35.6* 37.2*  PLT -- 216 199  APTT -- -- --  LABPROT -- 15.6* 15.7*  INR -- 1.27 1.28  HEPARINUNFRC 0.67 -- --  CREATININE -- 1.47* 1.42*  CKTOTAL -- -- --  CKMB -- -- --  TROPONINI -- -- <0.30    Estimated Creatinine Clearance: 54.3 ml/min (by C-G formula based on Cr of 1.47).   Assessment:  76 yom on Coumadin PTA for h/o L atrial thrombus and history of atrial fibrillation. INR subtherapeutic (1.28) on admit, MD resuming Coumadin along with IV heparin as bridge. Pt reportedly on Coumadin 4mg /day with last dose 12/17.    IV heparin currently running at 1350 units/hr and first heparin level therapeutic at 0.67. Coumadin 5mg  x 1 ordered for 1800 today.   Scr 1.47 (patient with h/o CKD stage III) - CG CrCl 54 ml/min.  H/H and platelets okay.  No bleeding/complications from anticoagulation therapy documented.  Drug-drug interaction with Levaquin noted: pharmacy will monitor closely.  Goal of Therapy:  INR 2-3 Heparin level 0.3-0.7 units/ml Monitor platelets by anticoagulation protocol: Yes   Plan:   Continue IV heparin at 1350 units/hr  Recheck confirmatory heparin level at 1800 tonight  Change Coumadin to 6mg  po x 1 to be given at noon today per protocol.   Daily Heparin level, CBC and PT/INR  Pharmacy will f/u  Geoffry Paradise, PharmD, BCPS Pager: [phone] 10:37 AM Pharmacy #: [phone]

## 2012-04-01 NOTE — Progress Notes (Signed)
Spoke with patient's daughter re: PICC insertion.  Daughter is concerned about patient's renal issues and wants to talk with cardiologist before giving consent for procedure.  I started a peripheral IV so patient could receive his IV medications and will await decision by daughter as to PICC placement.  Notified staff RN

## 2012-04-01 NOTE — Progress Notes (Signed)
ANTICOAGULATION CONSULT NOTE - Initial Consult  Pharmacy Consult for heparin/warfarin Indication: atrial fibrillation and DVT  No Known Allergies  Patient Measurements: Height: 5\' 9"  (175.3 cm) Weight: 246 lb 0.5 oz (111.6 kg) IBW/kg (Calculated) : 70.7  Heparin Dosing Weight:   Vital Signs: Temp: 100.3 F (37.9 C) (12/18 0007) Temp src: Oral (12/18 0007) BP: 122/66 mmHg (12/18 0110) Pulse Rate: 78  (12/18 0200)  Labs:  Basename 03/31/12 2120  HGB 12.2*  HCT 37.2*  PLT 199  APTT --  LABPROT 15.7*  INR 1.28  HEPARINUNFRC --  CREATININE 1.42*  CKTOTAL --  CKMB --  TROPONINI <0.30    Estimated Creatinine Clearance: 56.2 ml/min (by C-G formula based on Cr of 1.42).   Medical History: Past Medical History  Diagnosis Date  . Diabetes mellitus   . Hypertension   . CHF (congestive heart failure)   . Cancer   . Pneumonia   . Irregular heartbeat   . Prostate cancer   . Shortness of breath   . GERD (gastroesophageal reflux disease)   . Acute on chronic diastolic CHF (congestive heart failure) 12/18/2011  . Atrial fibrillation with rapid ventricular response, history of PAF was in SR in 08/2011 12/18/2011  . CKD (chronic kidney disease) stage 3, GFR 30-59 ml/min 12/26/2011    Medications:  Prescriptions prior to admission  Medication Sig Dispense Refill  . allopurinol (ZYLOPRIM) 100 MG tablet Take 150 mg by mouth every morning.       Marland Kitchen atenolol (TENORMIN) 100 MG tablet Take 100 mg by mouth 2 (two) times daily.      Marland Kitchen diltiazem (TIAZAC) 300 MG 24 hr capsule Take 300 mg by mouth every morning.       . furosemide (LASIX) 80 MG tablet Take 0.5 tablets (40 mg total) by mouth 2 (two) times daily.  30 tablet  0  . insulin NPH-insulin regular (NOVOLIN 70/30) (70-30) 100 UNIT/ML injection Inject 20-50 Units into the skin 2 (two) times daily with a meal. Takes 50 units in the am & 20 units in the pm      . polyethylene glycol (MIRALAX / GLYCOLAX) packet Take 17 g by mouth daily as  needed. For constipation      . potassium chloride (K-DUR,KLOR-CON) 10 MEQ tablet Take 20 mEq by mouth 2 (two) times daily.       Marland Kitchen senna-docusate (SENOKOT-S) 8.6-50 MG per tablet Take 1 tablet by mouth daily as needed. For constipation      . warfarin (COUMADIN) 4 MG tablet Take 4 mg by mouth every morning.        Infusions:    . sodium chloride 75 mL/hr at 04/01/12 0110  . heparin 15.5 Units/kg/hr (04/01/12 0134)  . [DISCONTINUED] sodium chloride 75 mL/hr at 03/31/12 2324    Assessment: Patient with afib and chronic warfarin use.  Heparin is ordered per pharmacy for DVT.  INR < 2 on admit.  Goal of Therapy:  INR 2-3 Heparin level 0.3-0.7 units/ml Monitor platelets by anticoagulation protocol: Yes   Plan:  Daily INR, CBC, Heparin level Heparin level ~8hr after drip starts Heparin bolus 3000 units iv x1 Heparin drip 1350 units/hr Warfarin 5mg  po x1 at [address], Jacquenette Shone Crowford 04/01/2012,2:27 AM

## 2012-04-01 NOTE — Progress Notes (Signed)
Patient and family member refused PICC line.

## 2012-04-01 NOTE — Progress Notes (Signed)
[id number]/Pailyn Bellevue, RN, BSN, CCM: CHART REVIEWED AND UPDATED.  Next chart review due on [id number]. NO DISCHARGE NEEDS PRESENT AT THIS TIME. CASE MANAGEMENT [phone] 

## 2012-04-01 NOTE — Progress Notes (Signed)
[facility] PROGRESS NOTE  Vincent [name]:[id number] DOB: 05-04-1935 DOA: 03/31/2012 PCP: [name], MD  Brief narrative: [name] is a 76 year old man with a past medical history of chronic systolic heart failure, EF of 81%, and left atrial thrombus on chronic Coumadin therapy, atrial fibrillation, diabetes, stage III chronic kidney disease recently hospitalized 12/2011 for coagulase-negative staph bacteremia, TEE negative, who presented to the hospital on 03/31/2012 with intractable nausea and vomiting as well as cough. He was also noted to be febrile upon initial presentation.  Assessment/Plan: Principal Problem:  *Healthcare-associated pneumonia  Admitted and placed on broad-spectrum antibiotics including vancomycin, cefepime, and Levaquin.  Followup blood and sputum cultures, strep and Legionella urinary antigens and HIV antibody. Active Problems:  Hypertension  Continue current therapy. On a beta blocker. Diuretics on hold. Blood pressure stable.  DM2 (diabetes mellitus, type 2)  Controlled on 15 units of Lantus daily as well as insulin sensitive sliding scale and 3 units of meal coverage.  Followup hemoglobin A1c.  Leukocytosis  Normalized with initiation of antibiotics.  CKD (chronic kidney disease) stage 3, GFR 30-59 ml/min  Baseline creatinine 1.5. Current creatinine usual baseline values.  LA thrombus  Continue heparin and Coumadin per pharmacy protocol.  Nausea and vomiting  Continue anti-nausea medications as needed.  Multifocal atrial tachycardia  Continue beta blocker.  Chronic systolic heart failure  Pro BNP elevated but no evidence of decompensated heart failure upon initial presentation. Watch fluid balance closely and reinitiate diuretics when indicated.  Code Status: full  Family Communication: daughter  Disposition Plan: home when stable.    Medical Consultants:  None.  Other  Consultants:  None.  Anti-infectives:  Cefepime 03/31/2012--->  Vancomycin 03/31/2012--->  Levaquin 03/31/2012--->  HPI/Subjective: Mr. Wisler has had some nausea and vomiting overnight. No diarrhea. Ongoing cough. Febrile. No dyspnea.  Objective: Filed Vitals:   04/01/12 0007 04/01/12 0110 04/01/12 0200 04/01/12 0505  BP: 132/67 122/66  116/67  Pulse: 88 79 78 63  Temp: 100.3 F (37.9 C) 98.6 F (37 C)    TempSrc: Oral Oral    Resp:  15 20 26   Height:  5\' 9"  (1.753 m)    Weight:  111.6 kg (246 lb 0.5 oz)    SpO2: 97% 100% 100% 95%    Intake/Output Summary (Last 24 hours) at 04/01/12 1914 Last data filed at 04/01/12 0600  Gross per 24 hour  Intake  971.5 ml  Output      0 ml  Net  971.5 ml    Exam: Gen:  NAD Cardiovascular:  Heart sounds irregular Respiratory:  Lungs diminished with occasional rhonchi Gastrointestinal:  Abdomen soft, NT/ND, + BS Extremities:  No C/E/C  Data Reviewed: Basic Metabolic Panel:  Lab 04/01/12 7829 03/31/12 2120  NA 135 136  K 3.9 4.0  CL 99 99  CO2 29 25  GLUCOSE 84 158*  BUN 22 23  CREATININE 1.47* 1.42*  CALCIUM 9.2 9.6  MG -- --  PHOS -- --   GFR Estimated Creatinine Clearance: 54.3 ml/min (by C-G formula based on Cr of 1.47). Liver Function Tests:  Lab 04/01/12 0255 03/31/12 2120  AST 28 38*  ALT 23 25  ALKPHOS 104 119*  BILITOT 0.6 0.6  PROT 7.7 8.2  ALBUMIN 2.9* 3.2*    Lab 03/31/12 2120  LIPASE 22  AMYLASE --   Coagulation profile  Lab 04/01/12 0255 03/31/12 2120  INR 1.27 1.28  PROTIME -- --    CBC:  Lab 04/01/12 0255 03/31/12 2120  WBC 8.4 11.7*  NEUTROABS -- 8.3*  HGB 11.6* 12.2*  HCT 35.6* 37.2*  MCV 93.2 93.2  PLT 216 199   Cardiac Enzymes:  Lab 03/31/12 2120  CKTOTAL --  CKMB --  CKMBINDEX --  TROPONINI <0.30   BNP (last 3 results)  Basename 03/31/12 2120 12/22/11 0600 12/18/11 0955  PROBNP 8718.0* 4724.0* 4560.0*   CBG:  Lab 04/01/12 0117 03/31/12 2112  GLUCAP 87  149*   Microbiology Recent Results (from the past 240 hour(s))  MRSA PCR SCREENING     Status: Normal   Collection Time   04/01/12  1:19 AM      Component Value Range Status Comment   MRSA by PCR NEGATIVE  NEGATIVE Final      Procedures and Diagnostic Studies: Dg Chest 2 View  03/31/2012  *RADIOLOGY REPORT*  Clinical Data: Shortness of breath, vomiting, altered mental status  CHEST - 2 VIEW  Comparison: 02/09/2012  Findings: Chronic interstitial markings.  No focal consolidation. No pleural effusion or pneumothorax.  Stable mild cardiomegaly.  Degenerative changes of the visualized thoracolumbar spine.  IMPRESSION: No evidence of acute cardiopulmonary disease.   Original Report Authenticated By: Charline Bills, M.D.     Scheduled Meds:   . allopurinol  150 mg Oral q morning - 10a  . antiseptic oral rinse  15 mL Mouth Rinse BID  . atenolol  100 mg Oral BID  . ceFEPime (MAXIPIME) IV  1 g Intravenous Q8H  . diltiazem  300 mg Oral q morning - 10a  . feeding supplement  1 Container Oral TID BM  . insulin aspart  0-5 Units Subcutaneous QHS  . insulin aspart  0-9 Units Subcutaneous TID WC  . insulin aspart  3 Units Subcutaneous TID WC  . insulin glargine  15 Units Subcutaneous QHS  . levofloxacin (LEVAQUIN) IV  750 mg Intravenous QHS  . sodium chloride  3 mL Intravenous Q12H  . vancomycin  1,500 mg Intravenous QHS  . warfarin  5 mg Oral ONCE-1800  . Warfarin - Pharmacist Dosing Inpatient   Does not apply q1800   Continuous Infusions:   . sodium chloride 75 mL/hr at 04/01/12 0110  . heparin 15.5 Units/kg/hr (04/01/12 0600)    Time spent: 35 minutes.   LOS: 1 day   [name]  [facility] Pager [phone].  If 8PM-8AM, please contact night-coverage at [email or web address], password [facility] 04/01/2012, 7:02 AM

## 2012-04-01 NOTE — Progress Notes (Signed)
ANTIBIOTIC CONSULT NOTE - INITIAL  Pharmacy Consult for vancomycin/abx monitoring Indication: pneumonia  No Known Allergies  Patient Measurements: Height: 5\' 9"  (175.3 cm) Weight: 246 lb 0.5 oz (111.6 kg) IBW/kg (Calculated) : 70.7  Adjusted Body Weight:   Vital Signs: Temp: 98.6 F (37 C) (12/18 0110) Temp src: Oral (12/18 0110) BP: 122/66 mmHg (12/18 0110) Pulse Rate: 78  (12/18 0200) Intake/Output from previous day: 12/17 0701 - 12/18 0700 In: 629 [I.V.:75; IV Piggyback:554] Out: -  Intake/Output from this shift: Total I/O In: 629 [I.V.:75; IV Piggyback:554] Out: -   Labs:  [facility] 03/31/12 2120  WBC 11.7*  HGB 12.2*  PLT 199  LABCREA --  CREATININE 1.42*   Estimated Creatinine Clearance: 56.2 ml/min (by C-G formula based on Cr of 1.42). No results found for this basename: VANCOTROUGH:2,VANCOPEAK:2,VANCORANDOM:2,GENTTROUGH:2,GENTPEAK:2,GENTRANDOM:2,TOBRATROUGH:2,TOBRAPEAK:2,TOBRARND:2,AMIKACINPEAK:2,AMIKACINTROU:2,AMIKACIN:2, in the last 72 hours   Microbiology: No results found for this or any previous visit (from the past 720 hour(s)).  Medical History: Past Medical History  Diagnosis Date  . Diabetes mellitus   . Hypertension   . CHF (congestive heart failure)   . Cancer   . Pneumonia   . Irregular heartbeat   . Prostate cancer   . Shortness of breath   . GERD (gastroesophageal reflux disease)   . Acute on chronic diastolic CHF (congestive heart failure) 12/18/2011  . Atrial fibrillation with rapid ventricular response, history of PAF was in SR in 08/2011 12/18/2011  . CKD (chronic kidney disease) stage 3, GFR 30-59 ml/min 12/26/2011    Medications:  Anti-infectives     Start     Dose/Rate Route Frequency Ordered Stop   04/01/12 2200   vancomycin (VANCOCIN) 1,500 mg in sodium chloride 0.9 % 500 mL IVPB  Status:  Discontinued        1,500 mg 250 mL/hr over 120 Minutes Intravenous Daily at bedtime 03/31/12 2336 04/01/12 0036   04/01/12 2200    levofloxacin (LEVAQUIN) IVPB 750 mg        750 mg 100 mL/hr over 90 Minutes Intravenous Daily at bedtime 04/01/12 0056 04/04/12 2159   04/01/12 2200   vancomycin (VANCOCIN) 1,500 mg in sodium chloride 0.9 % 500 mL IVPB        1,500 mg 250 mL/hr over 120 Minutes Intravenous Daily at bedtime 04/01/12 0058     04/01/12 0045   ceFEPIme (MAXIPIME) 1 g in dextrose 5 % 50 mL IVPB        1 g 100 mL/hr over 30 Minutes Intravenous 3 times per day 04/01/12 0036 04/08/12 2159   04/01/12 0045   levofloxacin (LEVAQUIN) IVPB 750 mg  Status:  Discontinued        750 mg 100 mL/hr over 90 Minutes Intravenous Every 24 hours 04/01/12 0036 04/01/12 0056   03/31/12 2345   ceFEPIme (MAXIPIME) 1 g in dextrose 5 % 50 mL IVPB  Status:  Discontinued        1 g 100 mL/hr over 30 Minutes Intravenous 3 times per day 03/31/12 2303 04/01/12 0036   03/31/12 2330   vancomycin (VANCOCIN) 1,500 mg in sodium chloride 0.9 % 500 mL IVPB  Status:  Discontinued        1,500 mg 250 mL/hr over 120 Minutes Intravenous  Once 03/31/12 2324 04/01/12 0036   03/31/12 2315   levofloxacin (LEVAQUIN) IVPB 750 mg  Status:  Discontinued        750 mg 100 mL/hr over 90 Minutes Intravenous Every 24 hours 03/31/12 2303 04/01/12 0036  Assessment: Patient with PNA.  First dose of antibiotics already given in ED.  Goal of Therapy:  Vancomycin trough level 15-20 mcg/ml  Plan:  Measure antibiotic drug levels at steady state Follow up culture results Vancomycin 1500mg  iv q24hr  Vincent [name], Vincent [name] 04/01/2012,2:40 AM

## 2012-04-01 NOTE — Progress Notes (Signed)
OT Cancellation Note  Patient Details Name: Vincent [name] MRN: [id number] DOB: [birth year]   Cancelled Treatment:    Reason Eval/Treat Not Completed: Medical issues which prohibited therapy.  Dorse Locy A OTR/L [phone] 04/01/2012, 1:49 PM

## 2012-04-01 NOTE — Progress Notes (Signed)
Patient has been referred for [facility] Care Management services prior to admission.  We have not been able to engage him after multiple attempts to reach him at home.  Patient's contact number has been confirmed.  Will follow up with patient to determine his desire to participate with services.  For additional questions or referrals please contact Anibal Henderson BSN RN [facility] Liaison at [phone].

## 2012-04-01 NOTE — ED Notes (Signed)
Floor Unit RN unavailable to take report on pt at this time. 

## 2012-04-01 NOTE — Progress Notes (Signed)
PT Cancellation Note  Patient Details Name: COLEY KULIKOWSKI MRN: [id number] DOB: 25-Sep-1935   Cancelled Treatment:    Reason Eval/Treat Not Completed: Patient not medically ready.  Per RN, pt is not stable from a cardiac standpoint at this time.  Will follow.    Thanks,    Page, Meribeth Mattes 04/01/2012, 2:16 PM

## 2012-04-01 NOTE — Progress Notes (Signed)
ANTICOAGULATION CONSULT NOTE - Follow Up Consult  Pharmacy Consult for IV heparin, Coumadin Indication: h/o atrial fibrillation and L atrial thrombus  No Known Allergies  Patient Measurements: Height: 5\' 9"  (175.3 cm) Weight: 246 lb 0.5 oz (111.6 kg) IBW/kg (Calculated) : 70.7   Vital Signs: Temp: 97.9 F (36.6 C) (12/18 1600) Temp src: Oral (12/18 1600) BP: 139/83 mmHg (12/18 1700) Pulse Rate: 79  (12/18 1700)  Labs:  Basename 04/01/12 1811 04/01/12 0944 04/01/12 0255 03/31/12 2120  HGB -- -- 11.6* 12.2*  HCT -- -- 35.6* 37.2*  PLT -- -- 216 199  APTT -- -- -- --  LABPROT -- -- 15.6* 15.7*  INR -- -- 1.27 1.28  HEPARINUNFRC 0.90* 0.67 -- --  CREATININE -- -- 1.47* 1.42*  CKTOTAL -- -- -- --  CKMB -- -- -- --  TROPONINI -- -- -- <0.30    Estimated Creatinine Clearance: 54.3 ml/min (by C-G formula based on Cr of 1.47).   Assessment:  76 yom on Coumadin PTA for h/o L atrial thrombus and history of atrial fibrillation. INR subtherapeutic (1.28) on admit, MD resuming Coumadin along with IV heparin as bridge.  IV heparin currently running at 1350 units/hr, no bleeding/complications noted by RN.  Heparin level above goal at 0.9.    Goal of Therapy:  INR 2-3 Heparin level 0.3-0.7 units/ml Monitor platelets by anticoagulation protocol: Yes   Plan:   Decrease IV heparin to 1100 units/hr  Recheck heparin level in 8 hours.    Continue warfarin as ordered.   Daily Heparin level, CBC and PT/INR.  Pharmacy will f/u  Lynann Beaver PharmD, BCPS Pager [phone] 04/01/2012 7:27 PM

## 2012-04-01 NOTE — Plan of Care (Signed)
Problem: Phase I Progression Outcomes Goal: Voiding-avoid urinary catheter unless indicated Outcome: Completed/Met Date Met:  04/01/12 Condom cath

## 2012-04-02 DIAGNOSIS — N390 Urinary tract infection, site not specified: Secondary | ICD-10-CM | POA: Diagnosis present

## 2012-04-02 DIAGNOSIS — D649 Anemia, unspecified: Secondary | ICD-10-CM | POA: Diagnosis present

## 2012-04-02 LAB — GLUCOSE, CAPILLARY
Glucose-Capillary: 107 mg/dL — ABNORMAL HIGH (ref 70–99)
Glucose-Capillary: 184 mg/dL — ABNORMAL HIGH (ref 70–99)
Glucose-Capillary: 192 mg/dL — ABNORMAL HIGH (ref 70–99)

## 2012-04-02 LAB — HEPARIN LEVEL (UNFRACTIONATED)
Heparin Unfractionated: 0.58 IU/mL (ref 0.30–0.70)
Heparin Unfractionated: 0.38 IU/mL (ref 0.30–0.70)

## 2012-04-02 LAB — URINE CULTURE: Colony Count: >=100000

## 2012-04-02 LAB — CBC
MCH: 29.8 pg (ref 26.0–34.0)
MCHC: 31.5 g/dL (ref 30.0–36.0)
MCV: 94.5 fL (ref 78.0–100.0)
Platelets: 154 10*3/uL (ref 150–400)
RDW: 16.5 % — ABNORMAL HIGH (ref 11.5–15.5)

## 2012-04-02 LAB — PROTIME-INR: INR: 1.48 (ref 0.00–1.49)

## 2012-04-02 LAB — LEGIONELLA ANTIGEN, URINE

## 2012-04-02 MED ORDER — HEPARIN (PORCINE) IN NACL 100-0.45 UNIT/ML-% IJ SOLN
1000.0000 [IU]/h | INTRAMUSCULAR | Status: DC
  Administered 2012-04-03 – 2012-04-05 (×2): 1000 [IU]/h via INTRAVENOUS
  Filled 2012-04-02 (×5): qty 250

## 2012-04-02 MED ORDER — WARFARIN SODIUM 5 MG PO TABS
5.0000 mg | ORAL_TABLET | Freq: Once | ORAL | Status: AC
  Administered 2012-04-02: 5 mg via ORAL
  Filled 2012-04-02: qty 1

## 2012-04-02 MED ORDER — HEPARIN (PORCINE) IN NACL 100-0.45 UNIT/ML-% IJ SOLN
900.0000 [IU]/h | INTRAMUSCULAR | Status: DC
  Administered 2012-04-02: 900 [IU]/h via INTRAVENOUS
  Filled 2012-04-02 (×3): qty 250

## 2012-04-02 MED ORDER — INSULIN GLARGINE 100 UNIT/ML ~~LOC~~ SOLN
10.0000 [IU] | Freq: Every day | SUBCUTANEOUS | Status: DC
  Administered 2012-04-02: 10 [IU] via SUBCUTANEOUS

## 2012-04-02 MED ORDER — IPRATROPIUM BROMIDE 0.02 % IN SOLN
0.5000 mg | Freq: Four times a day (QID) | RESPIRATORY_TRACT | Status: DC | PRN
  Administered 2012-04-02: 0.5 mg via RESPIRATORY_TRACT
  Filled 2012-04-02: qty 2.5

## 2012-04-02 MED ORDER — LEVALBUTEROL HCL 0.63 MG/3ML IN NEBU
0.6300 mg | INHALATION_SOLUTION | Freq: Four times a day (QID) | RESPIRATORY_TRACT | Status: DC | PRN
  Administered 2012-04-02: 0.63 mg via RESPIRATORY_TRACT
  Filled 2012-04-02: qty 3

## 2012-04-02 NOTE — Progress Notes (Signed)
ANTICOAGULATION CONSULT NOTE - Follow Up Consult  Pharmacy Consult for IV heparin, Coumadin Indication: h/o atrial fibrillation and L atrial thrombus  No Known Allergies  Patient Measurements: Height: 5\' 9"  (175.3 cm) Weight: 263 lb 7.2 oz (119.5 kg) IBW/kg (Calculated) : 70.7   Vital Signs: Temp: 98.6 F (37 C) (12/19 2003) Temp src: Oral (12/19 2003) BP: 129/81 mmHg (12/19 2003) Pulse Rate: 64  (12/19 2003)  Labs:  Basename 04/02/12 2041 04/02/12 1257 04/02/12 0343 04/01/12 0255 03/31/12 2120  HGB -- -- 10.9* 11.6* --  HCT -- -- 34.6* 35.6* 37.2*  PLT -- -- 154 216 199  APTT -- -- -- -- --  LABPROT -- -- 17.5* 15.6* 15.7*  INR -- -- 1.48 1.27 1.28  HEPARINUNFRC 0.38 0.58 0.76* -- --  CREATININE -- -- -- 1.47* 1.42*  CKTOTAL -- -- -- -- --  CKMB -- -- -- -- --  TROPONINI -- -- -- -- <0.30    Estimated Creatinine Clearance: 56.2 ml/min (by C-G formula based on Cr of 1.47).   Assessment:  76 yom on Coumadin PTA for h/o L atrial thrombus and history of atrial fibrillation. INR subtherapeutic (1.28) on admit, MD resuming Coumadin along with IV heparin as bridge.  IV heparin currently running at 900 units/hr, no bleeding/complications noted by RN.  Heparin level remains therapeutic, but decreased (0.38)  Goal of Therapy:  INR 2-3 Heparin level 0.3-0.7 units/ml Monitor platelets by anticoagulation protocol: Yes   Plan:   Increase IV heparin to 1000 units/hr  Recheck heparin level with daily AM labs  Continue warfarin as ordered.   Daily Heparin level, CBC and PT/INR.  Pharmacy will f/u.  Lynann Beaver PharmD, BCPS Pager [phone] 04/02/2012 9:36 PM

## 2012-04-02 NOTE — Progress Notes (Signed)
ANTICOAGULATION CONSULT NOTE - Follow Up Consult  Pharmacy Consult for IV heparin, Coumadin Indication: h/o atrial fibrillation and L atrial thrombus  No Known Allergies  Patient Measurements: Height: 5\' 9"  (175.3 cm) Weight: 263 lb 7.2 oz (119.5 kg) IBW/kg (Calculated) : 70.7   Vital Signs: Temp: 97.8 F (36.6 C) (12/19 1200) Temp src: Oral (12/19 1200) BP: 131/87 mmHg (12/19 1200) Pulse Rate: 85  (12/19 1200)  Labs:  Basename 04/02/12 1257 04/02/12 0343 04/01/12 1811 04/01/12 0255 03/31/12 2120  HGB -- 10.9* -- 11.6* --  HCT -- 34.6* -- 35.6* 37.2*  PLT -- 154 -- 216 199  APTT -- -- -- -- --  LABPROT -- 17.5* -- 15.6* 15.7*  INR -- 1.48 -- 1.27 1.28  HEPARINUNFRC 0.58 0.76* 0.90* -- --  CREATININE -- -- -- 1.47* 1.42*  CKTOTAL -- -- -- -- --  CKMB -- -- -- -- --  TROPONINI -- -- -- -- <0.30    Estimated Creatinine Clearance: 56.2 ml/min (by C-G formula based on Cr of 1.47).   Assessment:  76 yom on Coumadin PTA for h/o L atrial thrombus and history of atrial fibrillation. INR subtherapeutic (1.28) on admit, MD resuming Coumadin along with IV heparin as bridge. Pt reportedly on Coumadin 4mg /day with last dose 12/17.    Heparin level previously elevated and Heparin now reduced to 900 units/hr.  First heparin level at this rate is therapeutic at 0.58 (goal 0.3 - 0.7).  INR today = 1.48 s/p 1 dose of Coumadin after resume.  Scr 1.47 (patient with h/o CKD stage III) - CG CrCl 54 ml/min.  Hgb down to 10.9, platelets okay.  No bleeding/complications from anticoagulation therapy documented.  Drug-drug interaction with Levaquin noted: pharmacy will monitor closely.  Levaquin will end today per HCAP protocol.   Goal of Therapy:  INR 2-3 Heparin level 0.3-0.7 units/ml Monitor platelets by anticoagulation protocol: Yes   Plan:   Continue IV heparin at 900 units/hr  Recheck confirmatory heparin level at 2100 tonight  Coumadin 5mg  po x 1 tonight    Daily Heparin  level, CBC and PT/INR  Pharmacy will f/u  Geoffry Paradise, PharmD, BCPS Pager: [phone] 1:31 PM Pharmacy #: [id number]

## 2012-04-02 NOTE — Progress Notes (Signed)
[facility] PROGRESS NOTE  Vincent [name] UJW:[id number] DOB: September 14, 1935 DOA: 03/31/2012 PCP: [name], MD  Brief narrative: Vincent [name] is a 76 year old man with a past medical history of chronic systolic heart failure, EF of 56%, and left atrial thrombus on chronic Coumadin therapy, atrial fibrillation, diabetes, stage III chronic kidney disease recently hospitalized 12/2011 for coagulase-negative staph bacteremia, TEE negative, who presented to the hospital on 03/31/2012 with intractable nausea and vomiting as well as cough. He was also noted to be febrile upon initial presentation.  Assessment/Plan: Principal Problem:  *Healthcare-associated pneumonia  Admitted and placed on broad-spectrum antibiotics including vancomycin, cefepime, and Levaquin.  Followup blood and sputum cultures, and Legionella urinary antigen.   HIV antibody and strep pneumo negative. Active Problems:  Normocytic anemia  Hemoglobin trending down, likely from AOCD.  UTI  Urine culture growing > 100,000 colonies GNR.  F/U final results.  On broad spectrum antibiotics.  Hypertension  Continue current therapy. On a beta blocker. Diuretics on hold. Blood pressure stable.  DM2 (diabetes mellitus, type 2)  Controlled on 15 units of Lantus daily as well as insulin sensitive sliding scale and 3 units of meal coverage. CBGs 73-87.  Will decrease Lantus to 10 units.  Hemoglobin A1c 9.1% indicating poor outpatient control.  Leukocytosis  Normalized with initiation of antibiotics.  CKD (chronic kidney disease) stage 3, GFR 30-59 ml/min  Baseline creatinine 1.5. Current creatinine usual baseline values.  LA thrombus  Continue heparin and Coumadin per pharmacy protocol.  Nausea and vomiting  Continue anti-nausea medications as needed.  Multifocal atrial tachycardia / afib  Continue beta blocker and calcium channel blocker.  Needed to be placed on a Cardizem drip 04/01/12 due to vomiting up PO.  Cardizem weaned off overnight.  Chronic systolic heart failure  Pro BNP elevated but no evidence of decompensated heart failure upon initial presentation. Watch fluid balance closely and reinitiate diuretics when indicated.  Code Status: full  Family Communication: Vincent [name] [phone] or [phone]).  Left message. Disposition Plan: home when stable.   Medical Consultants:  None.  Other Consultants:  None.  Anti-infectives:  Cefepime 03/31/2012--->  Vancomycin 03/31/2012--->  Levaquin 03/31/2012--->  HPI/Subjective: Vincent [name] has had some nausea but no vomiting overnight. No diarrhea. Ongoing cough. Mild dyspnea.  Objective: Filed Vitals:   04/01/12 2000 04/02/12 0000 04/02/12 0400 04/02/12 0600  BP: 141/71 130/71 123/64   Pulse: 104 75 74 72  Temp: 98.2 F (36.8 C) 98.6 F (37 C) 98.4 F (36.9 C)   TempSrc: Oral Oral Oral   Resp: 29 27 29 24   Height:      Weight:   119.5 kg (263 lb 7.2 oz)   SpO2: 100% 95% 100% 100%    Intake/Output Summary (Last 24 hours) at 04/02/12 0708 Last data filed at 04/02/12 0600  Gross per 24 hour  Intake   1656 ml  Output     45 ml  Net   1611 ml    Exam: Gen:  NAD Cardiovascular:  Heart sounds irregular Respiratory:  Lungs diminished with occasional rhonchi Gastrointestinal:  Abdomen soft, NT/ND, + BS Extremities:  Trace edema  Data Reviewed: Basic Metabolic Panel:  Lab 04/01/12 9528 03/31/12 2120  NA 135 136  K 3.9 4.0  CL 99 99  CO2 29 25  GLUCOSE 84 158*  BUN 22 23  CREATININE 1.47* 1.42*  CALCIUM 9.2 9.6  MG -- --  PHOS -- --   GFR Estimated Creatinine Clearance: 56.2 ml/min (by C-G formula based  on Cr of 1.47). Liver Function Tests:  Lab 04/01/12 0255 03/31/12 2120  AST 28 38*  ALT 23 25  ALKPHOS 104 119*  BILITOT 0.6 0.6  PROT 7.7 8.2  ALBUMIN 2.9* 3.2*    Lab 03/31/12 2120  LIPASE 22  AMYLASE --   Coagulation profile  Lab 04/02/12 0343 04/01/12 0255 03/31/12 2120  INR 1.48 1.27 1.28   PROTIME -- -- --    CBC:  Lab 04/02/12 0343 04/01/12 0255 03/31/12 2120  WBC 7.1 8.4 11.7*  NEUTROABS -- -- 8.3*  HGB 10.9* 11.6* 12.2*  HCT 34.6* 35.6* 37.2*  MCV 94.5 93.2 93.2  PLT 154 216 199   Cardiac Enzymes:  Lab 03/31/12 2120  CKTOTAL --  CKMB --  CKMBINDEX --  TROPONINI <0.30   BNP (last 3 results)  Basename 03/31/12 2120 12/22/11 0600 12/18/11 0955  PROBNP 8718.0* 4724.0* 4560.0*   CBG:  Lab 04/01/12 2107 04/01/12 1620 04/01/12 1147 04/01/12 0825 04/01/12 0117  GLUCAP 87 84 78 73 87   Microbiology Recent Results (from the past 240 hour(s))  URINE CULTURE     Status: Normal (Preliminary result)   Collection Time   03/31/12 10:42 PM      Component Value Range Status Comment   Specimen Description URINE, CLEAN CATCH   Final    Special Requests NONE   Final    Culture  Setup Time 04/01/2012 04:28   Final    Colony Count >=100,000 COLONIES/ML   Final    Culture GRAM NEGATIVE RODS   Final    Report Status PENDING   Incomplete   MRSA PCR SCREENING     Status: Normal   Collection Time   04/01/12  1:19 AM      Component Value Range Status Comment   MRSA by PCR NEGATIVE  NEGATIVE Final   CULTURE, EXPECTORATED SPUTUM-ASSESSMENT     Status: Normal   Collection Time   04/01/12  8:00 PM      Component Value Range Status Comment   Specimen Description SPUTUM   Final    Special Requests NONE   Final    Sputum evaluation     Final    Value: THIS SPECIMEN IS ACCEPTABLE. RESPIRATORY CULTURE REPORT TO FOLLOW.   Report Status 04/01/2012 FINAL   Final   CULTURE, RESPIRATORY     Status: Normal (Preliminary result)   Collection Time   04/01/12  8:00 PM      Component Value Range Status Comment   Specimen Description SPUTUM   Final    Special Requests NONE   Final    Gram Stain     Final    Value: MODERATE WBC PRESENT,BOTH PMN AND MONONUCLEAR     RARE SQUAMOUS EPITHELIAL CELLS PRESENT     NO ORGANISMS SEEN   Culture PENDING   Incomplete    Report Status PENDING    Incomplete      Procedures and Diagnostic Studies: Dg Chest 2 View  03/31/2012  *RADIOLOGY REPORT*  Clinical Data: Shortness of breath, vomiting, altered mental status  CHEST - 2 VIEW  Comparison: 02/09/2012  Findings: Chronic interstitial markings.  No focal consolidation. No pleural effusion or pneumothorax.  Stable mild cardiomegaly.  Degenerative changes of the visualized thoracolumbar spine.  IMPRESSION: No evidence of acute cardiopulmonary disease.   Original Report Authenticated By: Charline Bills, M.D.     Scheduled Meds:    . allopurinol  150 mg Oral q morning - 10a  . antiseptic oral rinse  15 mL Mouth Rinse BID  . atenolol  100 mg Oral BID  . ceFEPime (MAXIPIME) IV  1 g Intravenous Q8H  . diltiazem  300 mg Oral q morning - 10a  . feeding supplement  1 Container Oral TID BM  . insulin aspart  0-5 Units Subcutaneous QHS  . insulin aspart  0-9 Units Subcutaneous TID WC  . insulin aspart  3 Units Subcutaneous TID WC  . insulin glargine  15 Units Subcutaneous QHS  . levofloxacin (LEVAQUIN) IV  750 mg Intravenous QHS  . sodium chloride  3 mL Intravenous Q12H  . vancomycin  1,500 mg Intravenous QHS  . Warfarin - Pharmacist Dosing Inpatient   Does not apply q1800   Continuous Infusions:    . diltiazem (CARDIZEM) infusion 5 mg/hr (04/01/12 1034)  . heparin 900 Units/hr (04/02/12 0513)    Time spent: 35 minutes.   LOS: 2 days   RAMA,CHRISTINA  [facility] Pager [phone].  If 8PM-8AM, please contact night-coverage at [email or web address], password [facility] 04/02/2012, 7:08 AM

## 2012-04-02 NOTE — Evaluation (Signed)
Occupational Therapy Evaluation Patient Details Name: Vincent [name] MRN: [id number] DOB: 09-24-1935 Today's Date: 04/02/2012 Time: 1212-1232 OT Time Calculation (min): 20 min  OT Assessment / Plan / Recommendation Clinical Impression  This 76 year old man was admitted with HCAP.  He had nausea, vomiting, cough at home.  PMH is significant for stent, dm, htn, chf, afib, and prostate CA.  Pt will benefit from skilled OT to increase strength and endurance for adls.  Goals in acute are min A level    OT Assessment  Patient needs continued OT Services    Follow Up Recommendations  SNF (depending upon progress)    Barriers to Discharge      Equipment Recommendations  3 in 1 bedside comode    Recommendations for Other Services    Frequency  Min 2X/week    Precautions / Restrictions Precautions Precautions: Fall Restrictions Weight Bearing Restrictions: No   Pertinent Vitals/Pain VSS.  At end of session c/o stomach pain:  RN aware    ADL  Eating/Feeding: Simulated;Independent Where Assessed - Eating/Feeding: Chair Grooming: Simulated;Set up Where Assessed - Grooming: Unsupported sitting Upper Body Bathing: Simulated;Set up Where Assessed - Upper Body Bathing: Unsupported sitting Lower Body Bathing: Simulated;+2 Total assistance Lower Body Bathing: Patient Percentage: 60% Where Assessed - Lower Body Bathing: Supported sit to stand Upper Body Dressing: Simulated;Minimal assistance Where Assessed - Upper Body Dressing: Unsupported sitting Lower Body Dressing: Simulated;+2 Total assistance Lower Body Dressing: Patient Percentage: 40% Where Assessed - Lower Body Dressing: Supported sit to stand Toilet Transfer: Simulated;+2 Total assistance Toilet Transfer: Patient Percentage: 70% Toilet Transfer Method: Stand pivot (sit to stand pt 50%) Acupuncturist:  (bed to chair) Toileting - Architect and Hygiene: Simulated;+2 Total assistance Toileting - Designer, fashion/clothing and Hygiene: Patient Percentage: 70% Where Assessed - Toileting Clothing Manipulation and Hygiene: Sit to stand from 3-in-1 or toilet Equipment Used: Rolling walker Transfers/Ambulation Related to ADLs: SPT only to chair.  Pt fatiques easily ADL Comments: Pt states he can sometimes can get socks on; could not beyond ankles today    OT Diagnosis: Generalized weakness  OT Problem List: Decreased strength;Decreased activity tolerance;Impaired balance (sitting and/or standing);Decreased knowledge of use of DME or AE;Cardiopulmonary status limiting activity;Pain OT Treatment Interventions:  Self care, AE/DME education, therapeutic activities, balance activities, energy conservation  OT Goals Acute Rehab OT Goals OT Goal Formulation: With patient Time For Goal Achievement: 04/16/12 Potential to Achieve Goals: Good ADL Goals Pt Will Perform Grooming: with min assist;Standing at sink (min guard) ADL Goal: Grooming - Progress: Goal set today Pt Will Perform Lower Body Bathing: with min assist;Sit to stand from chair;with adaptive equipment ADL Goal: Lower Body Bathing - Progress: Goal set today Pt Will Perform Lower Body Dressing: with min assist;Sit to stand from chair;with adaptive equipment ADL Goal: Lower Body Dressing - Progress: Goal set today Pt Will Transfer to Toilet: with min assist;Ambulation;3-in-1 ADL Goal: Toilet Transfer - Progress: Goal set today Pt Will Perform Toileting - Hygiene: with min assist;Sit to stand from 3-in-1/toilet (min guard) ADL Goal: Toileting - Hygiene - Progress: Goal set today Miscellaneous OT Goals Miscellaneous OT Goal #1: Pt will name 3 energy conservation techniques OT Goal: Miscellaneous Goal #1 - Progress: Goal set today  Visit Information  Last OT Received On: 04/02/12 PT/OT Co-Evaluation/Treatment: Yes    Subjective Data      Prior Functioning     Home Living Lives With: Spouse Available Help at Discharge: Family (son in  law--not nearby)  Type of Home: House Home Access: Level entry Home Layout: One level Bathroom Shower/Tub: Engineer, manufacturing systems: Standard Home Adaptive Equipment: Tub transfer bench;Walker - rolling Prior Function Level of Independence: Independent with assistive device(s) (occasional assist with adls) Driving: No Communication Communication: No difficulties Dominant Hand: Left         Vision/Perception     Cognition  Overall Cognitive Status: Appears within functional limits for tasks assessed/performed (mostly:  answers/responses "a little off" at times) Arousal/Alertness: Awake/alert Orientation Level: Disoriented X4 (month/year) Behavior During Session: [facility] for tasks performed    Extremity/Trunk Assessment Right Upper Extremity Assessment RUE ROM/Strength/Tone: Deficits RUE ROM/Strength/Tone Deficits: able to lift to 90 degrees only Left Upper Extremity Assessment LUE ROM/Strength/Tone: Within functional levels     Mobility Bed Mobility Bed Mobility: Supine to Sit Supine to Sit: 4: Min assist;With rails;HOB elevated Transfers Transfers: Sit to Stand Sit to Stand: 1: +2 Total assist Sit to Stand: Patient Percentage: 50% Details for Transfer Assistance: bed to chair     Shoulder Instructions     Exercise     Balance     End of Session OT - End of Session Activity Tolerance: Patient limited by fatigue Patient left: in chair;with call bell/phone within reach  GO     Vincent [name] 04/02/2012, 2:02 PM Marica Otter, OTR/L [phone] 04/02/2012

## 2012-04-02 NOTE — Evaluation (Signed)
Physical Therapy Evaluation Patient Details Name: Vincent [name] MRN: [id number] DOB: 1936-01-28 Today's Date: 04/02/2012 Time: 1212-1232 PT Time Calculation (min): 20 min  PT Assessment / Plan / Recommendation Clinical Impression  Pt.is 76 Yo male who was admitted with nausea, HCAP. Pt. has h/o DM, prostate ca . Pt lives at home with wife. Pt will benefit from PT while in acute care to improve functional mobility. Recommend SNF at DC unless improves and has 24/7 caregivers.    PT Assessment  Patient needs continued PT services    Follow Up Recommendations  SNF    Does the patient have the potential to tolerate intense rehabilitation      Barriers to Discharge Decreased caregiver support      Equipment Recommendations  None recommended by PT    Recommendations for Other Services     Frequency Min 3X/week    Precautions / Restrictions Precautions Precautions: Fall Restrictions Weight Bearing Restrictions: No   Pertinent Vitals/Pain Pt. On 2 l sats > 92% during mobility. HR 67-75 States abdomen hurts when he coughs      Mobility  Bed Mobility Bed Mobility: Supine to Sit Supine to Sit: 4: Min assist;With rails;HOB elevated Transfers Sit to Stand: 1: +2 Total assist Sit to Stand: Patient Percentage: 50% Details for Transfer Assistance: cues for keeping inside RW as pt took several steps to recliner. Ambulation/Gait Ambulation/Gait Assistance:  (several steps to recliner.) Assistive device: Rolling walker    Shoulder Instructions     Exercises     PT Diagnosis: Difficulty walking;Generalized weakness  PT Problem List: Decreased strength;Decreased activity tolerance;Decreased balance;Decreased mobility;Decreased safety awareness;Decreased knowledge of use of DME;Decreased knowledge of precautions PT Treatment Interventions: DME instruction;Gait training;Functional mobility training;Therapeutic activities;Patient/family education   PT Goals Acute Rehab PT Goals PT  Goal Formulation: With patient Time For Goal Achievement: 04/16/12 Potential to Achieve Goals: Good Pt will go Supine/Side to Sit: with supervision;with HOB 0 degrees PT Goal: Supine/Side to Sit - Progress: Goal set today Pt will go Sit to Supine/Side: with supervision PT Goal: Sit to Supine/Side - Progress: Goal set today Pt will go Sit to Stand: with supervision PT Goal: Sit to Stand - Progress: Goal set today Pt will go Stand to Sit: with supervision PT Goal: Stand to Sit - Progress: Goal set today Pt will Ambulate: 51 - 150 feet;with supervision;with rolling walker PT Goal: Ambulate - Progress: Goal set today  Visit Information  Last PT Received On: 04/02/12 Assistance Needed: +2 PT/OT Co-Evaluation/Treatment: Yes    Subjective Data  Subjective: I hurt when I cough Patient Stated Goal: agreed to getting up.   Prior Functioning  Home Living Lives With: Spouse Available Help at Discharge: Family (son in law--not nearby) Type of Home: House Home Access: Level entry Home Layout: One level Bathroom Shower/Tub: Engineer, manufacturing systems: Standard Home Adaptive Equipment: Tub transfer bench;Walker - rolling Prior Function Level of Independence: Independent with assistive device(s) (occasional assist with adls) Driving: No Communication Communication: No difficulties Dominant Hand: Left    Cognition  Overall Cognitive Status: Appears within functional limits for tasks assessed/performed (mostly:  answers/responses "a little off" at times) Arousal/Alertness: Awake/alert Orientation Level: Disoriented X4 (month/year) Behavior During Session: [facility] for tasks performed    Extremity/Trunk Assessment Right Upper Extremity Assessment RUE ROM/Strength/Tone: Deficits RUE ROM/Strength/Tone Deficits: able to lift to 90 degrees only Left Upper Extremity Assessment LUE ROM/Strength/Tone: Within functional levels Right Lower Extremity Assessment RLE ROM/Strength/Tone:  Deficits RLE ROM/Strength/Tone Deficits: at least 4 /5 for  standing and taking a few steps, acknowledges LT RLE Sensation: History of peripheral neuropathy Left Lower Extremity Assessment LLE ROM/Strength/Tone: Deficits LLE ROM/Strength/Tone Deficits: same as R .  LLE Sensation: History of peripheral neuropathy   Balance Static Sitting Balance Static Sitting - Balance Support: Bilateral upper extremity supported;Feet supported Static Sitting - Level of Assistance: 5: Stand by assistance  End of Session PT - End of Session Activity Tolerance: Patient limited by fatigue Patient left: in chair;with call bell/phone within reach Nurse Communication: Mobility status  GP     Rada Hay 04/02/2012, 2:17 PM  [phone]

## 2012-04-02 NOTE — Progress Notes (Signed)
ANTICOAGULATION CONSULT NOTE - Follow Up Consult  Pharmacy Consult for IV heparin, Coumadin Indication: h/o atrial fibrillation and L atrial thrombus  No Known Allergies  Patient Measurements: Height: 5\' 9"  (175.3 cm) Weight: 263 lb 7.2 oz (119.5 kg) IBW/kg (Calculated) : 70.7   Vital Signs: Temp: 98.4 F (36.9 C) (12/19 0400) Temp src: Oral (12/19 0400) BP: 123/64 mmHg (12/19 0400) Pulse Rate: 74  (12/19 0400)  Labs:  Basename 04/02/12 0343 04/01/12 1811 04/01/12 0944 04/01/12 0255 03/31/12 2120  HGB 10.9* -- -- 11.6* --  HCT 34.6* -- -- 35.6* 37.2*  PLT 154 -- -- 216 199  APTT -- -- -- -- --  LABPROT 17.5* -- -- 15.6* 15.7*  INR 1.48 -- -- 1.27 1.28  HEPARINUNFRC 0.76* 0.90* 0.67 -- --  CREATININE -- -- -- 1.47* 1.42*  CKTOTAL -- -- -- -- --  CKMB -- -- -- -- --  TROPONINI -- -- -- -- <0.30    Estimated Creatinine Clearance: 56.2 ml/min (by C-G formula based on Cr of 1.47).   Assessment:  76 yom on Coumadin PTA for h/o L atrial thrombus and history of atrial fibrillation. INR subtherapeutic (1.28) on admit, MD resuming Coumadin along with IV heparin as bridge.  IV heparin currently running at 1100 units/hr, no bleeding/complications noted by RN.  Heparin level slightly above goal at 0.76.   Goal of Therapy:  INR 2-3 Heparin level 0.3-0.7 units/ml Monitor platelets by anticoagulation protocol: Yes   Plan:   Decrease IV heparin to 900 units/hr  Recheck heparin level in 8 hours.    Continue warfarin as ordered.   Daily Heparin level, CBC and PT/INR.  Pharmacy will f/u  Lorenza Evangelist 04/02/2012 5:05 AM

## 2012-04-03 ENCOUNTER — Inpatient Hospital Stay (HOSPITAL_COMMUNITY): Payer: Medicare Other

## 2012-04-03 DIAGNOSIS — R5381 Other malaise: Secondary | ICD-10-CM

## 2012-04-03 DIAGNOSIS — J159 Unspecified bacterial pneumonia: Secondary | ICD-10-CM

## 2012-04-03 DIAGNOSIS — G92 Toxic encephalopathy: Secondary | ICD-10-CM

## 2012-04-03 LAB — BASIC METABOLIC PANEL WITH GFR
BUN: 39 mg/dL — ABNORMAL HIGH (ref 6–23)
CO2: 25 meq/L (ref 19–32)
Calcium: 8.2 mg/dL — ABNORMAL LOW (ref 8.4–10.5)
Chloride: 100 meq/L (ref 96–112)
Creatinine, Ser: 2.08 mg/dL — ABNORMAL HIGH (ref 0.50–1.35)
GFR calc Af Amer: 34 mL/min — ABNORMAL LOW
GFR calc non Af Amer: 30 mL/min — ABNORMAL LOW
Glucose, Bld: 146 mg/dL — ABNORMAL HIGH (ref 70–99)
Potassium: 4.1 meq/L (ref 3.5–5.1)
Sodium: 133 meq/L — ABNORMAL LOW (ref 135–145)

## 2012-04-03 LAB — CBC
HCT: 32.8 % — ABNORMAL LOW (ref 39.0–52.0)
Hemoglobin: 10.5 g/dL — ABNORMAL LOW (ref 13.0–17.0)
MCH: 30.1 pg (ref 26.0–34.0)
MCHC: 32 g/dL (ref 30.0–36.0)
MCV: 94 fL (ref 78.0–100.0)
Platelets: 142 K/uL — ABNORMAL LOW (ref 150–400)
RBC: 3.49 MIL/uL — ABNORMAL LOW (ref 4.22–5.81)
RDW: 16.7 % — ABNORMAL HIGH (ref 11.5–15.5)
WBC: 6.2 K/uL (ref 4.0–10.5)

## 2012-04-03 LAB — INFLUENZA PANEL BY PCR (TYPE A & B)
Influenza A By PCR: POSITIVE — AB
Influenza B By PCR: NEGATIVE

## 2012-04-03 LAB — HEPARIN LEVEL (UNFRACTIONATED)
Heparin Unfractionated: 0.38 [IU]/mL (ref 0.30–0.70)
Heparin Unfractionated: 0.45 [IU]/mL (ref 0.30–0.70)

## 2012-04-03 LAB — PROTIME-INR
INR: 1.55 — ABNORMAL HIGH (ref 0.00–1.49)
Prothrombin Time: 18.1 s — ABNORMAL HIGH (ref 11.6–15.2)

## 2012-04-03 LAB — GLUCOSE, CAPILLARY: Glucose-Capillary: 168 mg/dL — ABNORMAL HIGH (ref 70–99)

## 2012-04-03 MED ORDER — OSELTAMIVIR PHOSPHATE 75 MG PO CAPS
75.0000 mg | ORAL_CAPSULE | Freq: Two times a day (BID) | ORAL | Status: DC
  Administered 2012-04-03 – 2012-04-06 (×7): 75 mg via ORAL
  Filled 2012-04-03 (×8): qty 1

## 2012-04-03 MED ORDER — INSULIN GLARGINE 100 UNIT/ML ~~LOC~~ SOLN
15.0000 [IU] | Freq: Every day | SUBCUTANEOUS | Status: DC
  Administered 2012-04-03: 15 [IU] via SUBCUTANEOUS
  Administered 2012-04-04: 23:00:00 via SUBCUTANEOUS

## 2012-04-03 MED ORDER — SODIUM CHLORIDE 0.9 % IV SOLN: INTRAVENOUS | Status: DC

## 2012-04-03 MED ORDER — WARFARIN SODIUM 5 MG PO TABS
5.0000 mg | ORAL_TABLET | Freq: Once | ORAL | Status: AC
  Administered 2012-04-03: 5 mg via ORAL
  Filled 2012-04-03: qty 1

## 2012-04-03 NOTE — Progress Notes (Signed)
Clinical Social Work Department BRIEF PSYCHOSOCIAL ASSESSMENT 04/03/2012  Patient:  Vincent [name], Vincent [name]     Account Number:  [id number]     Admit date:  03/31/2012  Clinical Social Worker:  Orpah Greek  Date/Time:  04/03/2012 12:54 PM  Referred by:  Physician  Date Referred:  04/03/2012 Referred for  SNF Placement   Other Referral:   Interview type:  Patient Other interview type:    PSYCHOSOCIAL DATA Living Status:  WIFE Admitted from facility:   Level of care:   Primary support name:  June Arnold (wife) h#: [phone] c#: [phone] Primary support relationship to patient:  SPOUSE Degree of support available:   good    CURRENT CONCERNS Current Concerns  Post-Acute Placement   Other Concerns:    SOCIAL WORK ASSESSMENT / PLAN CSW spoke with patient re: discharge planning. Note PT recommended SNF vs. CIR. Patient states he's been to CIR in the past. Patient is agreeable to SNF as backup to CIR.   Assessment/plan status:  Information/Referral to [facility] Other assessment/ plan:   Information/referral to community resources:   CSW completed FL2 and faxed information out to [facility] - provided list of facilities to patient.    PATIENT'S/FAMILY'S RESPONSE TO PLAN OF CARE: Patient states that he is agreeable with "whatever they recommend". Awaiting CIR consult, will provide SNF bed offers when available as CIR backup.        Unice Bailey, LCSW [facility] Clinical Social Worker cell #: [phone]

## 2012-04-03 NOTE — Progress Notes (Signed)
[facility] PROGRESS NOTE  CORDELLE DAHMEN ZOX:[id number] DOB: 06/04/1935 DOA: 03/31/2012 PCP: [name], MD  Brief narrative: Mr. Bresee is a 76 year old man with a past medical history of chronic systolic heart failure, EF of 81%, and left atrial thrombus on chronic Coumadin therapy, atrial fibrillation, diabetes, stage III chronic kidney disease recently hospitalized 12/2011 for coagulase-negative staph bacteremia, TEE negative, who presented to the hospital on 03/31/2012 with intractable nausea and vomiting as well as cough. He was also noted to be febrile upon initial presentation.  Assessment/Plan: Principal Problem:  *Healthcare-associated pneumonia  Admitted and placed on broad-spectrum antibiotics including vancomycin, cefepime, and Levaquin.  Will narrow to Levaquin.  F/U CXR not convincing for PNA, although subtle infiltrate present.  Followup blood and sputum cultures which are negative to date.   HIV antibody, Legionella and strep pneumo negative. Active Problems:  Normocytic anemia  Hemoglobin trending down, likely from AOCD.  UTI  Urine culture growing > 100,000 colonies Enterobacter.  Sensitive to Levaquin, which the patient is currently taking.  Hypertension  Continue current therapy. On a beta blocker. Diuretics on hold. Blood pressure stable.  DM2 (diabetes mellitus, type 2)  Sugars up on 10 units of Lantus daily as well as insulin sensitive sliding scale and 3 units of meal coverage. CBGs 87-192.  Will increase Lantus to15 units.  Hemoglobin A1c 9.1% indicating poor outpatient control.  Leukocytosis  Normalized with initiation of antibiotics.  CKD (chronic kidney disease) stage 3, GFR 30-59 ml/min  Creatinine bump over the past 24 hours. May be related to vancomycin. Not currently on any diuretics or ACE inhibitors/ARBS.  Monitor closely. D/C vancomycin.  LA thrombus  Continue heparin and Coumadin per pharmacy protocol.  Nausea and  vomiting  Continue anti-nausea medications as needed.  Multifocal atrial tachycardia / afib  Continue beta blocker and calcium channel blocker.    Chronic systolic heart failure  Pro BNP elevated but no evidence of decompensated heart failure upon initial presentation. Watch fluid balance closely and reinitiate diuretics when indicated.  Weight trending down.  Code Status: full  Family Communication: Daughter Annabelle Harman [phone] or [phone]) by telephone today.  Disposition Plan: home when stable.   Medical Consultants:  None.  Other Consultants:  None.  Anti-infectives:  Cefepime 03/31/2012--->04/03/12  Vancomycin 03/31/2012--->04/03/12  Levaquin 03/31/2012--->  HPI/Subjective: Mr. Gillison has not had any further N/V. No diarrhea. No dyspnea today.  Objective: Filed Vitals:   04/02/12 2356 04/03/12 0000 04/03/12 0220 04/03/12 0440  BP: 116/68   115/63  Pulse: 80   82  Temp:  98.1 F (36.7 C)    TempSrc:  Oral    Resp: 20   19  Height:      Weight:   115.3 kg (254 lb 3.1 oz)   SpO2: 95%   92%    Intake/Output Summary (Last 24 hours) at 04/03/12 0724 Last data filed at 04/03/12 0533  Gross per 24 hour  Intake   2105 ml  Output    717 ml  Net   1388 ml    Exam: Gen:  NAD Cardiovascular:  Heart sounds irregular Respiratory:  Lungs diminished, CTAB Gastrointestinal:  Abdomen soft, NT/ND, + BS Extremities:  Trace edema  Data Reviewed: Basic Metabolic Panel:  Lab 04/03/12 8657 04/01/12 0255 03/31/12 2120  NA 133* 135 136  K 4.1 3.9 --  CL 100 99 99  CO2 25 29 25   GLUCOSE 146* 84 158*  BUN 39* 22 23  CREATININE 2.08* 1.47* 1.42*  CALCIUM 8.2*  9.2 9.6  MG -- -- --  PHOS -- -- --   GFR Estimated Creatinine Clearance: 39 ml/min (by C-G formula based on Cr of 2.08). Liver Function Tests:  Lab 04/01/12 0255 03/31/12 2120  AST 28 38*  ALT 23 25  ALKPHOS 104 119*  BILITOT 0.6 0.6  PROT 7.7 8.2  ALBUMIN 2.9* 3.2*    Lab 03/31/12 2120  LIPASE 22   AMYLASE --   Coagulation profile  Lab 04/03/12 0335 04/02/12 0343 04/01/12 0255 03/31/12 2120  INR 1.55* 1.48 1.27 1.28  PROTIME -- -- -- --    CBC:  Lab 04/03/12 0335 04/02/12 0343 04/01/12 0255 03/31/12 2120  WBC 6.2 7.1 8.4 11.7*  NEUTROABS -- -- -- 8.3*  HGB 10.5* 10.9* 11.6* 12.2*  HCT 32.8* 34.6* 35.6* 37.2*  MCV 94.0 94.5 93.2 93.2  PLT 142* 154 216 199   Cardiac Enzymes:  Lab 03/31/12 2120  CKTOTAL --  CKMB --  CKMBINDEX --  TROPONINI <0.30   BNP (last 3 results)  Basename 03/31/12 2120 12/22/11 0600 12/18/11 0955  PROBNP 8718.0* 4724.0* 4560.0*   CBG:  Lab 04/02/12 2123 04/02/12 1640 04/02/12 1300 04/02/12 0805 04/01/12 2107  GLUCAP 192* 184* 168* 107* 87   Microbiology Recent Results (from the past 240 hour(s))  CULTURE, BLOOD (ROUTINE X 2)     Status: Normal (Preliminary result)   Collection Time   03/31/12 10:20 PM      Component Value Range Status Comment   Specimen Description BLOOD RIGHT ARM   Final    Special Requests BOTTLES DRAWN AEROBIC AND ANAEROBIC   Final    Culture  Setup Time 04/01/2012 01:56   Final    Culture     Final    Value:        BLOOD CULTURE RECEIVED NO GROWTH TO DATE CULTURE WILL BE HELD FOR 5 DAYS BEFORE ISSUING A FINAL NEGATIVE REPORT   Report Status PENDING   Incomplete   CULTURE, BLOOD (ROUTINE X 2)     Status: Normal (Preliminary result)   Collection Time   03/31/12 10:30 PM      Component Value Range Status Comment   Specimen Description BLOOD LEFT ARM   Final    Special Requests BOTTLES DRAWN AEROBIC ONLY NO VOLUME INDICATED   Final    Culture  Setup Time 04/01/2012 01:56   Final    Culture     Final    Value:        BLOOD CULTURE RECEIVED NO GROWTH TO DATE CULTURE WILL BE HELD FOR 5 DAYS BEFORE ISSUING A FINAL NEGATIVE REPORT   Report Status PENDING   Incomplete   URINE CULTURE     Status: Normal   Collection Time   03/31/12 10:42 PM      Component Value Range Status Comment   Specimen Description URINE, CLEAN  CATCH   Final    Special Requests NONE   Final    Culture  Setup Time 04/01/2012 04:28   Final    Colony Count >=100,000 COLONIES/ML   Final    Culture ENTEROBACTER CLOACAE   Final    Report Status 04/02/2012 FINAL   Final    Organism ID, Bacteria ENTEROBACTER CLOACAE   Final   MRSA PCR SCREENING     Status: Normal   Collection Time   04/01/12  1:19 AM      Component Value Range Status Comment   MRSA by PCR NEGATIVE  NEGATIVE Final   CULTURE,  EXPECTORATED SPUTUM-ASSESSMENT     Status: Normal   Collection Time   04/01/12  8:00 PM      Component Value Range Status Comment   Specimen Description SPUTUM   Final    Special Requests NONE   Final    Sputum evaluation     Final    Value: THIS SPECIMEN IS ACCEPTABLE. RESPIRATORY CULTURE REPORT TO FOLLOW.   Report Status 04/01/2012 FINAL   Final   CULTURE, RESPIRATORY     Status: Normal (Preliminary result)   Collection Time   04/01/12  8:00 PM      Component Value Range Status Comment   Specimen Description SPUTUM   Final    Special Requests NONE   Final    Gram Stain     Final    Value: MODERATE WBC PRESENT,BOTH PMN AND MONONUCLEAR     RARE SQUAMOUS EPITHELIAL CELLS PRESENT     NO ORGANISMS SEEN   Culture PENDING   Incomplete    Report Status PENDING   Incomplete      Procedures and Diagnostic Studies: Dg Chest 2 View  03/31/2012  *RADIOLOGY REPORT*  Clinical Data: Shortness of breath, vomiting, altered mental status  CHEST - 2 VIEW  Comparison: 02/09/2012  Findings: Chronic interstitial markings.  No focal consolidation. No pleural effusion or pneumothorax.  Stable mild cardiomegaly.  Degenerative changes of the visualized thoracolumbar spine.  IMPRESSION: No evidence of acute cardiopulmonary disease.   Original Report Authenticated By: Charline Bills, M.D.    Dg Chest Port 1 View  04/03/2012  *RADIOLOGY REPORT*  Clinical Data: Hospital acquired pneumonia.  PORTABLE CHEST - 1 VIEW  Comparison: 03/31/2012  Findings: Single view  of the chest was obtained.  Stable calcified granuloma in the right upper lung.  There are coarse lung markings suggesting chronic changes.  There are vague densities in the right upper lung and difficult to exclude subtle airspace disease.  Heart size is stable within normal limits.  No evidence for a pneumothorax.  IMPRESSION: Chronic lung changes.  Difficult to exclude subtle acute disease in the right lung.   Original Report Authenticated By: Richarda Overlie, M.D.    Scheduled Meds:    . allopurinol  150 mg Oral q morning - 10a  . antiseptic oral rinse  15 mL Mouth Rinse BID  . atenolol  100 mg Oral BID  . ceFEPime (MAXIPIME) IV  1 g Intravenous Q8H  . diltiazem  300 mg Oral q morning - 10a  . feeding supplement  1 Container Oral TID BM  . insulin aspart  0-5 Units Subcutaneous QHS  . insulin aspart  0-9 Units Subcutaneous TID WC  . insulin aspart  3 Units Subcutaneous TID WC  . insulin glargine  10 Units Subcutaneous QHS  . sodium chloride  3 mL Intravenous Q12H  . vancomycin  1,500 mg Intravenous QHS  . Warfarin - Pharmacist Dosing Inpatient   Does not apply q1800   Continuous Infusions:    . diltiazem (CARDIZEM) infusion 5 mg/hr (04/01/12 1034)  . heparin 1,000 Units/hr (04/02/12 2157)    Time spent: 35 minutes.   LOS: 3 days   Kohlton Gilpatrick  [facility] Pager [phone].  If 8PM-8AM, please contact night-coverage at [email or web address], password [facility] 04/03/2012, 7:24 AM

## 2012-04-03 NOTE — Progress Notes (Signed)
Occupational Therapy Treatment Patient Details Name: Vincent [name] MRN: [id number] DOB: 16-Jan-1936 Today's Date: 04/03/2012 Time: 8295-6213 OT Time Calculation (min): 23 min  OT Assessment / Plan / Recommendation Comments on Treatment Session Pt mobilizing much better today. Feel pt would be a good CIR candidate.    Follow Up Recommendations  CIR    Barriers to Discharge       Equipment Recommendations  3 in 1 bedside comode    Recommendations for Other Services Rehab consult  Frequency Min 2X/week   Plan Discharge plan needs to be updated    Precautions / Restrictions Precautions Precautions: Fall   Pertinent Vitals/Pain Pt denied pain. sats at 93% on RA following light ADL activity.    ADL  Grooming: Teeth care;Min guard Where Assessed - Grooming: Supported standing Lower Body Dressing: Moderate assistance Where Assessed - Lower Body Dressing: Unsupported sitting (to don/doff socks.) Toilet Transfer: Minimal assistance Toilet Transfer Method: Sit to stand Toilet Transfer Equipment: Other (comment) (to recliner.) Transfers/Ambulation Related to ADLs: Pt took several steps into hallway. Tolerated well today.    OT Diagnosis:    OT Problem List:   OT Treatment Interventions:     OT Goals ADL Goals Pt Will Perform Grooming: with supervision;Standing at sink ADL Goal: Grooming - Progress: Updated due to goal met ADL Goal: Lower Body Dressing - Progress: Progressing toward goals Pt Will Transfer to Toilet: with supervision;Ambulation;with DME ADL Goal: Toilet Transfer - Progress: Updated due to goal met Miscellaneous OT Goals OT Goal: Miscellaneous Goal #1 - Progress: Progressing toward goals  Visit Information  Last OT Received On: 04/03/12 Assistance Needed: +2 PT/OT Co-Evaluation/Treatment: Yes    Subjective Data  Subjective: "This really tires me out." When pt was asked to cross legs to adjust socks.   Prior Functioning       Cognition  Overall  Cognitive Status: Appears within functional limits for tasks assessed/performed Arousal/Alertness: Awake/alert Orientation Level: Appears intact for tasks assessed Behavior During Session: [facility] for tasks performed    Mobility  Shoulder Instructions Bed Mobility Supine to Sit: 3: Mod assist;With rails Details for Bed Mobility Assistance: support to pull self to edge of bed. Transfers Sit to Stand: 4: Min assist;From bed;With upper extremity assist       Exercises      Balance Static Sitting Balance Static Sitting - Balance Support: Left upper extremity supported Static Sitting - Level of Assistance: 5: Stand by assistance Static Sitting - Comment/# of Minutes: Pt stood ~2 minutes to brush teeth.   End of Session OT - End of Session Activity Tolerance: Patient tolerated treatment well Patient left: in chair;with call bell/phone within reach  GO     Erling Arrazola A OTR/L [phone] 04/03/2012, 1:46 PM

## 2012-04-03 NOTE — Progress Notes (Signed)
Patient is currently active with [facility] Care Management for chronic disease management services.  Patient has been engaged by a [employer]. Patient will receive a post discharge transition of care call and will be evaluated for monthly home visits for assessments and disease process education. Of note, [facility] Care Management services does not replace or interfere with any services that are arranged by inpatient case management or social work.  For additional questions or referrals please contact Anibal Henderson BSN RN [facility] Liaison at [phone].

## 2012-04-03 NOTE — Progress Notes (Signed)
Dr. Riley Kill (CIR MD) noted patient is appropriate for CIR, awaiting CIR RN follow-up. Patient's son-in-law, Timothy Lasso made aware.   Clinical Social Work Department CLINICAL SOCIAL WORK PLACEMENT NOTE 04/03/2012  Patient:  Vincent [name], Vincent [name]  Account Number:  [id number] Admit date:  03/31/2012  Clinical Social Worker:  Orpah Greek  Date/time:  04/03/2012 01:49 PM  Clinical Social Work is seeking post-discharge placement for this patient at the following level of care:   SKILLED NURSING   (*CSW will update this form in Epic as items are completed)   04/03/2012  Patient/family provided with [facility] Department of Clinical Social Work's list of facilities offering this level of care within the geographic area requested by the patient (or if unable, by the patient's family).  04/03/2012  Patient/family informed of their freedom to choose among providers that offer the needed level of care, that participate in Medicare, Medicaid or managed care program needed by the patient, have an available bed and are willing to accept the patient.  04/03/2012  Patient/family informed of [facility]' ownership interest in [facility], as well as of the fact that they are under no obligation to receive care at this facility.  PASARR submitted to EDS on 04/03/2012 PASARR number received from EDS on 04/03/2012  FL2 transmitted to all facilities in geographic area requested by pt/family on  04/03/2012 FL2 transmitted to all facilities within larger geographic area on   Patient informed that his/her managed care company has contracts with or will negotiate with  certain facilities, including the following:     Patient/family informed of bed offers received:   Patient chooses bed at  Physician recommends and patient chooses bed at    Patient to be transferred to  on   Patient to be transferred to facility by   The following physician request were entered in Epic:   Additional  Comments:  Unice Bailey, LCSW [facility] Clinical Social Worker cell #: [phone]

## 2012-04-03 NOTE — Consult Note (Signed)
Physical Medicine and Rehabilitation Consult Reason for Consult: Deconditioning/pneumonia Referring Physician: [facility]   HPI: Vincent [name] is a 76 y.o. right-handed male with history of diabetes mellitus, congestive heart failure, atrial fibrillation as well as left atrial thrombus with chronic Coumadin and chronic renal insufficiency with baseline creatinine 1.42. Admitted 03/31/2012 with nausea, vomiting and new cough. Patient recently discharged from the hospital in September for cardiac stenting as well as bacteremia. Noted low-grade fever 100.3. Chest x-ray with chronic findings questionable pneumonia.. Placed on broad-spectrum antibiotics for suspect healthcare associated pneumonia. Renal function continues to be monitored with latest creatinine 2.08. He remains on chronic Coumadin therapy with no bleeding episodes. Physical and occupational therapy evaluations are completed. M.D. is requested physical medicine rehabilitation consult to consider inpatient rehabilitation services   ROS: Cough, sob, mild confusion, weakness, swelling A 12 point review of systems has been performed and if not noted above is otherwise negative.   Past Medical History  Diagnosis Date  . Diabetes mellitus   . Hypertension   . CHF (congestive heart failure)   . Cancer   . Pneumonia   . Irregular heartbeat   . Prostate cancer   . Shortness of breath   . GERD (gastroesophageal reflux disease)   . Acute on chronic diastolic CHF (congestive heart failure) 12/18/2011  . Atrial fibrillation with rapid ventricular response, history of PAF was in SR in 08/2011 12/18/2011  . CKD (chronic kidney disease) stage 3, GFR 30-59 ml/min 12/26/2011   Past Surgical History  Procedure Date  . Prostate surgery   . Tee without cardioversion 12/27/2011    Procedure: TRANSESOPHAGEAL ECHOCARDIOGRAM (TEE);  Surgeon: Chrystie Nose, MD;  Location: [facility] OR;  Service: Cardiovascular;  Laterality: N/A;  MD request Main OR  . Cardioversion  12/27/2011    Procedure: CARDIOVERSION;  Surgeon: Chrystie Nose, MD;  Location: [facility] OR;  Service: Cardiovascular;  Laterality: N/A;   Family History  Problem Relation Age of Onset  . Diabetes Mellitus II Mother   . Sudden death Mother   . Prostate cancer Father    Social History:  reports that he has never smoked. He has never used smokeless tobacco. He reports that he does not drink alcohol or use illicit drugs. Allergies: No Known Allergies Medications Prior to Admission  Medication Sig Dispense Refill  . allopurinol (ZYLOPRIM) 100 MG tablet Take 150 mg by mouth every morning.       Marland Kitchen atenolol (TENORMIN) 100 MG tablet Take 100 mg by mouth 2 (two) times daily.      Marland Kitchen diltiazem (TIAZAC) 300 MG 24 hr capsule Take 300 mg by mouth every morning.       . furosemide (LASIX) 80 MG tablet Take 0.5 tablets (40 mg total) by mouth 2 (two) times daily.  30 tablet  0  . insulin NPH-insulin regular (NOVOLIN 70/30) (70-30) 100 UNIT/ML injection Inject 20-50 Units into the skin 2 (two) times daily with a meal. Takes 50 units in the am & 20 units in the pm      . polyethylene glycol (MIRALAX / GLYCOLAX) packet Take 17 g by mouth daily as needed. For constipation      . potassium chloride (K-DUR,KLOR-CON) 10 MEQ tablet Take 20 mEq by mouth 2 (two) times daily.       Marland Kitchen senna-docusate (SENOKOT-S) 8.6-50 MG per tablet Take 1 tablet by mouth daily as needed. For constipation      . warfarin (COUMADIN) 4 MG tablet Take 4 mg by mouth  every morning.         Home: Home Living Lives With: Spouse Available Help at Discharge: Family (son in law--not nearby) Type of Home: House Home Access: Level entry Home Layout: One level Bathroom Shower/Tub: Engineer, manufacturing systems: Standard Home Adaptive Equipment: Tub transfer bench;Walker - rolling  Functional History: Prior Function Driving: No Functional Status:  Mobility: Bed Mobility Bed Mobility: Supine to Sit Supine to Sit: 3: Mod assist;With  rails Transfers Sit to Stand: 4: Min assist;From bed;With upper extremity assist Sit to Stand: Patient Percentage: 50% Ambulation/Gait Ambulation/Gait Assistance: 4: Min assist Ambulation Distance (Feet): 60 Feet Assistive device: Rolling walker Ambulation/Gait Assistance Details: slow pace, cues for staying inside RW Gait Pattern: Step-through pattern;Decreased stride length;Trunk flexed Gait velocity: decreased    ADL: ADL Eating/Feeding: Simulated;Independent Where Assessed - Eating/Feeding: Chair Grooming: Simulated;Set up Where Assessed - Grooming: Unsupported sitting Upper Body Bathing: Simulated;Set up Where Assessed - Upper Body Bathing: Unsupported sitting Lower Body Bathing: Simulated;+2 Total assistance Where Assessed - Lower Body Bathing: Supported sit to stand Upper Body Dressing: Simulated;Minimal assistance Where Assessed - Upper Body Dressing: Unsupported sitting Lower Body Dressing: Simulated;+2 Total assistance Where Assessed - Lower Body Dressing: Supported sit to stand Toilet Transfer: Simulated;+2 Total assistance Toilet Transfer Method: Stand pivot (sit to stand pt 50%) Acupuncturist:  (bed to chair) Equipment Used: Rolling walker Transfers/Ambulation Related to ADLs: SPT only to chair.  Pt fatiques easily ADL Comments: Pt states he can sometimes can get socks on; could not beyond ankles today  Cognition: Cognition Arousal/Alertness: Awake/alert Orientation Level: Oriented X4;Other (comment) (exerkiences periods of confusion) Cognition Overall Cognitive Status: Appears within functional limits for tasks assessed/performed Arousal/Alertness: Awake/alert Orientation Level: Appears intact for tasks assessed Behavior During Session: [facility] for tasks performed  Blood pressure 104/49, pulse 92, temperature 98 F (36.7 C), temperature source Oral, resp. rate 20, height 5\' 9"  (1.753 m), weight 115.3 kg (254 lb 3.1 oz), SpO2 97.00%.   PHYSICAL  EXAM:  General: Alert and oriented x 2-3, No apparent distress. Large man HEENT: Head is normocephalic, atraumatic, PERRLA, EOMI, sclera anicteric, oral mucosa pink and moist, dentition intact, ext ear canals clear,  Neck: Supple without JVD or lymphadenopathy Heart: Reg rate and rhythm. No murmurs rubs or gallops Chest: no wheezes, a few rales, no rhonchi; no distress Abdomen: Soft, non-tender, non-distended, bowel sounds positive. Extremities: No clubbing, cyanosis; 1+ lower ext edema. Pulses are 2+ Skin: Clean and intact without signs of breakdown Neuro: Pt is cognitively appropriate with fair insight. occasionally became disoriented during conversation. sometimes needed to be redirected. Cranial nerves 2-12 are intact. Sensory exam is normal. Reflexes are 2+ in all 4's. Fine motor coordination is intact. Intentional  Tremors UE. Motor function is grossly 4/5 UE. LE 3+ proximally, 4/5 distally. Mild sensory loss distally in stocking glove distribution over both legs.   Musculoskeletal:  . Posture appropriate Psych: Pt's affect is appropriate. Pt is cooperative    Results for orders placed during the hospital encounter of 03/31/12 (from the past 24 hour(s))  GLUCOSE, CAPILLARY     Status: Abnormal   Collection Time   04/02/12  4:40 PM      Component Value Range   Glucose-Capillary 184 (*) 70 - 99 mg/dL   Comment 1 Documented in Chart     Comment 2 Notify RN    HEPARIN LEVEL (UNFRACTIONATED)     Status: Normal   Collection Time   04/02/12  8:41 PM  Component Value Range   Heparin Unfractionated 0.38  0.30 - 0.70 IU/mL  GLUCOSE, CAPILLARY     Status: Abnormal   Collection Time   04/02/12  9:23 PM      Component Value Range   Glucose-Capillary 192 (*) 70 - 99 mg/dL   Comment 1 Notify RN    PROTIME-INR     Status: Abnormal   Collection Time   04/03/12  3:35 AM      Component Value Range   Prothrombin Time 18.1 (*) 11.6 - 15.2 seconds   INR 1.55 (*) 0.00 - 1.49  HEPARIN  LEVEL (UNFRACTIONATED)     Status: Normal   Collection Time   04/03/12  3:35 AM      Component Value Range   Heparin Unfractionated 0.38  0.30 - 0.70 IU/mL  BASIC METABOLIC PANEL     Status: Abnormal   Collection Time   04/03/12  3:35 AM      Component Value Range   Sodium 133 (*) 135 - 145 mEq/L   Potassium 4.1  3.5 - 5.1 mEq/L   Chloride 100  96 - 112 mEq/L   CO2 25  19 - 32 mEq/L   Glucose, Bld 146 (*) 70 - 99 mg/dL   BUN 39 (*) 6 - 23 mg/dL   Creatinine, Ser 1.61 (*) 0.50 - 1.35 mg/dL   Calcium 8.2 (*) 8.4 - 10.5 mg/dL   GFR calc non Af Amer 30 (*) >90 mL/min   GFR calc Af Amer 34 (*) >90 mL/min  CBC     Status: Abnormal   Collection Time   04/03/12  3:35 AM      Component Value Range   WBC 6.2  4.0 - 10.5 K/uL   RBC 3.49 (*) 4.22 - 5.81 MIL/uL   Hemoglobin 10.5 (*) 13.0 - 17.0 g/dL   HCT 09.6 (*) 04.5 - 40.9 %   MCV 94.0  78.0 - 100.0 fL   MCH 30.1  26.0 - 34.0 pg   MCHC 32.0  30.0 - 36.0 g/dL   RDW 81.1 (*) 91.4 - 78.2 %   Platelets 142 (*) 150 - 400 K/uL  GLUCOSE, CAPILLARY     Status: Abnormal   Collection Time   04/03/12  7:50 AM      Component Value Range   Glucose-Capillary 102 (*) 70 - 99 mg/dL  INFLUENZA PANEL BY PCR     Status: Abnormal   Collection Time   04/03/12  9:06 AM      Component Value Range   Influenza A By PCR POSITIVE (*) NEGATIVE   Influenza B By PCR NEGATIVE  NEGATIVE   H1N1 flu by pcr DETECTED (*) NOT DETECTED  HEPARIN LEVEL (UNFRACTIONATED)     Status: Normal   Collection Time   04/03/12 11:05 AM      Component Value Range   Heparin Unfractionated 0.45  0.30 - 0.70 IU/mL  GLUCOSE, CAPILLARY     Status: Abnormal   Collection Time   04/03/12 11:58 AM      Component Value Range   Glucose-Capillary 139 (*) 70 - 99 mg/dL   Comment 1 Notify RN     Dg Chest Port 1 View  04/03/2012  *RADIOLOGY REPORT*  Clinical Data: Hospital acquired pneumonia.  PORTABLE CHEST - 1 VIEW  Comparison: 03/31/2012  Findings: Single view of the chest was  obtained.  Stable calcified granuloma in the right upper lung.  There are coarse lung markings suggesting chronic changes.  There  are vague densities in the right upper lung and difficult to exclude subtle airspace disease.  Heart size is stable within normal limits.  No evidence for a pneumothorax.  IMPRESSION: Chronic lung changes.  Difficult to exclude subtle acute disease in the right lung.   Original Report Authenticated By: Richarda Overlie, M.D.     Assessment/Plan: Diagnosis: deconditioning after pneumonia, mild encephalopathy 1. Does the need for close, 24 hr/day medical supervision in concert with the patient's rehab needs make it unreasonable for this patient to be served in a less intensive setting? Yes 2. Co-Morbidities requiring supervision/potential complications: DM, htn, chf, prostate cancer, chf 3. Due to bladder management, bowel management, safety, skin/wound care, disease management, medication administration, pain management and patient education, does the patient require 24 hr/day rehab nursing? Yes 4. Does the patient require coordinated care of a physician, rehab nurse, PT (1-2 hrs/day, 5 days/week) and OT (1-2 hrs/day, 5 days/week) to address physical and functional deficits in the context of the above medical diagnosis(es)? Yes Addressing deficits in the following areas: balance, endurance, locomotion, strength, transferring, bowel/bladder control, bathing, dressing, feeding, grooming, toileting, cognition and psychosocial support 5. Can the patient actively participate in an intensive therapy program of at least 3 hrs of therapy per day at least 5 days per week? Yes 6. The potential for patient to make measurable gains while on inpatient rehab is good 7. Anticipated functional outcomes upon discharge from inpatient rehab are mod I with PT, mod I with OT, n/a with SLP. 8. Estimated rehab length of stay to reach the above functional goals is: one week 9. Does the patient have  adequate social supports to accommodate these discharge functional goals? Potentially 10. Anticipated D/C setting: Home 11. Anticipated post D/C treatments: HH therapy 12. Overall Rehab/Functional Prognosis: good  RECOMMENDATIONS: This patient's condition is appropriate for continued rehabilitative care in the following setting: CIR Patient has agreed to participate in recommended program. Yes Note that insurance prior authorization may be required for reimbursement for recommended care.  Comment: Rehab RN to follow up.   Ivory Broad, MD     04/03/2012

## 2012-04-03 NOTE — Progress Notes (Signed)
ANTICOAGULATION CONSULT NOTE - Follow Up Consult  Pharmacy Consult for Coumadin/IV Heparin Indication: Afib, Hx L Atrial thrombus  No Known Allergies  Patient Measurements: Height: 5\' 9"  (175.3 cm) Weight: 254 lb 3.1 oz (115.3 kg) IBW/kg (Calculated) : 70.7  Heparin Dosing Weight:   Vital Signs: Temp: 98 F (36.7 C) (12/20 1136) Temp src: Oral (12/20 1136) BP: 104/49 mmHg (12/20 1136) Pulse Rate: 92  (12/20 1136)  Labs:  Basename 04/03/12 1105 04/03/12 0335 04/02/12 2041 04/02/12 0343 04/01/12 0255 03/31/12 2120  HGB -- 10.5* -- 10.9* -- --  HCT -- 32.8* -- 34.6* 35.6* --  PLT -- 142* -- 154 216 --  APTT -- -- -- -- -- --  LABPROT -- 18.1* -- 17.5* 15.6* --  INR -- 1.55* -- 1.48 1.27 --  HEPARINUNFRC 0.45 0.38 0.38 -- -- --  CREATININE -- 2.08* -- -- 1.47* 1.42*  CKTOTAL -- -- -- -- -- --  CKMB -- -- -- -- -- --  TROPONINI -- -- -- -- -- <0.30    Estimated Creatinine Clearance: 39 ml/min (by C-G formula based on Cr of 2.08).   Medications:  Warfarin PTA 4mg  po qam.   Assessment: H76 yoF on coumadin PTA for hx L atrial thrombus, hx afib.  INR subtherapeutic on admit, now on D#3 bridge IV Heparin/Coumadin. Confirmation Heparin Level therapeutic @ 0.45 (Goal 0.3 to 0.7).  INR rising appropriately.  Hgb, plts slowly trending down, no bleeding noted.  SCr bumped overnight 1.47-->2.08 - f/u renal fxn.    Goal of Therapy:  INR 2-3 Heparin level 0.3-0.7 units/ml Monitor platelets by anticoagulation protocol: Yes   Plan:  1.  Continue IV heparin at current rate.   2.  Coumadin 5mg  po x 1.  3.  F/u daily heparin level, PT/INR, CBC, clinical course.     Vasiliy Mccarry E 04/03/2012,11:43 AM

## 2012-04-03 NOTE — Progress Notes (Signed)
ANTICOAGULATION CONSULT NOTE - Follow Up Consult  Pharmacy Consult for IV heparin, Coumadin Indication: h/o atrial fibrillation and L atrial thrombus  No Known Allergies  Patient Measurements: Height: 5\' 9"  (175.3 cm) Weight: 254 lb 3.1 oz (115.3 kg) IBW/kg (Calculated) : 70.7   Vital Signs: Temp: 98.1 F (36.7 C) (12/20 0000) Temp src: Oral (12/20 0000) BP: 115/63 mmHg (12/20 0440) Pulse Rate: 82  (12/20 0440)  Labs:  Basename 04/03/12 0335 04/02/12 2041 04/02/12 1257 04/02/12 0343 04/01/12 0255 03/31/12 2120  HGB 10.5* -- -- 10.9* -- --  HCT 32.8* -- -- 34.6* 35.6* --  PLT 142* -- -- 154 216 --  APTT -- -- -- -- -- --  LABPROT 18.1* -- -- 17.5* 15.6* --  INR 1.55* -- -- 1.48 1.27 --  HEPARINUNFRC 0.38 0.38 0.58 -- -- --  CREATININE 2.08* -- -- -- 1.47* 1.42*  CKTOTAL -- -- -- -- -- --  CKMB -- -- -- -- -- --  TROPONINI -- -- -- -- -- <0.30    Estimated Creatinine Clearance: 39 ml/min (by C-G formula based on Cr of 2.08).   Assessment:  76 yom on Coumadin PTA for h/o L atrial thrombus and history of atrial fibrillation. INR subtherapeutic (1.28) on admit, MD resuming Coumadin along with IV heparin as bridge.  IV heparin currently running at 1000 units/hr, no bleeding/IV interuptions noted by RN.  Heparin level remains therapeutic 0.38 x2.  Goal of Therapy:  INR 2-3 Heparin level 0.3-0.7 units/ml Monitor platelets by anticoagulation protocol: Yes   Plan:   Continue IV heparin @ 1000 units/hr  Recheck heparin level @ 1100 today  Continue warfarin as ordered.   Daily Heparin level, CBC and PT/INR.  Pharmacy will f/u.   Lorenza Evangelist 04/03/2012 5:50 AM

## 2012-04-03 NOTE — Progress Notes (Signed)
Physical Therapy Treatment Patient Details Name: Vincent [name] MRN: [id number] DOB: 07/17/1935 Today's Date: 04/03/2012 Time: 7829-5621 PT Time Calculation (min): 19 min  PT Assessment / Plan / Recommendation Comments on Treatment Session  Pt. is stronger today and tolerated ambulation withm RW Pt. will benefit from post acute rehab, Recommend CIR consult.    Follow Up Recommendations  CIR     Does the patient have the potential to tolerate intense rehabilitation     Barriers to Discharge        Equipment Recommendations  None recommended by PT    Recommendations for Other Services Rehab consult  Frequency Min 3X/week   Plan Discharge plan needs to be updated;Frequency remains appropriate    Precautions / Restrictions Precautions Precautions: Fall   Pertinent Vitals/Pain sats .93%    Mobility  Bed Mobility Supine to Sit: 3: Mod assist;With rails Details for Bed Mobility Assistance: support to pull self to edge of bed. Transfers Sit to Stand: 4: Min assist;From bed;With upper extremity assist Ambulation/Gait Ambulation/Gait Assistance: 4: Min assist Ambulation Distance (Feet): 60 Feet Assistive device: Rolling walker Ambulation/Gait Assistance Details: slow pace, cues for staying inside RW Gait Pattern: Step-through pattern;Decreased stride length;Trunk flexed Gait velocity: decreased    Exercises     PT Diagnosis:    PT Problem List:   PT Treatment Interventions:     PT Goals Acute Rehab PT Goals Pt will go Supine/Side to Sit: with supervision PT Goal: Supine/Side to Sit - Progress: Progressing toward goal Pt will go Sit to Stand: with supervision PT Goal: Sit to Stand - Progress: Progressing toward goal Pt will go Stand to Sit: with supervision PT Goal: Stand to Sit - Progress: Progressing toward goal Pt will Ambulate: 51 - 150 feet;with supervision;with rolling walker PT Goal: Ambulate - Progress: Progressing toward goal  Visit Information  Last PT  Received On: 04/03/12 Assistance Needed: +2    Subjective Data  Subjective: i am a little better.   Cognition  Overall Cognitive Status: Appears within functional limits for tasks assessed/performed Arousal/Alertness: Awake/alert Orientation Level: Appears intact for tasks assessed Behavior During Session: [facility] for tasks performed    Balance     End of Session PT - End of Session Activity Tolerance: Patient limited by fatigue Patient left: in chair;with call bell/phone within reach;with chair alarm set Nurse Communication: Mobility status   GP     Rada Hay 04/03/2012, 1:05 PM

## 2012-04-04 DIAGNOSIS — E119 Type 2 diabetes mellitus without complications: Secondary | ICD-10-CM

## 2012-04-04 DIAGNOSIS — I509 Heart failure, unspecified: Secondary | ICD-10-CM

## 2012-04-04 DIAGNOSIS — I5033 Acute on chronic diastolic (congestive) heart failure: Secondary | ICD-10-CM

## 2012-04-04 DIAGNOSIS — I4891 Unspecified atrial fibrillation: Secondary | ICD-10-CM

## 2012-04-04 LAB — GLUCOSE, CAPILLARY
Glucose-Capillary: 179 mg/dL — ABNORMAL HIGH (ref 70–99)
Glucose-Capillary: 179 mg/dL — ABNORMAL HIGH (ref 70–99)
Glucose-Capillary: 214 mg/dL — ABNORMAL HIGH (ref 70–99)

## 2012-04-04 LAB — CULTURE, RESPIRATORY W GRAM STAIN

## 2012-04-04 LAB — CBC
MCV: 92.7 fL (ref 78.0–100.0)
Platelets: 156 10*3/uL (ref 150–400)
RBC: 3.57 MIL/uL — ABNORMAL LOW (ref 4.22–5.81)
RDW: 16.4 % — ABNORMAL HIGH (ref 11.5–15.5)
WBC: 7 10*3/uL (ref 4.0–10.5)

## 2012-04-04 LAB — BASIC METABOLIC PANEL
CO2: 24 mEq/L (ref 19–32)
Calcium: 8.7 mg/dL (ref 8.4–10.5)
Chloride: 99 mEq/L (ref 96–112)
GFR calc Af Amer: 43 mL/min — ABNORMAL LOW (ref >90)
Sodium: 134 mEq/L — ABNORMAL LOW (ref 135–145)

## 2012-04-04 LAB — PROTIME-INR: INR: 1.63 — ABNORMAL HIGH (ref 0.00–1.49)

## 2012-04-04 LAB — HEPARIN LEVEL (UNFRACTIONATED): Heparin Unfractionated: 0.4 IU/mL (ref 0.30–0.70)

## 2012-04-04 MED ORDER — ALBUTEROL SULFATE (5 MG/ML) 0.5% IN NEBU
2.5000 mg | INHALATION_SOLUTION | RESPIRATORY_TRACT | Status: DC
  Administered 2012-04-04 – 2012-04-06 (×13): 2.5 mg via RESPIRATORY_TRACT
  Filled 2012-04-04 (×15): qty 0.5

## 2012-04-04 MED ORDER — WARFARIN SODIUM 7.5 MG PO TABS
7.5000 mg | ORAL_TABLET | Freq: Once | ORAL | Status: AC
  Administered 2012-04-04: 7.5 mg via ORAL
  Filled 2012-04-04: qty 1

## 2012-04-04 MED ORDER — IPRATROPIUM BROMIDE 0.02 % IN SOLN
0.5000 mg | RESPIRATORY_TRACT | Status: DC
  Administered 2012-04-04 – 2012-04-06 (×13): 0.5 mg via RESPIRATORY_TRACT
  Filled 2012-04-04 (×15): qty 2.5

## 2012-04-04 MED ORDER — METHYLPREDNISOLONE SODIUM SUCC 125 MG IJ SOLR
60.0000 mg | Freq: Two times a day (BID) | INTRAMUSCULAR | Status: DC
  Administered 2012-04-04: 60 mg via INTRAVENOUS
  Administered 2012-04-04: 22:00:00 via INTRAVENOUS
  Filled 2012-04-04 (×5): qty 0.96

## 2012-04-04 MED ORDER — ALBUTEROL SULFATE (5 MG/ML) 0.5% IN NEBU
2.5000 mg | INHALATION_SOLUTION | RESPIRATORY_TRACT | Status: DC | PRN
  Administered 2012-04-04: 2.5 mg via RESPIRATORY_TRACT

## 2012-04-04 MED ORDER — IPRATROPIUM BROMIDE 0.02 % IN SOLN
0.5000 mg | RESPIRATORY_TRACT | Status: DC | PRN
  Administered 2012-04-04: 0.5 mg via RESPIRATORY_TRACT

## 2012-04-04 NOTE — Progress Notes (Signed)
ANTICOAGULATION CONSULT NOTE - Follow Up Consult  Pharmacy Consult for Coumadin/IV Heparin Indication: Afib, Hx L Atrial thrombus  No Known Allergies  Patient Measurements: Height: 5\' 9"  (175.3 cm) Weight: 254 lb 10.1 oz (115.5 kg) IBW/kg (Calculated) : 70.7   Vital Signs: Temp: 98.1 F (36.7 C) (12/21 0606) Temp src: Oral (12/21 0606) BP: 133/64 mmHg (12/21 0606) Pulse Rate: 80  (12/21 0606)  Labs:  Basename 04/04/12 0455 04/03/12 1105 04/03/12 0335 04/02/12 0343  HGB 10.7* -- 10.5* --  HCT 33.1* -- 32.8* 34.6*  PLT 156 -- 142* 154  APTT -- -- -- --  LABPROT 18.8* -- 18.1* 17.5*  INR 1.63* -- 1.55* 1.48  HEPARINUNFRC 0.40 0.45 0.38 --  CREATININE 1.73* -- 2.08* --  CKTOTAL -- -- -- --  CKMB -- -- -- --  TROPONINI -- -- -- --   Estimated Creatinine Clearance: 46.9 ml/min (by C-G formula based on Cr of 1.73).  Medications:  Warfarin PTA 4mg  po qam.   Assessment: 76 yoM on coumadin PTA for hx L atrial thrombus, hx afib. Admit for N/V, rule out PNA (treated for HCAP). INR subtherapeutic on admit, Heparin begun for bridging to therapeutic INR.  D#4 bridge IV Heparin/Coumadin.  Regular diet 12/20, tolerating  Heparin level in therapeutic range (0.40), INR 1.63, CBC wnl, stable.  Goal of Therapy:  INR 2-3 Heparin level 0.3-0.7 units/ml Monitor platelets by anticoagulation protocol: Yes   Plan:  1.  Continue IV heparin at current rate of 1000 units/hr 2.  Coumadin 7.5mg  po x 1.  3.  F/u daily heparin level, PT/INR, CBC, clinical course.    Otho Bellows PharmD Pager [phone] 04/04/2012,7:11 AM

## 2012-04-04 NOTE — Progress Notes (Signed)
[facility] PROGRESS NOTE  Vincent [name] ZOX:[id number] DOB: 1936-02-03 DOA: 03/31/2012 PCP: [name], MD  Brief narrative: 76 year old male with a past medical history of chronic systolic heart failure, EF of 81%, and left atrial thrombus on chronic Coumadin therapy, atrial fibrillation, diabetes, stage III chronic kidney disease recently hospitalized 12/2011 for coagulase-negative staph bacteremia, TEE negative, who presented to the hospital on 03/31/2012 with intractable nausea and vomiting as well as cough. He was also noted to be febrile upon initial presentation. Patient is being treated for healthcare associated pneumonia.   Assessment/Plan:   Principal Problem:  *Healthcare-associated pneumonia  Initially on vancomycin, cefepime, and Levaquin. Now patient is getting Levaquin only.  Repeat chest x-ray 04/02/2012 shows chronic lung changes. Difficult to exclude subtle acute disease in the right lung. Blood cultures and sputum cultures to date are negative HIV antibody, Legionella and strep pneumo negative. Patient is taking Tamiflu for flu  Active Problems:  Shortness of breath  Likely because of pneumonia  Will start nebulizer treatments scheduled every 4 hours and every 2 hours as needed  Will start Solu-Medrol 60 mg every 12 IV Normocytic anemia  Likely secondary to anemia of chronic disease No signs of active bleed Hemoglobin ranges from 10.5-10.9. Urinary tract infection Urine culture growing > 100,000 colonies Enterobacter sensitive to Levaquin Patient is taking Levaquin for her healthcare associated pneumonia as well Hypertension  Blood pressure at goal, 133/64 Continue Cardizem 300 mg daily DM2 (diabetes mellitus, type 2)   Hemoglobin A1c 9.1% indicating poor outpatient control. Continue current insulin regimen with Lantus 15 units at bedtime and Aspart 5 units 3 times a day CBG in past 24 hours are as follows: 179, 179, 168 Leukocytosis  Normalized  with initiation of antibiotics. CKD (chronic kidney disease) stage 3, GFR 30-59 ml/min  Creatinine slowly trending down, today 1.73 LA thrombus  Continue heparin and Coumadin per pharmacy protocol. INR today is 1.63 Nausea and vomiting  Continue anti-nausea medications as needed. Multifocal atrial tachycardia / afib  Continue beta blocker and calcium channel blocker.  Chronic systolic heart failure  Pro BNP elevated but no evidence of decompensated heart failure upon initial presentation. Watch fluid balance closely and reinitiate diuretics when indicated.  Weight trending down. From 119.5 kg 04/02/2012 to 215.5 kg 04/04/2012   Code Status: full  Family Communication: Daughter Annabelle Harman [phone] or [phone]) by telephone today.  Disposition Plan: home when stable.   Medical Consultants:  None. Other Consultants:  None. Anti-infectives:  Cefepime 03/31/2012--->04/03/12  Vancomycin 03/31/2012--->04/03/12  Levaquin 03/31/2012--->   Manson Passey, MD  [facility] Pager [phone]  If 7PM-7AM, please contact night-coverage [email or web address] Password [facility] 04/04/2012, 8:32 AM   LOS: 4 days   HPI/Subjective: No acute overnight events. Feels short of breath while sitting.  Objective: Filed Vitals:   04/03/12 1136 04/03/12 1430 04/03/12 2111 04/04/12 0606  BP: 104/49 119/75 122/57 133/64  Pulse: 92 88 78 80  Temp: 98 F (36.7 C) 97.7 F (36.5 C) 98.1 F (36.7 C) 98.1 F (36.7 C)  TempSrc: Oral Oral Oral Oral  Resp: 20 20 18 18   Height:      Weight:    115.5 kg (254 lb 10.1 oz)  SpO2: 97% 100% 99% 94%    Intake/Output Summary (Last 24 hours) at 04/04/12 0832 Last data filed at 04/04/12 0609  Gross per 24 hour  Intake    583 ml  Output   1450 ml  Net   -867 ml    Exam:   General:  Pt is alert, follows commands appropriately, not in acute distress  Cardiovascular: Irregular rhythm, rate controlled, S1/S2 appreciated  Respiratory: audible wheezing, rhonchi on  ausculation  Abdomen: Soft, non tender, non distended, bowel sounds present, no guarding  Extremities: No edema, pulses DP and PT palpable bilaterally  Neuro: Grossly nonfocal  Data Reviewed: Basic Metabolic Panel:  Lab 04/04/12 2130 04/03/12 0335 04/01/12 0255 03/31/12 2120  NA 134* 133* 135 136  K 3.7 4.1 3.9 4.0  CL 99 100 99 99  CO2 24 25 29 25   GLUCOSE 171* 146* 84 158*  BUN 33* 39* 22 23  CREATININE 1.73* 2.08* 1.47* 1.42*  CALCIUM 8.7 8.2* 9.2 9.6   Liver Function Tests:  Lab 04/01/12 0255 03/31/12 2120  AST 28 38*  ALT 23 25  ALKPHOS 104 119*  BILITOT 0.6 0.6  PROT 7.7 8.2  ALBUMIN 2.9* 3.2*    Lab 03/31/12 2120  LIPASE 22  AMYLASE --   CBC:  Lab 04/04/12 0455 04/03/12 0335 04/02/12 0343 04/01/12 0255 03/31/12 2120  WBC 7.0 6.2 7.1 8.4 11.7*  HGB 10.7* 10.5* 10.9* 11.6* 12.2*  HCT 33.1* 32.8* 34.6* 35.6* 37.2*  MCV 92.7 94.0 94.5 93.2 93.2  PLT 156 142* 154 216 199   Cardiac Enzymes:  Lab 03/31/12 2120  CKTOTAL --  CKMB --  CKMBINDEX --  TROPONINI <0.30   CBG:  Lab 04/04/12 0756 04/03/12 2110 04/03/12 1605 04/03/12 1158 04/03/12 0750  GLUCAP 179* 179* 168* 139* 102*    CULTURE, BLOOD (ROUTINE X 2)     Status: Normal (Preliminary result)   Collection Time   03/31/12 10:20 PM      Component Value Range Status Comment   Culture     Final    Value:        BLOOD CULTURE RECEIVED NO GROWTH TO DATE   Report Status PENDING   Incomplete   CULTURE, BLOOD (ROUTINE X 2)     Status: Normal (Preliminary result)   Collection Time   03/31/12 10:30 PM      Component Value Range Status Comment   Culture     Final    Value:        BLOOD CULTURE RECEIVED NO GROWTH TO DATE    Report Status PENDING   Incomplete   URINE CULTURE     Status: Normal   Collection Time   03/31/12 10:42 PM      Component Value Range Status Comment   Specimen Description URINE  Final    Culture ENTEROBACTER CLOACAE   Final    Report Status 04/02/2012 FINAL   Final    Organism  ID, Bacteria ENTEROBACTER CLOACAE   Final   MRSA PCR SCREENING     Status: Normal   Collection Time   04/01/12  1:19 AM      Component Value Range Status Comment   MRSA by PCR NEGATIVE  NEGATIVE Final   CULTURE, RESPIRATORY     Status: Normal (Preliminary result)   Collection Time   04/01/12  8:00 PM      Component Value Range Status Comment   Specimen Description SPUTUM   Final    Culture NORMAL OROPHARYNGEAL FLORA   Final    Report Status PENDING   Incomplete      Studies: Dg Chest Port 1 View 04/03/2012  * IMPRESSION: Chronic lung changes.  Difficult to exclude subtle acute disease in the right lung.      Scheduled Meds:  . allopurinol  150  mg Oral q morning - 10a  . diltiazem  300 mg Oral q morning - 10a  . feeding supplement  1 Container Oral TID BM  . insulin aspart  0-5 Units Subcutaneous QHS  . insulin aspart  0-9 Units Subcutaneous TID WC  . insulin aspart  3 Units Subcutaneous TID WC  . insulin glargine  15 Units Subcutaneous QHS  . oseltamivir  75 mg Oral BID  . warfarin  7.5 mg Oral Once   Continuous Infusions:  . heparin 1,000 Units/hr (04/03/12 2311)

## 2012-04-05 DIAGNOSIS — I5189 Other ill-defined heart diseases: Secondary | ICD-10-CM

## 2012-04-05 DIAGNOSIS — N183 Chronic kidney disease, stage 3 unspecified: Secondary | ICD-10-CM

## 2012-04-05 LAB — GLUCOSE, CAPILLARY
Glucose-Capillary: 487 mg/dL — ABNORMAL HIGH (ref 70–99)
Glucose-Capillary: 485 mg/dL — ABNORMAL HIGH (ref 70–99)

## 2012-04-05 LAB — HEPARIN LEVEL (UNFRACTIONATED): Heparin Unfractionated: 0.37 IU/mL (ref 0.30–0.70)

## 2012-04-05 LAB — PROTIME-INR
INR: 1.75 — ABNORMAL HIGH (ref 0.00–1.49)
Prothrombin Time: 19.8 seconds — ABNORMAL HIGH (ref 11.6–15.2)

## 2012-04-05 MED ORDER — INSULIN ASPART 100 UNIT/ML ~~LOC~~ SOLN
5.0000 [IU] | Freq: Three times a day (TID) | SUBCUTANEOUS | Status: DC
  Administered 2012-04-05 – 2012-04-06 (×5): 5 [IU] via SUBCUTANEOUS

## 2012-04-05 MED ORDER — METHYLPREDNISOLONE SODIUM SUCC 125 MG IJ SOLR
80.0000 mg | Freq: Three times a day (TID) | INTRAMUSCULAR | Status: DC
  Administered 2012-04-05 – 2012-04-06 (×3): 80 mg via INTRAVENOUS
  Filled 2012-04-05 (×6): qty 1.28

## 2012-04-05 MED ORDER — INSULIN ASPART 100 UNIT/ML ~~LOC~~ SOLN: 7.0000 [IU] | Freq: Once | SUBCUTANEOUS | Status: DC

## 2012-04-05 MED ORDER — INSULIN GLARGINE 100 UNIT/ML ~~LOC~~ SOLN
19.0000 [IU] | Freq: Every day | SUBCUTANEOUS | Status: DC
  Administered 2012-04-05: 19 [IU] via SUBCUTANEOUS

## 2012-04-05 MED ORDER — WARFARIN SODIUM 4 MG PO TABS
8.0000 mg | ORAL_TABLET | Freq: Once | ORAL | Status: DC
  Administered 2012-04-05: 8 mg via ORAL
  Filled 2012-04-05: qty 2

## 2012-04-05 NOTE — Progress Notes (Signed)
Met with Pt and provided him with bed offers.  Pt stating that he's hopeful for CIR; [facility] will be 2nd choice.  Pt asking that CSW notifiy his son-in-law, Wandra Mannan, CSW supervisor.  CSW thanked Pt for his time.  Notified Pt's son, Wandra Mannan.  CSW to continue to follow.  Providence Crosby, LCSWA Clinical Social Work [phone]

## 2012-04-05 NOTE — Progress Notes (Signed)
ANTICOAGULATION CONSULT NOTE - Follow Up Consult  Pharmacy Consult for Coumadin/IV Heparin Indication: Afib, Hx L Atrial thrombus  No Known Allergies  Patient Measurements: Height: 5\' 9"  (175.3 cm) Weight: 254 lb 10.1 oz (115.5 kg) IBW/kg (Calculated) : 70.7   Vital Signs: Temp: 96.9 F (36.1 C) (12/22 0701) Temp src: Oral (12/22 0701) BP: 137/72 mmHg (12/22 0701) Pulse Rate: 102  (12/22 0701)  Labs:  Basename 04/05/12 0452 04/04/12 0455 04/03/12 1105 04/03/12 0335  HGB -- 10.7* -- 10.5*  HCT -- 33.1* -- 32.8*  PLT -- 156 -- 142*  APTT -- -- -- --  LABPROT 19.8* 18.8* -- 18.1*  INR 1.75* 1.63* -- 1.55*  HEPARINUNFRC 0.37 0.40 0.45 --  CREATININE -- 1.73* -- 2.08*  CKTOTAL -- -- -- --  CKMB -- -- -- --  TROPONINI -- -- -- --   Estimated Creatinine Clearance: 46.9 ml/min (by C-G formula based on Cr of 1.73).  Medications:  Warfarin PTA 4mg  po qam.   Assessment: 76 yoM on coumadin PTA for hx L atrial thrombus, hx afib. Admit for N/V, rule out PNA (treated for HCAP). INR subtherapeutic on admit, Heparin begun for bridging to therapeutic INR.  D#5 bridge IV Heparin/Coumadin.  Regular diet 12/20, tolerating  Coumadin doses (12/18-12/21) 6mg ,5mg ,5mg ,7.5mg .  Received Levaquin x 3 days 12/17-12/19; no apparent effect on INR, level increasing slowly.  Heparin level in therapeutic range (0.37), INR 1.75, CBC wnl, stable.  Goal of Therapy:  INR 2-3 Heparin level 0.3-0.7 units/ml Monitor platelets by anticoagulation protocol: Yes   Plan:  1.  Continue IV heparin at current rate of 1000 units/hr 2.  Coumadin 8mg  po x 1.  3.  F/u daily heparin level, PT/INR, CBC, clinical course.    Otho Bellows PharmD Pager [phone] 04/05/2012,8:36 AM

## 2012-04-05 NOTE — Progress Notes (Addendum)
[facility] PROGRESS NOTE  Vincent [name] QMV:[id number] DOB: 1935/09/13 DOA: 03/31/2012 PCP: [name], MD  Brief narrative: 76 year old male with a past medical history of chronic systolic heart failure, EF of 28%, and left atrial thrombus on chronic Coumadin therapy, atrial fibrillation, diabetes, stage III chronic kidney disease recently hospitalized 12/2011 for coagulase-negative staph bacteremia, TEE negative, who presented to the hospital on 03/31/2012 with intractable nausea and vomiting as well as fever and cough. Patient is being treated for healthcare associated pneumonia.   Assessment/Plan:   Principal Problem:  *Healthcare-associated pneumonia  Initially on vancomycin, cefepime, and Levaquin. Now patient is getting Levaquin only.  Repeat chest x-ray 04/02/2012 shows chronic lung changes. Difficult to exclude subtle acute disease in the right lung.  Blood cultures and sputum cultures to date are negative  HIV antibody, Legionella and strep pneumo negative.  Patient is taking Tamiflu for flu  Active Problems:  Shortness of breath   Likely because of pneumonia   Continue current nebulizer treatments scheduled every 4 hours and every 2 hours as needed   We will increase steroids to 80 mg every 8 hours IV Normocytic anemia  Likely secondary to anemia of chronic disease  No signs of active bleed  Hemoglobin ranges from 10.5-10.7 Urinary tract infection  Urine culture growing > 100,000 colonies Enterobacter sensitive to Levaquin  Patient is taking Levaquin for her healthcare associated pneumonia as well Hypertension  Blood pressure at goal, 137/72  Continue Cardizem 300 mg daily DM2 (diabetes mellitus, type 2)  Hemoglobin A1c 9.1% indicating poor outpatient control.  Continue current insulin regimen with Lantus 19 units at bedtime and Aspart 5 units 3 times a day  CBG in past 24 hours are as follows: 348, 214 which could also be exacerbated with steroids. We  will increase insulin regimen as indicated above Leukocytosis  Normalized with initiation of antibiotics. CKD (chronic kidney disease) stage 3, GFR 30-59 ml/min  Creatinine slowly trending down Followup BMP this morning LA thrombus  Continue heparin and Coumadin per pharmacy protocol.  INR today is 1.7 Nausea and vomiting  Continue anti-nausea medications as needed. Patient tolerates regular diet Multifocal atrial tachycardia / afib  Continue Cardizem 300 mg daily  Chronic systolic heart failure  Pro BNP elevated but no evidence of decompensated heart failure upon initial presentation. Watch fluid balance closely and reinitiate diuretics when indicated.  Weight trending down. From 119.5 kg 04/02/2012 to 115.5 kg 04/04/2012   Code Status: full  Family Communication: Daughter Annabelle Harman [phone] or [phone]) at bedside Disposition Plan: home when stable.   Medical Consultants:  None. Other Consultants:  None. Anti-infectives:  Cefepime 03/31/2012--->04/03/12  Vancomycin 03/31/2012--->04/03/12  Levaquin 03/31/2012--->   Manson Passey, MD  [facility]  Pager [phone]  If 7PM-7AM, please contact night-coverage [email or web address] Password [email or web address] 04/05/2012, 7:00 AM   LOS: 5 days   HPI/Subjective: No acute overnight events. Patient reports feeling little bit better today.  Objective: Filed Vitals:   04/04/12 1433 04/04/12 1950 04/04/12 2228 04/04/12 2320  BP: 131/75  124/68   Pulse: 63  86   Temp: 97.9 F (36.6 C)  97.5 F (36.4 C)   TempSrc: Oral  Oral   Resp: 18  20   Height:      Weight:      SpO2: 97% 96% 96% 98%    Intake/Output Summary (Last 24 hours) at 04/05/12 0700 Last data filed at 04/05/12 0600  Gross per 24 hour  Intake   2870 ml  Output   2150  ml  Net    720 ml    Exam:   General:  Pt is alert, follows commands appropriately, not in acute distress  Cardiovascular: irregular rhythm, rate controlled, S1/S2, no murmurs, no rubs, no gallops  Respiratory:  Audible wheezing throughout  Abdomen: Soft, non tender, non distended, bowel sounds present, no guarding  Extremities: No edema, pulses DP and PT palpable bilaterally  Neuro: Grossly nonfocal  Data Reviewed: Basic Metabolic Panel:  Lab 04/04/12 8413 04/03/12 0335 04/01/12 0255 03/31/12 2120  NA 134* 133* 135 136  K 3.7 4.1 3.9 4.0  CL 99 100 99 99  CO2 24 25 29 25   GLUCOSE 171* 146* 84 158*  BUN 33* 39* 22 23  CREATININE 1.73* 2.08* 1.47* 1.42*  CALCIUM 8.7 8.2* 9.2 9.6  MG 1.9 -- -- --   Liver Function Tests:  Lab 04/01/12 0255 03/31/12 2120  AST 28 38*  ALT 23 25  ALKPHOS 104 119*  BILITOT 0.6 0.6  PROT 7.7 8.2  ALBUMIN 2.9* 3.2*    Lab 03/31/12 2120  LIPASE 22  AMYLASE --   CBC:  Lab 04/04/12 0455 04/03/12 0335 04/02/12 0343 04/01/12 0255 03/31/12 2120  WBC 7.0 6.2 7.1 8.4 11.7*  HGB 10.7* 10.5* 10.9* 11.6* 12.2*  HCT 33.1* 32.8* 34.6* 35.6* 37.2*  MCV 92.7 94.0 94.5 93.2 93.2  PLT 156 142* 154 216 199   Cardiac Enzymes:  Lab 03/31/12 2120  CKTOTAL --  CKMB --  CKMBINDEX --  TROPONINI <0.30   CBG:  Lab 04/04/12 1643 04/04/12 1202 04/04/12 0756 04/03/12 2110 04/03/12 1605  GLUCAP 348* 214* 179* 179* 168*    CULTURE, BLOOD (ROUTINE X 2)     Status: Normal (Preliminary result)   Collection Time   03/31/12 10:20 PM      Component Value Range Status Comment   Culture     Final    Value:        BLOOD CULTURE RECEIVED NO GROWTH TO DATE    Report Status PENDING   Incomplete   CULTURE, BLOOD (ROUTINE X 2)     Status: Normal (Preliminary result)   Collection Time   03/31/12 10:30 PM      Component Value Range Status Comment   Culture     Final    Value:        BLOOD CULTURE RECEIVED NO GROWTH TO DATE    Report Status PENDING   Incomplete   URINE CULTURE     Status: Normal   Collection Time   03/31/12 10:42 PM      Component Value Range Status Comment   Specimen Description URINE  Final    Culture ENTEROBACTER CLOACAE   Final    Report Status  04/02/2012 FINAL   Final   MRSA PCR SCREENING     Status: Normal   MRSA by PCR NEGATIVE  NEGATIVE Final    Report Status 04/01/2012 FINAL   Final   CULTURE, RESPIRATORY     Status: Normal   Collection Time   04/01/12  8:00 PM      Component Value Range Status Comment   Specimen Description SPUTUM   Final    Culture NORMAL OROPHARYNGEAL FLORA   Final    Report Status 04/04/2012 FINAL   Final      Studies: No results found.  Scheduled Meds:   . albuterol  2.5 mg Nebulization Q4H  . allopurinol  150 mg Oral q morning - 10a  . diltiazem  300  mg Oral q morning - 10a  . feeding supplement  1 Container Oral TID BM  . insulin aspart  0-5 Units Subcutaneous QHS  . insulin aspart  0-9 Units Subcutaneous TID WC  . insulin aspart  3 Units Subcutaneous TID WC  . insulin glargine  15 Units Subcutaneous QHS  . ipratropium  0.5 mg Nebulization Q4H  . methylPREDNISolone   80 mg Intravenous Q8H  . oseltamivir  75 mg Oral BID   Continuous Infusions:   . heparin 1,000 Units/hr (04/03/12 2311)

## 2012-04-06 ENCOUNTER — Inpatient Hospital Stay (HOSPITAL_COMMUNITY)
Admission: RE | Admit: 2012-04-06 | Discharge: 2012-04-11 | DRG: 945 | Disposition: A | Discharge status: Home Health | Payer: Medicare Other | Source: Ambulatory Visit | Attending: Physical Medicine & Rehabilitation | Admitting: Physical Medicine & Rehabilitation

## 2012-04-06 DIAGNOSIS — J189 Pneumonia, unspecified organism: Secondary | ICD-10-CM | POA: Diagnosis present

## 2012-04-06 DIAGNOSIS — K219 Gastro-esophageal reflux disease without esophagitis: Secondary | ICD-10-CM | POA: Diagnosis present

## 2012-04-06 DIAGNOSIS — I509 Heart failure, unspecified: Secondary | ICD-10-CM | POA: Diagnosis present

## 2012-04-06 DIAGNOSIS — R5381 Other malaise: Secondary | ICD-10-CM | POA: Diagnosis present

## 2012-04-06 DIAGNOSIS — I5032 Chronic diastolic (congestive) heart failure: Secondary | ICD-10-CM | POA: Diagnosis present

## 2012-04-06 DIAGNOSIS — Z7901 Long term (current) use of anticoagulants: Secondary | ICD-10-CM

## 2012-04-06 DIAGNOSIS — I129 Hypertensive chronic kidney disease with stage 1 through stage 4 chronic kidney disease, or unspecified chronic kidney disease: Secondary | ICD-10-CM | POA: Diagnosis present

## 2012-04-06 DIAGNOSIS — E119 Type 2 diabetes mellitus without complications: Secondary | ICD-10-CM | POA: Diagnosis present

## 2012-04-06 DIAGNOSIS — G9340 Encephalopathy, unspecified: Secondary | ICD-10-CM | POA: Diagnosis present

## 2012-04-06 DIAGNOSIS — Z9861 Coronary angioplasty status: Secondary | ICD-10-CM

## 2012-04-06 DIAGNOSIS — I4891 Unspecified atrial fibrillation: Secondary | ICD-10-CM | POA: Diagnosis present

## 2012-04-06 DIAGNOSIS — C61 Malignant neoplasm of prostate: Secondary | ICD-10-CM | POA: Diagnosis present

## 2012-04-06 DIAGNOSIS — N183 Chronic kidney disease, stage 3 unspecified: Secondary | ICD-10-CM | POA: Diagnosis present

## 2012-04-06 DIAGNOSIS — Z5189 Encounter for other specified aftercare: Principal | ICD-10-CM

## 2012-04-06 LAB — HEPARIN LEVEL (UNFRACTIONATED): Heparin Unfractionated: 0.41 IU/mL (ref 0.30–0.70)

## 2012-04-06 LAB — CBC
Hemoglobin: 10.8 g/dL — ABNORMAL LOW (ref 13.0–17.0)
MCH: 29.4 pg (ref 26.0–34.0)
MCHC: 32.1 g/dL (ref 30.0–36.0)
Platelets: 157 10*3/uL (ref 150–400)
RBC: 3.67 MIL/uL — ABNORMAL LOW (ref 4.22–5.81)

## 2012-04-06 LAB — GLUCOSE, CAPILLARY
Glucose-Capillary: 422 mg/dL — ABNORMAL HIGH (ref 70–99)
Glucose-Capillary: 460 mg/dL — ABNORMAL HIGH (ref 70–99)

## 2012-04-06 LAB — PROTIME-INR
INR: 1.84 — ABNORMAL HIGH (ref 0.00–1.49)
Prothrombin Time: 20.6 seconds — ABNORMAL HIGH (ref 11.6–15.2)

## 2012-04-06 MED ORDER — WARFARIN SODIUM 4 MG PO TABS
8.0000 mg | ORAL_TABLET | Freq: Once | ORAL | Status: DC
  Administered 2012-04-06: 8 mg via ORAL
  Filled 2012-04-06: qty 2

## 2012-04-06 MED ORDER — WARFARIN - PHARMACIST DOSING INPATIENT: Freq: Every day | Status: DC

## 2012-04-06 MED ORDER — ONDANSETRON HCL 4 MG PO TABS: 4.0000 mg | ORAL_TABLET | Freq: Four times a day (QID) | ORAL | Status: DC | PRN

## 2012-04-06 MED ORDER — LEVOFLOXACIN 750 MG PO TABS: 750.0000 mg | ORAL_TABLET | Freq: Every day | ORAL | Status: DC

## 2012-04-06 MED ORDER — DILTIAZEM HCL ER BEADS 300 MG PO CP24: 300.0000 mg | ORAL_CAPSULE | Freq: Every morning | ORAL | Status: DC

## 2012-04-06 MED ORDER — ALBUTEROL SULFATE (5 MG/ML) 0.5% IN NEBU
2.5000 mg | INHALATION_SOLUTION | RESPIRATORY_TRACT | Status: DC | PRN
  Filled 2012-04-06: qty 0.5

## 2012-04-06 MED ORDER — POLYETHYLENE GLYCOL 3350 17 G PO PACK
17.0000 g | PACK | Freq: Every day | ORAL | Status: DC | PRN
  Filled 2012-04-06: qty 1

## 2012-04-06 MED ORDER — PREDNISONE 50 MG PO TABS: ORAL_TABLET | ORAL | Status: DC

## 2012-04-06 MED ORDER — INSULIN ASPART 100 UNIT/ML ~~LOC~~ SOLN: 0.0000 [IU] | Freq: Three times a day (TID) | SUBCUTANEOUS | Status: DC

## 2012-04-06 MED ORDER — INSULIN ASPART 100 UNIT/ML ~~LOC~~ SOLN
0.0000 [IU] | Freq: Every day | SUBCUTANEOUS | Status: DC
  Administered 2012-04-06: 4 [IU] via SUBCUTANEOUS
  Administered 2012-04-08 – 2012-04-09 (×2): 2 [IU] via SUBCUTANEOUS
  Administered 2012-04-10: 5 [IU] via SUBCUTANEOUS

## 2012-04-06 MED ORDER — IPRATROPIUM BROMIDE 0.02 % IN SOLN: 0.5000 mg | RESPIRATORY_TRACT | Status: DC | PRN

## 2012-04-06 MED ORDER — INSULIN ASPART 100 UNIT/ML ~~LOC~~ SOLN: 0.0000 [IU] | Freq: Every day | SUBCUTANEOUS | Status: DC

## 2012-04-06 MED ORDER — ENOXAPARIN (LOVENOX) PATIENT EDUCATION KIT
PACK | Freq: Once | Status: DC
  Filled 2012-04-06: qty 1

## 2012-04-06 MED ORDER — POTASSIUM CHLORIDE CRYS ER 20 MEQ PO TBCR
20.0000 meq | EXTENDED_RELEASE_TABLET | Freq: Two times a day (BID) | ORAL | Status: DC
  Administered 2012-04-06 – 2012-04-11 (×10): 20 meq via ORAL
  Filled 2012-04-06 (×12): qty 1

## 2012-04-06 MED ORDER — ENOXAPARIN SODIUM 120 MG/0.8ML ~~LOC~~ SOLN: 30.0000 mg | Freq: Two times a day (BID) | SUBCUTANEOUS | Status: DC

## 2012-04-06 MED ORDER — BIOTENE DRY MOUTH MT LIQD
15.0000 mL | Freq: Two times a day (BID) | OROMUCOSAL | Status: DC
  Administered 2012-04-06 – 2012-04-11 (×7): 15 mL via OROMUCOSAL

## 2012-04-06 MED ORDER — INSULIN ASPART 100 UNIT/ML ~~LOC~~ SOLN
10.0000 [IU] | Freq: Three times a day (TID) | SUBCUTANEOUS | Status: DC
  Administered 2012-04-06 – 2012-04-08 (×7): 10 [IU] via SUBCUTANEOUS

## 2012-04-06 MED ORDER — IPRATROPIUM BROMIDE 0.02 % IN SOLN
0.5000 mg | RESPIRATORY_TRACT | Status: DC
  Administered 2012-04-06: 0.5 mg via RESPIRATORY_TRACT
  Filled 2012-04-06: qty 2.5

## 2012-04-06 MED ORDER — ALBUTEROL SULFATE (5 MG/ML) 0.5% IN NEBU: 2.5000 mg | INHALATION_SOLUTION | RESPIRATORY_TRACT | Status: DC | PRN

## 2012-04-06 MED ORDER — IPRATROPIUM BROMIDE 0.02 % IN SOLN
0.5000 mg | RESPIRATORY_TRACT | Status: DC | PRN
  Filled 2012-04-06: qty 2.5

## 2012-04-06 MED ORDER — ONDANSETRON HCL 4 MG/2ML IJ SOLN: 4.0000 mg | Freq: Four times a day (QID) | INTRAMUSCULAR | Status: DC | PRN

## 2012-04-06 MED ORDER — SORBITOL 70 % SOLN: 30.0000 mL | Freq: Every day | Status: DC | PRN

## 2012-04-06 MED ORDER — ENSURE PUDDING PO PUDG: 1.0000 | Freq: Three times a day (TID) | ORAL | Status: DC

## 2012-04-06 MED ORDER — OSELTAMIVIR PHOSPHATE 75 MG PO CAPS: 75.0000 mg | ORAL_CAPSULE | Freq: Two times a day (BID) | ORAL | Status: AC

## 2012-04-06 MED ORDER — ALLOPURINOL 150 MG HALF TABLET
150.0000 mg | ORAL_TABLET | Freq: Every day | ORAL | Status: DC
  Administered 2012-04-07 – 2012-04-11 (×5): 150 mg via ORAL
  Filled 2012-04-06 (×6): qty 1

## 2012-04-06 MED ORDER — FUROSEMIDE 40 MG PO TABS
40.0000 mg | ORAL_TABLET | Freq: Two times a day (BID) | ORAL | Status: DC
  Administered 2012-04-06 – 2012-04-11 (×10): 40 mg via ORAL
  Filled 2012-04-06 (×13): qty 1

## 2012-04-06 MED ORDER — LEVOFLOXACIN IN D5W 750 MG/150ML IV SOLN
750.0000 mg | INTRAVENOUS | Status: DC
  Filled 2012-04-06: qty 150

## 2012-04-06 MED ORDER — WARFARIN SODIUM 4 MG PO TABS: 8.0000 mg | ORAL_TABLET | Freq: Once | ORAL | Status: DC

## 2012-04-06 MED ORDER — HEPARIN (PORCINE) IN NACL 100-0.45 UNIT/ML-% IJ SOLN
1000.0000 [IU]/h | INTRAMUSCULAR | Status: DC
  Filled 2012-04-06: qty 250

## 2012-04-06 MED ORDER — ALBUTEROL SULFATE (5 MG/ML) 0.5% IN NEBU
2.5000 mg | INHALATION_SOLUTION | RESPIRATORY_TRACT | Status: DC
  Administered 2012-04-06: 2.5 mg via RESPIRATORY_TRACT
  Filled 2012-04-06: qty 0.5

## 2012-04-06 MED ORDER — ACETAMINOPHEN 325 MG PO TABS: 325.0000 mg | ORAL_TABLET | ORAL | Status: DC | PRN

## 2012-04-06 MED ORDER — INSULIN ASPART 100 UNIT/ML ~~LOC~~ SOLN: 5.0000 [IU] | Freq: Three times a day (TID) | SUBCUTANEOUS | Status: DC

## 2012-04-06 MED ORDER — ENOXAPARIN SODIUM 120 MG/0.8ML ~~LOC~~ SOLN
1.0000 mg/kg | Freq: Two times a day (BID) | SUBCUTANEOUS | Status: DC
  Administered 2012-04-06: 115 mg via SUBCUTANEOUS
  Filled 2012-04-06 (×2): qty 0.8

## 2012-04-06 MED ORDER — WARFARIN SODIUM 4 MG PO TABS
8.0000 mg | ORAL_TABLET | Freq: Once | ORAL | Status: DC
  Filled 2012-04-06: qty 2

## 2012-04-06 MED ORDER — INSULIN GLARGINE 100 UNIT/ML ~~LOC~~ SOLN
19.0000 [IU] | Freq: Every day | SUBCUTANEOUS | Status: DC
  Administered 2012-04-06: 19 [IU] via SUBCUTANEOUS

## 2012-04-06 MED ORDER — OSELTAMIVIR PHOSPHATE 75 MG PO CAPS
75.0000 mg | ORAL_CAPSULE | Freq: Two times a day (BID) | ORAL | Status: DC
  Administered 2012-04-06 – 2012-04-07 (×2): 75 mg via ORAL
  Filled 2012-04-06 (×4): qty 1

## 2012-04-06 MED ORDER — ACETAMINOPHEN 325 MG PO TABS: 650.0000 mg | ORAL_TABLET | Freq: Four times a day (QID) | ORAL | Status: DC | PRN

## 2012-04-06 MED ORDER — ATENOLOL 100 MG PO TABS
100.0000 mg | ORAL_TABLET | Freq: Two times a day (BID) | ORAL | Status: DC
  Administered 2012-04-06 – 2012-04-11 (×10): 100 mg via ORAL
  Filled 2012-04-06 (×12): qty 1

## 2012-04-06 MED ORDER — METHYLPREDNISOLONE SODIUM SUCC 125 MG IJ SOLR: 80.0000 mg | Freq: Two times a day (BID) | INTRAMUSCULAR | Status: DC

## 2012-04-06 MED ORDER — ENOXAPARIN SODIUM 120 MG/0.8ML ~~LOC~~ SOLN
115.0000 mg | Freq: Two times a day (BID) | SUBCUTANEOUS | Status: DC
  Filled 2012-04-06 (×2): qty 0.8

## 2012-04-06 MED ORDER — DILTIAZEM HCL ER COATED BEADS 300 MG PO CP24
300.0000 mg | ORAL_CAPSULE | Freq: Every day | ORAL | Status: DC
  Administered 2012-04-07 – 2012-04-11 (×5): 300 mg via ORAL
  Filled 2012-04-06 (×6): qty 1

## 2012-04-06 MED ORDER — ENSURE PUDDING PO PUDG
1.0000 | Freq: Three times a day (TID) | ORAL | Status: DC
  Administered 2012-04-07 – 2012-04-11 (×9): 1 via ORAL

## 2012-04-06 MED ORDER — BIOTENE DRY MOUTH MT LIQD: 15.0000 mL | Freq: Two times a day (BID) | OROMUCOSAL | Status: DC

## 2012-04-06 MED ORDER — ENOXAPARIN SODIUM 120 MG/0.8ML ~~LOC~~ SOLN
115.0000 mg | Freq: Two times a day (BID) | SUBCUTANEOUS | Status: DC
  Administered 2012-04-06 – 2012-04-07 (×2): 115 mg via SUBCUTANEOUS
  Filled 2012-04-06 (×5): qty 0.8

## 2012-04-06 MED ORDER — LEVOFLOXACIN IN D5W 750 MG/150ML IV SOLN
750.0000 mg | INTRAVENOUS | Status: DC
  Administered 2012-04-06: 750 mg via INTRAVENOUS
  Filled 2012-04-06: qty 150

## 2012-04-06 MED ORDER — METHYLPREDNISOLONE SODIUM SUCC 125 MG IJ SOLR
80.0000 mg | Freq: Three times a day (TID) | INTRAMUSCULAR | Status: DC
  Administered 2012-04-06 – 2012-04-07 (×2): 80 mg via INTRAVENOUS
  Filled 2012-04-06 (×5): qty 1.28

## 2012-04-06 MED ORDER — INSULIN ASPART 100 UNIT/ML ~~LOC~~ SOLN
0.0000 [IU] | Freq: Three times a day (TID) | SUBCUTANEOUS | Status: DC
  Administered 2012-04-06 – 2012-04-07 (×2): 20 [IU] via SUBCUTANEOUS
  Administered 2012-04-07: 7 [IU] via SUBCUTANEOUS
  Administered 2012-04-07: 15 [IU] via SUBCUTANEOUS
  Administered 2012-04-08: 7 [IU] via SUBCUTANEOUS
  Administered 2012-04-08: 11 [IU] via SUBCUTANEOUS
  Administered 2012-04-08: 7 [IU] via SUBCUTANEOUS
  Administered 2012-04-09: 11 [IU] via SUBCUTANEOUS
  Administered 2012-04-09 – 2012-04-10 (×2): 4 [IU] via SUBCUTANEOUS
  Administered 2012-04-10: 11 [IU] via SUBCUTANEOUS
  Administered 2012-04-10 – 2012-04-11 (×2): 4 [IU] via SUBCUTANEOUS

## 2012-04-06 NOTE — Progress Notes (Addendum)
ANTICOAGULATION CONSULT NOTE - Initial Consult  Pharmacy Consult for Heparin and Coumadin Indication: atrial fibrillation and hx LA thrombus  No Known Allergies  Patient Measurements:   Height = 69 inches Weight = 116 kg Heparin Dosing Weight: 96 kg  Vital Signs: Temp: 98.1 F (36.7 C) (12/23 1300) Temp src: Oral (12/23 1300) BP: 126/74 mmHg (12/23 1300) Pulse Rate: 85  (12/23 1300)  Labs:  Basename 04/06/12 0430 04/05/12 0452 04/04/12 0455  HGB 10.8* -- 10.7*  HCT 33.6* -- 33.1*  PLT 157 -- 156  APTT -- -- --  LABPROT 20.6* 19.8* 18.8*  INR 1.84* 1.75* 1.63*  HEPARINUNFRC 0.41 0.37 0.40  CREATININE -- -- 1.73*  CKTOTAL -- -- --  CKMB -- -- --  TROPONINI -- -- --    CrCl ~ 35 ml/min   Medical History: Past Medical History  Diagnosis Date  . Diabetes mellitus   . Hypertension   . CHF (congestive heart failure)   . Cancer   . Pneumonia   . Irregular heartbeat   . Prostate cancer   . Shortness of breath   . GERD (gastroesophageal reflux disease)   . Acute on chronic diastolic CHF (congestive heart failure) 12/18/2011  . Atrial fibrillation with rapid ventricular response, history of PAF was in [facility] in 08/2011 12/18/2011  . CKD (chronic kidney disease) stage 3, GFR 30-59 ml/min 12/26/2011    Medications:  Scheduled:    . albuterol  2.5 mg Nebulization Q4H  . allopurinol  150 mg Oral Daily  . antiseptic oral rinse  15 mL Mouth Rinse BID  . atenolol  100 mg Oral BID  . diltiazem  300 mg Oral Daily  . feeding supplement  1 Container Oral TID BM  . furosemide  40 mg Oral BID  . insulin aspart  0-20 Units Subcutaneous TID WC  . insulin aspart  0-5 Units Subcutaneous QHS  . insulin aspart  10 Units Subcutaneous TID WC  . insulin glargine  19 Units Subcutaneous QHS  . ipratropium  0.5 mg Nebulization Q4H  . levofloxacin (LEVAQUIN) IV  750 mg Intravenous Q24H  . methylPREDNISolone (SOLU-MEDROL) injection  80 mg Intravenous Q8H  . oseltamivir  75 mg Oral BID  .  potassium chloride  20 mEq Oral BID  . Warfarin - Pharmacist Dosing Inpatient   Does not apply q1800  . [DISCONTINUED] diltiazem  300 mg Oral q morning - 10a  . [DISCONTINUED] insulin aspart  0-5 Units Subcutaneous QHS  . [DISCONTINUED] insulin aspart  5 Units Subcutaneous TID WC  . [DISCONTINUED] warfarin  8 mg Oral ONCE-1800  . [DISCONTINUED] warfarin  8 mg Oral Once  . [DISCONTINUED] Warfarin - Pharmacist Dosing Inpatient   Does not apply q1800   Infusions:    . heparin      Assessment: 76 yo M on Coumadin PTA for hx Afib and LA thrombus.  Pt has transferred to [facility] from [facility] for inpatient rehab.  Pt was admitted to [facility] from 12/17-12/23 with HCAP/Flu.  Pt continues on Levaquin and Tamiflu.  On presentation to [facility], INR was subtherapeutic on reported home Coumadin dose of 4mg  daily and pt was started on Heparin for bridging until INR >2.  Pharmacy will continue to manage both heparin and Coumadin now that patient has transferred to [facility].  INR today 1.84 (goal 2-3) and patient has already received Coumadin 8mg  dose today.  Pt continues on heparin infusion at 1000 units/hr with therapeutic level of 0.41 on this rate.  Goal  of Therapy:  INR 2-3 Heparin level 0.3-0.7 units/ml Monitor platelets by anticoagulation protocol: Yes   Plan:  Continue heparin at 1000 units/hr. Daily heparin level, CBC, and INR. No further Coumadin doses needed this evening.  [employer], Pharm.D., BCPS Clinical Pharmacist Pager [phone] 04/06/2012 5:13 PM   Addendum: Pharmacy has been asked to changed the patient to treatment dose Lovenox instead of Heparin to allow for reduced IV infusions and assist in future PT/OT sessions.    1. D/C Heparin. 2. Start Lovenox 115mg  SQ S12h one hour after infusion off.  It appears Lovenox was given at 1249 at [facility].  Will schedule next dose for tonight at 2200. 3. D/C daily heparin level and CBC  Alben Jepsen, Pharm.D., BCPS Clinical Pharmacist Pager  [phone] 04/06/2012 6:05 PM

## 2012-04-06 NOTE — Progress Notes (Signed)
CBG 497, MD made aware ,no additional insulin dose ordered.

## 2012-04-06 NOTE — Discharge Summary (Addendum)
Physician Discharge Summary  Vincent [name] VWU:[id number] DOB: 1935-06-26 DOA: 03/31/2012  PCP: [name], MD  Admit date: 03/31/2012 Discharge date: 04/06/2012  Recommendations for Outpatient Follow-up:  1. With PCP in 2-4 weeks upon discharge or sooner if symptoms worsen  Discharge Diagnoses:  Principal Problem:  *Healthcare-associated pneumonia Active Problems:  Hypertension  DM2 (diabetes mellitus, type 2)  Leukocytosis  CKD (chronic kidney disease) stage 3, GFR 30-59 ml/min  LA thrombus  Nausea and vomiting  Multifocal atrial tachycardia  Chronic systolic heart failure  UTI (lower urinary tract infection)  Normocytic anemia  Discharge Condition: medically stable and clinically appears well for discharge home today  Diet recommendation: carb-modified  History of present illness:  76 year old male with a past medical history of chronic systolic heart failure, EF of 29%, and left atrial thrombus on chronic Coumadin therapy, atrial fibrillation, diabetes, stage III chronic kidney disease recently hospitalized 12/2011 for coagulase-negative staph bacteremia, TEE negative, who presented to the hospital on 03/31/2012 with intractable nausea and vomiting as well as fever and cough. In ED, patient was found to have acute kidney injury as well as influenza pneumonia. Patient was started on tamiflu. For possible community acquired bacterial pneumonia we used Levaquin which will be continued on discharge for next 5 days.  Assessment/Plan:   Principal Problem:  *Healthcare-associated pneumonia  Initially on vancomycin, cefepime, and Levaquin. Now patient is getting Levaquin only.  Repeat chest x-ray 04/02/2012 shows chronic lung changes. Difficult to exclude subtle acute disease in the right lung.  Blood cultures and sputum cultures to date are negative  HIV antibody, Legionella and strep pneumo negative.  Patient is taking Tamiflu for flu  Active Problems:  Shortness of  breath   Likely because of pneumonia   Continue current nebulizer treatments every 2 hours as needed while in rehab  Continue solumedrol 80 mg daily today and 80 mg daily tomorrow 04/07/2012. The start prednisone 50 mg daily by mouth for 5 days and then stop. Normocytic anemia  Likely secondary to anemia of chronic disease  No signs of active bleed  Hemoglobin ranges from 10.5-10.7 Urinary tract infection  Urine culture growing > 100,000 colonies Enterobacter sensitive to Levaquin  Patient is taking Levaquin for her healthcare associated pneumonia as well. Levaquin will be taken for additional 5 days on discharge Hypertension  Blood pressure at goal Continue Cardizem 300 mg daily Continue atenolol DM2 (diabetes mellitus, type 2)  Hemoglobin A1c 9.1% indicating poor outpatient control.  CBG's are in 400's which is likely exacerbated with high dose of steroids We will stop current insulin regimen and divert back to patient's home insulin regimen with NPH 50 units am and 20 units Q pm Continue sliding scale insulin and use coverage for high CBG's Leukocytosis  Normalized with initiation of antibiotics. CKD (chronic kidney disease) stage 3, GFR 30-59 ml/min  Creatinine slowly trending down LA thrombus  INR today is 1.84 Patient was on heparin drip but we will switch to Lovenox 120 mg daily and this should be continued until INR is 2-3. Daily INR checks Nausea and vomiting  Continue anti-nausea medications as needed. Patient tolerates regular carb modified diet Multifocal atrial tachycardia / afib  Continue Cardizem 300 mg daily  Chronic systolic heart failure  Pro BNP elevated but no evidence of decompensated heart failure upon initial presentation. Watch fluid balance closely and reinitiate diuretics when indicated.  Weight trending down. From 119.5 kg 04/02/2012 to 115.5 kg 04/04/2012 Continue lasix  Discharge Exam: Filed Vitals:  04/06/12 0511  BP: 129/90  Pulse: 80   Temp: 97.4 F (36.3 C)  Resp: 20   Filed Vitals:   04/06/12 0020 04/06/12 0327 04/06/12 0511 04/06/12 0850  BP:   129/90   Pulse:   80   Temp:   97.4 F (36.3 C)   TempSrc:   Oral   Resp:   20   Height:      Weight:   116.1 kg (255 lb 15.3 oz)   SpO2: 98% 99% 100% 97%    General: Pt is alert, follows commands appropriately, not in acute distress Cardiovascular: irregular rhythm, rate controlled, S1/S2 +, no murmurs, no rubs, no gallops Respiratory: mild wheezing over upper lobes, improving, no crackles, no rhonchi Abdominal: Soft, non tender, non distended, bowel sounds +, no guarding Extremities: no edema, no cyanosis, pulses palpable bilaterally DP and PT Neuro: Grossly nonfocal  Discharge Instructions  Discharge Orders    Future Orders Please Complete By Expires   Diet - low sodium heart healthy      Increase activity slowly      Discharge instructions      Comments:   Please take solumedrol 80 mg today IV then 04/07/2012 another 80 mg solumedrol IV; starting 04/09/2011 take prednisone 50 mg daily for 5 days  Take Levaquin 750 mg PO for 5 days (end date 04/11/2012)  Please take Lovenox 120 mg daily along with coumadin until INR is 2-3. Please check INR daily until it is 2-3.   Call MD for:  persistant nausea and vomiting      Call MD for:  severe uncontrolled pain      Call MD for:  difficulty breathing, headache or visual disturbances      Call MD for:  persistant dizziness or light-headedness          Medication List     As of 04/06/2012 10:52 AM    STOP taking these medications         cephALEXin 500 MG capsule   Commonly known as: KEFLEX      TAKE these medications         acetaminophen 325 MG tablet   Commonly known as: TYLENOL   Take 2 tablets (650 mg total) by mouth every 6 (six) hours as needed for pain or fever (or Fever >/= 101).      albuterol (5 MG/ML) 0.5% nebulizer solution   Commonly known as: PROVENTIL   Take 0.5 mLs (2.5 mg total) by  nebulization every 4 (four) hours as needed for wheezing or shortness of breath.      allopurinol 100 MG tablet   Commonly known as: ZYLOPRIM   Take 150 mg by mouth every morning.      antiseptic oral rinse Liqd   15 mLs by Mouth Rinse route 2 (two) times daily.      atenolol 100 MG tablet   Commonly known as: TENORMIN   Take 100 mg by mouth 2 (two) times daily.      diltiazem 300 MG 24 hr capsule   Commonly known as: TIAZAC   Take 300 mg by mouth every morning.      enoxaparin 120 MG/0.8ML injection   Commonly known as: LOVENOX   Inject 0.2 mLs (30 mg total) into the skin every 12 (twelve) hours.      feeding supplement Pudg   Take 1 Container by mouth 3 (three) times daily between meals.      furosemide 80 MG tablet   Commonly  known as: LASIX   Take 0.5 tablets (40 mg total) by mouth 2 (two) times daily.      insulin aspart 100 UNIT/ML injection   Commonly known as: novoLOG   Inject 0-5 Units into the skin at bedtime.      insulin aspart 100 UNIT/ML injection   Commonly known as: novoLOG   Inject 0-9 Units into the skin 3 (three) times daily with meals.      insulin NPH-insulin regular (70-30) 100 UNIT/ML injection   Commonly known as: NOVOLIN 70/30   Inject 20-50 Units into the skin 2 (two) times daily with a meal. Takes 50 units in the am & 20 units in the pm      ipratropium 0.02 % nebulizer solution   Commonly known as: ATROVENT   Take 2.5 mLs (0.5 mg total) by nebulization every 4 (four) hours as needed.      levofloxacin 750 MG tablet   Commonly known as: LEVAQUIN   Take 1 tablet (750 mg total) by mouth daily.      methylPREDNISolone sodium succinate 125 mg/2 mL injection   Commonly known as: SOLU-MEDROL   Inject 1.28 mLs (80 mg total) into the vein every 12 (twelve) hours.      ondansetron 4 MG tablet   Commonly known as: ZOFRAN   Take 1 tablet (4 mg total) by mouth every 6 (six) hours as needed for nausea.      oseltamivir 75 MG capsule   Commonly  known as: TAMIFLU   Take 1 capsule (75 mg total) by mouth 2 (two) times daily.      polyethylene glycol packet   Commonly known as: MIRALAX / GLYCOLAX   Take 17 g by mouth daily as needed. For constipation      potassium chloride 10 MEQ tablet   Commonly known as: K-DUR,KLOR-CON   Take 20 mEq by mouth 2 (two) times daily.      predniSONE 50 MG tablet   Commonly known as: DELTASONE   Please take for 5 days 50 mg daily starting 04/08/2012      senna-docusate 8.6-50 MG per tablet   Commonly known as: Senokot-S   Take 1 tablet by mouth daily as needed. For constipation      warfarin 4 MG tablet   Commonly known as: COUMADIN   Take 4 mg by mouth every morning.           Follow-up Information    Follow up with [name], MD. In 2 weeks.   Contact information:   [address]           The results of significant diagnostics from this hospitalization (including imaging, microbiology, ancillary and laboratory) are listed below for reference.    Significant Diagnostic Studies: Dg Chest 2 View 03/31/2012  * IMPRESSION: No evidence of acute cardiopulmonary disease.    Dg Chest Port 1 View 04/03/2012  *IMPRESSION: Chronic lung changes.  Difficult to exclude subtle acute disease in the right lung.   O   Microbiology: Recent Results (from the past 240 hour(s))  CULTURE, BLOOD (ROUTINE X 2)     Status: Normal (Preliminary result)   Collection Time   03/31/12 10:20 PM      Component Value Range Status Comment   Specimen Description BLOOD RIGHT ARM   Final    Special Requests BOTTLES DRAWN AEROBIC AND ANAEROBIC   Final    Culture  Setup Time 04/01/2012 01:56   Final    Culture  Final    Value:        BLOOD CULTURE RECEIVED NO GROWTH TO DATE CULTURE WILL BE HELD FOR 5 DAYS BEFORE ISSUING A FINAL NEGATIVE REPORT   Report Status PENDING   Incomplete   CULTURE, BLOOD (ROUTINE X 2)     Status: Normal (Preliminary result)   Collection Time   03/31/12 10:30 PM       Component Value Range Status Comment   Specimen Description BLOOD LEFT ARM   Final    Special Requests BOTTLES DRAWN AEROBIC ONLY NO VOLUME INDICATED   Final    Culture  Setup Time 04/01/2012 01:56   Final    Culture     Final    Value:        BLOOD CULTURE RECEIVED NO GROWTH TO DATE CULTURE WILL BE HELD FOR 5 DAYS BEFORE ISSUING A FINAL NEGATIVE REPORT   Report Status PENDING   Incomplete   URINE CULTURE     Status: Normal   Collection Time   03/31/12 10:42 PM      Component Value Range Status Comment   Specimen Description URINE, CLEAN CATCH   Final    Special Requests NONE   Final    Culture  Setup Time 04/01/2012 04:28   Final    Colony Count >=100,000 COLONIES/ML   Final    Culture ENTEROBACTER CLOACAE   Final    Report Status 04/02/2012 FINAL   Final    Organism ID, Bacteria ENTEROBACTER CLOACAE   Final   MRSA PCR SCREENING     Status: Normal   Collection Time   04/01/12  1:19 AM      Component Value Range Status Comment   MRSA by PCR NEGATIVE  NEGATIVE Final   CULTURE, EXPECTORATED SPUTUM-ASSESSMENT     Status: Normal   Collection Time   04/01/12  8:00 PM      Component Value Range Status Comment   Specimen Description SPUTUM   Final    Special Requests NONE   Final    Sputum evaluation     Final    Value: THIS SPECIMEN IS ACCEPTABLE. RESPIRATORY CULTURE REPORT TO FOLLOW.   Report Status 04/01/2012 FINAL   Final   CULTURE, RESPIRATORY     Status: Normal   Collection Time   04/01/12  8:00 PM      Component Value Range Status Comment   Specimen Description SPUTUM   Final    Special Requests NONE   Final    Gram Stain     Final    Value: MODERATE WBC PRESENT,BOTH PMN AND MONONUCLEAR     RARE SQUAMOUS EPITHELIAL CELLS PRESENT     NO ORGANISMS SEEN   Culture NORMAL OROPHARYNGEAL FLORA   Final    Report Status 04/04/2012 FINAL   Final      Labs: Basic Metabolic Panel:  Lab 04/04/12 1610 04/03/12 0335 04/01/12 0255 03/31/12 2120  NA 134* 133* 135 136  K 3.7 4.1  3.9 4.0  CL 99 100 99 99  CO2 24 25 29 25   GLUCOSE 171* 146* 84 158*  BUN 33* 39* 22 23  CREATININE 1.73* 2.08* 1.47* 1.42*  CALCIUM 8.7 8.2* 9.2 9.6  MG 1.9 -- -- --  PHOS -- -- -- --   Liver Function Tests:  Lab 04/01/12 0255 03/31/12 2120  AST 28 38*  ALT 23 25  ALKPHOS 104 119*  BILITOT 0.6 0.6  PROT 7.7 8.2  ALBUMIN 2.9* 3.2*  Lab 03/31/12 2120  LIPASE 22  AMYLASE --   No results found for this basename: AMMONIA:5 in the last 168 hours CBC:  Lab 04/06/12 0430 04/04/12 0455 04/03/12 0335 04/02/12 0343 04/01/12 0255 03/31/12 2120  WBC 8.4 7.0 6.2 7.1 8.4 --  HGB 10.8* 10.7* 10.5* 10.9* 11.6* --  MCV 91.6 92.7 94.0 94.5 93.2 --  PLT 157 156 142* 154 216 --   Cardiac Enzymes:  Lab 03/31/12 2120  CKTOTAL --  CKMB --  CKMBINDEX --  TROPONINI <0.30   BNP: BNP (last 3 results)  Basename 03/31/12 2120 12/22/11 0600 12/18/11 0955  PROBNP 8718.0* 4724.0* 4560.0*   CBG:  Lab 04/06/12 0729 04/05/12 2131 04/05/12 1639 04/05/12 1153 04/05/12 0748  GLUCAP 422* 485* 487* 414* 401*    Time coordinating discharge: Over 30 minutes  Signed:  Manson Passey, MD  [facility] 04/06/2012, 10:52 AM  Pager #: [phone]

## 2012-04-06 NOTE — Progress Notes (Signed)
CSW received call from Genie @ [facility] stating that they will have a bed available for patient today. Text page sent to Dr. Elisabeth Pigeon to make her aware. CSW also let patient's son-in-law, Timothy Lasso (ph#: [phone]) aware as well. CSW signing off.   Unice Bailey, LCSW [facility] Clinical Social Worker cell #: [phone]

## 2012-04-06 NOTE — Progress Notes (Signed)
Pts CBG 422 - MD made aware, she stated that she will adjust insulin dose during her rounds.

## 2012-04-06 NOTE — Progress Notes (Signed)
Inpatient Diabetes Program Recommendations  AACE/ADA: New Consensus Statement on Inpatient Glycemic Control (2013)  Target Ranges:  Prepandial:   less than 140 mg/dL      Peak postprandial:   less than 180 mg/dL (1-2 hours)      Critically ill patients:  140 - 180 mg/dL   Reason for Visit: Hyperglycemia  Pt to be discharged to Inpatient Rehab at [facility] today.  CBGs in 400s all day yesterday.  Solumedrol tapering up to 80 mg Q 8 hours.  Eating 100% of meals.  On 70/30 50 units in am and 20 units in pm at home.   Results for Vincent [name], Vincent [name] (MRN [id number]) as of 04/06/2012 12:11  Ref. Range 04/04/2012 22:30 04/05/2012 07:48 04/05/2012 11:53 04/05/2012 16:39 04/05/2012 21:31 04/06/2012 07:29  Glucose-Capillary Latest Range: 70-99 mg/dL 409 (H) 811 (H) 914 (H) 487 (H) 485 (H) 422 (H)   Results for Vincent [name], Vincent [name] (MRN [id number]) as of 04/06/2012 12:11  Ref. Range 04/01/2012 02:55  Hemoglobin A1C Latest Range: <5.7 % 9.1 (H)   Uncontrolled diabetes with steroid increase.  Recommendation:  Increase Lantus to 30 units and titrate up until FBS < 180 mg/dL. Increase meal coverage insulin to Novolog 10 units tidwc.  Will follow.  Thank you. Ailene Ards, RD, LDN, CDE Inpatient Diabetes Coordinator [phone]

## 2012-04-06 NOTE — Progress Notes (Signed)
Physical Therapy Treatment Patient Details Name: Vincent [name] MRN: [id number] DOB: [birth year] Today's Date: 04/06/2012 Time: 1478-2956 PT Time Calculation (min): 17 min  PT Assessment / Plan / Recommendation Comments on Treatment Session  Pt. is improved in functional mobility and respiratory. Pt.'s Rx limited by HR increased to day. Recommend CIR when medically ready.    Follow Up Recommendations  [facility]     Does the patient have the potential to tolerate intense rehabilitation     Barriers to Discharge        Equipment Recommendations  None recommended by PT    Recommendations for Other Services Rehab consult  Frequency Min 3X/week   Plan Discharge plan remains appropriate;Frequency remains appropriate    Precautions / Restrictions Precautions Precautions: Fall Precaution Comments: HR increased to 150's, monitor sats.   Pertinent Vitals/Pain sats >96% RA HR up into 150's, RN aware.    Mobility  Bed Mobility Supine to Sit: 5: Supervision;HOB elevated;With rails Transfers Sit to Stand: 4: Min guard;With upper extremity assist;From toilet;From bed Stand to Sit: 4: Min guard;With upper extremity assist;With armrests;To chair/3-in-1;To toilet Details for Transfer Assistance: Min cues for hand placement and safety. Ambulation/Gait Ambulation/Gait Assistance: 4: Min assist Ambulation Distance (Feet): 40 Feet Assistive device: Rolling walker Ambulation/Gait Assistance Details: limited activity due to call about HR into 150's. Gait Pattern: Step-through pattern    Exercises     PT Diagnosis:    PT Problem List:   PT Treatment Interventions:     PT Goals Acute Rehab PT Goals Pt will go Supine/Side to Sit: Independently PT Goal: Supine/Side to Sit - Progress: Updated due to goal met Pt will go Sit to Stand: with supervision PT Goal: Sit to Stand - Progress: Progressing toward goal Pt will go Stand to Sit: with supervision PT Goal: Stand to Sit - Progress: Progressing  toward goal Pt will Ambulate: 51 - 150 feet;with supervision;with rolling walker PT Goal: Ambulate - Progress: Progressing toward goal  Visit Information  Last PT Received On: 04/06/12 Assistance Needed: +1    Subjective Data  Subjective: I am still a little nauseated. I want to walk   Cognition  Overall Cognitive Status: Appears within functional limits for tasks assessed/performed Arousal/Alertness: Awake/alert Orientation Level: Appears intact for tasks assessed Behavior During Session: [facility] for tasks performed    Balance  Static Sitting Balance Static Sitting - Balance Support: No upper extremity supported;Feet supported Static Sitting - Level of Assistance: 6: Modified independent (Device/Increase time) Static Standing Balance Static Standing - Balance Support: No upper extremity supported;During functional activity Static Standing - Level of Assistance: 5: Stand by assistance  End of Session PT - End of Session Activity Tolerance: Treatment limited secondary to medical complications (Comment) Patient left: in chair;with call bell/phone within reach;with chair alarm set Nurse Communication: Mobility status   GP     Rada Hay 04/06/2012, 11:52 [phone]

## 2012-04-06 NOTE — PMR Pre-admission (Signed)
PMR Admission Coordinator Pre-Admission Assessment  Patient: Vincent [name] is an 76 y.o., male MRN: [id number] DOB: [birth year] Height: 5\' 9"  (175.3 cm) Weight: 116.1 kg (255 lb 15.3 oz)              Insurance Information HMO: No    PPO:       PCP:       IPA:       80/20:       OTHER:   PRIMARY: Medicare A/B      Policy#: [id number]      Subscriber: Vincent [name] CM Name:        Phone#:       Fax#:   Pre-Cert#:        Employer: Retired Benefits:  Phone #:       Name: Armed forces technical officer. Date:  04/15/02   Deduct: $1184      Out of Pocket Max: none      Life Max: unlimited CIR: 100%      SNF: 100 days Outpatient: 80%     Co-Pay: 20% Home Health: 100%      Co-Pay: none DME: 80%     Co-Pay: 20% Providers: patient's choice  SECONDARY: Tricare for Life      Policy#: [id number]      Subscriber: Vincent [name] CM Name:        Phone#:       Fax#:   Pre-Cert#:        Employer: Retired Benefits:  Phone #: [phone]     Name:   Eff. Date:       Deduct:        Out of Pocket Max:        Life Max:   CIR:        SNF:   Outpatient:       Co-Pay:   Home Health:        Co-Pay:   DME:       Co-Pay:    Emergency Contact Information Contact Information    Name Relation Home Work Mobile   Daughter,Vincent [name]  [phone]  [phone]   Vincent [name], Vincent [name] [phone]  [phone]     Current Medical History  Patient Admitting Diagnosis: deconditioning after pneumonia, mild encephalopathy   History of Present Illness:  A 76 y.o. right-handed male with history of diabetes mellitus, congestive heart failure, atrial fibrillation as well as left atrial thrombus with chronic Coumadin and chronic renal insufficiency with baseline creatinine 1.42. Admitted 03/31/2012 with nausea, vomiting and new cough. Patient recently discharged from the hospital in September for cardiac stenting as well as bacteremia. Noted low-grade fever 100.3. Chest x-ray with chronic findings questionable pneumonia.. Placed on broad-spectrum  antibiotics for suspect healthcare associated pneumonia. Renal function continues to be monitored with latest creatinine 2.08. He remains on chronic Coumadin therapy with no bleeding episodes. Physical and occupational therapy evaluations are completed. M.D. is requested physical medicine rehabilitation consult to consider inpatient rehabilitation services.   Past Medical History  Past Medical History  Diagnosis Date  . Diabetes mellitus   . Hypertension   . CHF (congestive heart failure)   . Cancer   . Pneumonia   . Irregular heartbeat   . Prostate cancer   . Shortness of breath   . GERD (gastroesophageal reflux disease)   . Acute on chronic diastolic CHF (congestive heart failure) 12/18/2011  . Atrial fibrillation with rapid ventricular response, history of PAF was in SR in 08/2011 12/18/2011  . CKD (chronic kidney disease)  stage 3, GFR 30-59 ml/min 12/26/2011    Family History  family history includes Diabetes Mellitus II in his mother; Prostate cancer in his father; and Sudden death in his mother.  Prior Rehab/Hospitalizations: Had HH therapies 2 weeks ago.  Was in [facility] 09/13 after infection.   Current Medications  Current facility-administered medications:acetaminophen (TYLENOL) suppository 650 mg, 650 mg, Rectal, Q6H PRN, Marinda Elk, MD;  acetaminophen (TYLENOL) tablet 650 mg, 650 mg, Oral, Q6H PRN, Marinda Elk, MD;  albuterol (PROVENTIL) (5 MG/ML) 0.5% nebulizer solution 2.5 mg, 2.5 mg, Nebulization, Q4H, Alison Murray, MD, 2.5 mg at 04/06/12 0850 albuterol (PROVENTIL) (5 MG/ML) 0.5% nebulizer solution 2.5 mg, 2.5 mg, Nebulization, Q2H PRN, Alison Murray, MD, 2.5 mg at 04/04/12 1012;  allopurinol (ZYLOPRIM) tablet 150 mg, 150 mg, Oral, q morning - 10a, Marinda Elk, MD, 150 mg at 04/06/12 3086;  antiseptic oral rinse (BIOTENE) solution 15 mL, 15 mL, Mouth Rinse, BID, Maryruth Bun Rama, MD, 15 mL at 04/06/12 0828 diltiazem (TIAZAC) 24 hr capsule 300 mg,  300 mg, Oral, q morning - 10a, Marinda Elk, MD, 300 mg at 04/06/12 5784;  feeding supplement (ENSURE) pudding 1 Container, 1 Container, Oral, TID BM, Marinda Elk, MD, 1 Container at 04/06/12 1000;  heparin ADULT infusion 100 units/mL (25000 units/250 mL), 1,000 Units/hr, Intravenous, Continuous, Christine E Shade, PHARMD, Last Rate: 10 mL/hr at 04/05/12 2326, 1,000 Units/hr at 04/05/12 2326 insulin aspart (novoLOG) injection 0-5 Units, 0-5 Units, Subcutaneous, QHS, Marinda Elk, MD, 7 Units at 04/05/12 2259;  insulin aspart (novoLOG) injection 0-9 Units, 0-9 Units, Subcutaneous, TID WC, Marinda Elk, MD, 9 Units at 04/06/12 0827;  insulin aspart (novoLOG) injection 5 Units, 5 Units, Subcutaneous, TID WC, Alison Murray, MD, 5 Units at 04/06/12 0828 insulin aspart (novoLOG) injection 7 Units, 7 Units, Subcutaneous, Once, Harvette Velora Heckler, MD;  insulin glargine (LANTUS) injection 19 Units, 19 Units, Subcutaneous, QHS, Alison Murray, MD, 19 Units at 04/05/12 2259;  ipratropium (ATROVENT) nebulizer solution 0.5 mg, 0.5 mg, Nebulization, Q4H, Alison Murray, MD, 0.5 mg at 04/06/12 0850 ipratropium (ATROVENT) nebulizer solution 0.5 mg, 0.5 mg, Nebulization, Q2H PRN, Alison Murray, MD, 0.5 mg at 04/04/12 1012;  levofloxacin (LEVAQUIN) IVPB 750 mg, 750 mg, Intravenous, Q24H, Alison Murray, MD;  methylPREDNISolone sodium succinate (SOLU-MEDROL) 125 mg/2 mL injection 80 mg, 80 mg, Intravenous, Q8H, Alison Murray, MD, 80 mg at 04/06/12 0521 ondansetron [facility]) injection 4 mg, 4 mg, Intravenous, Q6H PRN, Marinda Elk, MD, 4 mg at 04/05/12 2056;  ondansetron [facility]) tablet 4 mg, 4 mg, Oral, Q6H PRN, Marinda Elk, MD, 4 mg at 04/04/12 0118;  oseltamivir (TAMIFLU) capsule 75 mg, 75 mg, Oral, BID, Maryruth Bun Rama, MD, 75 mg at 04/06/12 0929;  polyethylene glycol (MIRALAX / GLYCOLAX) packet 17 g, 17 g, Oral, Daily PRN, Marinda Elk, MD promethazine (PHENERGAN) injection 25  mg, 25 mg, Intravenous, Q6H PRN, Maryruth Bun Rama, MD, 25 mg at 04/01/12 2341;  senna-docusate (Senokot-S) tablet 1 tablet, 1 tablet, Oral, Daily PRN, Marinda Elk, MD, 1 tablet at 04/04/12 2313;  sodium chloride 0.9 % injection 3 mL, 3 mL, Intravenous, Q12H, Marinda Elk, MD, 3 mL at 04/06/12 6962;  warfarin (COUMADIN) tablet 8 mg, 8 mg, Oral, Once, Alison Murray, MD Warfarin - Pharmacist Dosing Inpatient, , Does not apply, q1800, Marinda Elk, MD  Patients Current Diet: Carb Control  Precautions / Restrictions Precautions Precautions: Fall  Restrictions Weight Bearing Restrictions: No   Prior Activity Level Limited Community (1-2x/wk): Went out 2-3 X a week, MD appts.  Up until 09/13 was driving and went out 5 days a week.  Home Assistive Devices / Equipment Home Assistive Devices/Equipment: Cane (specify quad or straight) Home Adaptive Equipment: Tub transfer bench;Walker - rolling  Prior Functional Level Prior Function Level of Independence: Independent with assistive device(s) (occasional assist with adls) Driving: No  Current Functional Level Cognition  Arousal/Alertness: Awake/alert Overall Cognitive Status: Appears within functional limits for tasks assessed/performed Orientation Level: Oriented X4;Other (comment) (pds of confusion)    Extremity Assessment (includes Sensation/Coordination)  RUE ROM/Strength/Tone: Deficits RUE ROM/Strength/Tone Deficits: able to lift to 90 degrees only  RLE ROM/Strength/Tone: Deficits RLE ROM/Strength/Tone Deficits: at least 4 /5 for standing and taking a few steps, acknowledges LT RLE Sensation: History of peripheral neuropathy    ADLs  Eating/Feeding: Simulated;Independent Where Assessed - Eating/Feeding: Chair Grooming: Teeth care;Min guard Where Assessed - Grooming: Supported standing Upper Body Bathing: Simulated;Set up Where Assessed - Upper Body Bathing: Unsupported sitting Lower Body Bathing: Simulated;+2  Total assistance Lower Body Bathing: Patient Percentage: 60% Where Assessed - Lower Body Bathing: Supported sit to stand Upper Body Dressing: Simulated;Minimal assistance Where Assessed - Upper Body Dressing: Unsupported sitting Lower Body Dressing: Moderate assistance Lower Body Dressing: Patient Percentage: 40% Where Assessed - Lower Body Dressing: Unsupported sitting (to don/doff socks.) Toilet Transfer: Minimal assistance Toilet Transfer: Patient Percentage: 70% Toilet Transfer Method: Sit to Barista: Other (comment) (to recliner.) Toileting - Clothing Manipulation and Hygiene: Simulated;+2 Total assistance Toileting - Clothing Manipulation and Hygiene: Patient Percentage: 70% Where Assessed - Toileting Clothing Manipulation and Hygiene: Sit to stand from 3-in-1 or toilet Equipment Used: Rolling walker Transfers/Ambulation Related to ADLs: Pt took several steps into hallway. Tolerated well today. ADL Comments: Pt states he can sometimes can get socks on; could not beyond ankles today    Mobility  Bed Mobility: Supine to Sit Supine to Sit: 3: Mod assist;With rails    Transfers  Sit to Stand: 4: Min assist;From bed;With upper extremity assist Sit to Stand: Patient Percentage: 50%    Ambulation / Gait / Stairs / Wheelchair Mobility  Ambulation/Gait Ambulation/Gait Assistance: 4: Min Environmental consultant (Feet): 60 Feet Assistive device: Rolling walker Ambulation/Gait Assistance Details: slow pace, cues for staying inside RW Gait Pattern: Step-through pattern;Decreased stride length;Trunk flexed Gait velocity: decreased    Posture / Balance Static Sitting Balance Static Sitting - Balance Support: Left upper extremity supported Static Sitting - Level of Assistance: 5: Stand by assistance Static Sitting - Comment/# of Minutes: Pt stood ~2 minutes to brush teeth.    Special needs/care consideration BiPAP/CPAP No CPM NO Continuous Drip IV Heparin  Drip Dialysis No        Days NO Life Vest No Oxygen Yes, in hospital but not at home Special Bed No Trach Size No Wound Vac (area) No      Location No Skin No issues                              Location No Bowel mgmt: Had BM today, 12/23 Bladder mgmt: Voiding in urinal Diabetic mgmt Yes, is a diabetic.     Previous Home Environment Living Arrangements: Children Lives With: Spouse Available Help at Discharge: Family (son in law--not nearby) Type of Home: House Home Layout: One level Home Access: Level entry Bathroom Shower/Tub: Tub/shower unit [employer]  Toilet: Standard Home Care Services: No  Discharge Living Setting Plans for Discharge Living Setting: Patient's home;House;Lives with (comment) (Lives with wife and 3 grandchildren.) Type of Home at Discharge: House Discharge Home Layout: Two level;Able to live on main level with bedroom/bathroom Discharge Home Access: Ramped entrance (Ramp at garage entry, 2 steps front entrance.) Do you have any problems obtaining your medications?: No  Social/Family/Support Systems Patient Roles: Spouse;Parent Contact Information: June Hagood - spouse (h) [phone] (c) [phone]; Madelyn Brunner - Dtr (h) [phone] ([phone] Anticipated Caregiver: Wife and self Ability/Limitations of Caregiver: Wife is a dialysis patient.  Dtr and son-in-law work. Caregiver Availability: Intermittent Discharge Plan Discussed with Primary Caregiver: Yes Is Caregiver In Agreement with Plan?: Yes Does Caregiver/Family have Issues with Lodging/Transportation while Pt is in Rehab?: No    Goals/Additional Needs Patient/Family Goal for Rehab: PT/OT mod I, no ST needs Expected length of stay: 1 week Cultural Considerations: [employer] (Retired Optician, dispensing.) Dietary Needs: Carb Mod Med Cal, thin liquids Equipment Needs: TBD Pt/Family Agrees to Admission and willing to participate: Yes Program Orientation Provided & Reviewed with Pt/Caregiver Including Roles  &  Responsibilities: Yes   Decrease burden of Care through IP rehab admission:  Not applicable  Possible need for SNF placement upon discharge: Not likely   Patient Condition: Please see physician update to information in consult dated 04/03/12.  Preadmission Screen Completed By:  Trish Mage, 04/06/2012 10:28 AM ______________________________________________________________________   Discussed status with Dr. Wynn Banker on 04/06/12 at 1036 and received telephone approval for admission today.  Admission Coordinator:  Trish Mage, time1036/Date12/23/13

## 2012-04-06 NOTE — Progress Notes (Signed)
Discharge to In patient Rehab, p/uby carelink personal. Patient no complaints of any pain or  Discomfort upon discharge. Report given to Almira Coaster, RN requesting to retain PIV.

## 2012-04-06 NOTE — Progress Notes (Signed)
Occupational Therapy Treatment Patient Details Name: DISHAWN BHARGAVA MRN: [id number] DOB: [birth year] Today's Date: 04/06/2012 Time: 1191-4782 OT Time Calculation (min): 20 min  OT Assessment / Plan / Recommendation Comments on Treatment Session D/c planned today for CIR.    Follow Up Recommendations  CIR    Barriers to Discharge       Equipment Recommendations  3 in 1 bedside comode    Recommendations for Other Services    Frequency Min 2X/week   Plan Discharge plan remains appropriate    Precautions / Restrictions Precautions Precautions: Fall   Pertinent Vitals/Pain Pt denied pain. HR did become elevated into 150s with light ADL activity.    ADL  Grooming: Teeth care;Other (comment) (minguard) Lower Body Dressing: Set up Where Assessed - Lower Body Dressing: Supported sit to stand Toilet Transfer: Hydrographic surveyor Method: Sit to Barista: Comfort height toilet Toileting - Architect and Hygiene: Min guard Where Assessed - Toileting Clothing Manipulation and Hygiene: Sit to stand from 3-in-1 or toilet Transfers/Ambulation Related to ADLs: Deferred ambulation today due to elevated HR (150s) following light ADL activity. ADL Comments: Pt was able to don sweatpants without any physical A from therapist. Pt was also able to complete toilet hygeine thoroughly without physical A.     OT Diagnosis:    OT Problem List:   OT Treatment Interventions:     OT Goals ADL Goals ADL Goal: Grooming - Progress: Progressing toward goals Pt Will Perform Lower Body Dressing: with supervision;Sit to stand from bed;Sit to stand from chair ADL Goal: Lower Body Dressing - Progress: Updated due to goal met ADL Goal: Toilet Transfer - Progress: Progressing toward goals Pt Will Perform Toileting - Hygiene: with supervision ADL Goal: Toileting - Hygiene - Progress: Updated due to goal met  Visit Information  Last OT Received On: 04/06/12 Assistance  Needed: +1 PT/OT Co-Evaluation/Treatment: Yes    Subjective Data  Subjective: I'm hoping I get to go to rehab...   Prior Functioning       Cognition  Overall Cognitive Status: Appears within functional limits for tasks assessed/performed Arousal/Alertness: Awake/alert Orientation Level: Appears intact for tasks assessed Behavior During Session: [facility] for tasks performed    Mobility  Shoulder Instructions Bed Mobility Supine to Sit: 5: Supervision;HOB elevated;With rails Transfers Transfers: Stand to Sit Sit to Stand: 4: Min guard;With upper extremity assist;From toilet;From bed Stand to Sit: 4: Min guard;With upper extremity assist;With armrests;To chair/3-in-1;To toilet Details for Transfer Assistance: Min cues for hand placement and safety.       Exercises      Balance Static Sitting Balance Static Sitting - Balance Support: No upper extremity supported;Feet supported Static Sitting - Level of Assistance: 6: Modified independent (Device/Increase time) Static Standing Balance Static Standing - Balance Support: No upper extremity supported;During functional activity Static Standing - Level of Assistance: 5: Stand by assistance   End of Session OT - End of Session Activity Tolerance: Treatment limited secondary to medical complications (Comment) (elevated HR.) Patient left: in chair;with call bell/phone within reach;with chair alarm set  GO     Zalmen Wrightsman A OTR/L [phone] 04/06/2012, 11:35 AM

## 2012-04-06 NOTE — Progress Notes (Addendum)
ANTICOAGULATION CONSULT NOTE - Follow Up Consult  Pharmacy Consult for Coumadin/IV Heparin Indication: Afib, Hx L Atrial thrombus  No Known Allergies  Patient Measurements: Height: 5\' 9"  (175.3 cm) Weight: 255 lb 15.3 oz (116.1 kg) IBW/kg (Calculated) : 70.7   Vital Signs: Temp: 97.4 F (36.3 C) (12/23 0511) Temp src: Oral (12/23 0511) BP: 129/90 mmHg (12/23 0511) Pulse Rate: 80  (12/23 0511)  Labs:  Basename 04/06/12 0430 04/05/12 0452 04/04/12 0455  HGB 10.8* -- 10.7*  HCT 33.6* -- 33.1*  PLT 157 -- 156  APTT -- -- --  LABPROT 20.6* 19.8* 18.8*  INR 1.84* 1.75* 1.63*  HEPARINUNFRC 0.41 0.37 0.40  CREATININE -- -- 1.73*  CKTOTAL -- -- --  CKMB -- -- --  TROPONINI -- -- --   Estimated Creatinine Clearance: 47.1 ml/min (by C-G formula based on Cr of 1.73).  Medications:  Warfarin PTA 4mg  po qam.   Assessment: 76yo M on Coumadin PTA for hx L atrial thrombus, hx afib. Admit for N/V, rule out PNA (treated for HCAP). INR subtherapeutic on admit, Heparin begun for bridging to therapeutic INR.  Heparin level in therapeutic range (0.41), INR 1.84, CBC wnl, stable.  Coumadin doses (12/18-12/21) 6mg ,5mg ,5mg ,7.5mg , 8mg .  Received Levaquin x 3 days 12/17-12/19; no apparent effect on INR, level increasing slowly.  Tolerating carb mod diet.  Goal of Therapy:  INR 2-3 Heparin level 0.3-0.7 units/ml Monitor platelets by anticoagulation protocol: Yes   Plan:  1.  Continue IV heparin at current rate of 1000 units/hr. 2.  Repeat Coumadin 8mg  today at noon.  3.  F/u daily heparin level, PT/INR, CBC, clinical course.    Charolotte Eke, PharmD, pager [phone]. 04/06/2012,7:09 AM.  Addendum:   A: Switching from Heparin to Lovenox until INR is in therapeutic range of 2-3. Weight 116kg and SCr 1.73 on 12/21(improving), CrCl 47CG, 38N.  P: Stop heparin drip and start Lovenox 115mg  SQ q12h. F/u daily.  Charolotte Eke, PharmD, pager [phone]. 04/06/2012,11:13 AM.

## 2012-04-06 NOTE — Consult Note (Signed)
Rehab admission - Evaluated for possible admission.  I spoke to patient's daughter, Annabelle Harman.  Patient and family are interested in inpatient rehab here at [facility].  Bed available and can admit to acute inpatient rehab today.  Call me for questions.  #[phone]

## 2012-04-06 NOTE — H&P (Signed)
Physical Medicine and Rehabilitation Admission H&P      Chief Complaint   Patient presents with   .  Abdominal Pain   .  Emesis   .  Diarrhea   .  Altered Mental Status    : ZOX:WRUE Vincent [name] is Vincent 76 y.o. right-handed male with history of diabetes mellitus, congestive heart failure, atrial fibrillation as well as left atrial thrombus with chronic Coumadin and chronic renal insufficiency with baseline creatinine 1. 53. Patient with recent admission 9/ 2013 for coagulase-negative staph bacteremia Vincent TEE negative. Admitted 03/31/2012 with nausea, vomiting and new cough. Patient recently discharged from the hospital in September for cardiac stenting as well as bacteremia. Noted low-grade fever 100.3. Chest x-ray with chronic findings questionable pneumonia.. Placed on broad-spectrum antibiotics for suspect healthcare associated pneumonia and presently maintained on Levaquin as well as Tamiflu. Blood cultures and sputum cultures negative. He remains on intravenous Solu-Medrol plan to taper as per medicine team. Patient continues with droplet precautions. Renal function continues to be monitored with latest creatinine 1.73. He remains on chronic Coumadin therapy with no bleeding episodes and latest INR of 1.84 as well as intravenous heparin until INR greater than 2.00. Physical and occupational therapy evaluations are completed. Bouts of confusion felt to be mild encephalopathy he continues to improve. M.D. is requested physical medicine rehabilitation consult to consider inpatient rehabilitation services. Patient was felt to be Vincent good candidate for inpatient rehabilitation services was admitted for comprehensive rehabilitation program    ROS:   Cough, sob, mild confusion, weakness, swelling   Vincent 12 point review of systems has been performed and if not noted above is otherwise negative      Past Medical History   Diagnosis  Date   .  Diabetes mellitus     .  Hypertension     .  CHF (congestive  heart failure)     .  Cancer     .  Pneumonia     .  Irregular heartbeat     .  Prostate cancer     .  Shortness of breath     .  GERD (gastroesophageal reflux disease)     .  Acute on chronic diastolic CHF (congestive heart failure)  12/18/2011   .  Atrial fibrillation with rapid ventricular response, history of PAF was in SR in 08/2011  12/18/2011   .  CKD (chronic kidney disease) stage 3, GFR 30-59 ml/min  12/26/2011    Past Surgical History   Procedure  Date   .  Prostate surgery     .  Tee without cardioversion  12/27/2011       Procedure: TRANSESOPHAGEAL ECHOCARDIOGRAM (TEE);  Surgeon: Chrystie Nose, MD;  Location: [facility] OR;  Service: Cardiovascular;  Laterality: N/Vincent;  MD request Main OR   .  Cardioversion  12/27/2011       Procedure: CARDIOVERSION;  Surgeon: Chrystie Nose, MD;  Location: [facility] OR;  Service: Cardiovascular;  Laterality: N/Vincent;    Family History   Problem  Relation  Age of Onset   .  Diabetes Mellitus II  Mother     .  Sudden death  Mother     .  Prostate cancer  Father      Social History: reports that he has never smoked. He has never used smokeless tobacco. He reports that he does not drink alcohol or use illicit drugs. Allergies: No Known Allergies Medications Prior to Admission   Medication  Sig  Dispense  Refill   .  allopurinol (ZYLOPRIM) 100 MG tablet  Take 150 mg by mouth every morning.          Marland Kitchen  atenolol (TENORMIN) 100 MG tablet  Take 100 mg by mouth 2 (two) times daily.         Marland Kitchen  diltiazem (TIAZAC) 300 MG 24 hr capsule  Take 300 mg by mouth every morning.          .  furosemide (LASIX) 80 MG tablet  Take 0.5 tablets (40 mg total) by mouth 2 (two) times daily.   30 tablet   0   .  insulin NPH-insulin regular (NOVOLIN 70/30) (70-30) 100 UNIT/ML injection  Inject 20-50 Units into the skin 2 (two) times daily with Vincent meal. Takes 50 units in the am & 20 units in the pm         .  polyethylene glycol (MIRALAX / GLYCOLAX) packet  Take 17 g by mouth daily as needed.  For constipation         .  potassium chloride (K-DUR,KLOR-CON) 10 MEQ tablet  Take 20 mEq by mouth 2 (two) times daily.          Marland Kitchen  senna-docusate (SENOKOT-S) 8.6-50 MG per tablet  Take 1 tablet by mouth daily as needed. For constipation         .  warfarin (COUMADIN) 4 MG tablet  Take 4 mg by mouth every morning.             Home: Home Living Lives With: Spouse Available Help at Discharge: Family (son in law--not nearby) Type of Home: House Home Access: Level entry Home Layout: One level Bathroom Shower/Tub: Engineer, manufacturing systems: Standard Home Adaptive Equipment: Tub transfer bench;Walker - rolling    Functional History: Prior Function Driving: No   Functional Status:   Mobility: Bed Mobility Bed Mobility: Supine to Sit Supine to Sit: 3: Mod assist;With rails Transfers Sit to Stand: 4: Min assist;From bed;With upper extremity assist Sit to Stand: Patient Percentage: 50% Ambulation/Gait Ambulation/Gait Assistance: 4: Min assist Ambulation Distance (Feet): 60 Feet Assistive device: Rolling walker Ambulation/Gait Assistance Details: slow pace, cues for staying inside RW Gait Pattern: Step-through pattern;Decreased stride length;Trunk flexed Gait velocity: decreased   ADL: ADL Eating/Feeding: Simulated;Independent Where Assessed - Eating/Feeding: Chair Grooming: Teeth care;Min guard Where Assessed - Grooming: Supported standing Upper Body Bathing: Simulated;Set up Where Assessed - Upper Body Bathing: Unsupported sitting Lower Body Bathing: Simulated;+2 Total assistance Where Assessed - Lower Body Bathing: Supported sit to stand Upper Body Dressing: Simulated;Minimal assistance Where Assessed - Upper Body Dressing: Unsupported sitting Lower Body Dressing: Moderate assistance Where Assessed - Lower Body Dressing: Unsupported sitting (to don/doff socks.) Toilet Transfer: Minimal assistance Toilet Transfer Method: Sit to stand Toilet Transfer Equipment:  Other (comment) (to recliner.) Equipment Used: Rolling walker Transfers/Ambulation Related to ADLs: Pt took several steps into hallway. Tolerated well today. ADL Comments: Pt states he can sometimes can get socks on; could not beyond ankles today   Cognition: Cognition Arousal/Alertness: Awake/alert Orientation Level: Oriented X4;Other (comment) (pds of confusion) Cognition Overall Cognitive Status: Appears within functional limits for tasks assessed/performed Arousal/Alertness: Awake/alert Orientation Level: Appears intact for tasks assessed Behavior During Session: [facility] for tasks performed     Blood pressure 129/90, pulse 80, temperature 97.4 F (36.3 C), temperature source Oral, resp. rate 20, height 5\' 9"  (1.753 m), weight 116.1 kg (255 lb 15.3 oz), SpO2 97.00%. PHYSICAL EXAM:   General: Alert and oriented  x 2-3, No apparent distress. Large man   HEENT: Head is normocephalic, atraumatic, PERRLA, EOMI, sclera anicteric, oral mucosa pink and moist, dentition intact, ext ear canals clear,   Neck: Supple without JVD or lymphadenopathy   Heart: Reg rate and rhythm. No murmurs rubs or gallops   Chest: no wheezes, Vincent few rales, no rhonchi; no distress   Abdomen: Soft, non-tender, non-distended, bowel sounds positive.   Extremities: No clubbing, cyanosis; 1+ lower ext edema. Pulses are 2+   Skin: Clean and intact without signs of breakdown   Neuro: Pt is cognitively appropriate with fair insight.. Cranial nerves 2-12 are intact. Sensory exam is normal. Reflexes are 2+ in all 4's. Fine motor coordination is intact.Motor function is grossly 4/5 UE. LE 4- proximally, 4/5 distally. Mild sensory loss distally in stocking glove distribution over both legs.   Musculoskeletal: . Posture appropriate   Psych: Pt's affect is appropriate. Pt is cooperative      Results for orders placed during the hospital encounter of 03/31/12 (from the past 48 hour(s))   GLUCOSE, CAPILLARY     Status: Abnormal      Collection Time     04/04/12 12:02 PM       Component  Value  Range  Comment     Glucose-Capillary  214 (*)  70 - 99 mg/dL     GLUCOSE, CAPILLARY     Status: Abnormal     Collection Time     04/04/12  4:43 PM       Component  Value  Range  Comment     Glucose-Capillary  348 (*)  70 - 99 mg/dL     GLUCOSE, CAPILLARY     Status: Abnormal     Collection Time     04/04/12 10:30 PM       Component  Value  Range  Comment     Glucose-Capillary  356 (*)  70 - 99 mg/dL       Comment 1  Documented in Chart          Comment 2  Notify RN        PROTIME-INR     Status: Abnormal     Collection Time     04/05/12  4:52 AM       Component  Value  Range  Comment     Prothrombin Time  19.8 (*)  11.6 - 15.2 seconds       INR  1.75 (*)  0.00 - 1.49     HEPARIN LEVEL (UNFRACTIONATED)     Status: Normal     Collection Time     04/05/12  4:52 AM       Component  Value  Range  Comment     Heparin Unfractionated  0.37   0.30 - 0.70 IU/mL     GLUCOSE, CAPILLARY     Status: Abnormal     Collection Time     04/05/12  7:48 AM       Component  Value  Range  Comment     Glucose-Capillary  401 (*)  70 - 99 mg/dL     GLUCOSE, CAPILLARY     Status: Abnormal     Collection Time     04/05/12 11:53 AM       Component  Value  Range  Comment     Glucose-Capillary  414 (*)  70 - 99 mg/dL     GLUCOSE, CAPILLARY     Status: Abnormal  Collection Time     04/05/12  4:39 PM       Component  Value  Range  Comment     Glucose-Capillary  487 (*)  70 - 99 mg/dL     GLUCOSE, CAPILLARY     Status: Abnormal     Collection Time     04/05/12  9:31 PM       Component  Value  Range  Comment     Glucose-Capillary  485 (*)  70 - 99 mg/dL       Comment 1  Documented in Chart          Comment 2  Notify RN        PROTIME-INR     Status: Abnormal     Collection Time     04/06/12  4:30 AM       Component  Value  Range  Comment     Prothrombin Time  20.6 (*)  11.6 - 15.2 seconds       INR  1.84 (*)  0.00 - 1.49      HEPARIN LEVEL (UNFRACTIONATED)     Status: Normal     Collection Time     04/06/12  4:30 AM       Component  Value  Range  Comment     Heparin Unfractionated  0.41   0.30 - 0.70 IU/mL     CBC     Status: Abnormal     Collection Time     04/06/12  4:30 AM       Component  Value  Range  Comment     WBC  8.4   4.0 - 10.5 K/uL       RBC  3.67 (*)  4.22 - 5.81 MIL/uL       Hemoglobin  10.8 (*)  13.0 - 17.0 g/dL       HCT  86.5 (*)  78.4 - 52.0 %       MCV  91.6   78.0 - 100.0 fL       MCH  29.4   26.0 - 34.0 pg       MCHC  32.1   30.0 - 36.0 g/dL       RDW  69.6 (*)  29.5 - 15.5 %       Platelets  157   150 - 400 K/uL     GLUCOSE, CAPILLARY     Status: Abnormal     Collection Time     04/06/12  7:29 AM       Component  Value  Range  Comment     Glucose-Capillary  422 (*)  70 - 99 mg/dL      No results found.   Post Admission Physician Evaluation: Functional deficits secondary  to deconditioning after pneumonia. Patient is admitted to receive collaborative, interdisciplinary care between the physiatrist, rehab nursing staff, and therapy team. Patient's level of medical complexity and substantial therapy needs in context of that medical necessity cannot be provided at Vincent lesser intensity of care such as Vincent SNF. Patient has experienced substantial functional loss from his/her baseline which was documented above under the "Functional History" and "Functional Status" headings.  Judging by the patient's diagnosis, physical exam, and functional history, the patient has potential for functional progress which will result in measurable gains while on inpatient rehab.  These gains will be of substantial and practical use upon discharge  in facilitating mobility and self-care at the household level. Physiatrist  will provide 24 hour management of medical needs as well as oversight of the therapy plan/treatment and provide guidance as appropriate regarding the interaction of the two. 24 hour rehab  nursing will assist with bladder management, bowel management, safety, skin/wound care, disease management, medication administration and patient education  and help integrate therapy concepts, techniques,education, etc. PT will assess and treat for:  Pre-gait training, gait training, strengthening, range of motion, endurance, safety, equipment.  Goals are: Modified independent level mobility. OT will assess and treat for: ADLs, upper extremity strengthening upper extremity range of motion, endurance, safety, equipment.   Goals are: Modified independent level ADL. SLP will assess and treat for: Not applicable.  Goals are: Not applicable. Case Management and Social Worker will assess and treat for psychological issues and discharge planning. Team conference will be held weekly to assess progress toward goals and to determine barriers to discharge. Patient will receive at least 3 hours of therapy per day at least 5 days per week. ELOS: 57 days      Prognosis:  excellent     Medical Problem List and Plan: 1. Deconditioning after pneumonia/mild encephalopathy 2. DVT Prophylaxis/Anticoagulation: Chronic Coumadin therapy for left atrial thrombus. Continue intravenous heparin until INR greater than 2.00. Monitor for any bleeding episodes 3. Neuropsych: This patient Is capable of making decisions on his/her own behalf. 4. Healthcare associated pneumonia. Presently patient continues on Levaquin as well as Tamiflu. Taper Solu-Medrol as directed and confirmed duration of antibiotics. Followup chest x-ray and check oxygen saturations every shift. Plan is for Solu-Medrol times one more day and then prednisone 50 mg by mouth daily x5 days stop 5. Diabetes mellitus. Hemoglobin A1c of 9.1. Monitor closely while on Solu-Medrol. Continue Lantus insulin 19 units at bedtime as well as NovoLog 5 units 3 times Vincent day. Check blood sugars Vincent.c. and at bedtime 6. Hypertension/atrial fibrillation. Cardizem 300 mg daily.  Tenormin 100 mg twice Vincent day resumed today. Monitor with increased activity 7. Congestive heart failure. Monitor for any signs of fluid overload. Patient on no present diuretics. Patient was on Lasix 40 mg twice Vincent day prior to admission and resumed today 8. Chronic renal insufficiency with baseline creatinine 1. 53. Latest creatinine 1.73 04/06/2012

## 2012-04-07 ENCOUNTER — Inpatient Hospital Stay (HOSPITAL_COMMUNITY): Payer: Medicare Other

## 2012-04-07 ENCOUNTER — Encounter (HOSPITAL_COMMUNITY): Payer: Self-pay | Admitting: *Deleted

## 2012-04-07 ENCOUNTER — Inpatient Hospital Stay (HOSPITAL_COMMUNITY): Payer: Medicare Other | Admitting: *Deleted

## 2012-04-07 DIAGNOSIS — J189 Pneumonia, unspecified organism: Secondary | ICD-10-CM

## 2012-04-07 DIAGNOSIS — R5381 Other malaise: Secondary | ICD-10-CM

## 2012-04-07 LAB — CBC WITH DIFFERENTIAL/PLATELET
Basophils Relative: 0 % (ref 0–1)
Eosinophils Relative: 0 % (ref 0–5)
Hemoglobin: 11 g/dL — ABNORMAL LOW (ref 13.0–17.0)
Lymphocytes Relative: 7 % — ABNORMAL LOW (ref 12–46)
MCH: 29.7 pg (ref 26.0–34.0)
Monocytes Relative: 3 % (ref 3–12)
Neutrophils Relative %: 90 % — ABNORMAL HIGH (ref 43–77)
RBC: 3.7 MIL/uL — ABNORMAL LOW (ref 4.22–5.81)
WBC: 11.4 10*3/uL — ABNORMAL HIGH (ref 4.0–10.5)

## 2012-04-07 LAB — CULTURE, BLOOD (ROUTINE X 2)
Culture: NO GROWTH
Culture: NO GROWTH

## 2012-04-07 LAB — COMPREHENSIVE METABOLIC PANEL
ALT: 47 U/L (ref 0–53)
AST: 38 U/L — ABNORMAL HIGH (ref 0–37)
Alkaline Phosphatase: 132 U/L — ABNORMAL HIGH (ref 39–117)
CO2: 23 mEq/L (ref 19–32)
Chloride: 98 mEq/L (ref 96–112)
GFR calc Af Amer: 59 mL/min — ABNORMAL LOW (ref >90)
GFR calc non Af Amer: 51 mL/min — ABNORMAL LOW (ref >90)
Glucose, Bld: 429 mg/dL — ABNORMAL HIGH (ref 70–99)
Potassium: 4.4 mEq/L (ref 3.5–5.1)
Sodium: 134 mEq/L — ABNORMAL LOW (ref 135–145)

## 2012-04-07 LAB — GLUCOSE, CAPILLARY
Glucose-Capillary: 329 mg/dL — ABNORMAL HIGH (ref 70–99)
Glucose-Capillary: 244 mg/dL — ABNORMAL HIGH (ref 70–99)

## 2012-04-07 LAB — PROTIME-INR
INR: 2.84 — ABNORMAL HIGH (ref 0.00–1.49)
Prothrombin Time: 28.4 seconds — ABNORMAL HIGH (ref 11.6–15.2)

## 2012-04-07 MED ORDER — OSELTAMIVIR PHOSPHATE 75 MG PO CAPS
75.0000 mg | ORAL_CAPSULE | Freq: Two times a day (BID) | ORAL | Status: AC
  Administered 2012-04-07: 75 mg via ORAL
  Filled 2012-04-07: qty 1

## 2012-04-07 MED ORDER — LEVOFLOXACIN 750 MG PO TABS
750.0000 mg | ORAL_TABLET | Freq: Every day | ORAL | Status: AC
  Administered 2012-04-07 – 2012-04-10 (×4): 750 mg via ORAL
  Filled 2012-04-07 (×4): qty 1

## 2012-04-07 MED ORDER — INSULIN GLARGINE 100 UNIT/ML ~~LOC~~ SOLN
25.0000 [IU] | Freq: Every day | SUBCUTANEOUS | Status: DC
  Administered 2012-04-07 – 2012-04-08 (×2): 25 [IU] via SUBCUTANEOUS

## 2012-04-07 MED ORDER — WARFARIN SODIUM 4 MG PO TABS
4.0000 mg | ORAL_TABLET | Freq: Once | ORAL | Status: AC
  Administered 2012-04-07: 4 mg via ORAL
  Filled 2012-04-07: qty 1

## 2012-04-07 MED ORDER — IPRATROPIUM BROMIDE 0.02 % IN SOLN
0.5000 mg | Freq: Two times a day (BID) | RESPIRATORY_TRACT | Status: DC
  Administered 2012-04-07 – 2012-04-11 (×8): 0.5 mg via RESPIRATORY_TRACT
  Filled 2012-04-07 (×7): qty 2.5

## 2012-04-07 MED ORDER — PREDNISONE 50 MG PO TABS
50.0000 mg | ORAL_TABLET | Freq: Every day | ORAL | Status: DC
  Administered 2012-04-07: 50 mg via ORAL
  Filled 2012-04-07 (×3): qty 1

## 2012-04-07 MED ORDER — PREDNISONE 20 MG PO TABS
40.0000 mg | ORAL_TABLET | Freq: Every day | ORAL | Status: AC
  Administered 2012-04-08 – 2012-04-11 (×4): 40 mg via ORAL
  Filled 2012-04-07 (×4): qty 2

## 2012-04-07 MED ORDER — ALBUTEROL SULFATE (5 MG/ML) 0.5% IN NEBU
2.5000 mg | INHALATION_SOLUTION | Freq: Two times a day (BID) | RESPIRATORY_TRACT | Status: DC
  Administered 2012-04-07 – 2012-04-11 (×8): 2.5 mg via RESPIRATORY_TRACT
  Filled 2012-04-07 (×7): qty 0.5

## 2012-04-07 NOTE — Progress Notes (Signed)
Occupational Therapy Session Note  Patient Details  Name: Vincent [name] MRN: [id number] Date of Birth: May 04, 1935  Today's Date: 04/07/2012 Time: 4401-0272 Time Calculation (min): 60 min  Short Term Goals: Week 1:  OT Short Term Goal 1 (Week 1): STGs = LTGs  Skilled Therapeutic Interventions/Progress Updates: Pt reporting no pain at this time. Pt in wheelchair upon arrival. Pt used RW to use bathroom with SBA. Pt able to complete clothing management and hygiene with SBA without cueing. Pt stood at sink to wash hands x .  In sitting pt completed the following bil UE exercises to increase activity tolerance and strength: With yellow theraband, sh flexion/extension (standing) and row exercise x 12, 3 sets. With 5# weight, sh flexion, bicep curls (standing) x 12, 3 sets. With red weighted ball, bil sh flexion over head,  bil sh ABD, chest press x 12, 3 sets. Pt is motivated for therapy in order to get back home.    Therapy Documentation Precautions:  Precautions Precautions: Fall Precaution Comments: Droplet Precautions Restrictions Weight Bearing Restrictions: No General:   Vital Signs: Therapy Vitals Pulse Rate: 84  Oxygen Therapy SpO2: 96 % O2 Device: None (Room air) Pulse Oximetry Type: Intermittent Pain: Pain Assessment Pain Assessment: No/denies pain Pain Score: 0-No pain   See FIM for current functional status  Therapy/Group: Individual Therapy  Mikeisha Lemonds Hessie Diener 04/07/2012, 10:49 AM

## 2012-04-07 NOTE — Progress Notes (Signed)
Inpatient Diabetes Program Recommendations  AACE/ADA: New Consensus Statement on Inpatient Glycemic Control (2013)  Target Ranges:  Prepandial:   less than 140 mg/dL      Peak postprandial:   less than 180 mg/dL (1-2 hours)      Critically ill patients:  140 - 180 mg/dL   Reason for Visit: Referral for insulin instruction (?) Pt takes insulin at home. If there is need for further instruction, please order insulin starter kit and have bedside RN to review/teach. At this point, glucose levels are extremely high.  Recommend resistant correction q 4 hrs in addition to basal lantus and meal coverage until glucose control is obtained.  Otherwise, an insulin drip is in order.   Note: Thank you, Lenor Coffin, RN, CNS, Diabetes Coordinator [phone])

## 2012-04-07 NOTE — Progress Notes (Signed)
Patient information reviewed and entered into eRehab system by Ashlyn Cabler, RN, CRRN, PPS Coordinator.  Information including medical coding and functional independence measure will be reviewed and updated through discharge.     Per nursing patient was given "Data Collection Information Summary for Patients in Inpatient Rehabilitation Facilities with attached "Privacy Act Statement-Health Care Records" upon admission.  

## 2012-04-07 NOTE — Progress Notes (Signed)
Social Work Patient ID: Vincent [name], male   DOB: [birth year], 76 y.o.   MRN: [id number]  Met briefly with patient today following team conference.  Pt aware and agreeable with targeted d/c for 12/28 at mod i goals.  Aware Dossie Der, LCSW to follow up with him upon her return on Thursday to make any d/c referrals needed.  Pt without any concerns at this point.  Yona Kosek, LCSW

## 2012-04-07 NOTE — Evaluation (Signed)
Occupational Therapy Assessment and Plan  Patient Details  Name: Vincent [name] MRN: [id number] Date of Birth: Mar 26, 1936  OT Diagnosis: muscle weakness (generalized) Rehab Potential: Rehab Potential: Excellent ELOS: ~ 7 days   Today's Date: 04/07/2012 Time: 0730-0830 Time Calculation (min): 60 min  Problem List:  Patient Active Problem List  Diagnosis  . Hypertension  . Irregular heartbeat  . Prostate cancer  . Acute on chronic diastolic CHF (congestive heart failure)  . Atrial fibrillation with rapid ventricular response, history of PAF was in SR in 08/2011  . DM2 (diabetes mellitus, type 2)  . Leukocytosis  . Bacteremia  . CKD (chronic kidney disease) stage 3, GFR 30-59 ml/min  . LA thrombus  . Nausea and vomiting  . Healthcare-associated pneumonia  . Multifocal atrial tachycardia  . Chronic systolic heart failure  . UTI (lower urinary tract infection)  . Normocytic anemia  . PNA (pneumonia)    Past Medical History:  Past Medical History  Diagnosis Date  . Diabetes mellitus   . Hypertension   . CHF (congestive heart failure)   . Cancer   . Pneumonia   . Irregular heartbeat   . Prostate cancer   . Shortness of breath   . GERD (gastroesophageal reflux disease)   . Acute on chronic diastolic CHF (congestive heart failure) 12/18/2011  . Atrial fibrillation with rapid ventricular response, history of PAF was in SR in 08/2011 12/18/2011  . CKD (chronic kidney disease) stage 3, GFR 30-59 ml/min 12/26/2011   Past Surgical History:  Past Surgical History  Procedure Date  . Prostate surgery   . Tee without cardioversion 12/27/2011    Procedure: TRANSESOPHAGEAL ECHOCARDIOGRAM (TEE);  Surgeon: Chrystie Nose, MD;  Location: [facility] OR;  Service: Cardiovascular;  Laterality: N/A;  MD request Main OR  . Cardioversion 12/27/2011    Procedure: CARDIOVERSION;  Surgeon: Chrystie Nose, MD;  Location: [facility] OR;  Service: Cardiovascular;  Laterality: N/A;    Assessment & Plan Clinical  Impression: Patient is a 76 y.o. year old male with recent admission to the hospital with history of diabetes mellitus, congestive heart failure, atrial fibrillation as well as left atrial thrombus with chronic Coumadin and chronic renal insufficiency with baseline creatinine 1. 53. Patient with recent admission 9/ 2013 for coagulase-negative staph bacteremia a TEE negative. Admitted 03/31/2012 with nausea, vomiting and new cough. Patient recently discharged from the hospital in September for cardiac stenting as well as bacteremia. Noted low-grade fever 100.3. Chest x-ray with chronic findings questionable pneumonia.. Placed on broad-spectrum antibiotics for suspect healthcare associated pneumonia and presently maintained on Levaquin as well as Tamiflu. Blood cultures and sputum cultures negative. He remains on intravenous Solu-Medrol plan to taper as per medicine team. Patient continues with droplet precautions. Renal function continues to be monitored with latest creatinine 1.73. He remains on chronic Coumadin therapy with no bleeding episodes and latest INR of 1.84 as well as intravenous heparin until INR greater than 2.00.  Patient transferred to CIR on 04/06/2012 .    Patient currently requires Supervision to [facility] Assist with basic self-care skills secondary to muscle weakness.  Prior to hospitalization, patient could complete BADL with Modified Indepedence.  Patient will benefit from skilled intervention to increase independence with basic self-care skills prior to discharge home with care partner.  Anticipate patient will require no supervision and no further OT follow recommended.  OT - End of Session Endurance Deficit: Yes OT Assessment Rehab Potential: Excellent Barriers to Discharge: Decreased caregiver support Barriers to Discharge  Comments: Wife is unable to provide any physical assistance. OT Plan OT Intensity: Minimum of 1-2 x/day, 45 to 90 minutes OT Frequency: 5 out of 7 days OT  Duration/Estimated Length of Stay: ~ 7 days OT Treatment/Interventions: Balance/vestibular training;Patient/family education;DME/adaptive equipment instruction;Functional mobility training;Community reintegration;Discharge planning;Therapeutic Activities;Self Care/advanced ADL retraining;UE/LE Strength taining/ROM;Therapeutic Exercise OT Recommendation Patient destination: Home Follow Up Recommendations: None Equipment Recommended:  (TBD)  OT Evaluation Precautions/Restrictions  Precautions Precautions: Fall Precaution Comments: Droplet precautions Pain Pain Assessment Pain Assessment: No/denies pain Home Living/Prior Functioning Home Living Lives With: Spouse (Wife is w/c bound and an amputee) Available Help at Discharge: Family (Son in Social worker - does not live nearby) Type of Home: House Home Access: Level entry Home Layout: Two level;Able to live on main level with bedroom/bathroom Alternate Level Stairs-Number of Steps: Pt does not ever go upstairs. Bathroom Shower/Tub: Tub/shower unit;Walk-in shower (Pt uses walk-in shower) Bathroom Toilet: Handicapped height Bathroom Accessibility: Yes How Accessible: Accessible via walker Home Adaptive Equipment: Walker - rolling;Tub transfer bench Prior Function Level of Independence: Independent with basic ADLs;Independent with transfers Able to Take Stairs?: No Driving: No Vocation: Retired Optometrist - History Baseline Vision:  (Pt needs glasses. Unable to read fine print) Patient Visual Report: No change from baseline  Cognition Overall Cognitive Status: Appears within functional limits for tasks assessed Arousal/Alertness: Awake/alert Orientation Level: Oriented X4 Sensation Sensation Light Touch: Appears Intact Stereognosis: Appears Intact Hot/Cold: Appears Intact Proprioception: Appears Intact Coordination Gross Motor Movements are Fluid and Coordinated: Yes Fine Motor Movements are Fluid and Coordinated:  Yes Motor  Motor Motor: Within Functional Limits Mobility  Bed Mobility Supine to Sit: 5: Supervision;HOB flat Transfers Sit to Stand: 5: Supervision Stand to Sit: 5: Supervision   Balance Balance Balance Assessed: Yes Dynamic Standing Balance Dynamic Standing - Balance Support: During functional activity Dynamic Standing - Level of Assistance: 5: Stand by assistance Dynamic Standing - Balance Activities: Forward lean/weight shifting Dynamic Standing - Comments: 1 LOB during shower to right. Patient able to self correct with grab bar. Extremity/Trunk Assessment RUE Assessment RUE Assessment: Within Functional Limits (MMT: 4/5 A/ROM WFL in all ranges) LUE Assessment LUE Assessment: Within Functional Limits (MMT: 4/5 A/ROM WFL in all ranges.)  See FIM for current functional status Refer to Care Plan for Long Term Goals  Recommendations for other services: None  Discharge Criteria: Patient will be discharged from OT if patient refuses treatment 3 consecutive times without medical reason, if treatment goals not met, if there is a change in medical status, if patient makes no progress towards goals or if patient is discharged from hospital.  The above assessment, treatment plan, treatment alternatives and goals were discussed and mutually agreed upon: by patient  OT eval completed this date. ADL re-training with focus on ADL performance, functional transfers, dynamic standing balance, and Bil UE strength and endurance. Patient had one LOB while standing in shower to perform LB bathing. Patient was able to self correct with grab bar. Patient required min vc's for safety awareness during functional mobility within room. Overall, patient performed well during eval and ADL. I do not anticipate a long length of stay.  Limmie Patricia, OTR/L 04/07/2012, 8:16 AM

## 2012-04-07 NOTE — Evaluation (Signed)
Physical Therapy Assessment and Plan  Patient Details  Name: Vincent [name] MRN: [id number] Date of Birth: 02-15-1936  PT Diagnosis: Difficulty walking and Muscle weakness Rehab Potential:   ELOS: 5-7 days   Today's Date: 04/07/2012 Time: 0272-5366 and 1500-1530   and 30 min  Problem List:  Patient Active Problem List  Diagnosis  . Hypertension  . Irregular heartbeat  . Prostate cancer  . Acute on chronic diastolic CHF (congestive heart failure)  . Atrial fibrillation with rapid ventricular response, history of PAF was in SR in 08/2011  . DM2 (diabetes mellitus, type 2)  . Leukocytosis  . Bacteremia  . CKD (chronic kidney disease) stage 3, GFR 30-59 ml/min  . LA thrombus  . Nausea and vomiting  . Healthcare-associated pneumonia  . Multifocal atrial tachycardia  . Chronic systolic heart failure  . UTI (lower urinary tract infection)  . Normocytic anemia  . PNA (pneumonia)    Past Medical History:  Past Medical History  Diagnosis Date  . Diabetes mellitus   . Hypertension   . CHF (congestive heart failure)   . Cancer   . Pneumonia   . Irregular heartbeat   . Prostate cancer   . Shortness of breath   . GERD (gastroesophageal reflux disease)   . Acute on chronic diastolic CHF (congestive heart failure) 12/18/2011  . Atrial fibrillation with rapid ventricular response, history of PAF was in SR in 08/2011 12/18/2011  . CKD (chronic kidney disease) stage 3, GFR 30-59 ml/min 12/26/2011   Past Surgical History:  Past Surgical History  Procedure Date  . Prostate surgery   . Tee without cardioversion 12/27/2011    Procedure: TRANSESOPHAGEAL ECHOCARDIOGRAM (TEE);  Surgeon: Chrystie Nose, MD;  Location: [facility] OR;  Service: Cardiovascular;  Laterality: N/A;  MD request Main OR  . Cardioversion 12/27/2011    Procedure: CARDIOVERSION;  Surgeon: Chrystie Nose, MD;  Location: [facility] OR;  Service: Cardiovascular;  Laterality: N/A;    Assessment & Plan Clinical Impression: Vincent [name] is a 76 y.o. right-handed male with history of diabetes mellitus, congestive heart failure, atrial fibrillation as well as left atrial thrombus with chronic Coumadin and chronic renal insufficiency with baseline creatinine 1. 53. Patient with recent admission 9/ 2013 for coagulase-negative staph bacteremia a TEE negative. Admitted 03/31/2012 with nausea, vomiting and new cough. Patient recently discharged from the hospital in September for cardiac stenting as well as bacteremia. Noted low-grade fever 100.3. Chest x-ray with chronic findings questionable pneumonia.. Placed on broad-spectrum antibiotics for suspect healthcare associated pneumonia and presently maintained on Levaquin as well as Tamiflu. Patient continues with droplet precautions. He remains on chronic Coumadin therapy with no bleeding episodes and latest INR of 1.84 as well as intravenous heparin until INR greater than 2.00. Physical and occupational therapy evaluations are completed. Bouts of confusion felt to be mild encephalopathy he continues to improve. Patient transferred to CIR on 04/06/2012 .   Patient currently requires supervision with mobility secondary to muscle weakness, decreased cardiorespiratoy endurance and .  Prior to hospitalization, patient was mod I with mobility and lived with Spouse (Wife is w/c bound and an amputee) in a House home.  Home access is 3Stairs to enter;Level entry;Ramped entrance;Other (comment) (Patient enters home through ramp in garage).  Patient will benefit from skilled PT intervention to maximize safe functional mobility, minimize fall risk and decrease caregiver burden for planned discharge home with intermittent assist.  Anticipate patient will benefit from follow up [facility] at discharge.  PT -  End of Session Activity Tolerance: Endurance does not limit participation in activity Endurance Deficit: Yes PT Assessment Rehab Potential: Good Barriers to Discharge: Decreased caregiver support PT  Plan PT Intensity: Minimum of 1-2 x/day ,45 to 90 minutes PT Frequency: 5 out of 7 days PT Duration Estimated Length of Stay: 5-7 days PT Treatment/Interventions: Ambulation/gait training;Balance/vestibular training;Community reintegration;DME/adaptive equipment instruction;Discharge planning;Functional mobility training;Neuromuscular re-education;Pain management;Patient/family education;Psychosocial support;Therapeutic Exercise;Therapeutic Activities;Stair training;UE/LE Strength taining/ROM;UE/LE Coordination activities;Wheelchair propulsion/positioning PT Recommendation Follow Up Recommendations: Home health PT Patient destination: Home Equipment Recommended: Wheelchair (measurements);Wheelchair cushion (measurements) Equipment Details: May require wheelchair for longer distances, assessment ongoing  PT Evaluation Precautions/Restrictions Precautions Precautions: Fall Precaution Comments: Droplet Precautions Restrictions Weight Bearing Restrictions: No Vital Signs Therapy Vitals Pulse Rate: 84  Oxygen Therapy SpO2: 96 % O2 Device: None (Room air) Pulse Oximetry Type: Intermittent Pain Pain Assessment Pain Assessment: No/denies pain Pain Score: 0-No pain Home Living/Prior Functioning Home Living Lives With: Spouse (Wife is w/c bound and an amputee) Available Help at Discharge: Family;Other (Comment) (Son in Social worker - does not live nearby) Type of Home: House Home Access: Stairs to enter;Level entry;Ramped entrance;Other (comment) (Patient enters home through ramp in garage) Entrance Stairs-Number of Steps: 3 Entrance Stairs-Rails: None Home Layout: Two level;Able to live on main level with bedroom/bathroom Alternate Level Stairs-Number of Steps: Patient does not use upstairs Bathroom Shower/Tub: Tub/shower unit;Walk-in shower Bathroom Toilet: Handicapped height Bathroom Accessibility: Yes How Accessible: Accessible via walker Home Adaptive Equipment: Walker - rolling;Tub  transfer bench;Walker - four wheeled;Straight cane Prior Function Level of Independence: Independent with basic ADLs;Independent with gait;Independent with transfers Able to Take Stairs?: No Driving: No Vocation: Retired Gaffer: Retired Personnel officer; retired Hydrographic surveyor - History Baseline Vision:  (Pt needs glasses. Unable to read fine print) Patient Visual Report: No change from baseline  Cognition Overall Cognitive Status: Appears within functional limits for tasks assessed Arousal/Alertness: Awake/alert Orientation Level: Oriented X4 Sensation Sensation Light Touch: Appears Intact Stereognosis: Appears Intact Hot/Cold: Appears Intact Proprioception: Appears Intact Coordination Gross Motor Movements are Fluid and Coordinated: Yes Fine Motor Movements are Fluid and Coordinated: Yes Motor  Motor Motor: Within Functional Limits  Mobility Bed Mobility Bed Mobility: Sit to Supine;Supine to Sit Supine to Sit: 5: Supervision;HOB flat;With rails Supine to Sit Details: Verbal cues for precautions/safety Sit to Supine: 5: Supervision;HOB flat Sit to Supine - Details: Verbal cues for precautions/safety Transfers Sit to Stand: 5: Supervision;With armrests;From chair/3-in-1;With upper extremity assist;From bed Sit to Stand Details: Verbal cues for precautions/safety;Verbal cues for technique Stand to Sit: 5: Supervision;With armrests;To chair/3-in-1;To bed;With upper extremity assist Stand to Sit Details (indicate cue type and reason): Verbal cues for precautions/safety;Verbal cues for technique PM Session: Patient with requests to use the bathroom upon arrival in room. Patient ambulated to bathroom with rolling walker and transfers to toilet with supervision. Patient able to adjust clothing prior to and after toileting and perform hygiene independently. Locomotion  Ambulation Ambulation: Yes Ambulation/Gait Assistance: 5:  Supervision Ambulation Distance (Feet): 174 Feet x1 Assistive device: Rolling walker Ambulation/Gait Assistance Details: Verbal cues for gait pattern;Verbal cues for precautions/safety;Verbal cues for safe use of DME/AE Gait Gait: Yes Gait Pattern: Step-through pattern;Trunk flexed;Decreased stride length Stairs / Additional Locomotion Stairs: Yes Stairs Assistance: 5: Supervision Stairs Assistance Details: Verbal cues for gait pattern;Verbal cues for precautions/safety;Verbal cues for sequencing;Verbal cues for technique Stair Management Technique: Two rails;Forwards;Step to pattern Number of Stairs: 2  Height of Stairs: 4  Wheelchair Mobility Wheelchair Mobility: Yes Wheelchair Assistance: 4: Min  assist Wheelchair Assistance Details: Tactile cues for initiation;Verbal cues for precautions/safety;Verbal cues for safe use of DME/AE;Verbal cues for sequencing;Verbal cues for technique Wheelchair Propulsion: Both upper extremities;Both lower extermities Wheelchair Parts Management: Needs assistance Distance: 50   Balance Balance Balance Assessed: Yes Standardized Balance Assessment Standardized Balance Assessment: Berg Balance Test Berg Balance Test Sit to Stand: Able to stand  independently using hands Standing Unsupported: Able to stand 2 minutes with supervision Sitting with Back Unsupported but Feet Supported on Floor or Stool: Able to sit safely and securely 2 minutes Stand to Sit: Controls descent by using hands Transfers: Able to transfer safely, definite need of hands Standing Unsupported with Eyes Closed: Able to stand 10 seconds with supervision Standing Ubsupported with Feet Together: Able to place feet together independently and stand for 1 minute with supervision From Standing, Reach Forward with Outstretched Arm: Can reach forward >5 cm safely (2") From Standing Position, Pick up Object from Floor: Unable to pick up shoe, but reaches 2-5 cm (1-2") from shoe and  balances independently From Standing Position, Turn to Look Behind Over each Shoulder: Looks behind one side only/other side shows less weight shift Turn 360 Degrees: Needs close supervision or verbal cueing Standing Unsupported, Alternately Place Feet on Step/Stool: Needs assistance to keep from falling or unable to try Standing Unsupported, One Foot in Front: Able to take small step independently and hold 30 seconds Standing on One Leg: Unable to try or needs assist to prevent fall Total Score: 32/ 56 indicating patient is at high risk for falls (~100%). Extremity Assessment  RUE Assessment RUE Assessment: Within Functional Limits (MMT: 4/5 A/ROM WFL in all ranges) LUE Assessment LUE Assessment: Within Functional Limits (MMT: 4/5 A/ROM WFL in all ranges.) RLE Assessment RLE Assessment: Exceptions to [facility] RLE Strength RLE Overall Strength: Deficits RLE Overall Strength Comments: Grossly 4-/5 LLE Assessment LLE Assessment: Exceptions to [facility] LLE Strength LLE Overall Strength: Within Functional Limits for tasks assessed LLE Overall Strength Comments: Grossly 4+/5, knee ext 4/5  See FIM for current functional status Refer to Care Plan for Long Term Goals  Recommendations for other services: None  Discharge Criteria: Patient will be discharged from PT if patient refuses treatment 3 consecutive times without medical reason, if treatment goals not met, if there is a change in medical status, if patient makes no progress towards goals or if patient is discharged from hospital.  The above assessment, treatment plan, treatment alternatives and goals were discussed and mutually agreed upon: by patient  Chipper Herb. Rodnisha Blomgren, PT, DPT  04/07/2012, 3:27 PM

## 2012-04-07 NOTE — Plan of Care (Signed)
Overall Plan of Care [facility]) Patient Details Name: Vincent [name] MRN: [id number] DOB: 09/01/1935  Diagnosis:  Rehabilitation for deconditioning  Primary Diagnosis:    Respiratory failure due to pneumonia Co-morbidities: diabetes mellitus, congestive heart failure, atrial fibrillation as well as left atrial thrombus with chronic Coumadin and chronic renal insufficiency   Functional Problem List  Patient demonstrates impairments in the following areas: Balance, Edema, Endurance, Medication Management, Motor and Safety  Basic ADL's: grooming, bathing, dressing and toileting Advanced ADL's: simple meal preparation and light housekeeping  Transfers:  bed mobility, bed to chair, toilet, tub/shower, car, furniture and floor Locomotion:  ambulation, wheelchair mobility and stairs  Additional Impairments:  None  Anticipated Outcomes Item Anticipated Outcome  Eating/Swallowing  Independent  Basic self-care  Modified Independent  Tolieting  Modified Independent  Bowel/Bladder  Min assist  Transfers  Toilet and shower: Modified Independent; Mod I all transfers, including car  Locomotion  Mod I 150' with RW, 4 steps, curbs and ramps  Communication    Cognition    Pain  < = 2  Safety/Judgment  No unsafe behavior  Other     Therapy Plan: PT Intensity: Minimum of 1-2 x/day ,45 to 90 minutes PT Frequency: 5 out of 7 days PT Duration Estimated Length of Stay: 7 days OT Intensity: Minimum of 1-2 x/day, 45 to 90 minutes OT Frequency: 5 out of 7 days OT Duration/Estimated Length of Stay: ~ 7 days      Team Interventions: Item RN PT OT SLP SW TR Other  Self Care/Advanced ADL Retraining   x      Neuromuscular Re-Education  x       Therapeutic Activities  x x      UE/LE Strength Training/ROM  x x      UE/LE Coordination Activities  x x      Visual/Perceptual Remediation/Compensation         DME/Adaptive Equipment Instruction  x x      Therapeutic Exercise  x x       Balance/Vestibular Training  x x      Patient/Family Education x x x      Cognitive Remediation/Compensation         Functional Mobility Training  x x      Ambulation/Gait Training  x       Museum/gallery curator  x       Wheelchair Propulsion/Positioning  x       Functional Tourist information centre manager Reintegration   x      Dysphagia/Aspiration Film/video editor         Bladder Management         Bowel Management         Disease Management/Prevention         Pain Management  x       Medication Management x        Skin Care/Wound Management x        Splinting/Orthotics         Discharge Planning  x x      Psychosocial Support  x                              Team Discharge Planning: Destination: PT-Home health PT ,OT- None ,  SLP-  Projected Follow-up: PT- , OT-   , SLP-  Projected Equipment  Needs: PT-None, OT-  None, SLP-  Patient/family involved in discharge planning: PT- Patient,  OT-Patient, SLP-   MD ELOS: 10 days Medical Rehab Prognosis:  Good Assessment: 76 year old male with morbid obesity COPD admitted for pneumonia which progressed to respiratory failure. Now requiring 24 7 rehabilitation RN M.D. As well as CIR level PT OT    See Team Conference Notes for weekly updates to the plan of care Physical Therapy Assessment and Plan  Patient Details  Name: Vincent [name] MRN: [id number] Date of Birth: 1935-06-16  PT Diagnosis: Abnormality of gait, Difficulty walking and Muscle weakness Rehab Potential: Good ELOS:   5-7 days  Today's Date: 04/07/2012 Time: 5621-3086 Time Calculation (min): 60 min  Problem List:  Patient Active Problem List  Diagnosis  . Hypertension  . Irregular heartbeat  . Prostate cancer  . Acute on chronic diastolic CHF (congestive heart failure)  . Atrial fibrillation with rapid ventricular response, history of PAF was in SR in 08/2011  . DM2 (diabetes mellitus, type 2)  . Leukocytosis  .  Bacteremia  . CKD (chronic kidney disease) stage 3, GFR 30-59 ml/min  . LA thrombus  . Nausea and vomiting  . Healthcare-associated pneumonia  . Multifocal atrial tachycardia  . Chronic systolic heart failure  . UTI (lower urinary tract infection)  . Normocytic anemia  . PNA (pneumonia)    Past Medical History:  Past Medical History  Diagnosis Date  . Diabetes mellitus   . Hypertension   . CHF (congestive heart failure)   . Cancer   . Pneumonia   . Irregular heartbeat   . Prostate cancer   . Shortness of breath   . GERD (gastroesophageal reflux disease)   . Acute on chronic diastolic CHF (congestive heart failure) 12/18/2011  . Atrial fibrillation with rapid ventricular response, history of PAF was in SR in 08/2011 12/18/2011  . CKD (chronic kidney disease) stage 3, GFR 30-59 ml/min 12/26/2011   Past Surgical History:  Past Surgical History  Procedure Date  . Prostate surgery   . Tee without cardioversion 12/27/2011    Procedure: TRANSESOPHAGEAL ECHOCARDIOGRAM (TEE);  Surgeon: Chrystie Nose, MD;  Location: [facility] OR;  Service: Cardiovascular;  Laterality: N/A;  MD request Main OR  . Cardioversion 12/27/2011    Procedure: CARDIOVERSION;  Surgeon: Chrystie Nose, MD;  Location: [facility] OR;  Service: Cardiovascular;  Laterality: N/A;    Assessment & Plan Clinical Impression: Patient is a 76 y.o. year old male with recent admission to the hospital on 03/31/2012 with pneumonia.  Patient transferred to CIR on 04/06/2012 .   Patient currently requires supervision with mobility secondary to muscle weakness and decreased cardiorespiratoy endurance.  Prior to hospitalization, patient was mod I with mobility and lived with Spouse (Wife is w/c bound and an amputee) in a House home.  Home access is 3Stairs to enter;Level entry;Ramped entrance;Other (comment) (Patient enters home through ramp in garage).  Patient will benefit from skilled PT intervention to maximize safe functional mobility,  minimize fall risk and decrease caregiver burden for planned discharge home alone.  Anticipate patient will benefit from follow up HH at discharge.     PT Evaluation   Mobility Bed Mobility Bed Mobility: Sit to Supine;Supine to Sit Supine to Sit: 5: Supervision;HOB flat;With rails Supine to Sit Details: Verbal cues for precautions/safety Sit to Supine: 5: Supervision;HOB flat Sit to Supine - Details: Verbal cues for precautions/safety Transfers Sit to Stand: 5: Supervision;With armrests;From chair/3-in-1;With upper extremity assist;From bed Sit to Stand  Details: Verbal cues for precautions/safety;Verbal cues for technique Stand to Sit: 5: Supervision;With armrests;To chair/3-in-1;To bed;With upper extremity assist Stand to Sit Details (indicate cue type and reason): Verbal cues for precautions/safety;Verbal cues for technique Locomotion  Ambulation Ambulation: Yes Ambulation/Gait Assistance: 5: Supervision Ambulation Distance (Feet): 174 Feet Assistive device: Rolling walker Ambulation/Gait Assistance Details: Verbal cues for gait pattern;Verbal cues for precautions/safety;Verbal cues for safe use of DME/AE Gait Gait: Yes Gait Pattern: Step-through pattern;Trunk flexed;Decreased stride length Stairs / Additional Locomotion Stairs: Yes Stairs Assistance: 5: Supervision Stairs Assistance Details: Verbal cues for gait pattern;Verbal cues for precautions/safety;Verbal cues for sequencing;Verbal cues for technique Stair Management Technique: Two rails;Forwards;Step to pattern Number of Stairs: 2  Height of Stairs: 4  Wheelchair Mobility Wheelchair Mobility: Yes Wheelchair Assistance: 4: Administrator, sports Details: Tactile cues for initiation;Verbal cues for precautions/safety;Verbal cues for safe use of DME/AE;Verbal cues for sequencing;Verbal cues for Diplomatic Services operational officer: Both upper extremities;Both lower extermities Wheelchair Parts Management: Needs  assistance Distance: 50   See FIM for current functional status Refer to Care Plan for Long Term Goals  Recommendations for other services: None  Discharge Criteria: Patient will be discharged from PT if patient refuses treatment 3 consecutive times without medical reason, if treatment goals not met, if there is a change in medical status, if patient makes no progress towards goals or if patient is discharged from hospital.  The above assessment, treatment plan, treatment alternatives and goals were discussed and mutually agreed upon: by patient  Claudette Laws E 04/07/2012, 1:01 PM

## 2012-04-07 NOTE — Patient Care Conference (Signed)
Inpatient RehabilitationTeam Conference and Plan of Care Update Date: 04/07/2012   Time: 2:40 PM    Patient Name: Vincent [name]      Medical Record Number: [id number]  Date of Birth: [birth year] Sex: Male         Room/Bed: 4035/4035-01 Payor Info: Payor: MEDICARE  Plan: MEDICARE PART A AND B  Product Type: *No Product type*     Admitting Diagnosis: Decond  Admit Date/Time:  04/06/2012  4:11 PM Admission Comments: No comment available   Primary Diagnosis:  PNA (pneumonia) Principal Problem: PNA (pneumonia)  Patient Active Problem List   Diagnosis Date Noted  . PNA (pneumonia) 04/07/2012  . UTI (lower urinary tract infection) 04/02/2012  . Normocytic anemia 04/02/2012  . Nausea and vomiting 03/31/2012  . Healthcare-associated pneumonia 03/31/2012  . Multifocal atrial tachycardia 03/31/2012  . Chronic systolic heart failure 03/31/2012  . LA thrombus 12/27/2011  . CKD (chronic kidney disease) stage 3, GFR 30-59 ml/min 12/26/2011  . Bacteremia 12/20/2011  . Acute on chronic diastolic CHF (congestive heart failure) 12/18/2011  . Atrial fibrillation with rapid ventricular response, history of PAF was in SR in 08/2011 12/18/2011  . DM2 (diabetes mellitus, type 2) 12/18/2011  . Leukocytosis 12/18/2011  . Hypertension   . Irregular heartbeat   . Prostate cancer     Expected Discharge Date: Expected Discharge Date: 04/11/12  Team Members Present: Physician leading conference: Dr. Claudette Laws Social Worker Present: Amada Jupiter, LCSW Nurse Present: Other (comment) Kennon Portela, RN) PT Present: Other (comment) Clarisse Gouge Ripa, PT) OT Present: Mackie Pai, Felipa Eth, OT     Current Status/Progress Goal Weekly Team Focus  Medical   Deconditioning from respiratory failure,diabetes mellitus, congestive heart failure, atrial fibrillation as well as left atrial thrombus with chronic Coumadin   Maintain medical stability during rehabilitation course  Improved diabetic management  wean steroids   Bowel/Bladder   Pt continent of bowel and bladder         Swallow/Nutrition/ Hydration             ADL's   min assist - supervision with ADL's  Modified Independent      Mobility   supervision bed mobility, transfers, ambulation of 170' with RW, 2 stairs with B rails  Mod I all functional mobility  Increasing activity tolerance, LE strengthening, balance   Communication             Safety/Cognition/ Behavioral Observations            Pain   no complains of pain         Skin   skin clean, dry and intact. Scattered bruising.   No new skin breakdown on rehab unit  assess skin qshift      *See Interdisciplinary Assessment and Plan and progress notes for long and short-term goals  Barriers to Discharge: See above    Possible Resolutions to Barriers:  See above    Discharge Planning/Teaching Needs:  home with wife who is w/c bound but independent      Team Discussion:  New admit doing very well - already at supervision with mod i goals.  Short LOS.  No concerns. Nursing to educate pt about affects of steroid taper on DM and lantus needs.  Revisions to Treatment Plan:  None   Continued Need for Acute Rehabilitation Level of Care: The patient requires daily medical management by a physician with specialized training in physical medicine and rehabilitation for the following conditions: Daily direction of a multidisciplinary  physical rehabilitation program to ensure safe treatment while eliciting the highest outcome that is of practical value to the patient.: Yes Daily medical management of patient stability for increased activity during participation in an intensive rehabilitation regime.: Yes Daily analysis of laboratory values and/or radiology reports with any subsequent need for medication adjustment of medical intervention for : Other;Pulmonary problems  Vincent [name] 04/07/2012, 2:07 PM

## 2012-04-07 NOTE — Progress Notes (Signed)
Patient ID: Vincent [name], male   DOB: 12-15-1935, 76 y.o.   MRN: [id number]  Subjective/Complaints: 76 y.o. right-handed male with history of diabetes mellitus, congestive heart failure, atrial fibrillation as well as left atrial thrombus with chronic Coumadin and chronic renal insufficiency with baseline creatinine 1. 53. Patient with recent admission 9/ 2013 for coagulase-negative staph bacteremia a TEE negative. Admitted 03/31/2012 with nausea, vomiting and new cough. Patient recently discharged from the hospital in September for cardiac stenting as well as bacteremia. Noted low-grade fever 100.3. Chest x-ray with chronic findings questionable pneumonia.. Placed on broad-spectrum antibiotics for suspect healthcare associated pneumonia and presently maintained on Levaquin as well as Tamiflu. Blood cultures and sputum cultures negative  Gained weight since hospitalization  Review of Systems  Constitutional: Negative for fever.  Respiratory: Negative for shortness of breath and wheezing.   Cardiovascular: Negative for chest pain and orthopnea.  All other systems reviewed and are negative.    Objective: Vital Signs: Blood pressure 130/81, pulse 84, temperature 97.6 F (36.4 C), temperature source Oral, resp. rate 20, height 5' 8.9" (1.75 m), weight 116.7 kg (257 lb 4.4 oz), SpO2 96.00%. No results found. Results for orders placed during the hospital encounter of 04/06/12 (from the past 72 hour(s))  GLUCOSE, CAPILLARY     Status: Abnormal   Collection Time   04/06/12  4:46 PM      Component Value Range Comment   Glucose-Capillary 440 (*) 70 - 99 mg/dL    Comment 1 Notify RN     GLUCOSE, CAPILLARY     Status: Abnormal   Collection Time   04/06/12  8:59 PM      Component Value Range Comment   Glucose-Capillary 345 (*) 70 - 99 mg/dL    Comment 1 Notify RN         General: Alert and oriented x 2-3, No apparent distress.  HEENT: Head is normocephalic, atraumatic, PERRLA, EOMI, sclera  anicteric, oral mucosa pink and moist, dentition intact, ext ear canals clear,  Neck: Supple without JVD or lymphadenopathy  Heart: Reg rate and rhythm. No murmurs rubs or gallops  Chest: no wheezes, decreased breath sounds at bases, no rhonchi; no distress  Abdomen: Soft, non-tender, non-distended, bowel sounds positive.  Extremities: No clubbing, cyanosis; 1+ lower ext edema. Pulses are 2+  Skin: Clean and intact without signs of breakdown  Neuro: Pt is cognitively appropriate with fair insight.. Cranial nerves 2-12 are intact. Sensory exam is normal. Reflexes are 2+ in all 4's. Fine motor coordination is intact.Motor function is grossly 4/5 UE. LE 4- proximally, 4/5 distally. Mild sensory loss distally in stocking glove distribution over both legs.  Musculoskeletal: . Posture appropriate  Psych: Pt's affect is appropriate. Pt is cooperative   Assessment/Plan: 1. Functional deficits secondary to deconditioning after pneumonia which require 3+ hours per day of interdisciplinary therapy in a comprehensive inpatient rehab setting. Physiatrist is providing close team supervision and 24 hour management of active medical problems listed below. Physiatrist and rehab team continue to assess barriers to discharge/monitor patient progress toward functional and medical goals. FIM: FIM - Bathing Bathing Steps Patient Completed: Chest;Right Arm;Left Arm;Abdomen;Front perineal area;Buttocks;Right upper leg;Left upper leg Bathing: 4: Min-Patient completes 8-9 71f 10 parts or 75+ percent  FIM - Upper Body Dressing/Undressing Upper body dressing/undressing steps patient completed: Thread/unthread right sleeve of pullover shirt/dresss;Thread/unthread left sleeve of pullover shirt/dress;Put head through opening of pull over shirt/dress;Pull shirt over trunk Upper body dressing/undressing: 5: Set-up assist to: Obtain clothing/put away FIM -  Lower Body Dressing/Undressing Lower body dressing/undressing steps  patient completed: Thread/unthread right pants leg;Thread/unthread left pants leg;Pull pants up/down;Don/Doff right sock;Don/Doff left sock Lower body dressing/undressing: 5: Set-up assist to: Obtain clothing  FIM - Toileting Toileting steps completed by patient: Adjust clothing prior to toileting;Performs perineal hygiene;Adjust clothing after toileting Toileting: 5: Supervision: Safety issues/verbal cues  FIM - Archivist Transfers Assistive Devices: Art gallery manager Transfers: 5-To toilet/BSC: Supervision (verbal cues/safety issues);5-From toilet/BSC: Supervision (verbal cues/safety issues)  FIM - Banker Devices: Walker;Bed rails;Arm rests Bed/Chair Transfer: 5: Supine > Sit: Supervision (verbal cues/safety issues);5: Sit > Supine: Supervision (verbal cues/safety issues);5: Bed > Chair or W/C: Supervision (verbal cues/safety issues);5: Chair or W/C > Bed: Supervision (verbal cues/safety issues)  FIM - Locomotion: Wheelchair Distance: 50 Locomotion: Wheelchair: 2: Travels 50 - 149 ft with minimal assistance (Pt.>75%) FIM - Locomotion: Ambulation Locomotion: Ambulation Assistive Devices: Designer, industrial/product Ambulation/Gait Assistance: 5: Supervision Locomotion: Ambulation: 5: Travels 150 ft or more with supervision/safety issues  Comprehension Comprehension Mode: Auditory Comprehension: 5-Follows basic conversation/direction: With extra time/assistive device  Expression Expression Mode: Verbal Expression: 5-Expresses basic needs/ideas: With no assist  Social Interaction Social Interaction: 5-Interacts appropriately 90% of the time - Needs monitoring or encouragement for participation or interaction.  Problem Solving Problem Solving: 4-Solves basic 75 - 89% of the time/requires cueing 10 - 24% of the time  Memory Memory: 4-Recognizes or recalls 75 - 89% of the time/requires cueing 10 - 24% of the time  Medical Problem List and  Plan:  1. Deconditioning after pneumonia/mild encephalopathy  2. DVT Prophylaxis/Anticoagulation: Chronic Coumadin therapy for left atrial thrombus. Continue intravenous heparin until INR greater than 2.00. Monitor for any bleeding episodes  3. Neuropsych: This patient Is capable of making decisions on his/her own behalf.  4. Healthcare associated pneumonia. Presently patient continues on Levaquin as well as Tamiflu. Taper Solu-Medrol as directed and confirmed duration of antibiotics. Followup chest x-ray and check oxygen saturations every shift. Plan is for Solu-Medrol times one more day and then prednisone 50 mg by mouth daily x5 days stop  5. Diabetes mellitus. Hemoglobin A1c of 9.1. Monitor closely while on steroids increase Lantus insulin 25 units at bedtime as well as NovoLog 10 units 3 times a day. Check blood sugars a.c. and at bedtime  6. Hypertension/atrial fibrillation. Cardizem 300 mg daily. Tenormin 100 mg twice a day resumed today. Monitor with increased activity  7. Congestive heart failure. Monitor for any signs of fluid overload. Patient on no present diuretics. Patient was on Lasix 40 mg twice a day prior to admission and resumed today  8. Chronic renal insufficiency with baseline creatinine 1. 53. Latest creatinine 1.73    LOS (Days) 1 A FACE TO FACE EVALUATION WAS PERFORMED  Kyreese Chio E 04/07/2012, 9:36 AM

## 2012-04-07 NOTE — Progress Notes (Signed)
ANTICOAGULATION CONSULT NOTE - Follow Up Consult  Pharmacy Consult for coumadin and lovenox Indication: atrial fibrillation  No Known Allergies  Patient Measurements: Height: 5' 8.9" (175 cm) Weight: 257 lb 4.4 oz (116.7 kg) IBW/kg (Calculated) : 70.47  Heparin Dosing Weight:   Vital Signs: Temp: 97.6 F (36.4 C) (12/24 0633) Temp src: Oral (12/24 0633) BP: 130/81 mmHg (12/24 0633) Pulse Rate: 84  (12/24 0844)  Labs:  Basename 04/07/12 0854 04/06/12 0430 04/05/12 0452  HGB 11.0* 10.8* --  HCT 33.7* 33.6* --  PLT 175 157 --  APTT -- -- --  LABPROT 28.4* 20.6* 19.8*  INR 2.84* 1.84* 1.75*  HEPARINUNFRC -- 0.41 0.37  CREATININE 1.33 -- --  CKTOTAL -- -- --  CKMB -- -- --  TROPONINI -- -- --    Estimated Creatinine Clearance: 61.3 ml/min (by C-G formula based on Cr of 1.33).   Medications:  Scheduled:    . albuterol  2.5 mg Nebulization BID  . allopurinol  150 mg Oral Daily  . antiseptic oral rinse  15 mL Mouth Rinse BID  . atenolol  100 mg Oral BID  . diltiazem  300 mg Oral Daily  . feeding supplement  1 Container Oral TID BM  . furosemide  40 mg Oral BID  . insulin aspart  0-20 Units Subcutaneous TID WC  . insulin aspart  0-5 Units Subcutaneous QHS  . insulin aspart  10 Units Subcutaneous TID WC  . insulin glargine  25 Units Subcutaneous QHS  . ipratropium  0.5 mg Nebulization BID  . levofloxacin  750 mg Oral Daily  . oseltamivir  75 mg Oral BID  . potassium chloride  20 mEq Oral BID  . predniSONE  50 mg Oral Q breakfast  . Warfarin - Pharmacist Dosing Inpatient   Does not apply q1800  . [DISCONTINUED] albuterol  2.5 mg Nebulization Q4H  . [DISCONTINUED] diltiazem  300 mg Oral q morning - 10a  . [DISCONTINUED] enoxaparin (LOVENOX) injection  115 mg Subcutaneous Q12H  . [DISCONTINUED] enoxaparin (LOVENOX) injection  115 mg Subcutaneous Q12H  . [DISCONTINUED] insulin aspart  0-5 Units Subcutaneous QHS  . [DISCONTINUED] insulin aspart  5 Units Subcutaneous  TID WC  . [DISCONTINUED] insulin glargine  19 Units Subcutaneous QHS  . [DISCONTINUED] ipratropium  0.5 mg Nebulization Q4H  . [DISCONTINUED] levofloxacin (LEVAQUIN) IV  750 mg Intravenous Q24H  . [DISCONTINUED] methylPREDNISolone (SOLU-MEDROL) injection  80 mg Intravenous Q8H  . [DISCONTINUED] oseltamivir  75 mg Oral BID  . [DISCONTINUED] warfarin  8 mg Oral ONCE-1800  . [DISCONTINUED] warfarin  8 mg Oral Once  . [DISCONTINUED] Warfarin - Pharmacist Dosing Inpatient   Does not apply q1800   Infusions:    . [DISCONTINUED] heparin      Assessment: 76 yo male with hx of afib is currently on therapeutic coumadin also bridging with treatment dose of lovenox.  INR jumped from 1.84 from 2.84.  H/H 11/33.7; Plt 175 (stable).  On levaquin and allopurinol.  Goal of Therapy:  INR 2-3 Monitor platelets by anticoagulation protocol: Yes   Plan:  1) Coumadin 4mg  po x1 2) INR in am 3) D/c lovenox (INR >/= 2)   Darcell Yacoub, Tsz-Yin 04/07/2012,11:51 AM

## 2012-04-08 LAB — GLUCOSE, CAPILLARY
Glucose-Capillary: 217 mg/dL — ABNORMAL HIGH (ref 70–99)
Glucose-Capillary: 205 mg/dL — ABNORMAL HIGH (ref 70–99)

## 2012-04-08 NOTE — Progress Notes (Signed)
ANTICOAGULATION CONSULT NOTE - Follow Up Consult  Pharmacy Consult for coumadin and lovenox Indication: atrial fibrillation  No Known Allergies  Patient Measurements: Height: 5' 8.9" (175 cm) Weight: 257 lb 8 oz (116.8 kg) IBW/kg (Calculated) : 70.47  Heparin Dosing Weight:   Vital Signs: Temp: 97.3 F (36.3 C) (12/25 0446) Temp src: Oral (12/25 0446) BP: 128/77 mmHg (12/25 0446) Pulse Rate: 110  (12/25 0446)  Labs:  Basename 04/08/12 0500 04/07/12 0854 04/06/12 0430  HGB -- 11.0* 10.8*  HCT -- 33.7* 33.6*  PLT -- 175 157  APTT -- -- --  LABPROT 34.7* 28.4* 20.6*  INR 3.73* 2.84* 1.84*  HEPARINUNFRC -- -- 0.41  CREATININE -- 1.33 --  CKTOTAL -- -- --  CKMB -- -- --  TROPONINI -- -- --    Estimated Creatinine Clearance: 61.3 ml/min (by C-G formula based on Cr of 1.33).   Medications:  Scheduled:     . albuterol  2.5 mg Nebulization BID  . allopurinol  150 mg Oral Daily  . antiseptic oral rinse  15 mL Mouth Rinse BID  . atenolol  100 mg Oral BID  . diltiazem  300 mg Oral Daily  . feeding supplement  1 Container Oral TID BM  . furosemide  40 mg Oral BID  . insulin aspart  0-20 Units Subcutaneous TID WC  . insulin aspart  0-5 Units Subcutaneous QHS  . insulin aspart  10 Units Subcutaneous TID WC  . insulin glargine  25 Units Subcutaneous QHS  . ipratropium  0.5 mg Nebulization BID  . levofloxacin  750 mg Oral Daily  . [COMPLETED] oseltamivir  75 mg Oral BID  . potassium chloride  20 mEq Oral BID  . predniSONE  40 mg Oral Q breakfast  . [COMPLETED] warfarin  4 mg Oral ONCE-1800  . Warfarin - Pharmacist Dosing Inpatient   Does not apply q1800  . [DISCONTINUED] enoxaparin (LOVENOX) injection  115 mg Subcutaneous Q12H  . [DISCONTINUED] levofloxacin (LEVAQUIN) IV  750 mg Intravenous Q24H  . [DISCONTINUED] oseltamivir  75 mg Oral BID  . [DISCONTINUED] predniSONE  50 mg Oral Q breakfast   Infusions:    Assessment: 76 yo male with hx of afib is currently on  Coumadin. INR jumped to 3.73 from 2.84.  H/H 11/33.7; Plt 175 (stable). No report of bleeding. On levaquin and allopurinol.  Goal of Therapy:  INR 2-3 Monitor platelets by anticoagulation protocol: Yes   Plan:  1) Hold Coumadin tonight  2) INR in am 3) Monitor for signs and symptoms of bleeding   Franchot Erichsen, Pharm.D. Clinical Pharmacist   04/08/2012 10:51 AM

## 2012-04-08 NOTE — Progress Notes (Signed)
Patient ID: Vincent [name], male   DOB: 1935-09-02, 76 y.o.   MRN: [id number]  Subjective/Complaints: 76 y.o. right-handed male with history of diabetes mellitus, congestive heart failure, atrial fibrillation as well as left atrial thrombus with chronic Coumadin and chronic renal insufficiency with baseline creatinine 1. 53. Patient with recent admission 9/ 2013 for coagulase-negative staph bacteremia a TEE negative. Admitted 03/31/2012 with nausea, vomiting and new cough. Patient recently discharged from the hospital in September for cardiac stenting as well as bacteremia. Noted low-grade fever 100.3. Chest x-ray with chronic findings questionable pneumonia.. Placed on broad-spectrum antibiotics for suspect healthcare associated pneumonia and presently maintained on Levaquin as well as Tamiflu. Blood cultures and sputum cultures negative  Afebrile No breathing problems Feels ok , would like grounds pass Review of Systems  Constitutional: Negative for fever.  Respiratory: Negative for shortness of breath and wheezing.   Cardiovascular: Negative for chest pain and orthopnea.  All other systems reviewed and are negative.    Objective: Vital Signs: Blood pressure 128/77, pulse 110, temperature 97.3 F (36.3 C), temperature source Oral, resp. rate 20, height 5' 8.9" (1.75 m), weight 116.8 kg (257 lb 8 oz), SpO2 94.00%. No results found. Results for orders placed during the hospital encounter of 04/06/12 (from the past 72 hour(s))  GLUCOSE, CAPILLARY     Status: Abnormal   Collection Time   04/06/12  4:46 PM      Component Value Range Comment   Glucose-Capillary 440 (*) 70 - 99 mg/dL    Comment 1 Notify RN     GLUCOSE, CAPILLARY     Status: Abnormal   Collection Time   04/06/12  8:59 PM      Component Value Range Comment   Glucose-Capillary 345 (*) 70 - 99 mg/dL    Comment 1 Notify RN     GLUCOSE, CAPILLARY     Status: Abnormal   Collection Time   04/07/12  7:50 AM      Component Value  Range Comment   Glucose-Capillary 329 (*) 70 - 99 mg/dL    Comment 1 Notify RN     PROTIME-INR     Status: Abnormal   Collection Time   04/07/12  8:54 AM      Component Value Range Comment   Prothrombin Time 28.4 (*) 11.6 - 15.2 seconds    INR 2.84 (*) 0.00 - 1.49   CBC WITH DIFFERENTIAL     Status: Abnormal   Collection Time   04/07/12  8:54 AM      Component Value Range Comment   WBC 11.4 (*) 4.0 - 10.5 K/uL    RBC 3.70 (*) 4.22 - 5.81 MIL/uL    Hemoglobin 11.0 (*) 13.0 - 17.0 g/dL    HCT 78.2 (*) 95.6 - 52.0 %    MCV 91.1  78.0 - 100.0 fL    MCH 29.7  26.0 - 34.0 pg    MCHC 32.6  30.0 - 36.0 g/dL    RDW 21.3 (*) 08.6 - 15.5 %    Platelets 175  150 - 400 K/uL    Neutrophils Relative 90 (*) 43 - 77 %    Lymphocytes Relative 7 (*) 12 - 46 %    Monocytes Relative 3  3 - 12 %    Eosinophils Relative 0  0 - 5 %    Basophils Relative 0  0 - 1 %    Neutro Abs 10.3 (*) 1.7 - 7.7 K/uL    Lymphs Abs 0.8  0.7 - 4.0 K/uL    Monocytes Absolute 0.3  0.1 - 1.0 K/uL    Eosinophils Absolute 0.0  0.0 - 0.7 K/uL    Basophils Absolute 0.0  0.0 - 0.1 K/uL    RBC Morphology POLYCHROMASIA PRESENT     COMPREHENSIVE METABOLIC PANEL     Status: Abnormal   Collection Time   04/07/12  8:54 AM      Component Value Range Comment   Sodium 134 (*) 135 - 145 mEq/L    Potassium 4.4  3.5 - 5.1 mEq/L    Chloride 98  96 - 112 mEq/L    CO2 23  19 - 32 mEq/L    Glucose, Bld 429 (*) 70 - 99 mg/dL    BUN 33 (*) 6 - 23 mg/dL    Creatinine, Ser 1.61  0.50 - 1.35 mg/dL    Calcium 8.9  8.4 - 09.6 mg/dL    Total Protein 7.5  6.0 - 8.3 g/dL    Albumin 3.0 (*) 3.5 - 5.2 g/dL    AST 38 (*) 0 - 37 U/L    ALT 47  0 - 53 U/L    Alkaline Phosphatase 132 (*) 39 - 117 U/L    Total Bilirubin 0.5  0.3 - 1.2 mg/dL    GFR calc non Af Amer 51 (*) >90 mL/min    GFR calc Af Amer 59 (*) >90 mL/min   GLUCOSE, CAPILLARY     Status: Abnormal   Collection Time   04/07/12 11:17 AM      Component Value Range Comment    Glucose-Capillary 382 (*) 70 - 99 mg/dL    Comment 1 Notify RN     GLUCOSE, CAPILLARY     Status: Abnormal   Collection Time   04/07/12  4:07 PM      Component Value Range Comment   Glucose-Capillary 244 (*) 70 - 99 mg/dL   GLUCOSE, CAPILLARY     Status: Abnormal   Collection Time   04/07/12  9:34 PM      Component Value Range Comment   Glucose-Capillary 195 (*) 70 - 99 mg/dL    Comment 1 Notify RN     PROTIME-INR     Status: Abnormal   Collection Time   04/08/12  5:00 AM      Component Value Range Comment   Prothrombin Time 34.7 (*) 11.6 - 15.2 seconds    INR 3.73 (*) 0.00 - 1.49       General: Alert and oriented x 2-3, No apparent distress.  HEENT: Head is normocephalic, atraumatic, PERRLA, EOMI, sclera anicteric, oral mucosa pink and moist, dentition intact, ext ear canals clear,  Neck: Supple without JVD or lymphadenopathy  Heart: Reg rate and rhythm. No murmurs rubs or gallops  Chest: no wheezes, decreased breath sounds at bases, no rhonchi; no distress  Abdomen: Soft, non-tender, non-distended, bowel sounds positive.  Extremities: No clubbing, cyanosis; 1+ lower ext edema. Pulses are 2+  Skin: Clean and intact without signs of breakdown  Neuro: Pt is cognitively appropriate with fair insight.. Cranial nerves 2-12 are intact. Sensory exam is normal. Reflexes are 2+ in all 4's. Fine motor coordination is intact.Motor function is grossly 4/5 UE. LE 4- proximally, 4/5 distally. Mild sensory loss distally in stocking glove distribution over both legs.  Musculoskeletal: . Posture appropriate  Psych: Pt's affect is appropriate. Pt is cooperative   Assessment/Plan: 1. Functional deficits secondary to deconditioning after pneumonia which require 3+ hours per day of  interdisciplinary therapy in a comprehensive inpatient rehab setting. Physiatrist is providing close team supervision and 24 hour management of active medical problems listed below. Physiatrist and rehab team continue to  assess barriers to discharge/monitor patient progress toward functional and medical goals. FIM: FIM - Bathing Bathing Steps Patient Completed: Chest;Right Arm;Left Arm;Abdomen;Front perineal area;Buttocks;Right upper leg;Left upper leg Bathing: 4: Min-Patient completes 8-9 46f 10 parts or 75+ percent  FIM - Upper Body Dressing/Undressing Upper body dressing/undressing steps patient completed: Thread/unthread right sleeve of pullover shirt/dresss;Thread/unthread left sleeve of pullover shirt/dress;Put head through opening of pull over shirt/dress;Pull shirt over trunk Upper body dressing/undressing: 5: Set-up assist to: Obtain clothing/put away FIM - Lower Body Dressing/Undressing Lower body dressing/undressing steps patient completed: Thread/unthread right pants leg;Thread/unthread left pants leg;Pull pants up/down;Don/Doff right sock;Don/Doff left sock Lower body dressing/undressing: 5: Set-up assist to: Obtain clothing  FIM - Toileting Toileting steps completed by patient: Adjust clothing prior to toileting;Performs perineal hygiene;Adjust clothing after toileting Toileting Assistive Devices: Grab bar or rail for support Toileting: 5: Supervision: Safety issues/verbal cues  FIM - Diplomatic Services operational officer Devices: Art gallery manager Transfers: 5-To toilet/BSC: Supervision (verbal cues/safety issues)  FIM - Banker Devices: Environmental consultant;Bed rails;Arm rests Bed/Chair Transfer: 5: Supine > Sit: Supervision (verbal cues/safety issues);5: Sit > Supine: Supervision (verbal cues/safety issues);5: Bed > Chair or W/C: Supervision (verbal cues/safety issues);5: Chair or W/C > Bed: Supervision (verbal cues/safety issues)  FIM - Locomotion: Wheelchair Distance: 50 Locomotion: Wheelchair: 2: Travels 50 - 149 ft with minimal assistance (Pt.>75%) FIM - Locomotion: Ambulation Locomotion: Ambulation Assistive Devices: Designer, industrial/product Ambulation/Gait  Assistance: 5: Supervision Locomotion: Ambulation: 5: Travels 150 ft or more with supervision/safety issues  Comprehension Comprehension Mode: Auditory Comprehension: 5-Understands complex 90% of the time/Cues < 10% of the time  Expression Expression Mode: Verbal Expression: 5-Expresses complex 90% of the time/cues < 10% of the time  Social Interaction Social Interaction: 6-Interacts appropriately with others with medication or extra time (anti-anxiety, antidepressant).  Problem Solving Problem Solving: 5-Solves complex 90% of the time/cues < 10% of the time  Memory Memory: 5-Recognizes or recalls 90% of the time/requires cueing < 10% of the time  Medical Problem List and Plan:  1. Deconditioning after pneumonia/mild encephalopathy  2. DVT Prophylaxis/Anticoagulation: Chronic Coumadin therapy for left atrial thrombus. Continue intravenous heparin until INR greater than 2.00. Monitor for any bleeding episodes  3. Neuropsych: This patient Is capable of making decisions on his/her own behalf.  4. Healthcare associated pneumonia. Presently patient continues on Levaquin as well as Tamiflu. Taper Solu-Medrol as directed and confirmed duration of antibiotics. Followup chest x-ray and check oxygen saturations every shift. Plan is for Solu-Medrol times one more day and then prednisone 50 mg by mouth daily x5 days stop  5. Diabetes mellitus. Hemoglobin A1c of 9.1. Monitor closely while on steroids increase Lantus insulin 25 units at bedtime as well as NovoLog 10 units 3 times a day. Check blood sugars a.c. and at bedtime  6. Hypertension/atrial fibrillation. Cardizem 300 mg daily. Tenormin 100 mg twice a day resumed today. Monitor with increased activity  7. Congestive heart failure. Monitor for any signs of fluid overload. Patient on no present diuretics. Patient was on Lasix 40 mg twice a day prior to admission and resumed today  8. Chronic renal insufficiency with baseline creatinine 1. 53.  Latest creatinine 1.33    LOS (Days) 2 A FACE TO FACE EVALUATION WAS PERFORMED  Natilee Gauer E 04/08/2012, 7:08 AM

## 2012-04-09 ENCOUNTER — Inpatient Hospital Stay (HOSPITAL_COMMUNITY): Payer: Medicare Other

## 2012-04-09 ENCOUNTER — Inpatient Hospital Stay (HOSPITAL_COMMUNITY): Payer: Medicare Other | Admitting: *Deleted

## 2012-04-09 DIAGNOSIS — R5381 Other malaise: Secondary | ICD-10-CM

## 2012-04-09 LAB — GLUCOSE, CAPILLARY: Glucose-Capillary: 274 mg/dL — ABNORMAL HIGH (ref 70–99)

## 2012-04-09 MED ORDER — INSULIN ASPART PROT & ASPART (70-30 MIX) 100 UNIT/ML ~~LOC~~ SUSP
20.0000 [IU] | Freq: Two times a day (BID) | SUBCUTANEOUS | Status: DC
  Administered 2012-04-09 – 2012-04-11 (×5): 20 [IU] via SUBCUTANEOUS
  Filled 2012-04-09: qty 3

## 2012-04-09 NOTE — Care Management Note (Signed)
[facility] Individual Statement of Services  Patient Name:  Vincent [name]  Date:  04/09/2012  Welcome to the [facility].  Our goal is to provide you with an individualized program based on your diagnosis and situation, designed to meet your specific needs.  With this comprehensive rehabilitation program, you will be expected to participate in at least 3 hours of rehabilitation therapies Monday-Friday, with modified therapy programming on the weekends.  Your rehabilitation program will include the following services:  Physical Therapy (PT), Occupational Therapy (OT), Speech Therapy (ST), 24 hour per day rehabilitation nursing, Case Management (RN and Social Worker), Rehabilitation Medicine, Nutrition Services and Pharmacy Services  Weekly team conferences will be held on Wednesday to discuss your progress.  Your RN Case Designer, television/film set will talk with you frequently to get your input and to update you on team discussions.  Team conferences with you and your family in attendance may also be held.  Expected length of stay: 5-7 days Overall anticipated outcome: mod/i level  Depending on your progress and recovery, your program may change.  Your RN Case Estate agent will coordinate services and will keep you informed of any changes.  Your RN Sports coach and SW names and contact numbers are listed  below.  The following services may also be recommended but are not provided by the [facility]:   Driving Evaluations  Home Health Rehabiltiation Services  Outpatient Rehabilitatation [facility]  Vocational Rehabilitation   Arrangements will be made to provide these services after discharge if needed.  Arrangements include referral to agencies that provide these services.  Your insurance has been verified to be:  Medicare & Tricare Your primary doctor is:  Dr. Margaretmary Bayley  Pertinent information will be shared with  your doctor and your insurance company.   Social Worker:  Dossie Der, [place] [phone]  Information discussed with and copy given to patient by: Lucy Chris, 04/09/2012, 9:50 AM

## 2012-04-09 NOTE — Progress Notes (Signed)
ANTICOAGULATION CONSULT NOTE - Follow Up Consult  Pharmacy Consult for coumadin Indication: afib and LA thrombus  No Known Allergies  Patient Measurements: Height: 5' 8.9" (175 cm) Weight: 251 lb 1.7 oz (113.9 kg) IBW/kg (Calculated) : 70.47  Heparin Dosing Weight:   Vital Signs: Temp: 97.4 F (36.3 C) (12/26 0512) Temp src: Oral (12/26 0512) BP: 133/71 mmHg (12/26 0512) Pulse Rate: 115  (12/26 0512)  Labs:  Basename 04/09/12 0730 04/08/12 0500 04/07/12 0854  HGB -- -- 11.0*  HCT -- -- 33.7*  PLT -- -- 175  APTT -- -- --  LABPROT 34.3* 34.7* 28.4*  INR 3.67* 3.73* 2.84*  HEPARINUNFRC -- -- --  CREATININE -- -- 1.33  CKTOTAL -- -- --  CKMB -- -- --  TROPONINI -- -- --    Estimated Creatinine Clearance: 60.6 ml/min (by C-G formula based on Cr of 1.33).   Medications:  Scheduled:    . albuterol  2.5 mg Nebulization BID  . allopurinol  150 mg Oral Daily  . antiseptic oral rinse  15 mL Mouth Rinse BID  . atenolol  100 mg Oral BID  . diltiazem  300 mg Oral Daily  . feeding supplement  1 Container Oral TID BM  . furosemide  40 mg Oral BID  . insulin aspart  0-20 Units Subcutaneous TID WC  . insulin aspart  0-5 Units Subcutaneous QHS  . insulin aspart protamine-insulin aspart  20 Units Subcutaneous BID WC  . ipratropium  0.5 mg Nebulization BID  . levofloxacin  750 mg Oral Daily  . potassium chloride  20 mEq Oral BID  . predniSONE  40 mg Oral Q breakfast  . Warfarin - Pharmacist Dosing Inpatient   Does not apply q1800  . [DISCONTINUED] insulin aspart  10 Units Subcutaneous TID WC  . [DISCONTINUED] insulin glargine  25 Units Subcutaneous QHS   Infusions:    Assessment: 76 yo male with afib and LA thrombus is currently on supratherapeutic coumadin.  INR was 3.67. Goal of Therapy:  INR 2-3    Plan:  1) No coumadin today. 2) INR in am.  Bhavik Cabiness, Tsz-Yin 04/09/2012,11:51 AM

## 2012-04-09 NOTE — Progress Notes (Signed)
Occupational Therapy Session Note  Patient Details  Name: Vincent [name] MRN: [id number] Date of Birth: 1936-02-17  Today's Date: 04/09/2012 Time: 0730-0830 Time Calculation (min): 60 min  Short Term Goals: Week 1:  OT Short Term Goal 1 (Week 1): STGs = LTGs  Skilled Therapeutic Interventions/Progress Updates:    Arrived in patient's room with patient visiting with doctor. Breakfast tray had been delivered and patient requested to eat before shower. Patient had pants and pull-ups pulled down to ankles stating that he had gotten hot last night. Pull-ups were soaked with urine. Session with focus on ADL performance, functional transfers, safety during functional mobility when walking in room, and during bathing and dressing. vc's required for hand placement during sit to stands. Patient need min vc's for maneuvering walker in bathroom when getting into shower. Reminder to perform hygiene with peri area when in shower. Patient left in recliner with call light close and a fresh cup of coffee.    Therapy Documentation Precautions:  Precautions Precautions: Fall Precaution Comments: Droplet Precautions Restrictions Weight Bearing Restrictions: No Pain: Pain Assessment Pain Assessment: No/denies pain  See FIM for current functional status  Therapy/Group: Individual Therapy  Limmie Patricia, OTR/L 04/09/2012, 7:44 AM

## 2012-04-09 NOTE — Progress Notes (Signed)
Occupational Therapy Session Note  Patient Details  Name: [name] MRN: [id number] Date of Birth: Jan 25, 1936  Today's Date: 04/09/2012 Time: 2841-3244 Time Calculation (min): 45 min  Short Term Goals: Week 1:  OT Short Term Goal 1 (Week 1): STGs = LTGs  Skilled Therapeutic Interventions/Progress Updates:   Upon arrival, pt sitting in recliner with gown on. Therapist asked if he wanted to donn pants, pt stated he had an accident and had no clean pants. Therapist retrieved new gowns for pt as they were wet as well. Pt required min A only to tie back of gown. Pt's wife is doing Pharmacologist.Pt used RW into bathroom and completed toileting with SBA for safety.  UE exercises to increase activity tolerance and strength included: with medium weighted blue ball, bil sh flexion over head, chest press x10, 3 sets. With yellow theraband, row exercise, sh flexion/extension (extension in standing) x10, 3 sets. With 5# weight, bicep curls (in standing,) x10, 3 sets. 3# weight sh ABD in standing x10, 2 sets. Pt fatigued after standing exercises, requiring rest breaks and recovering within a minute. Intermittent verbal cueing required for posture and pacing during exercises.   Therapy Documentation Precautions:  Precautions Precautions: Fall Precaution Comments: Droplet Precautions  Restrictions Weight Bearing Restrictions: No General:   Vital Signs:   Pain: Pt reports no pain.         See FIM for current functional status  Therapy/Group: Individual Therapy  [name] Hessie Diener 04/09/2012, 2:28 PM

## 2012-04-09 NOTE — Progress Notes (Signed)
Patient ID: Vincent [name], male   DOB: [birth year], 76 y.o.   MRN: [id number]  Subjective/Complaints: 76 y.o. right-handed male with history of diabetes mellitus, congestive heart failure, atrial fibrillation as well as left atrial thrombus with chronic Coumadin and chronic renal insufficiency with baseline creatinine 1. 53. Patient with recent admission 9/ 2013 for coagulase-negative staph bacteremia a TEE negative. Admitted 03/31/2012 with nausea, vomiting and new cough. Patient recently discharged from the hospital in September for cardiac stenting as well as bacteremia. Noted low-grade fever 100.3. Chest x-ray with chronic findings questionable pneumonia.. Placed on broad-spectrum antibiotics for suspect healthcare associated pneumonia and presently maintained on Levaquin as well as Tamiflu. Blood cultures and sputum cultures negative  Afebrile No breathing problems  Review of Systems  Constitutional: Negative for fever.  Respiratory: Negative for shortness of breath and wheezing.   Cardiovascular: Negative for chest pain and orthopnea.  All other systems reviewed and are negative.    Objective: Vital Signs: Blood pressure 133/71, pulse 115, temperature 97.4 F (36.3 C), temperature source Oral, resp. rate 17, height 5' 8.9" (1.75 m), weight 113.9 kg (251 lb 1.7 oz), SpO2 94.00%. No results found. Results for orders placed during the hospital encounter of 04/06/12 (from the past 72 hour(s))  GLUCOSE, CAPILLARY     Status: Abnormal   Collection Time   04/06/12  4:46 PM      Component Value Range Comment   Glucose-Capillary 440 (*) 70 - 99 mg/dL    Comment 1 Notify RN     GLUCOSE, CAPILLARY     Status: Abnormal   Collection Time   04/06/12  8:59 PM      Component Value Range Comment   Glucose-Capillary 345 (*) 70 - 99 mg/dL    Comment 1 Notify RN     GLUCOSE, CAPILLARY     Status: Abnormal   Collection Time   04/07/12  7:50 AM      Component Value Range Comment   Glucose-Capillary 329 (*) 70 - 99 mg/dL    Comment 1 Notify RN     PROTIME-INR     Status: Abnormal   Collection Time   04/07/12  8:54 AM      Component Value Range Comment   Prothrombin Time 28.4 (*) 11.6 - 15.2 seconds    INR 2.84 (*) 0.00 - 1.49   CBC WITH DIFFERENTIAL     Status: Abnormal   Collection Time   04/07/12  8:54 AM      Component Value Range Comment   WBC 11.4 (*) 4.0 - 10.5 K/uL    RBC 3.70 (*) 4.22 - 5.81 MIL/uL    Hemoglobin 11.0 (*) 13.0 - 17.0 g/dL    HCT 40.9 (*) 81.1 - 52.0 %    MCV 91.1  78.0 - 100.0 fL    MCH 29.7  26.0 - 34.0 pg    MCHC 32.6  30.0 - 36.0 g/dL    RDW 91.4 (*) 78.2 - 15.5 %    Platelets 175  150 - 400 K/uL    Neutrophils Relative 90 (*) 43 - 77 %    Lymphocytes Relative 7 (*) 12 - 46 %    Monocytes Relative 3  3 - 12 %    Eosinophils Relative 0  0 - 5 %    Basophils Relative 0  0 - 1 %    Neutro Abs 10.3 (*) 1.7 - 7.7 K/uL    Lymphs Abs 0.8  0.7 - 4.0 K/uL  Monocytes Absolute 0.3  0.1 - 1.0 K/uL    Eosinophils Absolute 0.0  0.0 - 0.7 K/uL    Basophils Absolute 0.0  0.0 - 0.1 K/uL    RBC Morphology POLYCHROMASIA PRESENT     COMPREHENSIVE METABOLIC PANEL     Status: Abnormal   Collection Time   04/07/12  8:54 AM      Component Value Range Comment   Sodium 134 (*) 135 - 145 mEq/L    Potassium 4.4  3.5 - 5.1 mEq/L    Chloride 98  96 - 112 mEq/L    CO2 23  19 - 32 mEq/L    Glucose, Bld 429 (*) 70 - 99 mg/dL    BUN 33 (*) 6 - 23 mg/dL    Creatinine, Ser 4.54  0.50 - 1.35 mg/dL    Calcium 8.9  8.4 - 09.8 mg/dL    Total Protein 7.5  6.0 - 8.3 g/dL    Albumin 3.0 (*) 3.5 - 5.2 g/dL    AST 38 (*) 0 - 37 U/L    ALT 47  0 - 53 U/L    Alkaline Phosphatase 132 (*) 39 - 117 U/L    Total Bilirubin 0.5  0.3 - 1.2 mg/dL    GFR calc non Af Amer 51 (*) >90 mL/min    GFR calc Af Amer 59 (*) >90 mL/min   GLUCOSE, CAPILLARY     Status: Abnormal   Collection Time   04/07/12 11:17 AM      Component Value Range Comment   Glucose-Capillary 382  (*) 70 - 99 mg/dL    Comment 1 Notify RN     GLUCOSE, CAPILLARY     Status: Abnormal   Collection Time   04/07/12  4:07 PM      Component Value Range Comment   Glucose-Capillary 244 (*) 70 - 99 mg/dL   GLUCOSE, CAPILLARY     Status: Abnormal   Collection Time   04/07/12  9:34 PM      Component Value Range Comment   Glucose-Capillary 195 (*) 70 - 99 mg/dL    Comment 1 Notify RN     PROTIME-INR     Status: Abnormal   Collection Time   04/08/12  5:00 AM      Component Value Range Comment   Prothrombin Time 34.7 (*) 11.6 - 15.2 seconds    INR 3.73 (*) 0.00 - 1.49   GLUCOSE, CAPILLARY     Status: Abnormal   Collection Time   04/08/12  7:26 AM      Component Value Range Comment   Glucose-Capillary 217 (*) 70 - 99 mg/dL    Comment 1 Notify RN     GLUCOSE, CAPILLARY     Status: Abnormal   Collection Time   04/08/12 11:18 AM      Component Value Range Comment   Glucose-Capillary 205 (*) 70 - 99 mg/dL    Comment 1 Notify RN     GLUCOSE, CAPILLARY     Status: Abnormal   Collection Time   04/08/12  4:13 PM      Component Value Range Comment   Glucose-Capillary 261 (*) 70 - 99 mg/dL    Comment 1 Notify RN     GLUCOSE, CAPILLARY     Status: Abnormal   Collection Time   04/08/12  9:29 PM      Component Value Range Comment   Glucose-Capillary 221 (*) 70 - 99 mg/dL    Comment 1 Notify RN  General: Alert and oriented x 2-3, No apparent distress.  HEENT: Head is normocephalic, atraumatic, PERRLA, EOMI, sclera anicteric, oral mucosa pink and moist, dentition intact, ext ear canals clear,  Neck: Supple without JVD or lymphadenopathy  Heart: Reg rate and rhythm. No murmurs rubs or gallops  Chest: no wheezes, decreased breath sounds at bases, no rhonchi; no distress  Abdomen: Soft, non-tender, non-distended, bowel sounds positive.  Extremities: No clubbing, cyanosis; 1+ lower ext edema. Pulses are 2+  Skin: Clean and intact without signs of breakdown  Neuro: Pt is cognitively  appropriate with fair insight.. Cranial nerves 2-12 are intact. Sensory exam is normal. Reflexes are 2+ in all 4's. Fine motor coordination is intact.Motor function is grossly 4/5 UE. LE 4- proximally, 4/5 distally. Mild sensory loss distally in stocking glove distribution over both legs.  Musculoskeletal: . Posture appropriate  Psych: Pt's affect is appropriate. Pt is cooperative   Assessment/Plan: 1. Functional deficits secondary to deconditioning after pneumonia which require 3+ hours per day of interdisciplinary therapy in a comprehensive inpatient rehab setting. Physiatrist is providing close team supervision and 24 hour management of active medical problems listed below. Physiatrist and rehab team continue to assess barriers to discharge/monitor patient progress toward functional and medical goals. FIM: FIM - Bathing Bathing Steps Patient Completed: Chest;Right Arm;Left Arm;Abdomen;Front perineal area;Buttocks;Right upper leg;Left upper leg Bathing: 4: Min-Patient completes 8-9 25f 10 parts or 75+ percent  FIM - Upper Body Dressing/Undressing Upper body dressing/undressing steps patient completed: Thread/unthread right sleeve of pullover shirt/dresss;Thread/unthread left sleeve of pullover shirt/dress;Put head through opening of pull over shirt/dress;Pull shirt over trunk Upper body dressing/undressing: 5: Set-up assist to: Obtain clothing/put away FIM - Lower Body Dressing/Undressing Lower body dressing/undressing steps patient completed: Thread/unthread right pants leg;Thread/unthread left pants leg;Pull pants up/down;Don/Doff right sock;Don/Doff left sock Lower body dressing/undressing: 5: Set-up assist to: Obtain clothing  FIM - Toileting Toileting steps completed by patient: Adjust clothing prior to toileting;Performs perineal hygiene;Adjust clothing after toileting Toileting Assistive Devices: Grab bar or rail for support Toileting: 5: Supervision: Safety issues/verbal cues  FIM  - Diplomatic Services operational officer Devices: Best boy Transfers: 5-To toilet/BSC: Supervision (verbal cues/safety issues);5-From toilet/BSC: Supervision (verbal cues/safety issues)  FIM - Banker Devices: Walker;Bed rails;Arm rests Bed/Chair Transfer: 5: Supine > Sit: Supervision (verbal cues/safety issues);5: Sit > Supine: Supervision (verbal cues/safety issues);5: Chair or W/C > Bed: Supervision (verbal cues/safety issues);5: Bed > Chair or W/C: Supervision (verbal cues/safety issues)  FIM - Locomotion: Wheelchair Distance: 50 Locomotion: Wheelchair: 2: Travels 50 - 149 ft with minimal assistance (Pt.>75%) FIM - Locomotion: Ambulation Locomotion: Ambulation Assistive Devices: Designer, industrial/product Ambulation/Gait Assistance: 5: Supervision Locomotion: Ambulation: 5: Travels 150 ft or more with supervision/safety issues  Comprehension Comprehension Mode: Auditory Comprehension: 5-Understands complex 90% of the time/Cues < 10% of the time  Expression Expression Mode: Verbal Expression: 5-Expresses complex 90% of the time/cues < 10% of the time  Social Interaction Social Interaction: 6-Interacts appropriately with others with medication or extra time (anti-anxiety, antidepressant).  Problem Solving Problem Solving: 5-Solves complex 90% of the time/cues < 10% of the time  Memory Memory: 5-Recognizes or recalls 90% of the time/requires cueing < 10% of the time  Medical Problem List and Plan:  1. Deconditioning after pneumonia/mild encephalopathy  2. DVT Prophylaxis/Anticoagulation: Chronic Coumadin therapy for left atrial thrombus. Continue intravenous heparin until INR greater than 2.00. Monitor for any bleeding episodes  3. Neuropsych: This patient Is capable of making decisions on his/her own  behalf.  4. Healthcare associated pneumonia. Presently patient continues on Levaquin as well as Tamiflu. Taper Solu-Medrol as  directed and confirmed duration of antibiotics. Followup chest x-ray and check oxygen saturations every shift. 5. Diabetes mellitus. Hemoglobin A1c of 9.1. Monitor closely while on steroids increase Will resume 70/30 insulin (home  Med)  Will be off steroids at discharge on 12/28 Check blood sugars a.c. and at bedtime  6. Hypertension/atrial fibrillation. Cardizem 300 mg daily. Tenormin 100 mg twice a day resumed today. Monitor with increased activity  7. Congestive heart failure. Monitor for any signs of fluid overload. Patient on no present diuretics. Patient was on Lasix 40 mg twice a day prior to admission and resumed today  8. Chronic renal insufficiency with baseline creatinine 1. 53. Latest creatinine 1.33    LOS (Days) 3 A FACE TO FACE EVALUATION WAS PERFORMED  Mekesha Solomon E 04/09/2012, 7:29 AM

## 2012-04-09 NOTE — Progress Notes (Signed)
Patient evaluated for community based chronic disease management services with [facility] Care Management Program as a benefit of patient's [employer].  Visited with patient at bedside to explain [facility] Care Management services, however he was starting a session with skilled therapy.  Left contact information and [facility] literature at bedside with a request for he and his wife to review.  Will follow up later to answer any questions they may have. Made CIR MSW aware that [facility] Care Management following. Of note, [facility] Care Management services does not replace or interfere with any services that are arranged by inpatient case management or social work.  For additional questions or referrals please contact Anibal Henderson BSN RN [facility] Liaison at [phone].

## 2012-04-09 NOTE — Progress Notes (Signed)
Social Work Assessment and Plan Social Work Assessment and Plan  Patient Details  Name: Vincent [name] MRN: [id number] Date of Birth: 25-Dec-1935  Today's Date: 04/09/2012  Problem List:  Patient Active Problem List  Diagnosis  . Hypertension  . Irregular heartbeat  . Prostate cancer  . Acute on chronic diastolic CHF (congestive heart failure)  . Atrial fibrillation with rapid ventricular response, history of PAF was in SR in 08/2011  . DM2 (diabetes mellitus, type 2)  . Leukocytosis  . Bacteremia  . CKD (chronic kidney disease) stage 3, GFR 30-59 ml/min  . LA thrombus  . Nausea and vomiting  . Healthcare-associated pneumonia  . Multifocal atrial tachycardia  . Chronic systolic heart failure  . UTI (lower urinary tract infection)  . Normocytic anemia  . PNA (pneumonia)   Past Medical History:  Past Medical History  Diagnosis Date  . Diabetes mellitus   . Hypertension   . CHF (congestive heart failure)   . Cancer   . Pneumonia   . Irregular heartbeat   . Prostate cancer   . Shortness of breath   . GERD (gastroesophageal reflux disease)   . Acute on chronic diastolic CHF (congestive heart failure) 12/18/2011  . Atrial fibrillation with rapid ventricular response, history of PAF was in SR in 08/2011 12/18/2011  . CKD (chronic kidney disease) stage 3, GFR 30-59 ml/min 12/26/2011   Past Surgical History:  Past Surgical History  Procedure Date  . Prostate surgery   . Tee without cardioversion 12/27/2011    Procedure: TRANSESOPHAGEAL ECHOCARDIOGRAM (TEE);  Surgeon: Chrystie Nose, MD;  Location: [facility] OR;  Service: Cardiovascular;  Laterality: N/A;  MD request Main OR  . Cardioversion 12/27/2011    Procedure: CARDIOVERSION;  Surgeon: Chrystie Nose, MD;  Location: [facility] OR;  Service: Cardiovascular;  Laterality: N/A;   Social History:  reports that he has never smoked. He has never used smokeless tobacco. He reports that he does not drink alcohol or use illicit drugs.  Family /  Support Systems Marital Status: Married Patient Roles: Spouse;Parent Spouse/Significant Other: June-wife  [phone]-home  [phone]-cell Children: Delilah Shan  [phone]-home  [phone]-cell Other Supports: Two other children local Anticipated Caregiver: Wife and Pt Ability/Limitations of Caregiver: Wife is w/c bound and a HD pt, pt needs to be mod/i before going home Caregiver Availability: Intermittent Family Dynamics: Close knit family who will do for one another.  Pt reprots they have three grandchildren whom live with htem so it is a housefull.    Social History Preferred language: English Religion: Episcopalian Cultural Background: No issues Education: Automotive engineer Educated Read: Yes Write: Yes Employment Status: Retired Fish farm manager Issues: No issues Guardian/Conservator: None-according to MD pt is capable of making his own decisions   Abuse/Neglect Physical Abuse: Denies Verbal Abuse: Denies Sexual Abuse: Denies Exploitation of patient/patient's resources: Denies Self-Neglect: Denies  Emotional Status Pt's affect, behavior adn adjustment status: Pt is motivated to [employer] and return home.  He feels he has made good progress already and is close to going home.  He will do what the team asks and will work hard here. Recent Psychosocial Issues: Other medical issues Pyschiatric History: No history-deferred depression screen due to pt feels doing wll and not needed.  Will monitor while here. Substance Abuse History: No issues  Patient / Family Perceptions, Expectations & Goals Pt/Family understanding of illness & functional limitations: Pt and wife can explain his condition and issues.  Both are encouraged by the progress he has made thus  far.  Hopeful this will continue after discharge.  Pt feels therapy is really helping him regain his strength. Premorbid pt/family roles/activities: Husband, Father, grandfather, Retiree, Home owner, Church member,  etc Anticipated changes in roles/activities/participation: resume Pt/family expectations/goals: Pt states: " I want to be able to take care of myself, my wife has her own health issues."  Wife wants him to be able to care for himself.  She states: " I am hopeful he can move around by himself."  [employer]: None Premorbid Home Care/DME Agencies: Other (Comment) ([facility]-followed PTA) Transportation available at discharge: Family Resource referrals recommended: Support group (specify)  Discharge Planning Living Arrangements: Spouse/significant other;Other relatives Support Systems: Spouse/significant other;Children;Other relatives;Friends/neighbors;Church/faith community Type of Residence: Private residence Insurance Resources: Administrator (specify) Biomedical engineer) Financial Resources: Restaurant manager, fast food Screen Referred: No Living Expenses: Lives with family Money Management: Patient;Spouse Do you have any problems obtaining your medications?: No Home Management: Family Patient/Family Preliminary Plans: Return home with wife and grandchildren, needs to be mod/i level to retrun home.  Wife is not able ot physically assist him. Social Work Anticipated Follow Up Needs: HH/OP DC Planning Additional Notes/Comments: Short length of stay high level  Clinical Impression Pleasant gentleman who is motivated and moving well.  Discharge date set for 12/28.  Will resume HH-[facility] and have wife observe in therapies to assure her his is at mod/i level.  Lucy Chris 04/09/2012, 9:48 AM

## 2012-04-09 NOTE — Progress Notes (Signed)
Physical Therapy Session Note  Patient Details  Name: Vincent [name] MRN: [id number] Date of Birth: [birth year]  Today's Date: 04/09/2012 Time: 0930-1030 and 1308-6578 Time Calculation (min): 60 min and 30 min  Short Term Goals: Week 1:  PT Short Term Goal 1 (Week 1): STGs= LTGs secondary to short LOS  Skilled Therapeutic Interventions/Progress Updates:    AM Session: Patient received seated in recliner. Today's session focused on increasing activity tolerance with functional mobility. See details below. Patient performed car transfer with rolling walker and supervision and was able to open/close car door with rolling walker and supervision and maintain balance. Discussion with patient about energy conservation techniques and monitoring fatigue and SOB levels. Patient verbalized understanding.  Droplet precautions maintained throughout session.  PM Session: Patient received sitting in recliner with gown on. Patient with requests to put pants on. Patient dons pants sitting in recliner and requires supervision upon standing without AD to pull pants up. Patient performed gait training 186' x2 with rolling walker and supervision room<>gym. Patient educated on falls risk at home and indications for calling 911 vs. performing floor transfer. Patient performed floor transfer: standing>half kneeling>tall kneeling>side sitting with L UE support>L sidelying>side sitting with L UE support>quadruped>tall kneeling>half kneeling>standing (using B UE support on mat when in tall and half kneeling). Patient requires supervision for verbal cues for proper sequencing and technique. Patient requires rest breaks throughout floor transfer secondary to increased SOB.  Patient returned to room and left seated in recliner with all needs within reach. Patient encouraged to call for assistance.  Droplet precautions maintained throughout session.   Therapy Documentation Precautions:  Precautions Precautions:  Fall Precaution Comments: Droplet Precautions  Restrictions Weight Bearing Restrictions: No Pain: Pain Assessment Pain Assessment: No/denies pain Pain Score: 0-No pain Mobility: Transfers Sit to Stand: 5: Supervision;With armrests;From chair/3-in-1;With upper extremity assist Sit to Stand Details: Verbal cues for precautions/safety Stand to Sit: 5: Supervision;With upper extremity assist;To bed;To chair/3-in-1;With armrests Stand to Sit Details (indicate cue type and reason): Verbal cues for precautions/safety Locomotion : Ambulation Ambulation: Yes Ambulation/Gait Assistance: 5: Supervision Ambulation Distance (Feet): 186 Feet x4, 155' x2 Assistive device: Rolling walker Ambulation/Gait Assistance Details: Verbal cues for precautions/safety Gait Gait: Yes Gait Pattern: Step-through pattern;Decreased stride length Stairs / Additional Locomotion Stairs Assistance: 5: Supervision Stairs Assistance Details: Verbal cues for precautions/safety;Verbal cues for gait pattern Stair Management Technique: Two rails;Forwards;Step to pattern Number of Stairs: 8  Height of Stairs: 6  Ramp: 5: Supervision;Other (comment) (with RW) Curb: 5: Supervision;Other (comment) (with RW) Wheelchair Mobility Wheelchair Mobility: No   See FIM for current functional status  Therapy/Group: Individual Therapy  Chipper Herb. Anand Tejada, PT, DPT  04/09/2012, 10:30 AM

## 2012-04-10 ENCOUNTER — Inpatient Hospital Stay (HOSPITAL_COMMUNITY): Payer: Medicare Other | Admitting: *Deleted

## 2012-04-10 ENCOUNTER — Inpatient Hospital Stay (HOSPITAL_COMMUNITY): Payer: Medicare Other

## 2012-04-10 DIAGNOSIS — R5381 Other malaise: Secondary | ICD-10-CM

## 2012-04-10 LAB — GLUCOSE, CAPILLARY
Glucose-Capillary: 183 mg/dL — ABNORMAL HIGH (ref 70–99)
Glucose-Capillary: 175 mg/dL — ABNORMAL HIGH (ref 70–99)

## 2012-04-10 LAB — URINALYSIS, ROUTINE W REFLEX MICROSCOPIC
Hgb urine dipstick: NEGATIVE
Leukocytes, UA: NEGATIVE
Nitrite: NEGATIVE
Specific Gravity, Urine: 1.014 (ref 1.005–1.030)
Urobilinogen, UA: 0.2 mg/dL (ref 0.0–1.0)

## 2012-04-10 LAB — PROTIME-INR: Prothrombin Time: 30.4 seconds — ABNORMAL HIGH (ref 11.6–15.2)

## 2012-04-10 MED ORDER — WARFARIN SODIUM 4 MG PO TABS: 4.0000 mg | ORAL_TABLET | Freq: Every morning | ORAL | Status: DC

## 2012-04-10 MED ORDER — INSULIN ASPART PROT & ASPART (70-30 MIX) 100 UNIT/ML ~~LOC~~ SUSP: 20.0000 [IU] | Freq: Two times a day (BID) | SUBCUTANEOUS | Status: DC

## 2012-04-10 MED ORDER — ALLOPURINOL 100 MG PO TABS: 150.0000 mg | ORAL_TABLET | Freq: Every morning | ORAL | Status: DC

## 2012-04-10 MED ORDER — FUROSEMIDE 80 MG PO TABS: 40.0000 mg | ORAL_TABLET | Freq: Two times a day (BID) | ORAL | Status: DC

## 2012-04-10 MED ORDER — DILTIAZEM HCL ER BEADS 300 MG PO CP24: 300.0000 mg | ORAL_CAPSULE | Freq: Every morning | ORAL | Status: DC

## 2012-04-10 MED ORDER — POTASSIUM CHLORIDE CRYS ER 10 MEQ PO TBCR: 20.0000 meq | EXTENDED_RELEASE_TABLET | Freq: Two times a day (BID) | ORAL | Status: DC

## 2012-04-10 MED ORDER — ATENOLOL 100 MG PO TABS: 100.0000 mg | ORAL_TABLET | Freq: Two times a day (BID) | ORAL | Status: DC

## 2012-04-10 MED ORDER — WARFARIN SODIUM 4 MG PO TABS
4.0000 mg | ORAL_TABLET | Freq: Once | ORAL | Status: AC
  Administered 2012-04-10: 4 mg via ORAL
  Filled 2012-04-10: qty 1

## 2012-04-10 NOTE — Progress Notes (Signed)
Social Work Discharge Note Discharge Note  The overall goal for the admission was met for:   Discharge location: Yes-HOME WITH WIFE WHO CAN PROVIDE SUPERVISION LEVEL  Length of Stay: Yes-5 DAYS  Discharge activity level: Yes-MOD/I LEVEL  Home/community participation: Yes  Services provided included: MD, RD, PT, OT, RN, Pharmacy and SW  Financial Services: Medicare and Other: TRICARE  Follow-up services arranged: Home Health: [facility]-PT, OT, RN and Patient/Family request agency HH: [facility], DME: PREF [facility]  Comments (or additional information):REACHED GOALS QUICKLY-THN-TIM TO FOLLOW UP AT DISCHARGE  Patient/Family verbalized understanding of follow-up arrangements: Yes  Individual responsible for coordination of the follow-up plan: SELF & June-WIFE  Confirmed correct DME delivered: Lucy Chris 04/10/2012    Lucy Chris

## 2012-04-10 NOTE — Progress Notes (Signed)
Spoke with patient about his desire to receive [facility] services.  Patient declines any services at this time but was very appreciative of the information.  For additional questions or referrals please contact Anibal Henderson BSN RN [facility] Liaison at [phone].

## 2012-04-10 NOTE — Progress Notes (Signed)
Patient ID: Vincent [name], male   DOB: 23-Apr-1935, 76 y.o.   MRN: [id number]  Subjective/Complaints: 76 y.o. right-handed male with history of diabetes mellitus, congestive heart failure, atrial fibrillation as well as left atrial thrombus with chronic Coumadin and chronic renal insufficiency with baseline creatinine 1. 53. Patient with recent admission 9/ 2013 for coagulase-negative staph bacteremia a TEE negative. Admitted 03/31/2012 with nausea, vomiting and new cough. Patient recently discharged from the hospital in September for cardiac stenting as well as bacteremia. Noted low-grade fever 100.3. Chest x-ray with chronic findings questionable pneumonia.. Placed on broad-spectrum antibiotics for suspect healthcare associated pneumonia and presently maintained on Levaquin as well as Tamiflu. Blood cultures and sputum cultures negative  Afebrile No breathing problems Poor bladder control , no dysuria Review of Systems  Constitutional: Negative for fever.  Respiratory: Negative for shortness of breath and wheezing.   Cardiovascular: Negative for chest pain and orthopnea.  All other systems reviewed and are negative.    Objective: Vital Signs: Blood pressure 145/93, pulse 83, temperature 97.5 F (36.4 C), temperature source Oral, resp. rate 17, height 5' 8.9" (1.75 m), weight 111.2 kg (245 lb 2.4 oz), SpO2 91.00%. Dg Chest 2 View  04/09/2012  *RADIOLOGY REPORT*  Clinical Data:  Pneumonia, follow-up, finishing antibiotics  CHEST - 2 VIEW  Comparison: 04/03/2012  Findings: Upper normal heart size. Atherosclerotic calcification aorta. Mediastinal contours and pulmonary vascularity normal. Bronchitic changes with decrease in perihilar markings versus previous study. Calcified granuloma right upper lobe. No acute infiltrate, pleural effusion or pneumothorax. Multilevel endplate spur formation thoracic spine.  IMPRESSION: Bronchitic changes with decreased accentuation perihilar markings versus previous  study suggesting improved infiltrate.   Original Report Authenticated By: Ulyses Southward, M.D.    Results for orders placed during the hospital encounter of 04/06/12 (from the past 72 hour(s))  PROTIME-INR     Status: Abnormal   Collection Time   04/07/12  8:54 AM      Component Value Range Comment   Prothrombin Time 28.4 (*) 11.6 - 15.2 seconds    INR 2.84 (*) 0.00 - 1.49   CBC WITH DIFFERENTIAL     Status: Abnormal   Collection Time   04/07/12  8:54 AM      Component Value Range Comment   WBC 11.4 (*) 4.0 - 10.5 K/uL    RBC 3.70 (*) 4.22 - 5.81 MIL/uL    Hemoglobin 11.0 (*) 13.0 - 17.0 g/dL    HCT 40.9 (*) 81.1 - 52.0 %    MCV 91.1  78.0 - 100.0 fL    MCH 29.7  26.0 - 34.0 pg    MCHC 32.6  30.0 - 36.0 g/dL    RDW 91.4 (*) 78.2 - 15.5 %    Platelets 175  150 - 400 K/uL    Neutrophils Relative 90 (*) 43 - 77 %    Lymphocytes Relative 7 (*) 12 - 46 %    Monocytes Relative 3  3 - 12 %    Eosinophils Relative 0  0 - 5 %    Basophils Relative 0  0 - 1 %    Neutro Abs 10.3 (*) 1.7 - 7.7 K/uL    Lymphs Abs 0.8  0.7 - 4.0 K/uL    Monocytes Absolute 0.3  0.1 - 1.0 K/uL    Eosinophils Absolute 0.0  0.0 - 0.7 K/uL    Basophils Absolute 0.0  0.0 - 0.1 K/uL    RBC Morphology POLYCHROMASIA PRESENT     COMPREHENSIVE  METABOLIC PANEL     Status: Abnormal   Collection Time   04/07/12  8:54 AM      Component Value Range Comment   Sodium 134 (*) 135 - 145 mEq/L    Potassium 4.4  3.5 - 5.1 mEq/L    Chloride 98  96 - 112 mEq/L    CO2 23  19 - 32 mEq/L    Glucose, Bld 429 (*) 70 - 99 mg/dL    BUN 33 (*) 6 - 23 mg/dL    Creatinine, Ser 1.61  0.50 - 1.35 mg/dL    Calcium 8.9  8.4 - 09.6 mg/dL    Total Protein 7.5  6.0 - 8.3 g/dL    Albumin 3.0 (*) 3.5 - 5.2 g/dL    AST 38 (*) 0 - 37 U/L    ALT 47  0 - 53 U/L    Alkaline Phosphatase 132 (*) 39 - 117 U/L    Total Bilirubin 0.5  0.3 - 1.2 mg/dL    GFR calc non Af Amer 51 (*) >90 mL/min    GFR calc Af Amer 59 (*) >90 mL/min   GLUCOSE, CAPILLARY      Status: Abnormal   Collection Time   04/07/12 11:17 AM      Component Value Range Comment   Glucose-Capillary 382 (*) 70 - 99 mg/dL    Comment 1 Notify RN     GLUCOSE, CAPILLARY     Status: Abnormal   Collection Time   04/07/12  4:07 PM      Component Value Range Comment   Glucose-Capillary 244 (*) 70 - 99 mg/dL   GLUCOSE, CAPILLARY     Status: Abnormal   Collection Time   04/07/12  9:34 PM      Component Value Range Comment   Glucose-Capillary 195 (*) 70 - 99 mg/dL    Comment 1 Notify RN     PROTIME-INR     Status: Abnormal   Collection Time   04/08/12  5:00 AM      Component Value Range Comment   Prothrombin Time 34.7 (*) 11.6 - 15.2 seconds    INR 3.73 (*) 0.00 - 1.49   GLUCOSE, CAPILLARY     Status: Abnormal   Collection Time   04/08/12  7:26 AM      Component Value Range Comment   Glucose-Capillary 217 (*) 70 - 99 mg/dL    Comment 1 Notify RN     GLUCOSE, CAPILLARY     Status: Abnormal   Collection Time   04/08/12 11:18 AM      Component Value Range Comment   Glucose-Capillary 205 (*) 70 - 99 mg/dL    Comment 1 Notify RN     GLUCOSE, CAPILLARY     Status: Abnormal   Collection Time   04/08/12  4:13 PM      Component Value Range Comment   Glucose-Capillary 261 (*) 70 - 99 mg/dL    Comment 1 Notify RN     GLUCOSE, CAPILLARY     Status: Abnormal   Collection Time   04/08/12  9:29 PM      Component Value Range Comment   Glucose-Capillary 221 (*) 70 - 99 mg/dL    Comment 1 Notify RN     PROTIME-INR     Status: Abnormal   Collection Time   04/09/12  7:30 AM      Component Value Range Comment   Prothrombin Time 34.3 (*) 11.6 - 15.2 seconds  INR 3.67 (*) 0.00 - 1.49   GLUCOSE, CAPILLARY     Status: Abnormal   Collection Time   04/09/12  9:23 AM      Component Value Range Comment   Glucose-Capillary 274 (*) 70 - 99 mg/dL    Comment 1 Notify RN     GLUCOSE, CAPILLARY     Status: Abnormal   Collection Time   04/09/12 12:00 PM      Component Value Range  Comment   Glucose-Capillary 267 (*) 70 - 99 mg/dL    Comment 1 Notify RN     GLUCOSE, CAPILLARY     Status: Abnormal   Collection Time   04/09/12  5:00 PM      Component Value Range Comment   Glucose-Capillary 173 (*) 70 - 99 mg/dL   GLUCOSE, CAPILLARY     Status: Abnormal   Collection Time   04/09/12  9:05 PM      Component Value Range Comment   Glucose-Capillary 228 (*) 70 - 99 mg/dL   GLUCOSE, CAPILLARY     Status: Abnormal   Collection Time   04/10/12  7:22 AM      Component Value Range Comment   Glucose-Capillary 183 (*) 70 - 99 mg/dL    Comment 1 Notify RN         General: Alert and oriented x 2-3, No apparent distress.  HEENT: Head is normocephalic, atraumatic, PERRLA, EOMI, sclera anicteric, oral mucosa pink and moist, dentition intact, ext ear canals clear,  Neck: Supple without JVD or lymphadenopathy  Heart: Reg rate and rhythm. No murmurs rubs or gallops  Chest: no wheezes, decreased breath sounds at bases, no rhonchi; no distress  Abdomen: Soft, non-tender, non-distended, bowel sounds positive.  Extremities: No clubbing, cyanosis; 1+ lower ext edema. Pulses are 2+  Skin: Clean and intact without signs of breakdown  Neuro: Pt is cognitively appropriate with fair insight.. Cranial nerves 2-12 are intact. Sensory exam is normal. Reflexes are 2+ in all 4's. Fine motor coordination is intact.Motor function is grossly 4/5 UE. LE 4- proximally, 4/5 distally. Mild sensory loss distally in stocking glove distribution over both legs.  Musculoskeletal: . Posture appropriate  Psych: Pt's affect is appropriate. Pt is cooperative   Assessment/Plan: 1. Functional deficits secondary to deconditioning after pneumonia which require 3+ hours per day of interdisciplinary therapy in a comprehensive inpatient rehab setting. Physiatrist is providing close team supervision and 24 hour management of active medical problems listed below. Physiatrist and rehab team continue to assess barriers  to discharge/monitor patient progress toward functional and medical goals. FIM: FIM - Bathing Bathing Steps Patient Completed: Chest;Right Arm;Left Arm;Abdomen;Front perineal area;Buttocks;Right upper leg;Left upper leg;Right lower leg (including foot);Left lower leg (including foot) Bathing: 5: Supervision: Safety issues/verbal cues  FIM - Upper Body Dressing/Undressing Upper body dressing/undressing steps patient completed: Thread/unthread right sleeve of pullover shirt/dresss;Thread/unthread left sleeve of pullover shirt/dress;Put head through opening of pull over shirt/dress;Pull shirt over trunk Upper body dressing/undressing: 5: Set-up assist to: Obtain clothing/put away FIM - Lower Body Dressing/Undressing Lower body dressing/undressing steps patient completed: Thread/unthread right pants leg;Thread/unthread left pants leg;Pull pants up/down;Don/Doff right sock;Don/Doff left sock Lower body dressing/undressing: 5: Set-up assist to: Obtain clothing  FIM - Toileting Toileting steps completed by patient: Adjust clothing prior to toileting;Performs perineal hygiene;Adjust clothing after toileting Toileting Assistive Devices: Grab bar or rail for support Toileting: 5: Supervision: Safety issues/verbal cues  FIM - Diplomatic Services operational officer Devices: Grab bars;Walker Toilet Transfers: 5-To toilet/BSC: Supervision (  verbal cues/safety issues);5-From toilet/BSC: Supervision (verbal cues/safety issues)  FIM - Press photographer Assistive Devices: Arm rests Bed/Chair Transfer: 5: Chair or W/C > Bed: Supervision (verbal cues/safety issues);5: Bed > Chair or W/C: Supervision (verbal cues/safety issues)  FIM - Locomotion: Wheelchair Distance: 0 Locomotion: Wheelchair: 0: Activity did not occur FIM - Locomotion: Ambulation Locomotion: Ambulation Assistive Devices: Designer, industrial/product Ambulation/Gait Assistance: 5: Supervision Locomotion: Ambulation: 5: Travels  150 ft or more with supervision/safety issues  Comprehension Comprehension Mode: Auditory Comprehension: 5-Understands complex 90% of the time/Cues < 10% of the time  Expression Expression Mode: Verbal Expression: 5-Expresses basic needs/ideas: With no assist  Social Interaction Social Interaction: 6-Interacts appropriately with others with medication or extra time (anti-anxiety, antidepressant).  Problem Solving Problem Solving: 5-Solves basic 90% of the time/requires cueing < 10% of the time  Memory Memory: 5-Recognizes or recalls 90% of the time/requires cueing < 10% of the time  Medical Problem List and Plan:  1. Deconditioning after pneumonia/mild encephalopathy  2. DVT Prophylaxis/Anticoagulation: Chronic Coumadin therapy for left atrial thrombus. Continue intravenous heparin until INR greater than 2.00. Monitor for any bleeding episodes  3. Neuropsych: This patient Is capable of making decisions on his/her own behalf.  4. Healthcare associated pneumonia. Presently patient continues on Levaquin as well as Tamiflu. Taper Solu-Medrol as directed and confirmed duration of antibiotics. Followup chest x-ray and check oxygen saturations every shift. 5. Diabetes mellitus. Hemoglobin A1c of 9.1. Monitor closely while on steroids increase Will resume 70/30 insulin (home  Med)  Will be off steroids at discharge on 12/28 Check blood sugars a.c. and at bedtime  6. Hypertension/atrial fibrillation. Cardizem 300 mg daily. Tenormin 100 mg twice a day resumed today. Monitor with increased activity  7. Congestive heart failure. Monitor for any signs of fluid overload. Patient on no present diuretics. Patient was on Lasix 40 mg twice a day prior to admission and resumed today  8. Chronic renal insufficiency with baseline creatinine 1. 53. Latest creatinine 1.33 9.  Urinary incont check UA, although pt already on high dose levaquin   LOS (Days) 4 A FACE TO FACE EVALUATION WAS  PERFORMED  Trung Wenzl E 04/10/2012, 8:00 AM

## 2012-04-10 NOTE — Discharge Summary (Signed)
NAMEISA, KOHLENBERG NO.:  [id number]  MEDICAL RECORD NO.:  [id number]  LOCATION:  4035                         FACILITY:  [facility]  PHYSICIAN:  Mariam Dollar, P.A.  DATE OF BIRTH:  1935/05/19  DATE OF ADMISSION:  04/06/2012 DATE OF DISCHARGE:  04/11/2012                              DISCHARGE SUMMARY   DISCHARGE DIAGNOSES: 1. Deconditioning after pneumonia. 2. Chronic Coumadin therapy for left atrial thrombus. 3. Healthcare associated pneumonia. 4. Diabetes mellitus. 5. Hypertension with atrial fibrillation. 6. Congestive heart failure. 7. Chronic renal insufficiency with baseline creatinine 1.53.  This is a 76 year old right-handed male with history of diabetes mellitus, atrial fibrillation as well as left atrial thrombus on chronic Coumadin therapy.  He was admitted recently in September for coagulase negative staph bacteremia.  A TEE was negative.  Admitted on March 31, 2012, with nausea, vomiting, and new cough.  Patient was recently discharged from the hospital for that cardiac stenting in September as well as bacteremia.  Noted low-grade fever of 100.3.  Chest x-ray with chronic findings, questionable pneumonia.  Placed on broad-spectrum antibiotics.  Blood cultures and sputum cultures negative.  He did receive intravenous Solu-Medrol to taper as per medicine team.  He continued with droplet precautions.  Renal function stable with baseline creatinine 1.53.  He remained on chronic Coumadin therapy with no bleeding episodes with intravenous heparin to INR greater than 2.0.  He was admitted for comprehensive rehab program.  PAST MEDICAL HISTORY:  See discharge diagnoses.  SOCIAL HISTORY:  Lives with spouse.  Functional history prior to admission was use of an assistive device, but no driving.  Functional status upon admission to rehab services, minimal assist to ambulate 60 feet with a rolling walker.  PHYSICAL EXAMINATION:  VITAL SIGNS:  Blood  pressure 129/90, pulse 80, temperature 97.4, respirations 20. GENERAL:  This was an alert male, in no acute distress. HEENT:  Pupils round and reactive to light. LUNGS:  Decreased breath sounds.  Clear to auscultation. CARDIAC:  Rate controlled. ABDOMEN:  Soft, nontender.  Good bowel sounds.  REHABILITATION HOSPITAL COURSE:  Patient was admitted to inpatient rehab services with therapies initiated on a 3-hour daily basis consisting of physical therapy, occupational therapy, and rehabilitation nursing.  The following issues were addressed during the patient's rehabilitation stay.  Pertaining to Mr. Martinezlopez deconditioning after pneumonia remained stable.  Antibiotic therapy had since been discontinued.  He was completing a course of prednisone therapy.  He remained on chronic Coumadin therapy for a left atrial thrombus with no bleeding episodes. He was to be followed by [facility].  Dr. Rennis Golden with a home health nurse arranged to check next prothrombin time on Monday, April 13, 2012.  His diabetes mellitus is well controlled and monitored while on Solu-Medrol.  Hemoglobin A1c of 9.1.  Heart rate remained controlled with Cardizem as well as atenolol.  He did have a history of congestive heart failure.  He remained on Lasix 40 mg twice daily, no signs of fluid overload, chronic renal insufficiency with baseline creatinine of 1.53 and latest creatinine of 1.33.  Patient received weekly collaborative interdisciplinary team conferences to discuss estimated length of stay,  family teaching, and any barriers to discharge.  He was continent of bowel and bladder, minimal assist supervision with activities of daily living, supervision bed mobility, transfers, ambulating 170 feet with the rolling walker, navigating to stairs with bilateral hand rails.  Full family teaching was completed and plan will be discharged to home for ongoing therapies dictated as per rehab  services.  DISCHARGE MEDICATIONS:  Allopurinol 150 mg daily, Tenormin 100 mg twice daily, Cardizem 300 mg daily, Lasix 40 mg twice daily, insulin aspart, Protamine insulin 20 units twice daily, potassium chloride 20 mEq twice daily, Coumadin latest dose of 4 mg adjusted accordingly for an INR of 2.0 to 3.0.  DIET:  Diabetic diet.  SPECIAL INSTRUCTIONS:  Home health nurse to check INR on Monday, April 13, 2012, with results to Dr. Rennis Golden at [facility], [phone], fax [phone].  Patient to follow up Dr. Claudette Laws at the outpatient rehab center as needed.  Dr. Margaretmary Bayley medical management.     Mariam Dollar, P.A.     DA/MEDQ  D:  04/10/2012  T:  04/10/2012  Job:  [id number]  cc:   Erick Colace, M.D. Margaretmary Bayley, M.D. Zoila Shutter, M.D.

## 2012-04-10 NOTE — Progress Notes (Signed)
ANTICOAGULATION CONSULT NOTE - Follow Up Consult  Pharmacy Consult for coumadin Indication: afib and LA thrombus  No Known Allergies  Patient Measurements: Height: 5' 8.9" (175 cm) Weight: 245 lb 2.4 oz (111.2 kg) IBW/kg (Calculated) : 70.47  Heparin Dosing Weight:   Vital Signs: Temp: 97.5 F (36.4 C) (12/27 0433) Temp src: Oral (12/27 0433) BP: 145/93 mmHg (12/27 0433) Pulse Rate: 83  (12/27 0433)  Labs:  Basename 04/10/12 1000 04/09/12 0730 04/08/12 0500  HGB -- -- --  HCT -- -- --  PLT -- -- --  APTT -- -- --  LABPROT 30.4* 34.3* 34.7*  INR 3.12* 3.67* 3.73*  HEPARINUNFRC -- -- --  CREATININE -- -- --  CKTOTAL -- -- --  CKMB -- -- --  TROPONINI -- -- --    Estimated Creatinine Clearance: 59.8 ml/min (by C-G formula based on Cr of 1.33).   Medications:  Scheduled:    . albuterol  2.5 mg Nebulization BID  . allopurinol  150 mg Oral Daily  . antiseptic oral rinse  15 mL Mouth Rinse BID  . atenolol  100 mg Oral BID  . diltiazem  300 mg Oral Daily  . feeding supplement  1 Container Oral TID BM  . furosemide  40 mg Oral BID  . insulin aspart  0-20 Units Subcutaneous TID WC  . insulin aspart  0-5 Units Subcutaneous QHS  . insulin aspart protamine-insulin aspart  20 Units Subcutaneous BID WC  . ipratropium  0.5 mg Nebulization BID  . [COMPLETED] levofloxacin  750 mg Oral Daily  . potassium chloride  20 mEq Oral BID  . predniSONE  40 mg Oral Q breakfast  . warfarin  4 mg Oral ONCE-1800  . Warfarin - Pharmacist Dosing Inpatient   Does not apply q1800   Infusions:    Assessment: 76 yo male with atrial fibrillation and LA thrombus is currently on supratherapeutic coumadin.  INR is down to 3.12 today from 3.63 (high initially probably due to the few 8mg 's that he received few days ago).  Now off levaquin. Goal of Therapy:  INR 2-3    Plan:  1) Coumadin 4mg  po x1 2) INR in am.  Armanie Martine, Tsz-Yin 04/10/2012,11:36 AM

## 2012-04-10 NOTE — Progress Notes (Signed)
Physical Therapy Discharge Summary  Patient Details  Name: CLARK CUFF MRN: [id number] Date of Birth: 03-24-1936  Today's Date: 04/10/2012 Time: 8469-6295 and 2841-3244 Time Calculation (min): 60 min and 57 min  Patient has met 9 of 9 long term goals due to improved activity tolerance, improved balance and increased strength.  Patient to discharge at an ambulatory level Modified Independent.   Patient's care partner not trained secondary to patient discharging at mod I level with all functional mobility.  Reasons goals not met: N/a, patient met all LTGs  Recommendation:  Patient will benefit from ongoing skilled PT services in home health setting to continue to advance safe functional mobility, address ongoing impairments in activity tolerance, endurance, balance, strength, and minimize fall risk.  Equipment: No equipment provided  Reasons for discharge: treatment goals met and discharge from hospital  Patient/family agrees with progress made and goals achieved: Yes  PT Discharge Precautions/Restrictions Precautions Precautions: None Precaution Comments: Droplet Precautions Restrictions Weight Bearing Restrictions: No Pain Pain Assessment Pain Assessment: No/denies pain Pain Score: 0-No pain Cognition Overall Cognitive Status: Appears within functional limits for tasks assessed Arousal/Alertness: Awake/alert Orientation Level: Oriented X4 Sensation Sensation Light Touch: Appears Intact Stereognosis: Appears Intact Hot/Cold: Appears Intact Proprioception: Appears Intact Coordination Gross Motor Movements are Fluid and Coordinated: Yes Fine Motor Movements are Fluid and Coordinated: Yes Motor  Motor Motor: Within Functional Limits  Mobility Bed Mobility Bed Mobility: Supine to Sit;Sit to Supine Supine to Sit: 7: Independent;HOB flat Sit to Supine: 7: Independent;HOB flat Transfers Sit to Stand: 6: Modified independent (Device/Increase time);With armrests;From  bed;From chair/3-in-1;With upper extremity assist Stand to Sit: 6: Modified independent (Device/Increase time);With armrests;To bed;To chair/3-in-1;With upper extremity assist Car Transfer: 6: Modified independent (Device/Increase time) Locomotion  Ambulation Ambulation: Yes Ambulation/Gait Assistance: 6: Modified independent (Device/Increase time) Ambulation Distance (Feet): 300 Feet x1 (on tile and carpeted surfaces), 175' x1, 150'x1, 180'x1 (on tile and carpeted surfaces) Assistive device: Rolling walker Gait Gait: Yes Gait Pattern: Within Functional Limits Gait Pattern: Step-through pattern Stairs / Additional Locomotion Stairs: Yes Stairs Assistance: 6: Modified independent (Device/Increase time) Stair Management Technique: Two rails;Forwards;Step to pattern Number of Stairs: 5  Height of Stairs: 6  Ramp: 6: Modified independent (Device) Curb: 6: Modified independent (Device/increase time) Corporate treasurer: Yes Wheelchair Assistance: 5: Financial planner Details: Verbal cues for technique;Verbal cues for Copy: Both upper extremities;Both lower extermities Wheelchair Parts Management: Needs assistance Distance: 75  Balance Balance Balance Assessed: Yes Standardized Balance Assessment Standardized Balance Assessment: Berg Balance Test Berg Balance Test Sit to Stand: Able to stand without using hands and stabilize independently Standing Unsupported: Able to stand safely 2 minutes Sitting with Back Unsupported but Feet Supported on Floor or Stool: Able to sit safely and securely 2 minutes Stand to Sit: Sits safely with minimal use of hands Transfers: Able to transfer safely, definite need of hands Standing Unsupported with Eyes Closed: Able to stand 10 seconds safely Standing Ubsupported with Feet Together: Able to place feet together independently and stand 1 minute safely From Standing, Reach Forward with  Outstretched Arm: Can reach forward >5 cm safely (2") From Standing Position, Pick up Object from Floor: Able to pick up shoe safely and easily From Standing Position, Turn to Look Behind Over each Shoulder: Looks behind from both sides and weight shifts well Turn 360 Degrees: Able to turn 360 degrees safely but slowly Standing Unsupported, Alternately Place Feet on Step/Stool: Needs assistance to keep from falling or unable to try  Standing Unsupported, One Foot in Front: Able to take small step independently and hold 30 seconds Standing on One Leg: Tries to lift leg/unable to hold 3 seconds but remains standing independently Total Score: 42/56, indicating patient is at a significant risk for falls (>80%). Patient's BERG Balance Test score upon evaluation was 32/56; patient increased score by 10 points since evaluation. Extremity Assessment  RLE Assessment RLE Assessment: Within Functional Limits RLE Strength RLE Overall Strength: Within Functional Limits for tasks assessed RLE Overall Strength Comments: Grossly 4-4+/5 LLE Assessment LLE Assessment: Within Functional Limits LLE Strength LLE Overall Strength: Within Functional Limits for tasks assessed LLE Overall Strength Comments: Grossly 4+/5 Other Treatment Patient exercised on NuStep, Lvl 3, to promote increased B UE and B LE strength and cardiovascular endurance x12'.  See FIM for current functional status  Chipper Herb. Ailyne Pawley, PT, DPT  04/10/2012, 11:01 AM

## 2012-04-10 NOTE — Discharge Summary (Signed)
  Discharge summary job [id number]

## 2012-04-10 NOTE — Progress Notes (Addendum)
Occupational Therapy Discharge Summary  Patient Details  Name: KOUPER SPINELLA MRN: [id number] Date of Birth: [birth year]  Today's Date: 04/10/2012 Time: 0930-1030 (1130-1200) Time Calculation (min): 60 min (30 min)  Patient has met 10 of 10 long term goals due to improved activity tolerance, improved balance, ability to compensate for deficits, improved attention and improved awareness.  Patient to discharge at overall Modified Independent level.        Recommendation:  Patient does not require follow up OT services at Discharge.  Equipment: No equipment provided Patient has all necessary equipment.  Reasons for discharge: Patient met maximum potential for rehab.  Patient/family agrees with progress made and goals achieved: Yes  OT Discharge Precautions/Restrictions  Precautions Precautions: None Precaution Comments: Droplet Precautions Restrictions Weight Bearing Restrictions: No Pain Pain Assessment Pain Assessment: No/denies pain Pain Score: 0-No pain Vision/Perception  Vision - History Baseline Vision:  (Needs glasses. Unable to read fine print) Patient Visual Report: No change from baseline  Cognition Overall Cognitive Status: Appears within functional limits for tasks assessed Arousal/Alertness: Awake/alert Orientation Level: Oriented X4 Sensation Sensation Light Touch: Appears Intact Stereognosis: Appears Intact Hot/Cold: Appears Intact Proprioception: Appears Intact Coordination Gross Motor Movements are Fluid and Coordinated: Yes Fine Motor Movements are Fluid and Coordinated: Yes Motor  Motor Motor: Within Functional Limits  Extremity/Trunk Assessment RUE Assessment RUE Assessment: Within Functional Limits (MMT: 5/5. A/ROM WFL in all ranges.) LUE Assessment LUE Assessment: Within Functional Limits (MMT: 5/5 A/ROM WFL in all ranges.)  See FIM for current functional status  Session 1: D/C ADL completed in walk-in shower this AM. Patient used  walker to ambulated from w/c to shower. Patient had no LOB or safety concerns. Patient walked out to w/c after shower and retrieved clothes. Patient dressed with walker in front of him when standing. No vc's needed for safety awareness. Patient has met all ADL goals and is ready for D/C home tomorrow.   Session 2:  Pt participated in UB strengthening exercises. See exercises below. Patient ambulated from gym to room with rolling walker without vc's for safety awareness during functional mobility. Patient was very fatigued during tx session from all morning therapy. Patient took several rest breaks when needed. Patient was left in room with call light, walker and lunch tray.    04/10/12 1100  General Exercises - Upper Extremity  Shoulder Flexion Strengthening;Both;Seated;Bar weights/barbell (12 reps; 2 sets)  Bar Weights/Barbell (Shoulder Flexion) 4 lbs  Elbow Flexion Strengthening;Both;Seated;Bar weights/barbell (12 reps; 2 sets)  Bar Weights/Barbell (Elbow Flexion) 4 lbs  Elbow Extension Strengthening;Both;Seated;Bar weights/barbell (12 reps; 2 sets)  Bar Weights/Barbell (Elbow Extension) 4 lbs  Other Exercises  Other Exercises Chest press; B UE; 4lb weighted bar; 2 sets; 12 reps  Other Exercises Trunk rotation; Held 5lb weighted ball B UE; 12 reps; 1 set; left/right  Other Exercises Circles; forward/backwards; B UE; 2 sets; 12 reps    Limmie Patricia, OTR/L 04/10/2012, 10:09 AM    Addendum: Added session 2 times and minutes. LE 04/10/12

## 2012-04-11 DIAGNOSIS — R5381 Other malaise: Secondary | ICD-10-CM

## 2012-04-11 LAB — GLUCOSE, CAPILLARY

## 2012-04-11 MED ORDER — WARFARIN SODIUM 6 MG PO TABS
6.0000 mg | ORAL_TABLET | Freq: Once | ORAL | Status: DC
  Filled 2012-04-11: qty 1

## 2012-04-11 NOTE — Plan of Care (Signed)
Problem: RH BOWEL ELIMINATION Goal: RH STG MANAGE BOWEL WITH ASSISTANCE STG Manage Bowel with Mod I Assistance.  Outcome: Not Progressing LBM 12/24  Problem: RH SAFETY Goal: RH STG ADHERE TO SAFETY PRECAUTIONS W/ASSISTANCE/DEVICE STG Adhere to Safety Precautions With Mod I Assistance/Device.  Outcome: Completed/Met Date Met:  04/11/12 Patient is Modified Independent in the room  Problem: RH PAIN MANAGEMENT Goal: RH STG PAIN MANAGED AT OR BELOW PT'S PAIN GOAL 3 or less  Outcome: Completed/Met Date Met:  04/11/12 No complaints of pain

## 2012-04-11 NOTE — Progress Notes (Signed)
1025 Pt. Discharged to home with family.  AOX3 and VSS. Discharge instructions reviewed with daughter and patient; provided with printed handouts by Sherol Dade, PA. No further questions asked.  Escorted to vehicle by NT.  All personal belongings in tow.

## 2012-04-11 NOTE — Progress Notes (Signed)
ANTICOAGULATION CONSULT NOTE - Follow Up Consult  Pharmacy Consult for coumadin Indication: afib and LA thrombus  No Known Allergies  Patient Measurements: Height: 5' 8.9" (175 cm) Weight: 231 lb 7.7 oz (105 kg) IBW/kg (Calculated) : 70.47    Vital Signs: Temp: 97.4 F (36.3 C) (12/28 0700) Temp src: Oral (12/28 0700) BP: 168/79 mmHg (12/28 0700) Pulse Rate: 83  (12/28 0700)  Labs:  Basename 04/11/12 0805 04/10/12 1000 04/09/12 0730  HGB -- -- --  HCT -- -- --  PLT -- -- --  APTT -- -- --  LABPROT 24.2* 30.4* 34.3*  INR 2.29* 3.12* 3.67*  HEPARINUNFRC -- -- --  CREATININE -- -- --  CKTOTAL -- -- --  CKMB -- -- --  TROPONINI -- -- --    Estimated Creatinine Clearance: 58.1 ml/min (by C-G formula based on Cr of 1.33).   Medications:  Scheduled:     . albuterol  2.5 mg Nebulization BID  . allopurinol  150 mg Oral Daily  . antiseptic oral rinse  15 mL Mouth Rinse BID  . atenolol  100 mg Oral BID  . diltiazem  300 mg Oral Daily  . feeding supplement  1 Container Oral TID BM  . furosemide  40 mg Oral BID  . insulin aspart  0-20 Units Subcutaneous TID WC  . insulin aspart  0-5 Units Subcutaneous QHS  . insulin aspart protamine-insulin aspart  20 Units Subcutaneous BID WC  . ipratropium  0.5 mg Nebulization BID  . potassium chloride  20 mEq Oral BID  . [COMPLETED] predniSONE  40 mg Oral Q breakfast  . [COMPLETED] warfarin  4 mg Oral ONCE-1800  . Warfarin - Pharmacist Dosing Inpatient   Does not apply q1800   Infusions:    Assessment: 76 yo male with atrial fibrillation and LA thrombus is currently on supratherapeutic coumadin.  INR is down to 2.29 after being supratherapeutic for a number of days (high initially probably due to the few 8mg 's that he received few days ago).  Now off levaquin.  Goal of Therapy:  INR 2-3   Plan:  1) Increase Coumadin to 6mg  po x1 in attempt to prevent INR from becoming subtherapeutic 2) Check daily INR  Jill Side L. Illene Bolus,  PharmD, BCPS Clinical Pharmacist Pager: [phone] Pharmacy: [phone] 04/11/2012 10:00 AM

## 2012-04-11 NOTE — Progress Notes (Signed)
Patient ID: Vincent [name], male   DOB: Jan 04, 1936, 76 y.o.   MRN: [id number]  Subjective/Complaints: Excited to go home today  Afebrile No breathing problems Poor bladder control ,still  Review of Systems  Constitutional: Negative for fever.  Respiratory: Negative for shortness of breath and wheezing.   Cardiovascular: Negative for chest pain and orthopnea.  All other systems reviewed and are negative.    Objective: Vital Signs: Blood pressure 148/69, pulse 93, temperature 97.5 F (36.4 C), temperature source Oral, resp. rate 18, height 5' 8.9" (1.75 m), weight 111.2 kg (245 lb 2.4 oz), SpO2 18.00%. Dg Chest 2 View  04/09/2012  *RADIOLOGY REPORT*  Clinical Data:  Pneumonia, follow-up, finishing antibiotics  CHEST - 2 VIEW  Comparison: 04/03/2012  Findings: Upper normal heart size. Atherosclerotic calcification aorta. Mediastinal contours and pulmonary vascularity normal. Bronchitic changes with decrease in perihilar markings versus previous study. Calcified granuloma right upper lobe. No acute infiltrate, pleural effusion or pneumothorax. Multilevel endplate spur formation thoracic spine.  IMPRESSION: Bronchitic changes with decreased accentuation perihilar markings versus previous study suggesting improved infiltrate.   Original Report Authenticated By: Ulyses Southward, M.D.    Results for orders placed during the hospital encounter of 04/06/12 (from the past 72 hour(s))  GLUCOSE, CAPILLARY     Status: Abnormal   Collection Time   04/08/12  7:26 AM      Component Value Range Comment   Glucose-Capillary 217 (*) 70 - 99 mg/dL    Comment 1 Notify RN     GLUCOSE, CAPILLARY     Status: Abnormal   Collection Time   04/08/12 11:18 AM      Component Value Range Comment   Glucose-Capillary 205 (*) 70 - 99 mg/dL    Comment 1 Notify RN     GLUCOSE, CAPILLARY     Status: Abnormal   Collection Time   04/08/12  4:13 PM      Component Value Range Comment   Glucose-Capillary 261 (*) 70 - 99 mg/dL     Comment 1 Notify RN     GLUCOSE, CAPILLARY     Status: Abnormal   Collection Time   04/08/12  9:29 PM      Component Value Range Comment   Glucose-Capillary 221 (*) 70 - 99 mg/dL    Comment 1 Notify RN     PROTIME-INR     Status: Abnormal   Collection Time   04/09/12  7:30 AM      Component Value Range Comment   Prothrombin Time 34.3 (*) 11.6 - 15.2 seconds    INR 3.67 (*) 0.00 - 1.49   GLUCOSE, CAPILLARY     Status: Abnormal   Collection Time   04/09/12  9:23 AM      Component Value Range Comment   Glucose-Capillary 274 (*) 70 - 99 mg/dL    Comment 1 Notify RN     GLUCOSE, CAPILLARY     Status: Abnormal   Collection Time   04/09/12 12:00 PM      Component Value Range Comment   Glucose-Capillary 267 (*) 70 - 99 mg/dL    Comment 1 Notify RN     GLUCOSE, CAPILLARY     Status: Abnormal   Collection Time   04/09/12  5:00 PM      Component Value Range Comment   Glucose-Capillary 173 (*) 70 - 99 mg/dL   GLUCOSE, CAPILLARY     Status: Abnormal   Collection Time   04/09/12  9:05 PM  Component Value Range Comment   Glucose-Capillary 228 (*) 70 - 99 mg/dL   GLUCOSE, CAPILLARY     Status: Abnormal   Collection Time   04/10/12  7:22 AM      Component Value Range Comment   Glucose-Capillary 183 (*) 70 - 99 mg/dL    Comment 1 Notify RN     PROTIME-INR     Status: Abnormal   Collection Time   04/10/12 10:00 AM      Component Value Range Comment   Prothrombin Time 30.4 (*) 11.6 - 15.2 seconds    INR 3.12 (*) 0.00 - 1.49   GLUCOSE, CAPILLARY     Status: Abnormal   Collection Time   04/10/12 11:49 AM      Component Value Range Comment   Glucose-Capillary 175 (*) 70 - 99 mg/dL    Comment 1 Notify RN     GLUCOSE, CAPILLARY     Status: Abnormal   Collection Time   04/10/12  5:53 PM      Component Value Range Comment   Glucose-Capillary 297 (*) 70 - 99 mg/dL   URINALYSIS, ROUTINE W REFLEX MICROSCOPIC     Status: Abnormal   Collection Time   04/10/12  8:21 PM       Component Value Range Comment   Color, Urine YELLOW  YELLOW    APPearance CLEAR  CLEAR    Specific Gravity, Urine 1.014  1.005 - 1.030    pH 7.0  5.0 - 8.0    Glucose, UA 500 (*) NEGATIVE mg/dL    Hgb urine dipstick NEGATIVE  NEGATIVE    Bilirubin Urine NEGATIVE  NEGATIVE    Ketones, ur NEGATIVE  NEGATIVE mg/dL    Protein, ur NEGATIVE  NEGATIVE mg/dL    Urobilinogen, UA 0.2  0.0 - 1.0 mg/dL    Nitrite NEGATIVE  NEGATIVE    Leukocytes, UA NEGATIVE  NEGATIVE MICROSCOPIC NOT DONE ON URINES WITH NEGATIVE PROTEIN, BLOOD, LEUKOCYTES, NITRITE, OR GLUCOSE <1000 mg/dL.  GLUCOSE, CAPILLARY     Status: Abnormal   Collection Time   04/10/12  9:23 PM      Component Value Range Comment   Glucose-Capillary 356 (*) 70 - 99 mg/dL       General: Alert and oriented x 2-3, No apparent distress.  HEENT: Head is normocephalic, atraumatic, PERRLA, EOMI, sclera anicteric, oral mucosa pink and moist, dentition intact, ext ear canals clear,  Neck: Supple without JVD or lymphadenopathy  Heart: Reg rate and rhythm. No murmurs rubs or gallops  Chest: no wheezes, decreased breath sounds at bases, no rhonchi; no distress  Abdomen: Soft, non-tender, non-distended, bowel sounds positive.  Extremities: No clubbing, cyanosis; 1+ lower ext edema. Pulses are 2+  Skin: Clean and intact without signs of breakdown  Neuro: Pt is cognitively appropriate with fair insight.. Cranial nerves 2-12 are intact. Sensory exam is normal. Reflexes are 2+ in all 4's. Fine motor coordination is intact.Motor function is grossly 4/5 UE. LE 4- proximally, 4/5 distally. Mild sensory loss distally in stocking glove distribution over both legs.  Musculoskeletal: . Posture appropriate  Psych: Pt's affect is appropriate. Pt is cooperative   Assessment/Plan: 1. Functional deficits secondary to deconditioning after pneumonia which require 3+ hours per day of interdisciplinary therapy in a comprehensive inpatient rehab setting. Physiatrist is  providing close team supervision and 24 hour management of active medical problems listed below. Physiatrist and rehab team continue to assess barriers to discharge/monitor patient progress toward functional and medical goals. FIM:  FIM - Bathing Bathing Steps Patient Completed: Chest;Right Arm;Left Arm;Abdomen;Front perineal area;Buttocks;Right upper leg;Left upper leg;Right lower leg (including foot);Left lower leg (including foot) Bathing: 6: More than reasonable amount of time  FIM - Upper Body Dressing/Undressing Upper body dressing/undressing steps patient completed: Thread/unthread right sleeve of pullover shirt/dresss;Thread/unthread left sleeve of pullover shirt/dress;Put head through opening of pull over shirt/dress;Pull shirt over trunk Upper body dressing/undressing: 7: Complete Independence: No helper FIM - Lower Body Dressing/Undressing Lower body dressing/undressing steps patient completed: Thread/unthread right underwear leg;Thread/unthread left underwear leg;Pull underwear up/down;Thread/unthread right pants leg;Thread/unthread left pants leg;Pull pants up/down;Don/Doff right sock;Don/Doff left sock Lower body dressing/undressing: 6: More than reasonable amount of time  FIM - Toileting Toileting steps completed by patient: Adjust clothing prior to toileting;Performs perineal hygiene;Adjust clothing after toileting Toileting Assistive Devices: Grab bar or rail for support Toileting: 6: More than reasonable amount of time  FIM - Diplomatic Services operational officer Devices: Art gallery manager Transfers: 6-More than reasonable amt of time  FIM - Banker Devices: Arm rests Bed/Chair Transfer: 6: Bed > Chair or W/C: No assist  FIM - Locomotion: Wheelchair Distance: 75 Locomotion: Wheelchair: 2: Travels 50 - 149 ft with supervision, cueing or coaxing FIM - Locomotion: Ambulation Locomotion: Ambulation Assistive Devices: Dealer Ambulation/Gait Assistance: 6: Modified independent (Device/Increase time) Locomotion: Ambulation: 6: Travels 150 ft or more with assistive device/no helper  Comprehension Comprehension Mode: Auditory Comprehension: 7-Follows complex conversation/direction: With no assist  Expression Expression Mode: Verbal Expression: 5-Expresses basic needs/ideas: With no assist  Social Interaction Social Interaction: 6-Interacts appropriately with others with medication or extra time (anti-anxiety, antidepressant).  Problem Solving Problem Solving: 5-Solves basic 90% of the time/requires cueing < 10% of the time  Memory Memory: 5-Recognizes or recalls 90% of the time/requires cueing < 10% of the time  Medical Problem List and Plan:  1. Deconditioning after pneumonia/mild encephalopathy  2. DVT Prophylaxis/Anticoagulation: Chronic Coumadin therapy for left atrial thrombus. Continue intravenous heparin until INR greater than 2.00. Monitor for any bleeding episodes  3. Neuropsych: This patient Is capable of making decisions on his/her own behalf.  4. Healthcare associated pneumonia. Presently patient continues on Levaquin as well as Tamiflu. Taper Solu-Medrol as directed and confirmed duration of antibiotics. Followup chest x-ray and check oxygen saturations every shift. 5. Diabetes mellitus. Hemoglobin A1c of 9.1. Monitor closely while on steroids increase Will resume 70/30 insulin (home  Med)  Will be off steroids at discharge on 12/28 Check blood sugars a.c. and at bedtime/ needs better control at home. Follow up with pcp 6. Hypertension/atrial fibrillation. Cardizem 300 mg daily. Tenormin 100 mg twice a day resumed today. Monitor with increased activity  7. Congestive heart failure. Monitor for any signs of fluid overload. Patient on no present diuretics. Patient was on Lasix 40 mg twice a day prior to admission and resumed today  8. Chronic renal insufficiency with baseline creatinine 1. 53.  Latest creatinine 1.33 9.  Urinary incont check UA, ua negative. Timed voids recommended   LOS (Days) 5 A FACE TO FACE EVALUATION WAS PERFORMED  SWARTZ,ZACHARY T 04/11/2012, 6:36 AM

## 2012-04-13 ENCOUNTER — Observation Stay (HOSPITAL_COMMUNITY): Payer: Medicare Other

## 2012-04-13 ENCOUNTER — Encounter (HOSPITAL_COMMUNITY): Payer: Self-pay | Admitting: Cardiology

## 2012-04-13 ENCOUNTER — Inpatient Hospital Stay (HOSPITAL_COMMUNITY)
Admission: EM | Admit: 2012-04-13 | Discharge: 2012-04-18 | DRG: 309 | Disposition: A | Discharge status: Home / Self Care | Payer: Medicare Other | Attending: Internal Medicine | Admitting: Internal Medicine

## 2012-04-13 DIAGNOSIS — I5033 Acute on chronic diastolic (congestive) heart failure: Secondary | ICD-10-CM

## 2012-04-13 DIAGNOSIS — C61 Malignant neoplasm of prostate: Secondary | ICD-10-CM

## 2012-04-13 DIAGNOSIS — I5032 Chronic diastolic (congestive) heart failure: Secondary | ICD-10-CM | POA: Diagnosis present

## 2012-04-13 DIAGNOSIS — K5641 Fecal impaction: Secondary | ICD-10-CM

## 2012-04-13 DIAGNOSIS — R7881 Bacteremia: Secondary | ICD-10-CM

## 2012-04-13 DIAGNOSIS — Z91199 Patient's noncompliance with other medical treatment and regimen due to unspecified reason: Secondary | ICD-10-CM

## 2012-04-13 DIAGNOSIS — Z794 Long term (current) use of insulin: Secondary | ICD-10-CM

## 2012-04-13 DIAGNOSIS — K5900 Constipation, unspecified: Secondary | ICD-10-CM

## 2012-04-13 DIAGNOSIS — R112 Nausea with vomiting, unspecified: Secondary | ICD-10-CM

## 2012-04-13 DIAGNOSIS — J189 Pneumonia, unspecified organism: Secondary | ICD-10-CM

## 2012-04-13 DIAGNOSIS — Z9119 Patient's noncompliance with other medical treatment and regimen: Secondary | ICD-10-CM

## 2012-04-13 DIAGNOSIS — I10 Essential (primary) hypertension: Secondary | ICD-10-CM | POA: Diagnosis present

## 2012-04-13 DIAGNOSIS — Z8546 Personal history of malignant neoplasm of prostate: Secondary | ICD-10-CM

## 2012-04-13 DIAGNOSIS — I513 Intracardiac thrombosis, not elsewhere classified: Secondary | ICD-10-CM | POA: Diagnosis present

## 2012-04-13 DIAGNOSIS — D72829 Elevated white blood cell count, unspecified: Secondary | ICD-10-CM

## 2012-04-13 DIAGNOSIS — R531 Weakness: Secondary | ICD-10-CM | POA: Diagnosis present

## 2012-04-13 DIAGNOSIS — D649 Anemia, unspecified: Secondary | ICD-10-CM

## 2012-04-13 DIAGNOSIS — I509 Heart failure, unspecified: Secondary | ICD-10-CM | POA: Diagnosis present

## 2012-04-13 DIAGNOSIS — I4891 Unspecified atrial fibrillation: Secondary | ICD-10-CM | POA: Diagnosis present

## 2012-04-13 DIAGNOSIS — N183 Chronic kidney disease, stage 3 unspecified: Secondary | ICD-10-CM | POA: Diagnosis present

## 2012-04-13 DIAGNOSIS — I5022 Chronic systolic (congestive) heart failure: Secondary | ICD-10-CM

## 2012-04-13 DIAGNOSIS — E119 Type 2 diabetes mellitus without complications: Secondary | ICD-10-CM | POA: Diagnosis present

## 2012-04-13 DIAGNOSIS — I480 Paroxysmal atrial fibrillation: Secondary | ICD-10-CM

## 2012-04-13 DIAGNOSIS — I471 Supraventricular tachycardia: Secondary | ICD-10-CM

## 2012-04-13 DIAGNOSIS — N390 Urinary tract infection, site not specified: Secondary | ICD-10-CM

## 2012-04-13 DIAGNOSIS — E860 Dehydration: Secondary | ICD-10-CM

## 2012-04-13 DIAGNOSIS — Z7901 Long term (current) use of anticoagulants: Secondary | ICD-10-CM

## 2012-04-13 DIAGNOSIS — K219 Gastro-esophageal reflux disease without esophagitis: Secondary | ICD-10-CM | POA: Diagnosis present

## 2012-04-13 DIAGNOSIS — I129 Hypertensive chronic kidney disease with stage 1 through stage 4 chronic kidney disease, or unspecified chronic kidney disease: Secondary | ICD-10-CM | POA: Diagnosis present

## 2012-04-13 DIAGNOSIS — I498 Other specified cardiac arrhythmias: Principal | ICD-10-CM | POA: Diagnosis present

## 2012-04-13 DIAGNOSIS — I499 Cardiac arrhythmia, unspecified: Secondary | ICD-10-CM

## 2012-04-13 DIAGNOSIS — I5189 Other ill-defined heart diseases: Secondary | ICD-10-CM | POA: Diagnosis present

## 2012-04-13 DIAGNOSIS — R5381 Other malaise: Secondary | ICD-10-CM | POA: Diagnosis present

## 2012-04-13 HISTORY — DX: Intracardiac thrombosis, not elsewhere classified: I51.3

## 2012-04-13 HISTORY — DX: Fecal impaction: K56.41

## 2012-04-13 HISTORY — DX: Paroxysmal atrial fibrillation: I48.0

## 2012-04-13 HISTORY — DX: Heart disease, unspecified: I51.9

## 2012-04-13 HISTORY — DX: Type 2 diabetes mellitus without complications: E11.9

## 2012-04-13 LAB — CBC WITH DIFFERENTIAL/PLATELET
Eosinophils Relative: 2 % (ref 0–5)
HCT: 43.9 % (ref 39.0–52.0)
Hemoglobin: 14 g/dL (ref 13.0–17.0)
Lymphocytes Relative: 13 % (ref 12–46)
Lymphs Abs: 2.1 10*3/uL (ref 0.7–4.0)
MCV: 91.6 fL (ref 78.0–100.0)
Monocytes Absolute: 1.7 10*3/uL — ABNORMAL HIGH (ref 0.1–1.0)
Neutro Abs: 11.9 10*3/uL — ABNORMAL HIGH (ref 1.7–7.7)
RBC: 4.79 MIL/uL (ref 4.22–5.81)
WBC: 15.9 10*3/uL — ABNORMAL HIGH (ref 4.0–10.5)

## 2012-04-13 LAB — POCT I-STAT 3, VENOUS BLOOD GAS (G3P V)
Acid-Base Excess: 10 mmol/L — ABNORMAL HIGH (ref 0.0–2.0)
Bicarbonate: 32.6 mEq/L — ABNORMAL HIGH (ref 20.0–24.0)
pH, Ven: 7.587 — ABNORMAL HIGH (ref 7.250–7.300)
pO2, Ven: 53 mmHg — ABNORMAL HIGH (ref 30.0–45.0)

## 2012-04-13 LAB — HEMATOCRIT: HCT: 44.1 % (ref 39.0–52.0)

## 2012-04-13 LAB — HEPATIC FUNCTION PANEL
AST: 49 U/L — ABNORMAL HIGH (ref 0–37)
Albumin: 3.2 g/dL — ABNORMAL LOW (ref 3.5–5.2)
Total Bilirubin: 0.6 mg/dL (ref 0.3–1.2)

## 2012-04-13 LAB — BASIC METABOLIC PANEL
CO2: 28 mEq/L (ref 19–32)
Calcium: 9.4 mg/dL (ref 8.4–10.5)
Chloride: 96 mEq/L (ref 96–112)
Creatinine, Ser: 1.46 mg/dL — ABNORMAL HIGH (ref 0.50–1.35)
Glucose, Bld: 336 mg/dL — ABNORMAL HIGH (ref 70–99)
Sodium: 138 mEq/L (ref 135–145)

## 2012-04-13 LAB — URINALYSIS, ROUTINE W REFLEX MICROSCOPIC
Glucose, UA: 250 mg/dL — AB
Leukocytes, UA: NEGATIVE
Protein, ur: NEGATIVE mg/dL
pH: 7.5 (ref 5.0–8.0)

## 2012-04-13 LAB — URINE MICROSCOPIC-ADD ON

## 2012-04-13 LAB — POCT I-STAT TROPONIN I: Troponin i, poc: 0.08 ng/mL (ref 0.00–0.08)

## 2012-04-13 LAB — PRO B NATRIURETIC PEPTIDE: Pro B Natriuretic peptide (BNP): 6402 pg/mL — ABNORMAL HIGH (ref 0–125)

## 2012-04-13 LAB — CG4 I-STAT (LACTIC ACID): Lactic Acid, Venous: 3.61 mmol/L — ABNORMAL HIGH (ref 0.5–2.2)

## 2012-04-13 MED ORDER — ATENOLOL 50 MG PO TABS
100.0000 mg | ORAL_TABLET | Freq: Once | ORAL | Status: AC
  Administered 2012-04-13: 100 mg via ORAL
  Filled 2012-04-13: qty 2

## 2012-04-13 MED ORDER — INSULIN ASPART PROT & ASPART (70-30 MIX) 100 UNIT/ML ~~LOC~~ SUSP
20.0000 [IU] | Freq: Once | SUBCUTANEOUS | Status: AC
  Administered 2012-04-13: 20 [IU] via SUBCUTANEOUS
  Filled 2012-04-13: qty 3

## 2012-04-13 MED ORDER — SODIUM CHLORIDE 0.9 % IV BOLUS (SEPSIS)
500.0000 mL | Freq: Once | INTRAVENOUS | Status: AC
  Administered 2012-04-13: 500 mL via INTRAVENOUS

## 2012-04-13 MED ORDER — DILTIAZEM HCL ER BEADS 300 MG PO CP24
300.0000 mg | ORAL_CAPSULE | Freq: Once | ORAL | Status: AC
  Administered 2012-04-13: 300 mg via ORAL
  Filled 2012-04-13: qty 1

## 2012-04-13 MED ORDER — WARFARIN - PHYSICIAN DOSING INPATIENT: Freq: Every day | Status: DC

## 2012-04-13 MED ORDER — WARFARIN SODIUM 4 MG PO TABS
4.0000 mg | ORAL_TABLET | Freq: Once | ORAL | Status: AC
  Administered 2012-04-13: 4 mg via ORAL
  Filled 2012-04-13: qty 1

## 2012-04-13 MED ORDER — ALLOPURINOL 150 MG HALF TABLET
150.0000 mg | ORAL_TABLET | Freq: Once | ORAL | Status: AC
  Administered 2012-04-13: 150 mg via ORAL
  Filled 2012-04-13: qty 1

## 2012-04-13 MED ORDER — POLYETHYLENE GLYCOL 3350 17 G PO PACK
17.0000 g | PACK | Freq: Once | ORAL | Status: AC
  Administered 2012-04-13: 17 g via ORAL
  Filled 2012-04-13: qty 1

## 2012-04-13 NOTE — ED Provider Notes (Signed)
History     CSN: [id number]  Arrival date & time 04/13/12  1639   First MD Initiated Contact with Patient 04/13/12 1812      Chief Complaint  Patient presents with  . Constipation    HPI chief complaint: Constipation. Onset 3 days ago. Location home. Not worsened or improved by anything. Severity mild. Timing constant. Duration 3 days. Regarding social history see nurse's notes. No recent family febrile or viral illnesses. I reviewed the patient's past medical, past surgical, social history as well as medications and allergies.  Past Medical History  Diagnosis Date  . Diabetes mellitus   . Hypertension   . CHF (congestive heart failure)   . Cancer   . Pneumonia   . Irregular heartbeat   . Prostate cancer   . Shortness of breath   . GERD (gastroesophageal reflux disease)   . Acute on chronic diastolic CHF (congestive heart failure) 12/18/2011  . Atrial fibrillation with rapid ventricular response, history of PAF was in SR in 08/2011 12/18/2011  . CKD (chronic kidney disease) stage 3, GFR 30-59 ml/min 12/26/2011    Past Surgical History  Procedure Date  . Prostate surgery   . Tee without cardioversion 12/27/2011    Procedure: TRANSESOPHAGEAL ECHOCARDIOGRAM (TEE);  Surgeon: Chrystie Nose, MD;  Location: [facility] OR;  Service: Cardiovascular;  Laterality: N/A;  MD request Main OR  . Cardioversion 12/27/2011    Procedure: CARDIOVERSION;  Surgeon: Chrystie Nose, MD;  Location: [facility] OR;  Service: Cardiovascular;  Laterality: N/A;    Family History  Problem Relation Age of Onset  . Diabetes Mellitus II Mother   . Sudden death Mother   . Prostate cancer Father     History  Substance Use Topics  . Smoking status: Never Smoker   . Smokeless tobacco: Never Used  . Alcohol Use: No      Review of Systems  Constitutional: Positive for fatigue. Negative for fever and chills.  HENT: Negative for hearing loss, ear pain, congestion, trouble swallowing, neck pain, neck stiffness and  sinus pressure.   Eyes: Negative for photophobia and visual disturbance.  Respiratory: Negative for cough, chest tightness and shortness of breath.   Cardiovascular: Negative for chest pain, palpitations and leg swelling.  Gastrointestinal: Positive for constipation. Negative for nausea, vomiting, abdominal pain, diarrhea, blood in stool and abdominal distention.  Genitourinary: Negative for dysuria, urgency, hematuria, flank pain, decreased urine volume and difficulty urinating.  Musculoskeletal: Negative for back pain and gait problem.  Skin: Negative for rash.  Neurological: Negative for dizziness, tremors, seizures, syncope, facial asymmetry, speech difficulty, weakness, light-headedness, numbness and headaches.  Hematological: Negative for adenopathy. Does not bruise/bleed easily.  Psychiatric/Behavioral: Negative for confusion.    Allergies  Review of patient's allergies indicates no known allergies.  Home Medications   Current Outpatient Rx  Name  Route  Sig  Dispense  Refill  . ALLOPURINOL 100 MG PO TABS   Oral   Take 1.5 tablets (150 mg total) by mouth every morning.   30 tablet   1   . ATENOLOL 100 MG PO TABS   Oral   Take 1 tablet (100 mg total) by mouth 2 (two) times daily.   60 tablet   1   . DILTIAZEM HCL ER BEADS 300 MG PO CP24   Oral   Take 1 capsule (300 mg total) by mouth every morning.   30 capsule   1   . FUROSEMIDE 80 MG PO TABS   Oral  Take 0.5 tablets (40 mg total) by mouth 2 (two) times daily.   30 tablet   1   . INSULIN ASPART PROT & ASPART (70-30) 100 UNIT/ML [place] SUSP   Subcutaneous   Inject 20 Units into the skin 2 (two) times daily with a meal.         . POLYETHYLENE GLYCOL 3350 PO PACK   Oral   Take 17 g by mouth daily as needed. For constipation         . POTASSIUM CHLORIDE CRYS ER 10 MEQ PO TBCR   Oral   Take 2 tablets (20 mEq total) by mouth 2 (two) times daily.   60 tablet   1   . WARFARIN SODIUM 4 MG PO TABS   Oral    Take 4 mg by mouth every evening.           BP 103/77  Pulse 107  Temp 97.5 F (36.4 C) (Rectal)  Resp 18  SpO2 96%  Physical Exam  Constitutional: He is oriented to person, place, and time. He appears well-developed and well-nourished. No distress.  HENT:  Head: Normocephalic.  Eyes: Conjunctivae normal are normal.  Neck: Normal range of motion. Neck supple.  Cardiovascular: Normal rate, regular rhythm, normal heart sounds and intact distal pulses.   No murmur heard. Pulmonary/Chest: Effort normal and breath sounds normal. No respiratory distress. He has no wheezes. He has no rales. He exhibits no tenderness.  Abdominal: Soft. Bowel sounds are normal. He exhibits no distension and no mass. There is no tenderness. There is no rebound and no guarding.  Genitourinary:       Stool impaction noted on digital rectal exam.  Musculoskeletal: Normal range of motion. He exhibits no edema and no tenderness.  Neurological: He is alert and oriented to person, place, and time.  Skin: Skin is warm and dry. He is not diaphoretic.  Psychiatric: He has a normal mood and affect.    ED Course  Procedures (including critical care time)  Labs Reviewed  CBC WITH DIFFERENTIAL - Abnormal; Notable for the following:    WBC 15.9 (*)     RDW 16.9 (*)     Neutro Abs 11.9 (*)     Monocytes Absolute 1.7 (*)     All other components within normal limits  BASIC METABOLIC PANEL - Abnormal; Notable for the following:    Glucose, Bld 336 (*)     BUN 43 (*)     Creatinine, Ser 1.46 (*)     GFR calc non Af Amer 46 (*)     GFR calc Af Amer 53 (*)     All other components within normal limits  URINALYSIS, ROUTINE W REFLEX MICROSCOPIC - Abnormal; Notable for the following:    Glucose, UA 250 (*)     Hgb urine dipstick MODERATE (*)     All other components within normal limits  HEPATIC FUNCTION PANEL - Abnormal; Notable for the following:    Albumin 3.2 (*)     AST 49 (*)     ALT 58 (*)     Alkaline  Phosphatase 127 (*)     All other components within normal limits  PRO B NATRIURETIC PEPTIDE - Abnormal; Notable for the following:    Pro B Natriuretic peptide (BNP) 6402.0 (*)     All other components within normal limits  CG4 I-STAT (LACTIC ACID) - Abnormal; Notable for the following:    Lactic Acid, Venous 3.61 (*)  All other components within normal limits  POCT I-STAT 3, BLOOD GAS (G3P V) - Abnormal; Notable for the following:    pH, Ven 7.587 (*)     pCO2, Ven 34.2 (*)     pO2, Ven 53.0 (*)     Bicarbonate 32.6 (*)     Acid-Base Excess 10.0 (*)     All other components within normal limits  URINE MICROSCOPIC-ADD ON - Abnormal; Notable for the following:    Squamous Epithelial / LPF FEW (*)     All other components within normal limits  CG4 I-STAT (LACTIC ACID) - Abnormal; Notable for the following:    Lactic Acid, Venous 3.49 (*)     All other components within normal limits  POCT I-STAT TROPONIN I  HEMATOCRIT  PROTIME-INR   Dg Chest 2 View  04/13/2012  *RADIOLOGY REPORT*  Clinical Data: Constipation; recent history of pneumonia.  CHEST - 2 VIEW  Comparison: Chest radiograph performed 04/09/2012  Findings: The lungs are well-aerated.  Minimal bibasilar atelectasis is noted.  There is no evidence of pleural effusion or pneumothorax.  A calcified granuloma is noted at the right lung apex.  Mild chronically increased interstitial markings are seen.  The heart is borderline normal in size; calcification is noted in the aortic arch.  No acute osseous abnormalities are seen.  Mild degenerative change is noted at the right humeral head.  IMPRESSION: Minimal bibasilar atelectasis noted; mild chronic lung changes seen.   Original Report Authenticated By: Tonia Ghent, M.D.      No diagnosis found.    MDM  Patient is a well-appearing 76 year old African American male recently discharged from this hospital for pneumonia and urinary tract infection he finished his antibiotic  course. Patient presents with chief complaint 3 is constipation. He is passing flatus. A note family states patient been taking his medications. Patient states he just didn't feel like taking his meds. Patient alert and oriented x3. Well-appearing. Vital stable. Afebrile. Initial EKG shows tachycardia with variable conduction atrial flutter. Patient is on numerous cardiac medications including beta blockers. Meds ordered with improvement in heart rate. He is medically stable. Fecal impaction noted on rectal exam. Digital disimpaction attempted but the patient could not tolerate secondary to discomfort. Lactate 3.6. The etiology of this is unknown. No urinary tract infection. No evidence of pneumonia. Clinical patient is does not have pneumonia. Normal rectal temp. Patient is not on metformin. Possibly secondary to dehydration the patient's BNP is elevated. Small fluid bolus provided in the ED. Patient does not appear volume overloaded. Admit to the hospital for bowel regimen and treatment of his elevated lactate level and elevated BNP. Further complaints needed.        Consuello Masse, MD 04/13/12 [phone]

## 2012-04-13 NOTE — H&P (Signed)
[facility] ADMISSION NOTE  Hospital Admission Note Date: 04/13/2012  Patient name:  Vincent [name]  Medical record number:  [id number] Date of birth:  1936/02/07  Age: 76 y.o. Gender:  male PCP:    [name], MD  Medical Service: Internal Medicine     Chief Complaint: generalized weakness  History of Present Illness: Vincent [name] is a 77 y.o. male with past medical history of chronic systolic heart failure with an EF of 45%, also left atrial thrombus currently on Coumadin, also history of atrial fibrillation, discharged from the hospital 3 days ago when he was treated for Health care Pneumonia followed by inpatient rehab for deconditioning presents to the ER with generalized weakness and constipation. Patient has not been taking his medications since discharge from rehabilitation on December 28. The family was worried about him and wants him placed in a assisted living facility.  ED physician tried disimpaction but was unsuccessful. Constipation is a chronic problem for him and he is on home MiraLax as needed.  Patient complaint of increased frequency of urination which is not new for him as he is a survivor of prostate cancer.  Review of system was negative otherwise.   Current Outpatient Medications: Current Facility-Administered Medications  Medication Dose Route Frequency Provider Last Rate Last Dose  . Warfarin - Physician Dosing Inpatient   Does not apply [id number] Gwyneth Sprout, MD       Current Outpatient Prescriptions  Medication Sig Dispense Refill  . allopurinol (ZYLOPRIM) 100 MG tablet Take 1.5 tablets (150 mg total) by mouth every morning.  30 tablet  1  . atenolol (TENORMIN) 100 MG tablet Take 1 tablet (100 mg total) by mouth 2 (two) times daily.  60 tablet  1  . diltiazem (TIAZAC) 300 MG 24 hr capsule Take 1 capsule (300 mg total) by mouth every morning.  30 capsule  1  . furosemide (LASIX) 80 MG tablet Take 0.5 tablets (40 mg total) by mouth 2 (two) times  daily.  30 tablet  1  . insulin aspart protamine-insulin aspart (NOVOLOG 70/30) (70-30) 100 UNIT/ML injection Inject 20 Units into the skin 2 (two) times daily with a meal.      . polyethylene glycol (MIRALAX / GLYCOLAX) packet Take 17 g by mouth daily as needed. For constipation      . potassium chloride (K-DUR,KLOR-CON) 10 MEQ tablet Take 2 tablets (20 mEq total) by mouth 2 (two) times daily.  60 tablet  1  . warfarin (COUMADIN) 4 MG tablet Take 4 mg by mouth every evening.        Allergies: Review of patient's allergies indicates no known allergies.  Past Medical History: Past Medical History  Diagnosis Date  . Diabetes mellitus   . Hypertension   . CHF (congestive heart failure)   . Cancer   . Pneumonia   . Irregular heartbeat   . Prostate cancer   . Shortness of breath   . GERD (gastroesophageal reflux disease)   . Acute on chronic diastolic CHF (congestive heart failure) 12/18/2011  . Atrial fibrillation with rapid ventricular response, history of PAF was in SR in 08/2011 12/18/2011  . CKD (chronic kidney disease) stage 3, GFR 30-59 ml/min 12/26/2011    Past Surgical History: Past Surgical History  Procedure Date  . Prostate surgery   . Tee without cardioversion 12/27/2011    Procedure: TRANSESOPHAGEAL ECHOCARDIOGRAM (TEE);  Surgeon: Chrystie Nose, MD;  Location: [facility] OR;  Service: Cardiovascular;  Laterality: N/A;  MD request  Main OR  . Cardioversion 12/27/2011    Procedure: CARDIOVERSION;  Surgeon: Chrystie Nose, MD;  Location: [facility] OR;  Service: Cardiovascular;  Laterality: N/A;    Family History: Family History  Problem Relation Age of Onset  . Diabetes Mellitus II Mother   . Sudden death Mother   . Prostate cancer Father     Social History: History   Social History  . Marital Status: Single    Spouse Name: N/A    Number of Children: N/A  . Years of Education: N/A   Occupational History  . Not on file.   Social History Main Topics  . Smoking status: Never  Smoker   . Smokeless tobacco: Never Used  . Alcohol Use: No  . Drug Use: No  . Sexually Active: Not Currently   Other Topics Concern  . Not on file   Social History Narrative  . No narrative on file    Review of Systems: Pertinent items are noted in HPI.  Vital Signs: T: 97.5 P: 107 BP: 107/86 RR: 18 O2 sat: 95 % on RA   Physical Exam: General: Vital signs reviewed and noted. Well-developed, well-nourished, in no acute distress; alert, appropriate and cooperative throughout examination.  Head: Normocephalic, atraumatic.  Eyes: PERRL, EOMI, No signs of anemia or jaundince.  Nose: Mucous membranes moist, not inflammed, nonerythematous.  Throat: Oropharynx nonerythematous, no exudate appreciated.   Neck: No deformities, masses, or tenderness noted.Supple, No carotid Bruits, no JVD.  Lungs:  Normal respiratory effort. Clear to auscultation BL without crackles or wheezes.  Heart: RRR. S1 and S2 normal without gallop, murmur, or rubs.  Abdomen:  BS normoactive. Soft, Nondistended, non-tender.  No masses or organomegaly.  Extremities: No pretibial edema.  Neurologic: A&O X3, CN II - XII are grossly intact. Motor strength is 5/5 in the all 4 extremities, Sensations intact to light touch, Cerebellar signs negative.  Skin: No visible rashes, scars.   Lab results: CBC:    Component Value Date/Time   WBC 15.9* 04/13/2012 1746   HGB 14.0 04/13/2012 1746   HCT 44.1 04/13/2012 1827   PLT 234 04/13/2012 1746   MCV 91.6 04/13/2012 1746   NEUTROABS 11.9* 04/13/2012 1746   LYMPHSABS 2.1 04/13/2012 1746   MONOABS 1.7* 04/13/2012 1746   EOSABS 0.2 04/13/2012 1746   BASOSABS 0.0 04/13/2012 1746      Comprehensive Metabolic Panel:    Component Value Date/Time   NA 138 04/13/2012 1746   K 4.1 04/13/2012 1746   CL 96 04/13/2012 1746   CO2 28 04/13/2012 1746   BUN 43* 04/13/2012 1746   CREATININE 1.46* 04/13/2012 1746   GLUCOSE 336* 04/13/2012 1746   CALCIUM 9.4 04/13/2012 1746    AST 49* 04/13/2012 1827   ALT 58* 04/13/2012 1827   ALKPHOS 127* 04/13/2012 1827   BILITOT 0.6 04/13/2012 1827   PROT 6.9 04/13/2012 1827   ALBUMIN 3.2* 04/13/2012 1827     Lab Results  Component Value Date   CKTOTAL 82 01/09/2012   CKMB 3.2 01/09/2012   TROPONINI <0.30 03/31/2012      Imaging results: CXR:  Minimal bibasilar atelectasis noted; mild chronic lung changes seen.    Assessment & Plan:  76 y/o gentleman with PMH significant for HTN, Type 2 DM, CKD stage III who was recently d/c from teh hospital about 3 days ago following a short term rehab following HCAP and probable flu( got treated with tamiflu) presents to the Er with generalized weakness. Patient is essentially  here for placement issue as family is unable to take care of him at home.  Generalized weakness: Patient presents with generalized weakness likely secondary to deconditioning in the setting of poor oral intake. His UA is clear. His CXR shows mild bibasilar atelectasis. Troponin's were negative. He denies any fall.  Admit to med- surg bed. PT/OT consult in AM. Check orthostatics IVF overnight and then reassess  Leucocytosis: dehydration vs stress de margination. Also he recently finished his course of steroids for treatment of acute respiratory distress likely from pneumonia during recent hospitalization.  Hydrate with IVF.  CKD stage III: his baseline Cr~1.53. He presented with Cr of 1.4. Continue to monitor. Likely dehydration.  Type 2 DM: His last AIC was 9.1 in 12/13.  Restart home dose of insulin Sliding scale insulin sensitive  Chronic systolic CHF: His EF is 45% from echo in 09/13. His Pro- BNP is elevated to 6000's. He has no signs of fluid overload on exam.  Hold Lasix for now Reassess in a.m. for need of Lasix versus IV fluids  Atrial fibrillation: HR in 100's. Continue atenolol and cardizem   Chronic anticoagulation for left atrial thrombus: Continue coumadin per pharmacy  Normocytic  Anemia: His baseline Hb~10. 5-10.7. He presented with Hb of 14 ( hemoconcentration) likely 2/2 poor oral intake and dehydration.   DVT PPX: coumadin  Code: Full  Family communication: Patient and family at the bedside were updated on the plan of care. Dispo: likely to SNF.       Lars Mage, M.D. 04/13/2012, 10:27 PM

## 2012-04-13 NOTE — ED Provider Notes (Signed)
I saw and evaluated the patient, reviewed the resident's note and I agree with the findings and plan. Patient returning due to weakness and fecal impaction. Since being discharged from the hospital he has not taken any of his normal medications. Patient is impacted on exam but now has an elevated lactate and signs of CHF. Will admit for further care.  Gwyneth Sprout, MD 04/13/12 2350

## 2012-04-13 NOTE — ED Notes (Signed)
Pt here after being discharged from upstairs with PNA, and the flu. Pt just sent home on Saturday and now is still feeling bad. Pt with increased weakness and states that he is constipated. Daughter thinks that he has not been taking his home medications like he should. Also reports his CBG was high today when home health RN came out.

## 2012-04-14 ENCOUNTER — Encounter (HOSPITAL_COMMUNITY): Payer: Self-pay | Admitting: General Practice

## 2012-04-14 DIAGNOSIS — R531 Weakness: Secondary | ICD-10-CM | POA: Diagnosis present

## 2012-04-14 DIAGNOSIS — K5900 Constipation, unspecified: Secondary | ICD-10-CM | POA: Diagnosis present

## 2012-04-14 LAB — CBC
MCH: 30 pg (ref 26.0–34.0)
MCHC: 32.6 g/dL (ref 30.0–36.0)
Platelets: 219 10*3/uL (ref 150–400)
RBC: 4.73 MIL/uL (ref 4.22–5.81)
RDW: 16.9 % — ABNORMAL HIGH (ref 11.5–15.5)

## 2012-04-14 LAB — GLUCOSE, CAPILLARY: Glucose-Capillary: 165 mg/dL — ABNORMAL HIGH (ref 70–99)

## 2012-04-14 LAB — BASIC METABOLIC PANEL
Calcium: 9.2 mg/dL (ref 8.4–10.5)
Creatinine, Ser: 1.33 mg/dL (ref 0.50–1.35)
GFR calc Af Amer: 59 mL/min — ABNORMAL LOW (ref >90)
GFR calc non Af Amer: 51 mL/min — ABNORMAL LOW (ref >90)
Sodium: 138 mEq/L (ref 135–145)

## 2012-04-14 LAB — PROTIME-INR
INR: 1.7 — ABNORMAL HIGH (ref 0.00–1.49)
Prothrombin Time: 19.4 seconds — ABNORMAL HIGH (ref 11.6–15.2)

## 2012-04-14 MED ORDER — MAGNESIUM CITRATE PO SOLN
1.0000 | Freq: Once | ORAL | Status: AC
  Administered 2012-04-14: 1 via ORAL
  Filled 2012-04-14: qty 296

## 2012-04-14 MED ORDER — INSULIN ASPART PROT & ASPART (70-30 MIX) 100 UNIT/ML ~~LOC~~ SUSP
20.0000 [IU] | Freq: Two times a day (BID) | SUBCUTANEOUS | Status: DC
  Administered 2012-04-14 – 2012-04-15 (×3): 20 [IU] via SUBCUTANEOUS
  Filled 2012-04-14: qty 3

## 2012-04-14 MED ORDER — ACETAMINOPHEN 325 MG PO TABS
650.0000 mg | ORAL_TABLET | Freq: Four times a day (QID) | ORAL | Status: DC | PRN
  Administered 2012-04-17: 650 mg via ORAL
  Filled 2012-04-14: qty 2

## 2012-04-14 MED ORDER — ONDANSETRON HCL 4 MG PO TABS: 4.0000 mg | ORAL_TABLET | Freq: Four times a day (QID) | ORAL | Status: DC | PRN

## 2012-04-14 MED ORDER — POTASSIUM CHLORIDE CRYS ER 20 MEQ PO TBCR
20.0000 meq | EXTENDED_RELEASE_TABLET | Freq: Two times a day (BID) | ORAL | Status: DC
  Administered 2012-04-14 – 2012-04-18 (×9): 20 meq via ORAL
  Filled 2012-04-14 (×10): qty 1

## 2012-04-14 MED ORDER — POLYETHYLENE GLYCOL 3350 17 G PO PACK
17.0000 g | PACK | Freq: Every day | ORAL | Status: DC
  Administered 2012-04-14 – 2012-04-18 (×2): 17 g via ORAL
  Filled 2012-04-14 (×5): qty 1

## 2012-04-14 MED ORDER — INSULIN ASPART 100 UNIT/ML ~~LOC~~ SOLN
0.0000 [IU] | Freq: Three times a day (TID) | SUBCUTANEOUS | Status: DC
  Administered 2012-04-14: 2 [IU] via SUBCUTANEOUS
  Administered 2012-04-14: 5 [IU] via SUBCUTANEOUS
  Administered 2012-04-14 – 2012-04-17 (×7): 2 [IU] via SUBCUTANEOUS
  Administered 2012-04-17 – 2012-04-18 (×2): 1 [IU] via SUBCUTANEOUS
  Administered 2012-04-18: 2 [IU] via SUBCUTANEOUS

## 2012-04-14 MED ORDER — WARFARIN - PHARMACIST DOSING INPATIENT
Freq: Every day | Status: DC
  Administered 2012-04-14: 18:00:00

## 2012-04-14 MED ORDER — WARFARIN SODIUM 6 MG PO TABS
6.0000 mg | ORAL_TABLET | Freq: Once | ORAL | Status: AC
  Administered 2012-04-14: 6 mg via ORAL
  Filled 2012-04-14: qty 1

## 2012-04-14 MED ORDER — ONDANSETRON HCL 4 MG/2ML IJ SOLN: 4.0000 mg | Freq: Four times a day (QID) | INTRAMUSCULAR | Status: DC | PRN

## 2012-04-14 MED ORDER — SODIUM CHLORIDE 0.9 % IV SOLN
INTRAVENOUS | Status: DC
  Administered 2012-04-14 (×2): via INTRAVENOUS

## 2012-04-14 MED ORDER — SENNA 8.6 MG PO TABS
1.0000 | ORAL_TABLET | Freq: Two times a day (BID) | ORAL | Status: DC
  Administered 2012-04-14: 8.6 mg via ORAL
  Filled 2012-04-14 (×2): qty 1

## 2012-04-14 MED ORDER — ACETAMINOPHEN 650 MG RE SUPP: 650.0000 mg | Freq: Four times a day (QID) | RECTAL | Status: DC | PRN

## 2012-04-14 MED ORDER — MAGNESIUM CITRATE PO SOLN: 1.0000 | Freq: Once | ORAL | Status: DC | PRN

## 2012-04-14 MED ORDER — FUROSEMIDE 40 MG PO TABS
40.0000 mg | ORAL_TABLET | Freq: Two times a day (BID) | ORAL | Status: DC
  Administered 2012-04-14 – 2012-04-15 (×2): 40 mg via ORAL
  Filled 2012-04-14 (×4): qty 1

## 2012-04-14 MED ORDER — DOCUSATE SODIUM 100 MG PO CAPS
100.0000 mg | ORAL_CAPSULE | Freq: Two times a day (BID) | ORAL | Status: DC
Start: 2012-04-14 — End: 2012-04-14
  Administered 2012-04-14: 100 mg via ORAL
  Filled 2012-04-14 (×2): qty 1

## 2012-04-14 NOTE — Progress Notes (Signed)
Patient HR  @40 -50's. Patient is asymptomatic; no complaints; not in distress and was sleeping when initally assessed. MD made aware.

## 2012-04-14 NOTE — Progress Notes (Signed)
Patient referred for SNF placement- short term.  Wants [facility] if possible.  Fl2 will be completed and SNF search initiated.  Lorri Frederick. [place]  [phone]

## 2012-04-14 NOTE — Progress Notes (Signed)
UR Chart Review Completed  

## 2012-04-14 NOTE — Progress Notes (Signed)
ANTICOAGULATION CONSULT NOTE - Initial Consult  Pharmacy Consult for Coumadin Indication: h/o Afib/LA thrombus  No Known Allergies  Patient Measurements: Height: 5' 8.9" (175 cm) Weight: 226 lb 14.4 oz (102.921 kg) (scale B) IBW/kg (Calculated) : 70.47   Vital Signs: Temp: 98.5 F (36.9 C) (12/30 2325) Temp src: Oral (12/30 2325) BP: 147/84 mmHg (12/30 2325) Pulse Rate: 86  (12/30 2325)  Labs:  Basename 04/14/12 0130 04/14/12 0129 04/13/12 1827 04/13/12 1746 04/11/12 0805  HGB 14.2 -- -- 14.0 --  HCT 43.6 -- 44.1 43.9 --  PLT 219 -- -- 234 --  APTT -- -- -- -- --  LABPROT -- 19.4* -- -- 24.2*  INR -- 1.70* -- -- 2.29*  HEPARINUNFRC -- -- -- -- --  CREATININE 1.33 -- -- 1.46* --  CKTOTAL -- -- -- -- --  CKMB -- -- -- -- --  TROPONINI -- -- -- -- --    Estimated Creatinine Clearance: 57.6 ml/min (by C-G formula based on Cr of 1.33).   Medical History: Past Medical History  Diagnosis Date  . Hypertension   . CHF (congestive heart failure)   . Irregular heartbeat   . Shortness of breath   . GERD (gastroesophageal reflux disease)   . Acute on chronic diastolic CHF (congestive heart failure) 12/18/2011  . Atrial fibrillation with rapid ventricular response, history of PAF was in SR in 08/2011 12/18/2011  . Fecal impaction 04/13/2012  . Left atrial thrombus     Hattie Perch 04/13/2012  . Type II diabetes mellitus   . CKD (chronic kidney disease) stage 3, GFR 30-59 ml/min 12/26/2011  . Pneumonia 03/31/2012; 04/13/2012    Healthcare-associated pneumonia/notes 04/13/2012  . Prostate cancer     Medications:  Prescriptions prior to admission  Medication Sig Dispense Refill  . allopurinol (ZYLOPRIM) 100 MG tablet Take 1.5 tablets (150 mg total) by mouth every morning.  30 tablet  1  . atenolol (TENORMIN) 100 MG tablet Take 1 tablet (100 mg total) by mouth 2 (two) times daily.  60 tablet  1  . diltiazem (TIAZAC) 300 MG 24 hr capsule Take 1 capsule (300 mg total) by mouth every  morning.  30 capsule  1  . furosemide (LASIX) 80 MG tablet Take 0.5 tablets (40 mg total) by mouth 2 (two) times daily.  30 tablet  1  . insulin aspart protamine-insulin aspart (NOVOLOG 70/30) (70-30) 100 UNIT/ML injection Inject 20 Units into the skin 2 (two) times daily with a meal.      . polyethylene glycol (MIRALAX / GLYCOLAX) packet Take 17 g by mouth daily as needed. For constipation      . potassium chloride (K-DUR,KLOR-CON) 10 MEQ tablet Take 2 tablets (20 mEq total) by mouth 2 (two) times daily.  60 tablet  1  . warfarin (COUMADIN) 4 MG tablet Take 4 mg by mouth every evening.        Assessment: 76 yo male with h/o LA thrombus for anticoagulation  Goal of Therapy:  INR 2-3 Monitor platelets by anticoagulation protocol: Yes   Plan:  Coumadin 6 mg po tonight Daily INR  Gwenda Heiner, Gary Fleet 04/14/2012,2:40 AM

## 2012-04-14 NOTE — Progress Notes (Signed)
[facility]  Patient Status: Active (receiving services up to time of hospitalization)  [facility] is providing the following services: RN, PT and OT  If patient discharges after hours, please call [phone].   Vincent [name] 04/14/2012, 2:35 PM

## 2012-04-14 NOTE — Progress Notes (Signed)
Patient admitted after midnight. Feels still constipated. Disposition needs to be clarified. Social worker note reports that skilled nursing facility is being arranged, but physical therapy recommends home PT. Might require assisted living. No acute medical issues.

## 2012-04-14 NOTE — Progress Notes (Signed)
PT recommendation for home with [facility] noted.  CSW will need to talk to family as they were requesting short term SNF for rehab. Will need to discuss further with patient and family to determine their decision. ALF is rarely sought for short term "rehab".  Message has been left for pt's daughter regarding above.   Lorri Frederick. [place]  [phone]

## 2012-04-14 NOTE — Evaluation (Signed)
Physical Therapy Evaluation Patient Details Name: Vincent [name] MRN: [id number] DOB: 29-May-1935 Today's Date: 04/14/2012 Time: 1191-4782 PT Time Calculation (min): 29 min  PT Assessment / Plan / Recommendation Clinical Impression  Pt is a 76 y/o male admitted for constipation and generalized weakness.  Pt lives at home with his wife and plans to return to home when discharged.  Pt will benefit from PT in acute setting to address activity tolerance and functional mobility.       PT Assessment  Patient needs continued PT services    Follow Up Recommendations  Home health PT    Does the patient have the potential to tolerate intense rehabilitation      Barriers to Discharge Decreased caregiver support      Equipment Recommendations  None recommended by PT    Recommendations for Other Services     Frequency Min 3X/week    Precautions / Restrictions Precautions Precautions: None Restrictions Weight Bearing Restrictions: No   Pertinent Vitals/Pain No c/o pain.       Mobility  Bed Mobility Bed Mobility: Supine to Sit;Sit to Supine Supine to Sit: 4: Min assist;HOB elevated Sit to Supine: 5: Supervision;HOB flat Details for Bed Mobility Assistance: Pt required use of rail and pulling on PT's hand to raise shoulders from the bed.  Transfers Transfers: Sit to Stand;Stand to Sit Sit to Stand: 5: Supervision;From bed;With upper extremity assist;4: Min assist;From toilet Stand to Sit: 6: Modified independent (Device/Increase time);With armrests;To bed;To chair/3-in-1;With upper extremity assist Details for Transfer Assistance: Min assist to stand from low toilet seat secondary to bilateral LE weakness.   Ambulation/Gait Ambulation/Gait Assistance: 6: Modified independent (Device/Increase time) Ambulation Distance (Feet): 100 Feet Assistive device: Rolling walker Ambulation/Gait Assistance Details: slow pace.  pt fatigues quickly.  Gait Pattern: Step-through pattern Gait  velocity: decreased Stairs: No Wheelchair Mobility Wheelchair Mobility: No    Shoulder Instructions     Exercises     PT Diagnosis: Generalized weakness  PT Problem List: Decreased strength;Decreased activity tolerance;Decreased balance;Decreased mobility;Decreased safety awareness;Decreased knowledge of use of DME;Decreased knowledge of precautions PT Treatment Interventions: DME instruction;Gait training;Functional mobility training;Therapeutic activities;Patient/family education   PT Goals Acute Rehab PT Goals PT Goal Formulation: With patient Time For Goal Achievement: 04/16/12 Potential to Achieve Goals: Good Pt will go Supine/Side to Sit: Independently PT Goal: Supine/Side to Sit - Progress: Goal set today Pt will go Sit to Supine/Side: Independently;with HOB 0 degrees PT Goal: Sit to Supine/Side - Progress: Goal set today Pt will go Sit to Stand: with modified independence PT Goal: Sit to Stand - Progress: Goal set today Pt will go Stand to Sit: with modified independence PT Goal: Stand to Sit - Progress: Goal set today Pt will Ambulate: 51 - 150 feet;with supervision;with rolling walker PT Goal: Ambulate - Progress: Goal set today  Visit Information  Last PT Received On: 04/14/12    Subjective Data  Subjective: I will be alright when I get this constipation straightened out.  Patient Stated Goal: agreed to getting up.   Prior Functioning  Home Living Lives With: Spouse Available Help at Discharge: Family;Other (Comment) Type of Home: House Home Access: Stairs to enter;Level entry;Ramped entrance;Other (comment) Entrance Stairs-Number of Steps: 3 Entrance Stairs-Rails: None Home Layout: Two level;Able to live on main level with bedroom/bathroom Alternate Level Stairs-Number of Steps: Patient does not use upstairs Bathroom Shower/Tub: Tub/shower unit;Walk-in shower Bathroom Toilet: Handicapped height Bathroom Accessibility: Yes How Accessible: Accessible via  walker Home Adaptive Equipment: Walker - rolling;Tub transfer  bench;Walker - four wheeled;Straight cane Prior Function Level of Independence: Independent with assistive device(s) Able to Take Stairs?: No Driving: No Vocation: Retired Musician: No difficulties Dominant Hand: Right    Cognition  Overall Cognitive Status: Appears within functional limits for tasks assessed/performed Arousal/Alertness: Awake/alert Orientation Level: Appears intact for tasks assessed Behavior During Session: [facility] for tasks performed    Extremity/Trunk Assessment Left Upper Extremity Assessment LUE ROM/Strength/Tone: Within functional levels Right Lower Extremity Assessment RLE ROM/Strength/Tone: Deficits RLE ROM/Strength/Tone Deficits: Bilateral LE weakness.  At least 4/5 grossly required assistance to stand from low surfaces.  RLE Sensation: History of peripheral neuropathy Left Lower Extremity Assessment LLE ROM/Strength/Tone: Deficits LLE ROM/Strength/Tone Deficits: same as R .  LLE Sensation: History of peripheral neuropathy   Balance    End of Session PT - End of Session Equipment Utilized During Treatment: Gait belt Activity Tolerance: Patient limited by fatigue;Treatment limited secondary to medical complications (Comment) (Abdominal discomfort from constipation. ) Patient left: in bed;with call bell/phone within reach;with bed alarm set Nurse Communication: Mobility status  GP     Kiyonna Tortorelli 04/14/2012, 10:07 AM  Theron Arista L. Laquanna Veazey DPT [phone]

## 2012-04-15 ENCOUNTER — Encounter (HOSPITAL_COMMUNITY): Payer: Self-pay | Admitting: Cardiology

## 2012-04-15 DIAGNOSIS — K5641 Fecal impaction: Secondary | ICD-10-CM

## 2012-04-15 DIAGNOSIS — I5022 Chronic systolic (congestive) heart failure: Secondary | ICD-10-CM

## 2012-04-15 DIAGNOSIS — I5033 Acute on chronic diastolic (congestive) heart failure: Secondary | ICD-10-CM

## 2012-04-15 DIAGNOSIS — I509 Heart failure, unspecified: Secondary | ICD-10-CM

## 2012-04-15 DIAGNOSIS — I480 Paroxysmal atrial fibrillation: Secondary | ICD-10-CM

## 2012-04-15 DIAGNOSIS — I4891 Unspecified atrial fibrillation: Secondary | ICD-10-CM

## 2012-04-15 HISTORY — DX: Paroxysmal atrial fibrillation: I48.0

## 2012-04-15 LAB — PROTIME-INR
INR: 1.66 — ABNORMAL HIGH (ref 0.00–1.49)
Prothrombin Time: 19.1 seconds — ABNORMAL HIGH (ref 11.6–15.2)

## 2012-04-15 LAB — GLUCOSE, CAPILLARY
Glucose-Capillary: 272 mg/dL — ABNORMAL HIGH (ref 70–99)
Glucose-Capillary: 100 mg/dL — ABNORMAL HIGH (ref 70–99)

## 2012-04-15 MED ORDER — INSULIN ASPART PROT & ASPART (70-30 MIX) 100 UNIT/ML ~~LOC~~ SUSP
20.0000 [IU] | Freq: Two times a day (BID) | SUBCUTANEOUS | Status: DC
  Administered 2012-04-15 – 2012-04-18 (×6): 20 [IU] via SUBCUTANEOUS
  Filled 2012-04-15: qty 3

## 2012-04-15 MED ORDER — FUROSEMIDE 40 MG PO TABS
40.0000 mg | ORAL_TABLET | Freq: Two times a day (BID) | ORAL | Status: DC
  Administered 2012-04-16 – 2012-04-18 (×5): 40 mg via ORAL
  Filled 2012-04-15 (×7): qty 1

## 2012-04-15 MED ORDER — METOPROLOL TARTRATE 25 MG PO TABS
25.0000 mg | ORAL_TABLET | Freq: Two times a day (BID) | ORAL | Status: DC
  Administered 2012-04-15 – 2012-04-16 (×3): 25 mg via ORAL
  Filled 2012-04-15 (×4): qty 1

## 2012-04-15 MED ORDER — WARFARIN SODIUM 6 MG PO TABS
6.0000 mg | ORAL_TABLET | Freq: Once | ORAL | Status: AC
  Administered 2012-04-15: 6 mg via ORAL
  Filled 2012-04-15: qty 1

## 2012-04-15 MED ORDER — INSULIN ASPART PROT & ASPART (70-30 MIX) 100 UNIT/ML ~~LOC~~ SUSP: 25.0000 [IU] | Freq: Two times a day (BID) | SUBCUTANEOUS | Status: DC

## 2012-04-15 MED ORDER — ENOXAPARIN SODIUM 150 MG/ML ~~LOC~~ SOLN
150.0000 mg | SUBCUTANEOUS | Status: DC
  Administered 2012-04-15 – 2012-04-16 (×2): 150 mg via SUBCUTANEOUS
  Filled 2012-04-15 (×3): qty 1

## 2012-04-15 NOTE — Progress Notes (Signed)
[facility]                                                                                Patient Demographics  Vincent [name], is a 77 y.o. male  ZOX:[id number]  WJX:[id number]  DOB - 07-08-1935  Admit date - 04/13/2012  Admitting Physician Lars Mage, MD  Outpatient Primary MD for the patient is [name], MD  LOS - 2   Chief Complaint  Patient presents with  . Constipation        Assessment & Plan    1. Generalized weakness due to deconditioning and poor oral intake. Chest x-ray and UA are clear, patient is afebrile, does have nonspecific leukocytosis, currently feeling better after gentle hydration overnight, will skip today's second dose of Lasix, increase activity, PT to evaluate along with social worker for placement. No focal neurological signs.   2. History of atrial fibrillation, had some sinus pauses upon admission, beta blocker dose has been cut down, case was discussed with Dr. Herbie Baltimore cardiologist, currently no pauses, will monitor on telemetry. TSH was 1.6.   3. Constipation. Relieved after bowel regimen continue.   4. Chronic kidney disease stage III creatinine is around his baseline which is around 1.5.   5. History of LV thrombus. Patient is chronic Coumadin however INR is subtherapeutic, pharmacy has been consulted for Lovenox bridging till INR is 2.    6. Diabetes mellitus type 2. Continue sliding scale insulin, along with 70/30 scheduled.  CBG (last 3)   Basename 04/15/12 1131 04/15/12 0626 04/14/12 2159  GLUCAP 100* 92 122*     7. Leukocytosis question if this is due to recent exposure to steroids, he is afebrile, chest x-ray UA stable, will monitor trend.     Code Status: Full  Family Communication: With the patient  Disposition Plan: To be decided   Procedures none   Consults  discussed with cardiologist Dr. Herbie Baltimore   DVT Prophylaxis  Lovenox - and Coumadin  Lab Results  Component Value Date   PLT  219 04/14/2012    Medications  Scheduled Meds:   . furosemide  40 mg Oral BID  . insulin aspart  0-9 Units Subcutaneous TID WC  . insulin aspart protamine-insulin aspart  20 Units Subcutaneous BID WC  . metoprolol tartrate  25 mg Oral BID  . polyethylene glycol  17 g Oral Daily  . potassium chloride  20 mEq Oral BID  . Warfarin - Pharmacist Dosing Inpatient   Does not apply q1800   Continuous Infusions:  PRN Meds:.acetaminophen, ondansetron (ZOFRAN) IV  Antibiotics    Anti-infectives    None       Time Spent in minutes   35   Agusta Hackenberg K M.D on 04/15/2012 at 12:29 PM  Between 7am to 7pm - Pager - [phone]  After 7pm go to [email or web address] - password [email or web address]  And look for the night coverage person covering for me after hours  [facility] Office  [phone]    Subjective:   [name] today has, No headache, No chest pain, No abdominal pain - No Nausea, No new weakness tingling or numbness, No Cough - SOB. Improved generalized weakness.  Objective:  Filed Vitals:   04/14/12 1505 04/14/12 2218 04/15/12 0521 04/15/12 1051  BP: 111/64 130/69 135/80 146/87  Pulse: 90 78 100 87  Temp: 97.5 F (36.4 C) 97.8 F (36.6 C) 97.1 F (36.2 C)   TempSrc: Oral Oral Oral   Resp: 18 18 20    Height:      Weight:   105.461 kg (232 lb 8 oz)   SpO2: 95% 99% 96%     Wt Readings from Last 3 Encounters:  04/15/12 105.461 kg (232 lb 8 oz)  04/11/12 105 kg (231 lb 7.7 oz)  04/06/12 116.1 kg (255 lb 15.3 oz)     Intake/Output Summary (Last 24 hours) at 04/15/12 1229 Last data filed at 04/15/12 0900  Gross per 24 hour  Intake    780 ml  Output    676 ml  Net    104 ml    Exam Awake Alert, Oriented X 3, No new F.N deficits, Normal affect [place].AT,PERRAL Supple Neck,No JVD, No cervical lymphadenopathy appriciated.  Symmetrical Chest wall movement, Good air movement bilaterally, CTAB RRR,No Gallops,Rubs or new Murmurs, No Parasternal Heave +ve B.Sounds,  Abd Soft, Non tender, No organomegaly appriciated, No rebound - guarding or rigidity. No Cyanosis, Clubbing or edema, No new Rash or bruise      Data Review   Micro Results No results found for this or any previous visit (from the past 240 hour(s)).  Radiology Reports Dg Chest 2 View  04/13/2012  *RADIOLOGY REPORT*  Clinical Data: Constipation; recent history of pneumonia.  CHEST - 2 VIEW  Comparison: Chest radiograph performed 04/09/2012  Findings: The lungs are well-aerated.  Minimal bibasilar atelectasis is noted.  There is no evidence of pleural effusion or pneumothorax.  A calcified granuloma is noted at the right lung apex.  Mild chronically increased interstitial markings are seen.  The heart is borderline normal in size; calcification is noted in the aortic arch.  No acute osseous abnormalities are seen.  Mild degenerative change is noted at the right humeral head.  IMPRESSION: Minimal bibasilar atelectasis noted; mild chronic lung changes seen.   Original Report Authenticated By: Tonia Ghent, M.D.    Dg Chest 2 View  04/09/2012  *RADIOLOGY REPORT*  Clinical Data:  Pneumonia, follow-up, finishing antibiotics  CHEST - 2 VIEW  Comparison: 04/03/2012  Findings: Upper normal heart size. Atherosclerotic calcification aorta. Mediastinal contours and pulmonary vascularity normal. Bronchitic changes with decrease in perihilar markings versus previous study. Calcified granuloma right upper lobe. No acute infiltrate, pleural effusion or pneumothorax. Multilevel endplate spur formation thoracic spine.  IMPRESSION: Bronchitic changes with decreased accentuation perihilar markings versus previous study suggesting improved infiltrate.   Original Report Authenticated By: Ulyses Southward, M.D.    Dg Chest 2 View  03/31/2012  *RADIOLOGY REPORT*  Clinical Data: Shortness of breath, vomiting, altered mental status  CHEST - 2 VIEW  Comparison: 02/09/2012  Findings: Chronic interstitial markings.  No focal  consolidation. No pleural effusion or pneumothorax.  Stable mild cardiomegaly.  Degenerative changes of the visualized thoracolumbar spine.  IMPRESSION: No evidence of acute cardiopulmonary disease.   Original Report Authenticated By: Charline Bills, M.D.    Dg Chest Port 1 View  04/03/2012  *RADIOLOGY REPORT*  Clinical Data: Hospital acquired pneumonia.  PORTABLE CHEST - 1 VIEW  Comparison: 03/31/2012  Findings: Single view of the chest was obtained.  Stable calcified granuloma in the right upper lung.  There are coarse lung markings suggesting chronic changes.  There are vague densities in the right  upper lung and difficult to exclude subtle airspace disease.  Heart size is stable within normal limits.  No evidence for a pneumothorax.  IMPRESSION: Chronic lung changes.  Difficult to exclude subtle acute disease in the right lung.   Original Report Authenticated By: Richarda Overlie, M.D.     CBC  Lab 04/14/12 0130 04/13/12 1827 04/13/12 1746  WBC 16.0* -- 15.9*  HGB 14.2 -- 14.0  HCT 43.6 44.1 43.9  PLT 219 -- 234  MCV 92.2 -- 91.6  MCH 30.0 -- 29.2  MCHC 32.6 -- 31.9  RDW 16.9* -- 16.9*  LYMPHSABS -- -- 2.1  MONOABS -- -- 1.7*  EOSABS -- -- 0.2  BASOSABS -- -- 0.0  BANDABS -- -- --    Chemistries   Lab 04/14/12 0130 04/13/12 1827 04/13/12 1746  NA 138 -- 138  K 3.7 -- 4.1  CL 98 -- 96  CO2 29 -- 28  GLUCOSE 176* -- 336*  BUN 37* -- 43*  CREATININE 1.33 -- 1.46*  CALCIUM 9.2 -- 9.4  MG -- -- --  AST -- 49* --  ALT -- 58* --  ALKPHOS -- 127* --  BILITOT -- 0.6 --   ------------------------------------------------------------------------------------------------------------------ estimated creatinine clearance is 58.2 ml/min (by C-G formula based on Cr of 1.33). ------------------------------------------------------------------------------------------------------------------ No results found for this basename: HGBA1C:2 in the last 72  hours ------------------------------------------------------------------------------------------------------------------ No results found for this basename: CHOL:2,HDL:2,LDLCALC:2,TRIG:2,CHOLHDL:2,LDLDIRECT:2 in the last 72 hours ------------------------------------------------------------------------------------------------------------------  [facility] 04/14/12 0129  TSH 1.659  T4TOTAL --  T3FREE --  THYROIDAB --   ------------------------------------------------------------------------------------------------------------------ No results found for this basename: VITAMINB12:2,FOLATE:2,FERRITIN:2,TIBC:2,IRON:2,RETICCTPCT:2 in the last 72 hours  Coagulation profile  Lab 04/15/12 0455 04/14/12 0129 04/11/12 0805 04/10/12 1000 04/09/12 0730  INR 1.66* 1.70* 2.29* 3.12* 3.67*  PROTIME -- -- -- -- --    No results found for this basename: DDIMER:2 in the last 72 hours  Cardiac Enzymes No results found for this basename: CK:3,CKMB:3,TROPONINI:3,MYOGLOBIN:3 in the last 168 hours ------------------------------------------------------------------------------------------------------------------ No components found with this basename: POCBNP:3

## 2012-04-15 NOTE — Progress Notes (Signed)
I discussed the case with Dr. Thedore Mins.  H/o Afib RVR -- Unable to perform DCCV after TEE demonstrated in Sept due to LAA Thrombus.    Admitted with "Generalized Weakness".  No active SSx of CHF.  Had several ~3 Sec pauses upon arrival, so 100mg  Atenolol held.  Now mild RVR noted, but has had HR as low as 30s.  Needs Warfarin dose adjusted for therapeutic INR given h/o LAA thrombus.  Rx dose of Enoxaparin until therapeutic.  Apparently unable to afford Xarelto following Dr. Blanchie Dessert recommendation after TEE.  Would restart BB - using Metoprolol @ 25 mg BID while monitoring HR -- if pauses or significant Bradycardia recur, will likely need PPM placement for TachyBrady Syndrome.    Will follow along.  Formal C/s if requested by [facility] Dr. Thedore Mins.Marland Kitchen   Marykay Lex, M.D., M.S. [facility] [address] [place], [place]  [address]  [phone] Pager # [phone] 04/15/2012 9:46 AM

## 2012-04-15 NOTE — Consult Note (Signed)
[facility]  CARDIOLOGY CONSULTATION NOTE.  NAME:  Vincent [name]   MRN: [id number] DOB:  1935-06-12   ADMIT DATE: 04/13/2012  Reason for Consult: History of PAF - now with mild Afib RVR after pauses/bradycardia - ? TachyBrady  Requesting Physician: Susa Raring  Primary Cardiologist: Hilty, Kenneth C. ([place]), MD  HPI: This is a 78 y.o. male with a past medical history significant for Mild Systolic Dysfunction (EF ~40-45%) along with Diastolic Dysfunction along with PAF (on warfarin, followed @ [facility], H/O LAA thrombus on TEE 12/2011 - so no DCCV).  He was seen by Dr. Rennis Golden on 02/07/12 -- with worsening CHF symptoms, due to medical non-adherence, not taking Lasix.  He was instructed to take increased doses PO for several days, then return to the original planned dose.  He was then supposed to f/u with the [facility] pharmacist Phillips Hay) for INR monitoring & HF monitoring (unfortunately, her notes are not currently available). Dr. Rennis Golden was planning to see him on December 30, however that was a day he was admitted for recurrent complaint.   The original plan was to convert to Xarelto -- not done for monetary reasons.  He was admitted on December 17 for complaints of nausea vomiting and cough. He was noted to have a low-grade fever and shortness of breath, with a questionable pneumonia on chest x-ray. He was placed on antibiotics as well as Tamiflu along with steroid taper. During this time his INR was borderline therapeutic with ranges in about 1.8-2.0. He went a for a short-term to rehabilitation due to deconditioning -- on diltiazem 300 mg and atenolol 100 mg twice a day for rate control. This is in addition to 40 mg Lasix twice a day.  He was discharged from rehabilitation on April 11, 2012.   he was readmitted on December 30, for what seems like fecal impaction and generalized weakness. Per H&P there was concern the patient would require assisted living facility  placement. He underwent unsuccessful fecal disimpaction in the emergency room, but is currently having bowel movements. Upon arrival he was mildly tachycardic with heart rates in the 100s, However there were reported walls is noted On telemetry 3 seconds as well as a reporter bradycardia in the 30s, we have not been able to see this on monitor. Despite his tachycardia it he was not placed on atenolol or diltiazem, and has had heart rates in the 140s overnight last night.  I was asked by Dr. Thedore Mins to consult for rate control management. Also his INR continues to be subtherapeutic.  Cardiovascular ROS: positive for - irregular heartbeat and Occasional shortness of breath and mild swelling negative for - chest pain, dyspnea on exertion, orthopnea or paroxysmal nocturnal dyspnea  Past Medical History  Diagnosis Date  . Hypertension   . CHF (congestive heart failure)     EF 40-45% by Echo, combined Systolic & Diastolic  Non-Ischemic cardiomyopathy, normal coronary arteries by catheterization in 2009   . Irregular heartbeat   . Shortness of breath   . GERD (gastroesophageal reflux disease)   . Diastolic dysfunction, left ventricle 12/18/2011  . Atrial fibrillation with rapid ventricular response, history of PAF was in SR in 08/2011 12/18/2011  . Fecal impaction 04/13/2012  . Left atrial thrombus 12/2011    By TEE 12/2011  . Type II diabetes mellitus   . CKD (chronic kidney disease) stage 3, GFR 30-59 ml/min 12/26/2011  . Pneumonia 03/31/2012; 04/13/2012    Healthcare-associated pneumonia/notes 04/13/2012  .  Prostate cancer   . PAF (paroxysmal atrial fibrillation) 04/15/2012    TEE in 12/2011 with LAA thrombus - no DCCV     Past Surgical History  Procedure Date  . Prostate surgery   . Tee without cardioversion 12/27/2011    Procedure: TRANSESOPHAGEAL ECHOCARDIOGRAM (TEE);  Surgeon: Chrystie Nose, MD;  Location: [facility] OR;  Service: Cardiovascular;  Laterality: N/A;  MD request Main OR  .  Cardioversion 12/27/2011    Procedure: CARDIOVERSION;  Surgeon: Chrystie Nose, MD;  Location: [facility] OR;  Service: Cardiovascular;  Laterality: N/A;  . Coronary angioplasty with stent placement     FAMHx: Family History  Problem Relation Age of Onset  . Diabetes Mellitus II Mother   . Sudden death Mother   . Prostate cancer Father     SOCHx: he lives with his spouse. He is active with the use of assistance device. Does not drive   reports that he has never smoked. He has never used smokeless tobacco. He reports that he does not drink alcohol or use illicit drugs.  ALLERGIES: No Known Allergies  ROS: irregular heart beat A comprehensive review of systems was negative except for: Constitutional: positive for generalized weakness Gastrointestinal: positive for constipation and nausea; decrease frequency of urination.   HOME MEDICATIONS: Prescriptions prior to admission  Medication Sig Dispense Refill  . allopurinol (ZYLOPRIM) 100 MG tablet Take 1.5 tablets (150 mg total) by mouth every morning.  30 tablet  1  . atenolol (TENORMIN) 100 MG tablet Take 1 tablet (100 mg total) by mouth 2 (two) times daily.  60 tablet  1  . diltiazem (TIAZAC) 300 MG 24 hr capsule Take 1 capsule (300 mg total) by mouth every morning.  30 capsule  1  . furosemide (LASIX) 80 MG tablet Take 0.5 tablets (40 mg total) by mouth 2 (two) times daily.  30 tablet  1  . insulin aspart protamine-insulin aspart (NOVOLOG 70/30) (70-30) 100 UNIT/ML injection Inject 20 Units into the skin 2 (two) times daily with a meal.      . polyethylene glycol (MIRALAX / GLYCOLAX) packet Take 17 g by mouth daily as needed. For constipation      . potassium chloride (K-DUR,KLOR-CON) 10 MEQ tablet Take 2 tablets (20 mEq total) by mouth 2 (two) times daily.  60 tablet  1  . warfarin (COUMADIN) 4 MG tablet Take 4 mg by mouth every evening.        HOSPITAL MEDICATIONS: I have reviewed the patient's current medications.  VITALS: Blood  pressure 146/87, pulse 87, temperature 97.1 F (36.2 C), temperature source Oral, resp. rate 20, height 5' 8.9" (1.75 m), weight 105.461 kg (232 lb 8 oz), SpO2 96.00%.  PHYSICAL EXAM: General appearance: alert, cooperative, appears stated age, no distress and Normal mood and affect Neck: no adenopathy, no carotid bruit and no JVD Lungs: clear to auscultation bilaterally, normal percussion bilaterally and Nonlabored Heart: irregularly irregular rhythm, S1, S2 normal and Mild tachycardic. No M/RG Abdomen: soft, non-tender; bowel sounds normal; no masses,  no organomegaly Extremities: extremities normal, atraumatic, no cyanosis or edema Pulses: 2+ and symmetric Neurologic: Grossly normal  LABS: INR 1.66, was 1.7 yesterday Potassium stable at 3.7 BUN/Creatinine stable at 37/1.33 CBC: 16, 14.2/43.6, 219  IMAGING: No current imaging available  IMPRESSION: Principal Problem:  *Generalized weakness Active Problems:  Atrial fibrillation with rapid ventricular response, history of PAF; RVR currently rates 110s-140s; has had pauses & reported Bradycardia to 30s  DM2 (diabetes mellitus, type 2)  Chronic Diastolic Dysfunction -- exacerbated by Afib RVR; EF ~45%  PAF (paroxysmal atrial fibrillation)  Hypertension  Irregular heartbeat  Prostate cancer  Leukocytosis  CKD (chronic kidney disease) stage 3, GFR 30-59 ml/min  LA thrombus  Constipation   RECOMMENDATION: No active SSx of CHF. -- Agree with proceeding with home dose Lasix 40 mg by mouth twice a day  Needs Warfarin dose adjusted for therapeutic INR given h/o LAA thrombus. Rx dose of Enoxaparin until therapeutic. Apparently unable to afford Xarelto following Dr. Blanchie Dessert recommendation after TEE.  A. fib with RVR: Not currently on beta blocker or calcium channel blocker as per home regiment. Question if this is do to concern for bradycardia/possible tachybradycardia syndrome.   Would restart BB - using shorter acting beta blocker  such as Metoprolol @ 25 mg BID while monitoring HR -- if pauses or significant Bradycardia recur, will likely need PPM placement for TachyBrady Syndrome.   He may require it restart his calcium channel blocker as well however given the concern for possible tachybradycardia, would only had one medicine at the time.  Would likely recommend outpatient monitor to follow heart rate control.  Given the history of Left Atrial Appendage thrombus, though somewhat concerned he continues to be subtherapeutic his INR. At this point, the pharmacy is adjusting his medication or dose, but would cover with therapeutic range) until his INR is therapeutic.  Will follow along. Her ask Dr. Rennis Golden to see the patient tomorrow, a lieu of the December 30 clinic visit.  I discussed the case with Dr. Thedore Mins.   Time Spent Directly with Patient: 15 minutes; 30 minutes reviewing chart   HARDING,DAVID W, M.D., M.S. [facility] [address] [place], [place]  [address]  [phone]  04/15/2012 1:09 PM

## 2012-04-15 NOTE — Progress Notes (Signed)
ANTICOAGULATION CONSULT NOTE - Initial Consult  Pharmacy Consult for Lovenox/Coumadin Indication: h/o Afib/LA thrombus  No Known Allergies  Patient Measurements: Height: 5' 8.9" (175 cm) Weight: 232 lb 8 oz (105.461 kg) (b scale) IBW/kg (Calculated) : 70.47   Vital Signs: Temp: 97.1 F (36.2 C) (01/01 0521) Temp src: Oral (01/01 0521) BP: 146/87 mmHg (01/01 1051) Pulse Rate: 87  (01/01 1051)  Labs:  Basename 04/15/12 0455 04/14/12 0130 04/14/12 0129 04/13/12 1827 04/13/12 1746  HGB -- 14.2 -- -- 14.0  HCT -- 43.6 -- 44.1 43.9  PLT -- 219 -- -- 234  APTT -- -- -- -- --  LABPROT 19.1* -- 19.4* -- --  INR 1.66* -- 1.70* -- --  HEPARINUNFRC -- -- -- -- --  CREATININE -- 1.33 -- -- 1.46*  CKTOTAL -- -- -- -- --  CKMB -- -- -- -- --  TROPONINI -- -- -- -- --    Estimated Creatinine Clearance: 58.2 ml/min (by C-G formula based on Cr of 1.33).   Medical History: Past Medical History  Diagnosis Date  . Hypertension   . CHF (congestive heart failure)   . Irregular heartbeat   . Shortness of breath   . GERD (gastroesophageal reflux disease)   . Diastolic dysfunction, left ventricle 12/18/2011  . Atrial fibrillation with rapid ventricular response, history of PAF was in SR in 08/2011 12/18/2011  . Fecal impaction 04/13/2012  . Left atrial thrombus 12/2011    By TEE 12/2011  . Type II diabetes mellitus   . CKD (chronic kidney disease) stage 3, GFR 30-59 ml/min 12/26/2011  . Pneumonia 03/31/2012; 04/13/2012    Healthcare-associated pneumonia/notes 04/13/2012  . Prostate cancer   . PAF (paroxysmal atrial fibrillation) 04/15/2012    TEE in 12/2011 with LAA thrombus - no DCCV     Medications:  Prescriptions prior to admission  Medication Sig Dispense Refill  . allopurinol (ZYLOPRIM) 100 MG tablet Take 1.5 tablets (150 mg total) by mouth every morning.  30 tablet  1  . atenolol (TENORMIN) 100 MG tablet Take 1 tablet (100 mg total) by mouth 2 (two) times daily.  60 tablet  1  .  diltiazem (TIAZAC) 300 MG 24 hr capsule Take 1 capsule (300 mg total) by mouth every morning.  30 capsule  1  . furosemide (LASIX) 80 MG tablet Take 0.5 tablets (40 mg total) by mouth 2 (two) times daily.  30 tablet  1  . insulin aspart protamine-insulin aspart (NOVOLOG 70/30) (70-30) 100 UNIT/ML injection Inject 20 Units into the skin 2 (two) times daily with a meal.      . polyethylene glycol (MIRALAX / GLYCOLAX) packet Take 17 g by mouth daily as needed. For constipation      . potassium chloride (K-DUR,KLOR-CON) 10 MEQ tablet Take 2 tablets (20 mEq total) by mouth 2 (two) times daily.  60 tablet  1  . warfarin (COUMADIN) 4 MG tablet Take 4 mg by mouth every evening.        Assessment: 77 yo male with h/o LA thrombus for anticoagulation. On Coumadin 4mg  daily at home. INR is currently subtherapeutic at 1.66. Consulted to bridge with Lovenox. CrCl ~60. No bleeding noted.     Goal of Therapy:  INR 2-3 Monitor platelets by anticoagulation protocol: Yes    Plan:  Start Lovenox 150mg  once daily until INR >2 Coumadin 6 mg po tonight Daily INR   Drue Stager 04/15/2012,12:52 PM

## 2012-04-16 DIAGNOSIS — R7881 Bacteremia: Secondary | ICD-10-CM

## 2012-04-16 DIAGNOSIS — I5032 Chronic diastolic (congestive) heart failure: Secondary | ICD-10-CM

## 2012-04-16 LAB — BASIC METABOLIC PANEL
BUN: 26 mg/dL — ABNORMAL HIGH (ref 6–23)
Calcium: 9.1 mg/dL (ref 8.4–10.5)
GFR calc Af Amer: 55 mL/min — ABNORMAL LOW (ref >90)
GFR calc non Af Amer: 47 mL/min — ABNORMAL LOW (ref >90)
Glucose, Bld: 192 mg/dL — ABNORMAL HIGH (ref 70–99)
Sodium: 141 mEq/L (ref 135–145)

## 2012-04-16 LAB — CBC
HCT: 41.6 % (ref 39.0–52.0)
MCHC: 31.7 g/dL (ref 30.0–36.0)
MCV: 93.7 fL (ref 78.0–100.0)
Platelets: 143 10*3/uL — ABNORMAL LOW (ref 150–400)
RDW: 17.1 % — ABNORMAL HIGH (ref 11.5–15.5)

## 2012-04-16 LAB — GLUCOSE, CAPILLARY
Glucose-Capillary: 183 mg/dL — ABNORMAL HIGH (ref 70–99)
Glucose-Capillary: 217 mg/dL — ABNORMAL HIGH (ref 70–99)

## 2012-04-16 LAB — PROTIME-INR: INR: 1.89 — ABNORMAL HIGH (ref 0.00–1.49)

## 2012-04-16 MED ORDER — METOPROLOL TARTRATE 25 MG PO TABS
25.0000 mg | ORAL_TABLET | Freq: Once | ORAL | Status: AC
  Administered 2012-04-16: 25 mg via ORAL
  Filled 2012-04-16: qty 1

## 2012-04-16 MED ORDER — METOPROLOL TARTRATE 1 MG/ML IV SOLN
5.0000 mg | INTRAVENOUS | Status: DC | PRN
  Administered 2012-04-17: 5 mg via INTRAVENOUS
  Filled 2012-04-16: qty 5

## 2012-04-16 MED ORDER — WARFARIN SODIUM 6 MG PO TABS
6.0000 mg | ORAL_TABLET | Freq: Once | ORAL | Status: AC
  Administered 2012-04-16: 6 mg via ORAL
  Filled 2012-04-16 (×2): qty 1

## 2012-04-16 MED ORDER — METOPROLOL TARTRATE 50 MG PO TABS
50.0000 mg | ORAL_TABLET | Freq: Two times a day (BID) | ORAL | Status: DC
  Administered 2012-04-16: 50 mg via ORAL
  Filled 2012-04-16 (×4): qty 1

## 2012-04-16 NOTE — Evaluation (Signed)
Occupational Therapy Evaluation Patient Details Name: Vincent [name] MRN: [id number] DOB: 1935-05-29 Today's Date: 04/16/2012 Time: 1478-2956 OT Time Calculation (min): 15 min  OT Assessment / Plan / Recommendation Clinical Impression  Pt admitted with constipation and weakness.  Also presenting with tachycardia.  Will benefit from acute OT services to address below problem list in prep for d/c home.    OT Assessment  Patient needs continued OT Services    Follow Up Recommendations  No OT follow up;Supervision/Assistance - 24 hour    Barriers to Discharge Decreased caregiver support Wife cannot provide physical assist and is at dialysis 3x a week.  Equipment Recommendations  None recommended by OT    Recommendations for Other Services    Frequency  Min 2X/week    Precautions / Restrictions Precautions Precautions: None Restrictions Weight Bearing Restrictions: No   Pertinent Vitals/Pain HR tachycardic throughout session with high of 172 and then down to 110s.     ADL  Eating/Feeding: Performed;Independent Where Assessed - Eating/Feeding: Chair Upper Body Dressing: Performed;Set up Where Assessed - Upper Body Dressing: Unsupported sitting Lower Body Dressing: Performed;Supervision/safety Where Assessed - Lower Body Dressing: Unsupported sitting Toilet Transfer: Simulated;Supervision/safety Toilet Transfer Method: Sit to Barista:  (bed to chair) Equipment Used: Gait belt;Rolling walker Transfers/Ambulation Related to ADLs: overall supervision with RW for mobility ADL Comments: Pt able to don/doff socks while sitting EOC and leaning anteriorly.  Pt reports he will be alone several hours when wife goes to dialysis (3x weekly).    OT Diagnosis: Generalized weakness  OT Problem List: Decreased strength;Decreased activity tolerance OT Treatment Interventions: Self-care/ADL training;DME and/or AE instruction;Therapeutic activities;Patient/family education     OT Goals Acute Rehab OT Goals OT Goal Formulation: With patient Time For Goal Achievement: 04/23/12 Potential to Achieve Goals: Good ADL Goals Pt Will Perform Grooming: with modified independence;Standing at sink ADL Goal: Grooming - Progress: Goal set today Pt Will Perform Lower Body Dressing: with modified independence;Sit to stand from bed;Sit to stand from chair;with adaptive equipment ADL Goal: Lower Body Dressing - Progress: Goal set today Pt Will Transfer to Toilet: with modified independence;Ambulation;with DME;Comfort height toilet ADL Goal: Toilet Transfer - Progress: Goal set today Miscellaneous OT Goals Miscellaneous OT Goal #1: Pt will perform functional mobility at mod I level during ADLs. OT Goal: Miscellaneous Goal #1 - Progress: Goal set today  Visit Information  Last OT Received On: 04/16/12 Assistance Needed: +1    Subjective Data      Prior Functioning     Home Living Lives With: Spouse Available Help at Discharge: Family;Other (Comment) Type of Home: House Home Access: Stairs to enter;Level entry;Ramped entrance;Other (comment) Entrance Stairs-Number of Steps: 3 Entrance Stairs-Rails: None Home Layout: Two level;Able to live on main level with bedroom/bathroom Alternate Level Stairs-Number of Steps: Patient does not use upstairs Bathroom Shower/Tub: Tub/shower unit;Walk-in shower Bathroom Toilet: Handicapped height Bathroom Accessibility: Yes How Accessible: Accessible via walker Home Adaptive Equipment: Walker - rolling;Tub transfer bench;Walker - four wheeled;Straight cane Prior Function Level of Independence: Independent with assistive device(s) Able to Take Stairs?: No Driving: No Vocation: Retired Musician: No difficulties Dominant Hand: Right         Vision/Perception     Cognition  Overall Cognitive Status: Appears within functional limits for tasks assessed/performed Arousal/Alertness:  Awake/alert Orientation Level: Appears intact for tasks assessed Behavior During Session: [facility] for tasks performed    Extremity/Trunk Assessment Right Upper Extremity Assessment RUE ROM/Strength/Tone: [facility] for tasks assessed Left Upper Extremity Assessment  LUE ROM/Strength/Tone: [facility] for tasks assessed     Mobility Bed Mobility Bed Mobility: Supine to Sit;Sitting - Scoot to Edge of Bed Supine to Sit: 5: Supervision Sitting - Scoot to Edge of Bed: 5: Supervision Sit to Supine: 5: Supervision;HOB flat Details for Bed Mobility Assistance: Needs increased time, but able to complete without A.   Transfers Transfers: Sit to Stand;Stand to Sit Sit to Stand: 5: Supervision;With upper extremity assist;From bed Stand to Sit: 5: Supervision;With upper extremity assist;To chair/3-in-1;With armrests Details for Transfer Assistance: demos good use of UEs     Shoulder Instructions     Exercise     Balance Balance Balance Assessed: No   End of Session OT - End of Session Equipment Utilized During Treatment: Gait belt Activity Tolerance: Patient tolerated treatment well Patient left: in chair;with call bell/phone within reach Nurse Communication: Mobility status  GO    04/16/2012 Cipriano Mile OTR/L Pager [phone] Office [phone]  Cipriano Mile 04/16/2012, 12:47 PM

## 2012-04-16 NOTE — Progress Notes (Signed)
Physical Therapy Treatment Patient Details Name: Vincent [name] MRN: [id number] DOB: 06-Jul-1935 Today's Date: 04/16/2012 Time: 8469-6295 PT Time Calculation (min): 15 min  PT Assessment / Plan / Recommendation Comments on Treatment Session  pt presents with CHF, Tachycardia, and Impaction.  pt moving well, but limited by nonsustained tachy HR.  HR ranged from 110's to 172 during ambulation.      Follow Up Recommendations  Home health PT     Does the patient have the potential to tolerate intense rehabilitation     Barriers to Discharge        Equipment Recommendations  None recommended by PT    Recommendations for Other Services    Frequency Min 3X/week   Plan Discharge plan remains appropriate;Frequency remains appropriate    Precautions / Restrictions Precautions Precautions: None Restrictions Weight Bearing Restrictions: No   Pertinent Vitals/Pain Denies pain.  HR nonsustained Tachy ranging 110's - 172.      Mobility  Bed Mobility Bed Mobility: Supine to Sit;Sitting - Scoot to Edge of Bed Supine to Sit: 5: Supervision Sitting - Scoot to Edge of Bed: 5: Supervision Details for Bed Mobility Assistance: Needs increased time, but able to complete without A.   Transfers Transfers: Sit to Stand;Stand to Sit Sit to Stand: 5: Supervision;With upper extremity assist;From bed Stand to Sit: 5: Supervision;With upper extremity assist;To chair/3-in-1;With armrests Details for Transfer Assistance: demos good use of UEs Ambulation/Gait Ambulation/Gait Assistance: 5: Supervision Ambulation Distance (Feet): 120 Feet Assistive device: Rolling walker Ambulation/Gait Assistance Details: pt moves slowly, but notes this is baseline for him.  pt's HR Tachys up to 172 at highest and back down to 110's.  Never sustained at any rate during mobility.   Gait Pattern: Step-to pattern;Decreased stride length;Trunk flexed Stairs: No Wheelchair Mobility Wheelchair Mobility: No    Exercises      PT Diagnosis:    PT Problem List:   PT Treatment Interventions:     PT Goals Acute Rehab PT Goals Time For Goal Achievement: 04/16/12 Potential to Achieve Goals: Good PT Goal: Supine/Side to Sit - Progress: Progressing toward goal PT Goal: Sit to Stand - Progress: Progressing toward goal PT Goal: Stand to Sit - Progress: Progressing toward goal PT Goal: Ambulate - Progress: Progressing toward goal  Visit Information  Last PT Received On: 04/16/12 Assistance Needed: +1 PT/OT Co-Evaluation/Treatment: Yes    Subjective Data  Subjective: Alright, I'm ready.     Cognition  Overall Cognitive Status: Appears within functional limits for tasks assessed/performed Arousal/Alertness: Awake/alert Orientation Level: Appears intact for tasks assessed Behavior During Session: [facility] for tasks performed    Balance  Balance Balance Assessed: No  End of Session PT - End of Session Equipment Utilized During Treatment: Gait belt Activity Tolerance: Treatment limited secondary to medical complications (Comment) (pt's HR Tachy) Patient left: in chair;with call bell/phone within reach Nurse Communication: Mobility status   GP     Sunny Schlein, [place] [phone] 04/16/2012, 10:29 AM

## 2012-04-16 NOTE — Progress Notes (Signed)
[facility]                                                                                Patient Demographics  Vincent [name], is a 77 y.o. male  ZOX:[id number]  WJX:[id number]  DOB - November 24, 1935  Admit date - 04/13/2012  Admitting Physician Lars Mage, MD  Outpatient Primary MD for the patient is [name], MD  LOS - 3   Chief Complaint  Patient presents with  . Constipation        Assessment & Plan    1. Generalized weakness due to deconditioning and poor oral intake. Chest x-ray and UA are clear, patient is afebrile, does have nonspecific leukocytosis, currently feeling better after gentle hydration overnight, will skip today's second dose of Lasix, increase activity, PT to evaluate along with social worker for placement. No focal neurological signs. This is much improved and patient now wants to go home with home PT which will be arranged.   2. History of atrial fibrillation, had some sinus pauses upon admission, beta blocker dose has been cut down, case was discussed with Dr. Herbie [place] cardiologist on 04/15/2012, currently no pauses, will monitor on telemetry. TSH was 1.6. Since heart rate is slightly high on 04-2012 Lopressor has been increased to 50 twice a day along with when necessary IV Lopressor and we will monitor.   3. Constipation. Relieved after bowel regimen continue.   4. Chronic kidney disease stage III creatinine is around his baseline which is around 1.5.   5. History of LV thrombus. Patient is chronic Coumadin however INR is subtherapeutic, pharmacy has been consulted for Lovenox bridging till INR is 2.  Lab Results  Component Value Date   INR 1.89* 04/16/2012   INR 1.66* 04/15/2012   INR 1.70* 04/14/2012      6. Diabetes mellitus type 2. Continue sliding scale insulin, along with 70/30 scheduled.  CBG (last 3)   Basename 04/16/12 0602 04/15/12 2141 04/15/12 1630  GLUCAP 174* 188* 161*     7. Leukocytosis question if this  is due to recent exposure to steroids, he is afebrile, chest x-ray UA stable, leukocytosis has resolved.     Code Status: Full  Family Communication: With the patient  Disposition Plan: To be decided   Procedures none   Consults  discussed with cardiologist Dr. Herbie [place]   DVT Prophylaxis  Lovenox - and Coumadin  Lab Results  Component Value Date   PLT 143* 04/16/2012    Medications  Scheduled Meds:    . enoxaparin (LOVENOX) injection  150 mg Subcutaneous Q24H  . furosemide  40 mg Oral BID  . insulin aspart  0-9 Units Subcutaneous TID WC  . insulin aspart protamine-insulin aspart  20 Units Subcutaneous BID WC  . metoprolol tartrate  50 mg Oral BID  . metoprolol tartrate  25 mg Oral Once  . polyethylene glycol  17 g Oral Daily  . potassium chloride  20 mEq Oral BID  . Warfarin - Pharmacist Dosing Inpatient   Does not apply q1800   Continuous Infusions:  PRN Meds:.acetaminophen, ondansetron (ZOFRAN) IV  Antibiotics    Anti-infectives    None  Time Spent in minutes   35   SINGH,PRASHANT K M.D on 04/16/2012 at 10:42 AM  Between 7am to 7pm - Pager - [phone]  After 7pm go to [email or web address] - password [email or web address]  And look for the night coverage person covering for me after hours  [facility] Office  [phone]    Subjective:   [name] today has, No headache, No chest pain, No abdominal pain - No Nausea, No new weakness tingling or numbness, No Cough - SOB. Improved generalized weakness.  Objective:   Filed Vitals:   04/15/12 2106 04/16/12 0314 04/16/12 0551 04/16/12 1033  BP: 137/89  142/98 151/87  Pulse: 101  97 106  Temp: 97.7 F (36.5 C)  98.1 F (36.7 C)   TempSrc: Oral  Oral   Resp: 18  18   Height:      Weight:  105.28 kg (232 lb 1.6 oz)    SpO2: 100%  100%     Wt Readings from Last 3 Encounters:  04/16/12 105.28 kg (232 lb 1.6 oz)  04/11/12 105 kg (231 lb 7.7 oz)  04/06/12 116.1 kg (255 lb 15.3 oz)      Intake/Output Summary (Last 24 hours) at 04/16/12 1042 Last data filed at 04/16/12 0900  Gross per 24 hour  Intake   1200 ml  Output    952 ml  Net    248 ml    Exam Awake Alert, Oriented X 3, No new F.N deficits, Normal affect .AT,PERRAL Supple Neck,No JVD, No cervical lymphadenopathy appriciated.  Symmetrical Chest wall movement, Good air movement bilaterally, CTAB iRRR,No Gallops,Rubs or new Murmurs, No Parasternal Heave +ve B.Sounds, Abd Soft, Non tender, No organomegaly appriciated, No rebound - guarding or rigidity. No Cyanosis, Clubbing or edema, No new Rash or bruise      Data Review   Micro Results No results found for this or any previous visit (from the past 240 hour(s)).  Radiology Reports Dg Chest 2 View  04/13/2012  *RADIOLOGY REPORT*  Clinical Data: Constipation; recent history of pneumonia.  CHEST - 2 VIEW  Comparison: Chest radiograph performed 04/09/2012  Findings: The lungs are well-aerated.  Minimal bibasilar atelectasis is noted.  There is no evidence of pleural effusion or pneumothorax.  A calcified granuloma is noted at the right lung apex.  Mild chronically increased interstitial markings are seen.  The heart is borderline normal in size; calcification is noted in the aortic arch.  No acute osseous abnormalities are seen.  Mild degenerative change is noted at the right humeral head.  IMPRESSION: Minimal bibasilar atelectasis noted; mild chronic lung changes seen.   Original Report Authenticated By: Tonia Ghent, M.D.    Dg Chest 2 View  04/09/2012  *RADIOLOGY REPORT*  Clinical Data:  Pneumonia, follow-up, finishing antibiotics  CHEST - 2 VIEW  Comparison: 04/03/2012  Findings: Upper normal heart size. Atherosclerotic calcification aorta. Mediastinal contours and pulmonary vascularity normal. Bronchitic changes with decrease in perihilar markings versus previous study. Calcified granuloma right upper lobe. No acute infiltrate, pleural effusion or  pneumothorax. Multilevel endplate spur formation thoracic spine.  IMPRESSION: Bronchitic changes with decreased accentuation perihilar markings versus previous study suggesting improved infiltrate.   Original Report Authenticated By: Ulyses Southward, M.D.    Dg Chest 2 View  03/31/2012  *RADIOLOGY REPORT*  Clinical Data: Shortness of breath, vomiting, altered mental status  CHEST - 2 VIEW  Comparison: 02/09/2012  Findings: Chronic interstitial markings.  No focal consolidation. No pleural effusion or pneumothorax.  Stable mild  cardiomegaly.  Degenerative changes of the visualized thoracolumbar spine.  IMPRESSION: No evidence of acute cardiopulmonary disease.   Original Report Authenticated By: Charline Bills, M.D.    Dg Chest Port 1 View  04/03/2012  *RADIOLOGY REPORT*  Clinical Data: Hospital acquired pneumonia.  PORTABLE CHEST - 1 VIEW  Comparison: 03/31/2012  Findings: Single view of the chest was obtained.  Stable calcified granuloma in the right upper lung.  There are coarse lung markings suggesting chronic changes.  There are vague densities in the right upper lung and difficult to exclude subtle airspace disease.  Heart size is stable within normal limits.  No evidence for a pneumothorax.  IMPRESSION: Chronic lung changes.  Difficult to exclude subtle acute disease in the right lung.   Original Report Authenticated By: Richarda Overlie, M.D.     CBC  Lab 04/16/12 0620 04/14/12 0130 04/13/12 1827 04/13/12 1746  WBC 8.6 16.0* -- 15.9*  HGB 13.2 14.2 -- 14.0  HCT 41.6 43.6 44.1 43.9  PLT 143* 219 -- 234  MCV 93.7 92.2 -- 91.6  MCH 29.7 30.0 -- 29.2  MCHC 31.7 32.6 -- 31.9  RDW 17.1* 16.9* -- 16.9*  LYMPHSABS -- -- -- 2.1  MONOABS -- -- -- 1.7*  EOSABS -- -- -- 0.2  BASOSABS -- -- -- 0.0  BANDABS -- -- -- --    Chemistries   Lab 04/16/12 0620 04/14/12 0130 04/13/12 1827 04/13/12 1746  NA 141 138 -- 138  K 4.3 3.7 -- 4.1  CL 101 98 -- 96  CO2 33* 29 -- 28  GLUCOSE 192* 176* -- 336*  BUN  26* 37* -- 43*  CREATININE 1.42* 1.33 -- 1.46*  CALCIUM 9.1 9.2 -- 9.4  MG 2.1 -- -- --  AST -- -- 49* --  ALT -- -- 58* --  ALKPHOS -- -- 127* --  BILITOT -- -- 0.6 --   ------------------------------------------------------------------------------------------------------------------ estimated creatinine clearance is 54.5 ml/min (by C-G formula based on Cr of 1.42). ------------------------------------------------------------------------------------------------------------------ No results found for this basename: HGBA1C:2 in the last 72 hours ------------------------------------------------------------------------------------------------------------------ No results found for this basename: CHOL:2,HDL:2,LDLCALC:2,TRIG:2,CHOLHDL:2,LDLDIRECT:2 in the last 72 hours ------------------------------------------------------------------------------------------------------------------  [facility] 04/14/12 0129  TSH 1.659  T4TOTAL --  T3FREE --  THYROIDAB --   ------------------------------------------------------------------------------------------------------------------ No results found for this basename: VITAMINB12:2,FOLATE:2,FERRITIN:2,TIBC:2,IRON:2,RETICCTPCT:2 in the last 72 hours  Coagulation profile  Lab 04/16/12 0620 04/15/12 0455 04/14/12 0129 04/11/12 0805 04/10/12 1000  INR 1.89* 1.66* 1.70* 2.29* 3.12*  PROTIME -- -- -- -- --    No results found for this basename: DDIMER:2 in the last 72 hours  Cardiac Enzymes No results found for this basename: CK:3,CKMB:3,TROPONINI:3,MYOGLOBIN:3 in the last 168 hours ------------------------------------------------------------------------------------------------------------------ No components found with this basename: POCBNP:3

## 2012-04-16 NOTE — Progress Notes (Signed)
Pt. Seen and examined. Agree with the NP/PA-C note as written.  He appears very comfortable. I have noted his HR changes. I agree with increased dose b-blocker for now.  INR is close to therapeutic, but I would keep him until tomorrow to make sure we have him therapeutic and that he does not have any symptomatic bradycardia or pauses on the increased dose lopressor. Plan to arrange an outpatient 30 day monitor and follow-up with me in the [place] office.  Chrystie Nose, MD, [facility] Attending Cardiologist [facility]

## 2012-04-16 NOTE — Care Management Note (Signed)
    Page 1 of 1   04/16/2012     4:27:55 PM   CARE MANAGEMENT NOTE 04/16/2012  Patient:  Vincent [name], Vincent [name]   Account Number:  [id number]  Date Initiated:  04/16/2012  Documentation initiated by:  Tera Mater  Subjective/Objective Assessment:   76yo admitted with Weakness.  Pt. lives at home with family.     Action/Plan:   Prior to admission pt. was being seen with [facility] for [facility] RN, [facility] PT/OT.  Will obtain resumption of care orders.   Anticipated DC Date:  04/17/2012   Anticipated DC Plan:  HOME W HOME HEALTH SERVICES      DC Planning Services  CM consult      [facility] Choice  Resumption Of Svcs/PTA Provider   Choice offered to / List presented to:             [facility] agency  [facility]   Status of service:  In process, will continue to follow Medicare Important Message given?   (If response is "NO", the following Medicare IM given date fields will be blank) Date Medicare IM given:   Date Additional Medicare IM given:    Discharge Disposition:    Per UR Regulation:  Reviewed for med. necessity/level of care/duration of stay  If discussed at Long Length of Stay Meetings, dates discussed:    Comments:  04/17/11 1615 TC to Lupita Leash, with [facility], to give resumption of care orders.  Pt. may dc home today. Tera Mater, RN, BSN NCM [phone]

## 2012-04-16 NOTE — Progress Notes (Signed)
ANTICOAGULATION CONSULT NOTE - Follow up Consult  Pharmacy Consult for Lovenox/Coumadin Indication: h/o Afib/LA thrombus  No Known Allergies  Patient Measurements: Height: 5' 8.9" (175 cm) Weight: 232 lb 1.6 oz (105.28 kg) (scale b) IBW/kg (Calculated) : 70.47   Vital Signs: Temp: 98.1 F (36.7 C) (01/02 0551) Temp src: Oral (01/02 0551) BP: 151/87 mmHg (01/02 1033) Pulse Rate: 106  (01/02 1033)  Labs:  Basename 04/16/12 0620 04/15/12 0455 04/14/12 0130 04/14/12 0129 04/13/12 1827 04/13/12 1746  HGB 13.2 -- 14.2 -- -- --  HCT 41.6 -- 43.6 -- 44.1 --  PLT 143* -- 219 -- -- 234  APTT -- -- -- -- -- --  LABPROT 21.0* 19.1* -- 19.4* -- --  INR 1.89* 1.66* -- 1.70* -- --  HEPARINUNFRC -- -- -- -- -- --  CREATININE 1.42* -- 1.33 -- -- 1.46*  CKTOTAL -- -- -- -- -- --  CKMB -- -- -- -- -- --  TROPONINI -- -- -- -- -- --    Estimated Creatinine Clearance: 54.5 ml/min (by C-G formula based on Cr of 1.42).   Medical History: Past Medical History  Diagnosis Date  . Hypertension   . CHF (congestive heart failure)     EF 40-45% by Echo, combined Systolic & Diastolic  . Irregular heartbeat   . Shortness of breath   . GERD (gastroesophageal reflux disease)   . Diastolic dysfunction, left ventricle 12/18/2011  . Atrial fibrillation with rapid ventricular response, history of PAF was in SR in 08/2011 12/18/2011  . Fecal impaction 04/13/2012  . Left atrial thrombus 12/2011    By TEE 12/2011  . Type II diabetes mellitus   . CKD (chronic kidney disease) stage 3, GFR 30-59 ml/min 12/26/2011  . Pneumonia 03/31/2012; 04/13/2012    Healthcare-associated pneumonia/notes 04/13/2012  . Prostate cancer   . PAF (paroxysmal atrial fibrillation) 04/15/2012    TEE in 12/2011 with LAA thrombus - no DCCV     Medications:  Prescriptions prior to admission  Medication Sig Dispense Refill  . allopurinol (ZYLOPRIM) 100 MG tablet Take 1.5 tablets (150 mg total) by mouth every morning.  30 tablet  1  .  atenolol (TENORMIN) 100 MG tablet Take 1 tablet (100 mg total) by mouth 2 (two) times daily.  60 tablet  1  . diltiazem (TIAZAC) 300 MG 24 hr capsule Take 1 capsule (300 mg total) by mouth every morning.  30 capsule  1  . furosemide (LASIX) 80 MG tablet Take 0.5 tablets (40 mg total) by mouth 2 (two) times daily.  30 tablet  1  . insulin aspart protamine-insulin aspart (NOVOLOG 70/30) (70-30) 100 UNIT/ML injection Inject 20 Units into the skin 2 (two) times daily with a meal.      . polyethylene glycol (MIRALAX / GLYCOLAX) packet Take 17 g by mouth daily as needed. For constipation      . potassium chloride (K-DUR,KLOR-CON) 10 MEQ tablet Take 2 tablets (20 mEq total) by mouth 2 (two) times daily.  60 tablet  1  . warfarin (COUMADIN) 4 MG tablet Take 4 mg by mouth every evening.        Assessment: 77 y/o male patient with h/o LA thrombus and afib on chronic coumadin for anticoagulation. On Coumadin 4mg  daily at home. INR is currently subtherapeutic but continues to trend up. Plan to continue lovenox until INR therapeutic. No bleeding noted.     Goal of Therapy:  INR 2-3 Monitor platelets by anticoagulation protocol: Yes  Plan:  Continue Lovenox 150mg  once daily until INR >2 Repeat Coumadin 6 mg po tonight F/u daily INR  Verlene Mayer, PharmD, BCPS Pager [phone] 04/16/2012,12:43 PM

## 2012-04-16 NOTE — Progress Notes (Signed)
We scheduled [name] a coumadin clinic appt in our office on Tuesday --if he goes home with home Health it may be better for pt to have them draw and send to out office.  Please notify us if he will have home health draw.

## 2012-04-16 NOTE — Progress Notes (Signed)
Subjective: This is a 77 y.o. male with a past medical history significant for Mild Systolic Dysfunction (EF ~40-45%) along with Diastolic Dysfunction along with PAF (on warfarin, followed @ [facility], H/O LAA thrombus on TEE 12/2011 - so no DCCV). He was seen by Dr. Rennis Golden on 02/07/12 -- with worsening CHF symptoms, due to medical non-adherence, not taking Lasix. He was instructed to take increased doses PO for several days, then return to the original planned dose. He was then supposed to f/u with the [facility] pharmacist Phillips Hay) for INR monitoring & HF monitoring (unfortunately, her notes are not currently available). Dr. Rennis Golden was planning to see him on December 30, however that was a day he was admitted for recurrent complaint. The original plan was to convert to Xarelto -- not done for monetary reasons.   He was admitted on December 17 for complaints of nausea vomiting and cough. He was noted to have a low-grade fever and shortness of breath, with a questionable pneumonia on chest x-ray. He was placed on antibiotics as well as Tamiflu along with steroid taper. During this time his INR was borderline therapeutic with ranges in about 1.8-2.0. He went a for a short-term to rehabilitation due to deconditioning -- on diltiazem 300 mg and atenolol 100 mg twice a day for rate control. This is in addition to 40 mg Lasix twice a day.   Also with bradycardia at times, other times rate 140. Was to wear outpt cardionet.   Objective: Vital signs in last 24 hours: Temp:  [97.7 F (36.5 C)-98.1 F (36.7 C)] 98.1 F (36.7 C) (01/02 0551) Pulse Rate:  [84-101] 97  (01/02 0551) Resp:  [18-20] 18  (01/02 0551) BP: (137-146)/(87-98) 142/98 mmHg (01/02 0551) SpO2:  [98 %-100 %] 100 % (01/02 0551) Weight:  [105.28 kg (232 lb 1.6 oz)] 105.28 kg (232 lb 1.6 oz) (01/02 0314) Weight change: -0.181 kg (-6.4 oz) Last BM Date: 04/15/12 Intake/Output from previous day: +328  Wt 105.2 up form 102.9 on admit 01/01 0701 -  01/02 0700 In: 1080 [P.O.:1080] Out: 752 [Urine:750; Stool:2] Intake/Output this shift:    PE: General:alert and oriented, feels better, impaction resoved Heart:irreg irreg Lungs:few crackle somewhat diminished. Abd:+ BS, soft, non tender Ext:no edema    Lab Results:  Basename 04/16/12 0620 04/14/12 0130  WBC 8.6 16.0*  HGB 13.2 14.2  HCT 41.6 43.6  PLT 143* 219   BMET  Basename 04/16/12 0620 04/14/12 0130  NA 141 138  K 4.3 3.7  CL 101 98  CO2 33* 29  GLUCOSE 192* 176*  BUN 26* 37*  CREATININE 1.42* 1.33  CALCIUM 9.1 9.2   No results found for this basename: TROPONINI:2,CK,MB:2 in the last 72 hours  Lab Results  Component Value Date   CHOL 126 12/19/2011   HDL 26* 12/19/2011   LDLCALC 70 12/19/2011   TRIG 148 12/19/2011   CHOLHDL 4.8 12/19/2011   Lab Results  Component Value Date   HGBA1C 9.1* 04/01/2012     Lab Results  Component Value Date   TSH 1.659 04/14/2012    Hepatic Function Panel  Basename 04/13/12 1827  PROT 6.9  ALBUMIN 3.2*  AST 49*  ALT 58*  ALKPHOS 127*  BILITOT 0.6  BILIDIR 0.1  IBILI 0.5   No results found for this basename: CHOL in the last 72 hours No results found for this basename: PROTIME in the last 72 hours    EKG: Orders placed during the hospital encounter of 04/13/12  .  ED EKG  . ED EKG  . EKG 12-LEAD  . EKG 12-LEAD    Studies/Results: No results found.  Medications: I have reviewed the patient's current medications.    . enoxaparin (LOVENOX) injection  150 mg Subcutaneous Q24H  . furosemide  40 mg Oral BID  . insulin aspart  0-9 Units Subcutaneous TID WC  . insulin aspart protamine-insulin aspart  20 Units Subcutaneous BID WC  . metoprolol tartrate  25 mg Oral BID  . polyethylene glycol  17 g Oral Daily  . potassium chloride  20 mEq Oral BID  . Warfarin - Pharmacist Dosing Inpatient   Does not apply q1800   Assessment/Plan: Principal Problem:  *Generalized weakness Active Problems:  Hypertension   Irregular heartbeat  Prostate cancer  Atrial fibrillation with rapid ventricular response, history of PAF; RVR currently rates 110s-140s; has had pauses & reported Bradycardia to 30s  DM2 (diabetes mellitus, type 2)  Leukocytosis  CKD (chronic kidney disease) stage 3, GFR 30-59 ml/min  LA thrombus  Chronic Diastolic Dysfunction -- exacerbated by Afib RVR; EF ~45%  Constipation  PAF (paroxysmal atrial fibrillation)  PLAN: on Lovenox until INR therapeutic.  HR improved on BB. Today increasing to 50 BID.  HR improved now.  With activity it increases.  Pt and family in difficult position, Pt's granddaughter who he has custody of, is in Pediatric ICU.  Pt's usual family support is not in place, which probably led to this admit.  He just did not take meds, and was weak.  His daughter is helping with the granddaughter and her mother so no one is really able to help the pt.  To the extent he requires.  Concern that he will not be ready for discharge tomorrow with HR increased today with attempt to exercise. And if HR controlled, not sure he will take meds.  He is adamant about certain decisions with his care.  We briefly discussed his Tachy brady syndrome and possible need in future of pacemaker.  He does not wish to have pacemaker.  He does agree to outpatient monitor.    LOS: 3 days   INGOLD,LAURA R 04/16/2012, 10:16 AM

## 2012-04-17 DIAGNOSIS — Z7901 Long term (current) use of anticoagulants: Secondary | ICD-10-CM

## 2012-04-17 DIAGNOSIS — IMO0001 Reserved for inherently not codable concepts without codable children: Secondary | ICD-10-CM

## 2012-04-17 LAB — GLUCOSE, CAPILLARY
Glucose-Capillary: 155 mg/dL — ABNORMAL HIGH (ref 70–99)
Glucose-Capillary: 143 mg/dL — ABNORMAL HIGH (ref 70–99)
Glucose-Capillary: 160 mg/dL — ABNORMAL HIGH (ref 70–99)
Glucose-Capillary: 115 mg/dL — ABNORMAL HIGH (ref 70–99)

## 2012-04-17 MED ORDER — METOPROLOL TARTRATE 100 MG PO TABS: 100.0000 mg | ORAL_TABLET | Freq: Two times a day (BID) | ORAL | Status: DC

## 2012-04-17 MED ORDER — FUROSEMIDE 40 MG PO TABS: 40.0000 mg | ORAL_TABLET | Freq: Two times a day (BID) | ORAL | Status: DC

## 2012-04-17 MED ORDER — METOPROLOL TARTRATE 50 MG PO TABS: 75.0000 mg | ORAL_TABLET | Freq: Two times a day (BID) | ORAL | Status: DC

## 2012-04-17 MED ORDER — POTASSIUM CHLORIDE CRYS ER 10 MEQ PO TBCR: 20.0000 meq | EXTENDED_RELEASE_TABLET | Freq: Every day | ORAL | Status: DC

## 2012-04-17 MED ORDER — METOPROLOL TARTRATE 100 MG PO TABS
100.0000 mg | ORAL_TABLET | Freq: Two times a day (BID) | ORAL | Status: DC
  Administered 2012-04-17 – 2012-04-18 (×3): 100 mg via ORAL
  Filled 2012-04-17 (×4): qty 1

## 2012-04-17 MED ORDER — FUROSEMIDE 40 MG PO TABS: 40.0000 mg | ORAL_TABLET | Freq: Every day | ORAL | Status: DC

## 2012-04-17 MED ORDER — WARFARIN SODIUM 4 MG PO TABS
4.0000 mg | ORAL_TABLET | Freq: Once | ORAL | Status: AC
  Administered 2012-04-17: 4 mg via ORAL
  Filled 2012-04-17: qty 1

## 2012-04-17 NOTE — Progress Notes (Signed)
[facility]                                                                                Patient Demographics  Vincent [name], is a 77 y.o. male  RUE:[id number]  JYN:[id number]  DOB - 1936/02/26  Admit date - 04/13/2012  Admitting Physician Lars Mage, MD  Outpatient Primary MD for the patient is [name], MD  LOS - 4   Chief Complaint  Patient presents with  . Constipation        Assessment & Plan    1. Generalized weakness due to deconditioning and poor oral intake. Chest x-ray and UA are clear, patient is afebrile, does have nonspecific leukocytosis, currently feeling better after gentle hydration overnight, will skip today's second dose of Lasix, increase activity, PT to evaluate along with social worker for placement. No focal neurological signs. This is much improved and patient now wants to go home with home PT which will be arranged.   2. History of atrial fibrillation, had some sinus pauses upon admission, beta blocker dose has been cut down, case was discussed with Dr. Herbie Baltimore cardiologist on 04/15/2012, currently no pauses, will monitor on telemetry. TSH was 1.6. Since heart rate is slightly high on 04-17-2012 Lopressor has been increased to 100 twice a day along with when necessary IV Lopressor and we will monitor. Discussed with cardiology Dr Royann Shivers bedside, if no further sinus pauses tonight and heart rate stable will be discharged tomorrow morning.    3. Constipation. Relieved after bowel regimen continue.    4. Chronic kidney disease stage III creatinine is around his baseline which is around 1.5.     5. History of LV thrombus. Patient is chronic Coumadin now INR is therapeutic Lovenox bridging we'll stop.  Lab Results  Component Value Date   INR 2.24* 04/17/2012   INR 1.89* 04/16/2012   INR 1.66* 04/15/2012      6. Diabetes mellitus type 2. Continue sliding scale insulin, along with 70/30 scheduled.  CBG (last 3)   Basename  04/17/12 0628 04/16/12 2126 04/16/12 1635  GLUCAP 155* 217* 183*     7. Leukocytosis question if this is due to recent exposure to steroids, he is afebrile, chest x-ray UA stable, leukocytosis has resolved.     Code Status: Full  Family Communication: With the patient  Disposition Plan: To be decided   Procedures none   Consults  discussed with cardiologist Dr. Herbie Baltimore   DVT Prophylaxis  Lovenox - and Coumadin  Lab Results  Component Value Date   PLT 143* 04/16/2012    Medications  Scheduled Meds:    . furosemide  40 mg Oral BID  . insulin aspart  0-9 Units Subcutaneous TID WC  . insulin aspart protamine-insulin aspart  20 Units Subcutaneous BID WC  . metoprolol tartrate  100 mg Oral BID  . polyethylene glycol  17 g Oral Daily  . potassium chloride  20 mEq Oral BID  . warfarin  4 mg Oral ONCE-1800  . Warfarin - Pharmacist Dosing Inpatient   Does not apply q1800   Continuous Infusions:  PRN Meds:.acetaminophen, metoprolol, ondansetron (ZOFRAN) IV  Antibiotics    Anti-infectives  None       Time Spent in minutes   35   Susa Raring K M.D on 04/17/2012 at 10:48 AM  Between 7am to 7pm - Pager - [phone]  After 7pm go to [email or web address] - password [email or web address]  And look for the night coverage person covering for me after hours  [facility] Office  [phone]    Subjective:   [name] today has, No headache, No chest pain, No abdominal pain - No Nausea, No new weakness tingling or numbness, No Cough - SOB. Improved generalized weakness.  Objective:   Filed Vitals:   04/16/12 1457 04/16/12 2129 04/17/12 0330 04/17/12 0841  BP: 118/87 129/76 133/92 133/83  Pulse: 84 95 115 116  Temp: 97.7 F (36.5 C) 97.2 F (36.2 C) 97.4 F (36.3 C)   TempSrc: Oral Oral Oral   Resp: 18 19 20    Height:      Weight:   107.23 kg (236 lb 6.4 oz)   SpO2: 100% 98% 99%     Wt Readings from Last 3 Encounters:  04/17/12 107.23 kg (236 lb 6.4 oz)    04/11/12 105 kg (231 lb 7.7 oz)  04/06/12 116.1 kg (255 lb 15.3 oz)     Intake/Output Summary (Last 24 hours) at 04/17/12 1048 Last data filed at 04/17/12 0840  Gross per 24 hour  Intake   1080 ml  Output   1302 ml  Net   -222 ml    Exam Awake Alert, Oriented X 3, No new F.N deficits, Normal affect [place].AT,PERRAL Supple Neck,No JVD, No cervical lymphadenopathy appriciated.  Symmetrical Chest wall movement, Good air movement bilaterally, CTAB iRRR,No Gallops,Rubs or new Murmurs, No Parasternal Heave +ve B.Sounds, Abd Soft, Non tender, No organomegaly appriciated, No rebound - guarding or rigidity. No Cyanosis, Clubbing or edema, No new Rash or bruise      Data Review   Micro Results No results found for this or any previous visit (from the past 240 hour(s)).  Radiology Reports Dg Chest 2 View  04/13/2012  *RADIOLOGY REPORT*  Clinical Data: Constipation; recent history of pneumonia.  CHEST - 2 VIEW  Comparison: Chest radiograph performed 04/09/2012  Findings: The lungs are well-aerated.  Minimal bibasilar atelectasis is noted.  There is no evidence of pleural effusion or pneumothorax.  A calcified granuloma is noted at the right lung apex.  Mild chronically increased interstitial markings are seen.  The heart is borderline normal in size; calcification is noted in the aortic arch.  No acute osseous abnormalities are seen.  Mild degenerative change is noted at the right humeral head.  IMPRESSION: Minimal bibasilar atelectasis noted; mild chronic lung changes seen.   Original Report Authenticated By: Tonia Ghent, M.D.    Dg Chest 2 View  04/09/2012  *RADIOLOGY REPORT*  Clinical Data:  Pneumonia, follow-up, finishing antibiotics  CHEST - 2 VIEW  Comparison: 04/03/2012  Findings: Upper normal heart size. Atherosclerotic calcification aorta. Mediastinal contours and pulmonary vascularity normal. Bronchitic changes with decrease in perihilar markings versus previous study. Calcified  granuloma right upper lobe. No acute infiltrate, pleural effusion or pneumothorax. Multilevel endplate spur formation thoracic spine.  IMPRESSION: Bronchitic changes with decreased accentuation perihilar markings versus previous study suggesting improved infiltrate.   Original Report Authenticated By: Ulyses Southward, M.D.    Dg Chest 2 View  03/31/2012  *RADIOLOGY REPORT*  Clinical Data: Shortness of breath, vomiting, altered mental status  CHEST - 2 VIEW  Comparison: 02/09/2012  Findings: Chronic interstitial markings.  No focal  consolidation. No pleural effusion or pneumothorax.  Stable mild cardiomegaly.  Degenerative changes of the visualized thoracolumbar spine.  IMPRESSION: No evidence of acute cardiopulmonary disease.   Original Report Authenticated By: Charline Bills, M.D.    Dg Chest Port 1 View  04/03/2012  *RADIOLOGY REPORT*  Clinical Data: Hospital acquired pneumonia.  PORTABLE CHEST - 1 VIEW  Comparison: 03/31/2012  Findings: Single view of the chest was obtained.  Stable calcified granuloma in the right upper lung.  There are coarse lung markings suggesting chronic changes.  There are vague densities in the right upper lung and difficult to exclude subtle airspace disease.  Heart size is stable within normal limits.  No evidence for a pneumothorax.  IMPRESSION: Chronic lung changes.  Difficult to exclude subtle acute disease in the right lung.   Original Report Authenticated By: Richarda Overlie, M.D.     CBC  Lab 04/16/12 0620 04/14/12 0130 04/13/12 1827 04/13/12 1746  WBC 8.6 16.0* -- 15.9*  HGB 13.2 14.2 -- 14.0  HCT 41.6 43.6 44.1 43.9  PLT 143* 219 -- 234  MCV 93.7 92.2 -- 91.6  MCH 29.7 30.0 -- 29.2  MCHC 31.7 32.6 -- 31.9  RDW 17.1* 16.9* -- 16.9*  LYMPHSABS -- -- -- 2.1  MONOABS -- -- -- 1.7*  EOSABS -- -- -- 0.2  BASOSABS -- -- -- 0.0  BANDABS -- -- -- --    Chemistries   Lab 04/16/12 0620 04/14/12 0130 04/13/12 1827 04/13/12 1746  NA 141 138 -- 138  K 4.3 3.7 -- 4.1    CL 101 98 -- 96  CO2 33* 29 -- 28  GLUCOSE 192* 176* -- 336*  BUN 26* 37* -- 43*  CREATININE 1.42* 1.33 -- 1.46*  CALCIUM 9.1 9.2 -- 9.4  MG 2.1 -- -- --  AST -- -- 49* --  ALT -- -- 58* --  ALKPHOS -- -- 127* --  BILITOT -- -- 0.6 --   ------------------------------------------------------------------------------------------------------------------ estimated creatinine clearance is 55 ml/min (by C-G formula based on Cr of 1.42). ------------------------------------------------------------------------------------------------------------------ No results found for this basename: HGBA1C:2 in the last 72 hours ------------------------------------------------------------------------------------------------------------------ No results found for this basename: CHOL:2,HDL:2,LDLCALC:2,TRIG:2,CHOLHDL:2,LDLDIRECT:2 in the last 72 hours ------------------------------------------------------------------------------------------------------------------ No results found for this basename: TSH,T4TOTAL,FREET3,T3FREE,THYROIDAB in the last 72 hours ------------------------------------------------------------------------------------------------------------------ No results found for this basename: VITAMINB12:2,FOLATE:2,FERRITIN:2,TIBC:2,IRON:2,RETICCTPCT:2 in the last 72 hours  Coagulation profile  Lab 04/17/12 0533 04/16/12 0620 04/15/12 0455 04/14/12 0129 04/11/12 0805  INR 2.24* 1.89* 1.66* 1.70* 2.29*  PROTIME -- -- -- -- --    No results found for this basename: DDIMER:2 in the last 72 hours  Cardiac Enzymes No results found for this basename: CK:3,CKMB:3,TROPONINI:3,MYOGLOBIN:3 in the last 168 hours ------------------------------------------------------------------------------------------------------------------ No components found with this basename: POCBNP:3

## 2012-04-17 NOTE — Plan of Care (Signed)
Problem: Phase I Progression Outcomes Goal: EF % per last Echo/documented,Core Reminder form on chart Outcome: Completed/Met Date Met:  04/17/12 Last Echo done in September 2013.

## 2012-04-17 NOTE — Progress Notes (Signed)
ANTICOAGULATION CONSULT NOTE - Follow up Consult  Pharmacy Consult for Lovenox/Coumadin Indication: h/o Afib/LA thrombus  No Known Allergies  Patient Measurements: Height: 5' 8.9" (175 cm) Weight: 236 lb 6.4 oz (107.23 kg) (b scale) IBW/kg (Calculated) : 70.47   Vital Signs: Temp: 97.4 F (36.3 C) (01/03 0330) Temp src: Oral (01/03 0330) BP: 133/83 mmHg (01/03 0841) Pulse Rate: 116  (01/03 0841)  Labs:  Basename 04/17/12 0533 04/16/12 0620 04/15/12 0455  HGB -- 13.2 --  HCT -- 41.6 --  PLT -- 143* --  APTT -- -- --  LABPROT 23.8* 21.0* 19.1*  INR 2.24* 1.89* 1.66*  HEPARINUNFRC -- -- --  CREATININE -- 1.42* --  CKTOTAL -- -- --  CKMB -- -- --  TROPONINI -- -- --    Estimated Creatinine Clearance: 55 ml/min (by C-G formula based on Cr of 1.42).   Medical History: Past Medical History  Diagnosis Date  . Hypertension   . CHF (congestive heart failure)     EF 40-45% by Echo, combined Systolic & Diastolic  . Irregular heartbeat   . Shortness of breath   . GERD (gastroesophageal reflux disease)   . Diastolic dysfunction, left ventricle 12/18/2011  . Atrial fibrillation with rapid ventricular response, history of PAF was in SR in 08/2011 12/18/2011  . Fecal impaction 04/13/2012  . Left atrial thrombus 12/2011    By TEE 12/2011  . Type II diabetes mellitus   . CKD (chronic kidney disease) stage 3, GFR 30-59 ml/min 12/26/2011  . Pneumonia 03/31/2012; 04/13/2012    Healthcare-associated pneumonia/notes 04/13/2012  . Prostate cancer   . PAF (paroxysmal atrial fibrillation) 04/15/2012    TEE in 12/2011 with LAA thrombus - no DCCV     Medications:  Prescriptions prior to admission  Medication Sig Dispense Refill  . allopurinol (ZYLOPRIM) 100 MG tablet Take 1.5 tablets (150 mg total) by mouth every morning.  30 tablet  1  . atenolol (TENORMIN) 100 MG tablet Take 1 tablet (100 mg total) by mouth 2 (two) times daily.  60 tablet  1  . diltiazem (TIAZAC) 300 MG 24 hr capsule Take  1 capsule (300 mg total) by mouth every morning.  30 capsule  1  . furosemide (LASIX) 80 MG tablet Take 0.5 tablets (40 mg total) by mouth 2 (two) times daily.  30 tablet  1  . insulin aspart protamine-insulin aspart (NOVOLOG 70/30) (70-30) 100 UNIT/ML injection Inject 20 Units into the skin 2 (two) times daily with a meal.      . polyethylene glycol (MIRALAX / GLYCOLAX) packet Take 17 g by mouth daily as needed. For constipation      . potassium chloride (K-DUR,KLOR-CON) 10 MEQ tablet Take 2 tablets (20 mEq total) by mouth 2 (two) times daily.  60 tablet  1  . warfarin (COUMADIN) 4 MG tablet Take 4 mg by mouth every evening.        Assessment: 77 y/o male patient with h/o LA thrombus and afib on chronic coumadin for anticoagulation. On Coumadin 4mg  daily at home. INR is currently therapeutic and continues to trend up. Ok to d/c lovenox today, will attempt home dosage. No bleeding noted.     Goal of Therapy:  INR 2-3 Monitor platelets by anticoagulation protocol: Yes    Plan:  D/c lovenox Coumadin 4mg  today F/u daily protime  Verlene Mayer, PharmD, BCPS Pager [phone] 04/17/2012,10:25 AM

## 2012-04-17 NOTE — Progress Notes (Signed)
PT Cancellation Note  Patient Details Name: Vincent [name] MRN: [id number] DOB: 1935-10-31   Cancelled Treatment:    Reason Eval/Treat Not Completed: Fatigue/lethargy limiting ability to participate.  Patient reported fatigue and pain in bil. Feet.  Requests PT be deferred today.  Will return in am.   Vena Austria 04/17/2012, 5:49 PM [phone]

## 2012-04-17 NOTE — Progress Notes (Signed)
Subjective:  No SOB, constipation improved.  Objective:  Vital Signs in the last 24 hours: Temp:  [97.2 F (36.2 C)-97.7 F (36.5 C)] 97.4 F (36.3 C) (01/03 0330) Pulse Rate:  [84-116] 116  (01/03 0841) Resp:  [18-20] 20  (01/03 0330) BP: (118-151)/(76-92) 133/83 mmHg (01/03 0841) SpO2:  [98 %-100 %] 99 % (01/03 0330) Weight:  [107.23 kg (236 lb 6.4 oz)] 107.23 kg (236 lb 6.4 oz) (01/03 0330)  Intake/Output from previous day:  Intake/Output Summary (Last 24 hours) at 04/17/12 0949 Last data filed at 04/17/12 0840  Gross per 24 hour  Intake   1080 ml  Output   1302 ml  Net   -222 ml    Physical Exam: General appearance: alert, cooperative and moderately obese Lungs: clear to auscultation bilaterally Heart: irregularly irregular rhythm   Rate: 90-150  Rhythm: atrial fibrillation  Lab Results:  Basename 04/16/12 0620  WBC 8.6  HGB 13.2  PLT 143*    Basename 04/16/12 0620  NA 141  K 4.3  CL 101  CO2 33*  GLUCOSE 192*  BUN 26*  CREATININE 1.42*   No results found for this basename: TROPONINI:2,CK,MB:2 in the last 72 hours Hepatic Function Panel No results found for this basename: PROT,ALBUMIN,AST,ALT,ALKPHOS,BILITOT,BILIDIR,IBILI in the last 72 hours No results found for this basename: CHOL in the last 72 hours  Basename 04/17/12 0533  INR 2.24*    Imaging: Imaging results have been reviewed  Cardiac Studies:  Assessment/Plan:   Principal Problem:  *Generalized weakness secondary to AF with SSS, pauses Diltiazem discontinued this admission.  Active Problems:  Atrial fibrillation with RVR, history of PAF, has had pauses & reported Bradycardia to 30s  CKD  stage 3, SCr 1.42 at discharge  LA thrombus on TEE Sep 2013  Chronic Diastolic Dysfunction -- exacerbated by Afib RVR; EF ~45%  Chronic anticoagulation  Hypertension  Prostate cancer  DM2 (diabetes mellitus, type 2)  Constipation, (Diltiazem discontinued)  Normal coronary arteries at cath  2009  Plan- Lopressor increased by Dr Thedore Mins, pt's HR elevated when up but he is  asymptomatic We will arrange OP monitor, INR, and follow up with Dr Rennis Golden. Possible discharge in am if rate stable overnight.    Corine Shelter PA-C 04/17/2012, 9:49 AM   I have seen and examined the patient along with Corine Shelter PA-C.  I have reviewed the chart, notes and new data.  I agree with PA's note.  Key new complaints: essentially asymptomatic Key examination changes: remains mildly tachycardic, beta blocker dose just increased Key new findings / data: INR 2.2  PLAN: Agree with higher beta blocker dose. Outpatient monitor. May need a single chamber pacemaker if he still has marked bradycardia episodes.  Thurmon Fair, MD, [facility] [phone] 04/17/2012, 10:42 AM

## 2012-04-17 NOTE — Progress Notes (Signed)
Patient's heart rate increased to the 160s then started sustaining in the 140s. No complaints and no signs or symptoms of discomfort or distress. BP is 133/83 and now HR is 116. MD and PA notified. No new orders at this time. PA with cardiology will follow up. Will continue to monitor patient for further changes in condition.

## 2012-04-18 LAB — GLUCOSE, CAPILLARY: Glucose-Capillary: 164 mg/dL — ABNORMAL HIGH (ref 70–99)

## 2012-04-18 MED ORDER — WARFARIN SODIUM 4 MG PO TABS
4.0000 mg | ORAL_TABLET | Freq: Once | ORAL | Status: DC
  Filled 2012-04-18: qty 1

## 2012-04-18 MED ORDER — ALLOPURINOL 100 MG PO TABS
100.0000 mg | ORAL_TABLET | Freq: Every day | ORAL | Status: DC
  Administered 2012-04-18: 100 mg via ORAL
  Filled 2012-04-18: qty 1

## 2012-04-18 MED ORDER — DOCUSATE SODIUM 100 MG PO CAPS: 100.0000 mg | ORAL_CAPSULE | Freq: Two times a day (BID) | ORAL | Status: DC | PRN

## 2012-04-18 NOTE — Discharge Summary (Addendum)
[facility]                                                                                   Vincent [name], is a 77 y.o. male  DOB 05/20/1935  MRN [id number].  Admission date:  04/13/2012  Discharge Date:  04/18/2012  Primary MD  [name], MD  Admitting Physician  Lars Mage, MD  Admission Diagnosis  constipation  Discharge Diagnosis     Principal Problem:  *Generalized weakness Active Problems:  Hypertension  Prostate cancer  Atrial fibrillation with rapid ventricular response, history of PAF; RVR currently rates 110s-140s; has had pauses & reported Bradycardia to 30s  DM2 (diabetes mellitus, type 2)  CKD (chronic kidney disease) stage 3, GFR 30-59 ml/min  LA thrombus  Chronic Diastolic Dysfunction -- exacerbated by Afib RVR; EF ~45%  Constipation  PAF (paroxysmal atrial fibrillation)  Chronic anticoagulation  Normal coronary arteries at cath 2009   Past Medical History  Diagnosis Date  . Hypertension   . CHF (congestive heart failure)     EF 40-45% by Echo, combined Systolic & Diastolic  . Irregular heartbeat   . Shortness of breath   . GERD (gastroesophageal reflux disease)   . Diastolic dysfunction, left ventricle 12/18/2011  . Atrial fibrillation with rapid ventricular response, history of PAF was in SR in 08/2011 12/18/2011  . Fecal impaction 04/13/2012  . Left atrial thrombus 12/2011    By TEE 12/2011  . Type II diabetes mellitus   . CKD (chronic kidney disease) stage 3, GFR 30-59 ml/min 12/26/2011  . Pneumonia 03/31/2012; 04/13/2012    Healthcare-associated pneumonia/notes 04/13/2012  . Prostate cancer   . PAF (paroxysmal atrial fibrillation) 04/15/2012    TEE in 12/2011 with LAA thrombus - no DCCV     Past Surgical History  Procedure Date  . Prostate surgery   . Tee without cardioversion 12/27/2011    Procedure: TRANSESOPHAGEAL ECHOCARDIOGRAM (TEE);  Surgeon: Chrystie Nose, MD;  Location: [facility] OR;  Service: Cardiovascular;  Laterality:  N/A;  MD request Main OR  . Cardioversion 12/27/2011    Procedure: CARDIOVERSION;  Surgeon: Chrystie Nose, MD;  Location: [facility] OR;  Service: Cardiovascular;  Laterality: N/A;  . Coronary angioplasty with stent placement      Recommendations for primary care physician for things to follow:   Please follow patient's blood pressure, heart rate, CBC BMP and INR closely.   Discharge Diagnoses:   Principal Problem:  *Generalized weakness Active Problems:  Hypertension  Prostate cancer  Atrial fibrillation with rapid ventricular response, history of PAF; RVR currently rates 110s-140s; has had pauses & reported Bradycardia to 30s  DM2 (diabetes mellitus, type 2)  CKD (chronic kidney disease) stage 3, GFR 30-59 ml/min  LA thrombus  Chronic Diastolic Dysfunction -- exacerbated by Afib RVR; EF ~45%  Constipation  PAF (paroxysmal atrial fibrillation)  Chronic anticoagulation  Normal coronary arteries at cath 2009    Discharge Condition: Stable   Diet recommendation: See Discharge Instructions below   Consults cardiology   History of present illness and  Hospital Course:  See H&P, Labs, Consult and Test reports for all details in brief, patient was admitted  for generalized weakness due to combination of deconditioning due to poor oral intake along with bradycardia due to combination of calcium channel and beta blockers, patient was treated with supportive care his Lasix was held he was given gentle IV fluids, his Cardizem has been discontinued, he is tolerating 100 mg of Lopressor well, was seen by PT, stable to go home with home health and home equipment which has been ordered, is now symptom-free and eager to go home, has been seen by his cardiologist who agreed with the medication changes, he did have few sinus pauses initially upon admission while he was on beta blocker and calcium channel blocker combination, since his cancer Gen. blocker has been discontinued he has tolerated present  dose Lopressor well without any further pauses. He was seen today by cardiologist cleared him to be discharged home with a monitor which will be mailed to his house. Of note his TSH was 1.6.   He has chronic diastolic CHF from which standpoint he is stable, his last EF is 45%.    Patient has chronic kidney disease stage III his baseline creatinine is 1.5 and he stable from that standpoint, he also had some constipation which has been relieved with bowel regimen, he is no abdominal pain and having regular bowel movements now.    He should have history of LV thrombus, initially his INR was subtherapeutic and he was given Lovenox for bridging, his INR is therapeutic now we'll request PCP to closely monitor his INR and Coumadin dose.   For his  diabetes mellitus type 2 we will continue his home medications as before.    Today   Subjective:   Vincent [name] today has no headache,no chest abdominal pain,no new weakness tingling or numbness, feels much better wants to go home today.    Objective:   Blood pressure 114/70, pulse 84, temperature 96.7 F (35.9 C), temperature source Oral, resp. rate 18, height 5' 8.9" (1.75 m), weight 106.323 kg (234 lb 6.4 oz), SpO2 100.00%.   Intake/Output Summary (Last 24 hours) at 04/18/12 1019 Last data filed at 04/18/12 0810  Gross per 24 hour  Intake   1160 ml  Output    925 ml  Net    235 ml    Exam Awake Alert, Oriented *3, No new F.N deficits, Normal affect [name].AT,PERRAL Supple Neck,No JVD, No cervical lymphadenopathy appriciated.  Symmetrical Chest wall movement, Good air movement bilaterally, CTAB i RRR,No Gallops,Rubs or new Murmurs, No Parasternal Heave +ve B.Sounds, Abd Soft, Non tender, No organomegaly appriciated, No rebound -guarding or rigidity. No Cyanosis, Clubbing or edema, No new Rash or bruise  Data Review   Major procedures and Radiology Reports - PLEASE review detailed and final reports for all details in brief -        Dg Chest 2 View  04/13/2012  *RADIOLOGY REPORT*  Clinical Data: Constipation; recent history of pneumonia.  CHEST - 2 VIEW  Comparison: Chest radiograph performed 04/09/2012  Findings: The lungs are well-aerated.  Minimal bibasilar atelectasis is noted.  There is no evidence of pleural effusion or pneumothorax.  A calcified granuloma is noted at the right lung apex.  Mild chronically increased interstitial markings are seen.  The heart is borderline normal in size; calcification is noted in the aortic arch.  No acute osseous abnormalities are seen.  Mild degenerative change is noted at the right humeral head.  IMPRESSION: Minimal bibasilar atelectasis noted; mild chronic lung changes seen.   Original Report Authenticated By: Tinnie Gens  Cherly Hensen, M.D.    Dg Chest 2 View  04/09/2012  *RADIOLOGY REPORT*  Clinical Data:  Pneumonia, follow-up, finishing antibiotics  CHEST - 2 VIEW  Comparison: 04/03/2012  Findings: Upper normal heart size. Atherosclerotic calcification aorta. Mediastinal contours and pulmonary vascularity normal. Bronchitic changes with decrease in perihilar markings versus previous study. Calcified granuloma right upper lobe. No acute infiltrate, pleural effusion or pneumothorax. Multilevel endplate spur formation thoracic spine.  IMPRESSION: Bronchitic changes with decreased accentuation perihilar markings versus previous study suggesting improved infiltrate.   Original Report Authenticated By: Ulyses Southward, M.D.    Dg Chest 2 View  03/31/2012  *RADIOLOGY REPORT*  Clinical Data: Shortness of breath, vomiting, altered mental status  CHEST - 2 VIEW  Comparison: 02/09/2012  Findings: Chronic interstitial markings.  No focal consolidation. No pleural effusion or pneumothorax.  Stable mild cardiomegaly.  Degenerative changes of the visualized thoracolumbar spine.  IMPRESSION: No evidence of acute cardiopulmonary disease.   Original Report Authenticated By: Charline Bills, M.D.    Dg Chest Port 1  View  04/03/2012  *RADIOLOGY REPORT*  Clinical Data: Hospital acquired pneumonia.  PORTABLE CHEST - 1 VIEW  Comparison: 03/31/2012  Findings: Single view of the chest was obtained.  Stable calcified granuloma in the right upper lung.  There are coarse lung markings suggesting chronic changes.  There are vague densities in the right upper lung and difficult to exclude subtle airspace disease.  Heart size is stable within normal limits.  No evidence for a pneumothorax.  IMPRESSION: Chronic lung changes.  Difficult to exclude subtle acute disease in the right lung.   Original Report Authenticated By: Richarda Overlie, M.D.     Micro Results      No results found for this or any previous visit (from the past 240 hour(s)).   CBC w Diff: Lab Results  Component Value Date   WBC 8.6 04/16/2012   HGB 13.2 04/16/2012   HCT 41.6 04/16/2012   PLT 143* 04/16/2012   LYMPHOPCT 13 04/13/2012   MONOPCT 11 04/13/2012   EOSPCT 2 04/13/2012   BASOPCT 0 04/13/2012    CMP: Lab Results  Component Value Date   NA 141 04/16/2012   K 4.3 04/16/2012   CL 101 04/16/2012   CO2 33* 04/16/2012   BUN 26* 04/16/2012   CREATININE 1.42* 04/16/2012   PROT 6.9 04/13/2012   ALBUMIN 3.2* 04/13/2012   BILITOT 0.6 04/13/2012   ALKPHOS 127* 04/13/2012   AST 49* 04/13/2012   ALT 58* 04/13/2012  .   Discharge Instructions     Follow with Primary MD [name], MD in 3 days   Get CBC, CMP, INR checked 3 days by Primary MD and again as instructed by your Primary MD. Get a 2 view Chest X ray done next visit..  Get Medicines reviewed and adjusted.  Please request your Prim.MD to go over all Hospital Tests and Procedure/Radiological results at the follow up, please get all Hospital records sent to your Prim MD by signing hospital release before you go home.  Activity: As tolerated with Full fall precautions use walker/cane & assistance as needed   Diet:  Heart Healthy low carbohydrate, Fluid restriction 1.8 lit/day, Aspiration  precautions.  Check your Weight same time everyday, if you gain over 2 pounds, or you develop in leg swelling, experience more shortness of breath or chest pain, call your Primary MD immediately. Follow Cardiac Low Salt Diet and 1.8 lit/day fluid restriction.  Disposition Home   If you experience worsening  of your admission symptoms, develop shortness of breath, life threatening emergency, suicidal or homicidal thoughts you must seek medical attention immediately by calling 911 or calling your MD immediately  if symptoms less severe.  You Must read complete instructions/literature along with all the possible adverse reactions/side effects for all the Medicines you take and that have been prescribed to you. Take any new Medicines after you have completely understood and accpet all the possible adverse reactions/side effects.   Do not drive and provide baby sitting services if your were admitted for syncope or siezures until you have seen by Primary MD or a Neurologist and advised to do so again.  Do not drive when taking Pain medications.    Do not take more than prescribed Pain, Sleep and Anxiety Medications  Special Instructions: If you have smoked or chewed Tobacco  in the last 2 yrs please stop smoking, stop any regular Alcohol  and or any Recreational drug use.  Wear Seat belts while driving.   Follow-up Information    Follow up with Chrystie Nose, MD. On 04/27/2012. (to have heart monitor 11:30)    Contact information:   [address] [place] [address] [phone]       Follow up with Chrystie Nose, MD. On 05/04/2012. (at 4:15pm  )    Contact information:   [address] [place] [address] [phone]       Follow up with Chrystie Nose, MD. On 04/21/2012. (at 8:45 am for coumadin clinic appt.)    Contact information:   [address] [place] [address] [phone]       Follow up with [name], MD.  Schedule an appointment as soon as possible for a visit in 3 days.   Contact information:   [address]       Follow up with [facility]. (Home Health)    Contact information:   [phone]           Discharge Medications     Medication List     As of 04/18/2012 10:19 AM    START taking these medications         docusate sodium 100 MG capsule   Commonly known as: COLACE   Take 1 capsule (100 mg total) by mouth 2 (two) times daily as needed for constipation.      metoprolol 100 MG tablet   Commonly known as: LOPRESSOR   Take 1 tablet (100 mg total) by mouth 2 (two) times daily.      CHANGE how you take these medications         furosemide 40 MG tablet   Commonly known as: LASIX   Take 1 tablet (40 mg total) by mouth 2 (two) times daily.   What changed: medication strength      potassium chloride 10 MEQ tablet   Commonly known as: K-DUR,KLOR-CON   Take 2 tablets (20 mEq total) by mouth daily.   What changed: how often to take the med      CONTINUE taking these medications         allopurinol 100 MG tablet   Commonly known as: ZYLOPRIM   Take 1.5 tablets (150 mg total) by mouth every morning.      insulin aspart protamine-insulin aspart (70-30) 100 UNIT/ML injection   Commonly known as: NOVOLOG 70/30      polyethylene glycol packet   Commonly known as: MIRALAX / GLYCOLAX  warfarin 4 MG tablet   Commonly known as: COUMADIN      STOP taking these medications         atenolol 100 MG tablet   Commonly known as: TENORMIN      diltiazem 300 MG 24 hr capsule   Commonly known as: TIAZAC          Where to get your medications    These are the prescriptions that you need to pick up. We sent them to a specific pharmacy, so you will need to go there to get them.   [id number] - [place],  - [address] AT [address] OF [facility] [address]    [address] [place] [address]    Phone: [phone]         furosemide 40 MG tablet   metoprolol 100 MG tablet   potassium chloride 10 MEQ tablet         Information on where to get these meds is not yet available. Ask your nurse or doctor.         docusate sodium 100 MG capsule               Total Time in preparing paper work, data evaluation and todays exam - 35 minutes  Leroy Sea M.D on 04/18/2012 at 10:19 AM  [facility] Office  [phone]

## 2012-04-18 NOTE — Progress Notes (Signed)
ANTICOAGULATION CONSULT NOTE - Follow up Consult  Pharmacy Consult for Coumadin Indication: h/o Afib/LA thrombus  No Known Allergies  Patient Measurements: Height: 5' 8.9" (175 cm) Weight: 234 lb 6.4 oz (106.323 kg) (scale b) IBW/kg (Calculated) : 70.47   Vital Signs: Temp: 96.7 F (35.9 C) (01/04 0457) Temp src: Oral (01/04 0457) BP: 114/70 mmHg (01/04 0910) Pulse Rate: 114  (01/04 0910)  Labs:  Basename 04/18/12 0604 04/17/12 0533 04/16/12 0620  HGB -- -- 13.2  HCT -- -- 41.6  PLT -- -- 143*  APTT -- -- --  LABPROT 24.1* 23.8* 21.0*  INR 2.28* 2.24* 1.89*  HEPARINUNFRC -- -- --  CREATININE -- -- 1.42*  CKTOTAL -- -- --  CKMB -- -- --  TROPONINI -- -- --    Estimated Creatinine Clearance: 54.7 ml/min (by C-G formula based on Cr of 1.42).   Medical History: Past Medical History  Diagnosis Date  . Hypertension   . CHF (congestive heart failure)     EF 40-45% by Echo, combined Systolic & Diastolic  . Irregular heartbeat   . Shortness of breath   . GERD (gastroesophageal reflux disease)   . Diastolic dysfunction, left ventricle 12/18/2011  . Atrial fibrillation with rapid ventricular response, history of PAF was in SR in 08/2011 12/18/2011  . Fecal impaction 04/13/2012  . Left atrial thrombus 12/2011    By TEE 12/2011  . Type II diabetes mellitus   . CKD (chronic kidney disease) stage 3, GFR 30-59 ml/min 12/26/2011  . Pneumonia 03/31/2012; 04/13/2012    Healthcare-associated pneumonia/notes 04/13/2012  . Prostate cancer   . PAF (paroxysmal atrial fibrillation) 04/15/2012    TEE in 12/2011 with LAA thrombus - no DCCV     Medications:  Prescriptions prior to admission  Medication Sig Dispense Refill  . allopurinol (ZYLOPRIM) 100 MG tablet Take 1.5 tablets (150 mg total) by mouth every morning.  30 tablet  1  . insulin aspart protamine-insulin aspart (NOVOLOG 70/30) (70-30) 100 UNIT/ML injection Inject 20 Units into the skin 2 (two) times daily with a meal.      .  polyethylene glycol (MIRALAX / GLYCOLAX) packet Take 17 g by mouth daily as needed. For constipation      . warfarin (COUMADIN) 4 MG tablet Take 4 mg by mouth every evening.      . [DISCONTINUED] atenolol (TENORMIN) 100 MG tablet Take 1 tablet (100 mg total) by mouth 2 (two) times daily.  60 tablet  1  . [DISCONTINUED] diltiazem (TIAZAC) 300 MG 24 hr capsule Take 1 capsule (300 mg total) by mouth every morning.  30 capsule  1  . [DISCONTINUED] furosemide (LASIX) 80 MG tablet Take 0.5 tablets (40 mg total) by mouth 2 (two) times daily.  30 tablet  1  . [DISCONTINUED] potassium chloride (K-DUR,KLOR-CON) 10 MEQ tablet Take 2 tablets (20 mEq total) by mouth 2 (two) times daily.  60 tablet  1    Assessment: 77 y/o male patient with h/o LA thrombus and afib on chronic coumadin for anticoagulation. On Coumadin 4mg  daily at home. INR is currently therapeutic and currently stable. Ok to, will continue home dosage. No bleeding noted.     Goal of Therapy:  INR 2-3 Monitor platelets by anticoagulation protocol: Yes    Plan:  Coumadin 4mg  today F/u daily protime   Vania Rea. Darin Engels.D. Clinical Pharmacist Pager [phone] Phone [phone] 04/18/2012 10:06 AM

## 2012-04-18 NOTE — Progress Notes (Signed)
04/18/2012 1600 NCM faxed facesheet and orders to [facility] for scheduled dc home today. Isidoro Donning RN CCM Case Mgmt phone [phone]

## 2012-04-18 NOTE — Progress Notes (Signed)
Subjective: Wants to go home  Objective: Vital signs in last 24 hours: Temp:  [96.7 F (35.9 C)-97 F (36.1 C)] 96.7 F (35.9 C) (01/04 0457) Pulse Rate:  [82-114] 114  (01/04 0910) Resp:  [18-20] 18  (01/04 0457) BP: (114-137)/(69-78) 114/70 mmHg (01/04 0910) SpO2:  [100 %] 100 % (01/04 0457) Weight:  [106.323 kg (234 lb 6.4 oz)] 106.323 kg (234 lb 6.4 oz) (01/04 0457) Weight change: -0.907 kg (-2 lb) Last BM Date: 04/17/12 Intake/Output from previous day: -6 01/03 0701 - 01/04 0700 In: 1040 [P.O.:1040] Out: 926 [Urine:925; Stool:1] Intake/Output this shift: Total I/O In: 360 [P.O.:360] Out: -   PE: General:alert and oriented NAD Heart:irreg irreg Lungs:clear Abd:+ BS soft, non tender Ext:no edema    Lab Results:  [facility] 04/16/12 0620  WBC 8.6  HGB 13.2  HCT 41.6  PLT 143*   BMET  Basename 04/16/12 0620  NA 141  K 4.3  CL 101  CO2 33*  GLUCOSE 192*  BUN 26*  CREATININE 1.42*  CALCIUM 9.1   No results found for this basename: TROPONINI:2,CK,MB:2 in the last 72 hours  Lab Results  Component Value Date   CHOL 126 12/19/2011   HDL 26* 12/19/2011   LDLCALC 70 12/19/2011   TRIG 148 12/19/2011   CHOLHDL 4.8 12/19/2011   Lab Results  Component Value Date   HGBA1C 9.1* 04/01/2012     Lab Results  Component Value Date   TSH 1.659 04/14/2012      Studies/Results: No results found.  Medications: I have reviewed the patient's current medications.    Marland Kitchen allopurinol  100 mg Oral Daily  . furosemide  40 mg Oral BID  . insulin aspart  0-9 Units Subcutaneous TID WC  . insulin aspart protamine-insulin aspart  20 Units Subcutaneous BID WC  . metoprolol tartrate  100 mg Oral BID  . polyethylene glycol  17 g Oral Daily  . potassium chloride  20 mEq Oral BID  . Warfarin - Pharmacist Dosing Inpatient   Does not apply q1800   Assessment/Plan: Principal Problem:  *Generalized weakness Active Problems:  Hypertension  Prostate cancer  Atrial fibrillation with  rapid ventricular response, history of PAF; RVR currently rates 110s-140s; has had pauses & reported Bradycardia to 30s  DM2 (diabetes mellitus, type 2)  CKD (chronic kidney disease) stage 3, GFR 30-59 ml/min  LA thrombus  Chronic Diastolic Dysfunction -- exacerbated by Afib RVR; EF ~45%  Constipation  Chronic anticoagulation  Normal coronary arteries at cath 2009  PLAN: HR continues elevated, back on metoprolol at 100 mg BID, on coumadin- INR 2.28. Pt has refused PPM in past.    LOS: 5 days   INGOLD,LAURA R 04/18/2012, 9:13 AM   I have seen and examined the patient along with [facility] R, NP.  I have reviewed the chart, notes and new data.  I agree with NP's note.  Right now, heart rate is 80-90 bpm. For the most part, resting heart rate is well controlled on the current beta blocker dose. No bradycardia or pauses.  PLAN: Pacemake not clearly indicated. Outpatient [facility] will help with AV node blocker dose titration. Avoid combination of verapamil/diltiazem and beta blockers. I had a three way conversation with Aravind and his wife re: compliance with meds, diet and medical follow-up, including a detailed discussion re: warfarin management.  Thurmon Fair, MD, [facility] [phone] 04/18/2012, 10:16 AM

## 2012-05-26 ENCOUNTER — Encounter (HOSPITAL_COMMUNITY): Payer: Self-pay | Admitting: General Practice

## 2012-05-26 ENCOUNTER — Inpatient Hospital Stay (HOSPITAL_COMMUNITY)
Admission: AD | Admit: 2012-05-26 | Discharge: 2012-05-29 | DRG: 243 | Disposition: A | Discharge status: Home / Self Care | Payer: Medicare Other | Source: Ambulatory Visit | Attending: Internal Medicine | Admitting: Internal Medicine

## 2012-05-26 DIAGNOSIS — K219 Gastro-esophageal reflux disease without esophagitis: Secondary | ICD-10-CM | POA: Diagnosis present

## 2012-05-26 DIAGNOSIS — C61 Malignant neoplasm of prostate: Secondary | ICD-10-CM | POA: Diagnosis present

## 2012-05-26 DIAGNOSIS — G4733 Obstructive sleep apnea (adult) (pediatric): Secondary | ICD-10-CM | POA: Diagnosis present

## 2012-05-26 DIAGNOSIS — I4891 Unspecified atrial fibrillation: Principal | ICD-10-CM | POA: Diagnosis present

## 2012-05-26 DIAGNOSIS — E119 Type 2 diabetes mellitus without complications: Secondary | ICD-10-CM | POA: Diagnosis present

## 2012-05-26 DIAGNOSIS — I495 Sick sinus syndrome: Secondary | ICD-10-CM | POA: Diagnosis present

## 2012-05-26 DIAGNOSIS — I129 Hypertensive chronic kidney disease with stage 1 through stage 4 chronic kidney disease, or unspecified chronic kidney disease: Secondary | ICD-10-CM | POA: Diagnosis present

## 2012-05-26 DIAGNOSIS — I10 Essential (primary) hypertension: Secondary | ICD-10-CM | POA: Diagnosis present

## 2012-05-26 DIAGNOSIS — K5900 Constipation, unspecified: Secondary | ICD-10-CM | POA: Diagnosis present

## 2012-05-26 DIAGNOSIS — I5032 Chronic diastolic (congestive) heart failure: Secondary | ICD-10-CM | POA: Diagnosis present

## 2012-05-26 DIAGNOSIS — Z9861 Coronary angioplasty status: Secondary | ICD-10-CM

## 2012-05-26 DIAGNOSIS — I513 Intracardiac thrombosis, not elsewhere classified: Secondary | ICD-10-CM | POA: Diagnosis present

## 2012-05-26 DIAGNOSIS — I509 Heart failure, unspecified: Secondary | ICD-10-CM | POA: Diagnosis present

## 2012-05-26 DIAGNOSIS — Z7901 Long term (current) use of anticoagulants: Secondary | ICD-10-CM

## 2012-05-26 DIAGNOSIS — IMO0001 Reserved for inherently not codable concepts without codable children: Secondary | ICD-10-CM

## 2012-05-26 DIAGNOSIS — N183 Chronic kidney disease, stage 3 unspecified: Secondary | ICD-10-CM | POA: Diagnosis present

## 2012-05-26 HISTORY — DX: Sick sinus syndrome: I49.5

## 2012-05-26 HISTORY — DX: Unspecified osteoarthritis, unspecified site: M19.90

## 2012-05-26 HISTORY — DX: Bradycardia, unspecified: R00.1

## 2012-05-26 LAB — COMPREHENSIVE METABOLIC PANEL
ALT: 14 U/L (ref 0–53)
AST: 25 U/L (ref 0–37)
Albumin: 3.2 g/dL — ABNORMAL LOW (ref 3.5–5.2)
Alkaline Phosphatase: 98 U/L (ref 39–117)
BUN: 19 mg/dL (ref 6–23)
CO2: 32 mEq/L (ref 19–32)
Calcium: 9.2 mg/dL (ref 8.4–10.5)
Chloride: 99 mEq/L (ref 96–112)
Creatinine, Ser: 1.4 mg/dL — ABNORMAL HIGH (ref 0.50–1.35)
GFR calc Af Amer: 55 mL/min — ABNORMAL LOW (ref >90)
GFR calc non Af Amer: 48 mL/min — ABNORMAL LOW (ref >90)
Glucose, Bld: 359 mg/dL — ABNORMAL HIGH (ref 70–99)
Potassium: 3.4 mEq/L — ABNORMAL LOW (ref 3.5–5.1)
Sodium: 140 mEq/L (ref 135–145)
Total Bilirubin: 0.9 mg/dL (ref 0.3–1.2)
Total Protein: 7 g/dL (ref 6.0–8.3)

## 2012-05-26 LAB — CBC
HCT: 38.2 % — ABNORMAL LOW (ref 39.0–52.0)
Hemoglobin: 12.1 g/dL — ABNORMAL LOW (ref 13.0–17.0)
MCH: 30 pg (ref 26.0–34.0)
MCHC: 31.7 g/dL (ref 30.0–36.0)
MCV: 94.8 fL (ref 78.0–100.0)
Platelets: 207 10*3/uL (ref 150–400)
RBC: 4.03 MIL/uL — ABNORMAL LOW (ref 4.22–5.81)
RDW: 16.9 % — ABNORMAL HIGH (ref 11.5–15.5)
WBC: 9.3 10*3/uL (ref 4.0–10.5)

## 2012-05-26 LAB — GLUCOSE, CAPILLARY
Glucose-Capillary: 318 mg/dL — ABNORMAL HIGH (ref 70–99)
Glucose-Capillary: 255 mg/dL — ABNORMAL HIGH (ref 70–99)

## 2012-05-26 LAB — URINALYSIS, ROUTINE W REFLEX MICROSCOPIC
Bilirubin Urine: NEGATIVE
Glucose, UA: 500 mg/dL — AB
Ketones, ur: NEGATIVE mg/dL
Leukocytes, UA: NEGATIVE
Nitrite: NEGATIVE
Protein, ur: 30 mg/dL — AB
Specific Gravity, Urine: 1.012 (ref 1.005–1.030)
Urobilinogen, UA: 1 mg/dL (ref 0.0–1.0)
pH: 6.5 (ref 5.0–8.0)

## 2012-05-26 LAB — URINE MICROSCOPIC-ADD ON

## 2012-05-26 LAB — TSH: TSH: 2.574 u[IU]/mL (ref 0.350–4.500)

## 2012-05-26 LAB — MAGNESIUM: Magnesium: 1.6 mg/dL (ref 1.5–2.5)

## 2012-05-26 LAB — PROTIME-INR
INR: 2.18 — ABNORMAL HIGH (ref 0.00–1.49)
Prothrombin Time: 23.3 seconds — ABNORMAL HIGH (ref 11.6–15.2)

## 2012-05-26 MED ORDER — INSULIN ASPART PROT & ASPART (70-30 MIX) 100 UNIT/ML ~~LOC~~ SUSP
10.0000 [IU] | Freq: Once | SUBCUTANEOUS | Status: AC
  Administered 2012-05-26: 10 [IU] via SUBCUTANEOUS
  Filled 2012-05-26: qty 10

## 2012-05-26 MED ORDER — DIGOXIN 125 MCG PO TABS
0.1250 mg | ORAL_TABLET | Freq: Every day | ORAL | Status: DC
  Filled 2012-05-26: qty 1

## 2012-05-26 MED ORDER — POLYETHYLENE GLYCOL 3350 17 G PO PACK
17.0000 g | PACK | Freq: Every day | ORAL | Status: DC | PRN
  Filled 2012-05-26: qty 1

## 2012-05-26 MED ORDER — FUROSEMIDE 40 MG PO TABS
40.0000 mg | ORAL_TABLET | Freq: Two times a day (BID) | ORAL | Status: DC
  Administered 2012-05-26 – 2012-05-29 (×6): 40 mg via ORAL
  Filled 2012-05-26 (×8): qty 1

## 2012-05-26 MED ORDER — INSULIN ASPART 100 UNIT/ML ~~LOC~~ SOLN
0.0000 [IU] | Freq: Every day | SUBCUTANEOUS | Status: DC
  Administered 2012-05-26: 3 [IU] via SUBCUTANEOUS
  Administered 2012-05-27 (×2): 2 [IU] via SUBCUTANEOUS
  Administered 2012-05-28: 3 [IU] via SUBCUTANEOUS

## 2012-05-26 MED ORDER — DILTIAZEM LOAD VIA INFUSION
20.0000 mg | Freq: Once | INTRAVENOUS | Status: AC
  Administered 2012-05-26: 20 mg via INTRAVENOUS
  Filled 2012-05-26: qty 20

## 2012-05-26 MED ORDER — INSULIN ASPART PROT & ASPART (70-30 MIX) 100 UNIT/ML ~~LOC~~ SUSP
20.0000 [IU] | Freq: Two times a day (BID) | SUBCUTANEOUS | Status: DC
  Administered 2012-05-27 – 2012-05-29 (×4): 20 [IU] via SUBCUTANEOUS
  Filled 2012-05-26: qty 10

## 2012-05-26 MED ORDER — POTASSIUM CHLORIDE CRYS ER 20 MEQ PO TBCR
20.0000 meq | EXTENDED_RELEASE_TABLET | Freq: Two times a day (BID) | ORAL | Status: DC
  Administered 2012-05-26 – 2012-05-29 (×6): 20 meq via ORAL
  Filled 2012-05-26 (×8): qty 1

## 2012-05-26 MED ORDER — INSULIN ASPART 100 UNIT/ML ~~LOC~~ SOLN
0.0000 [IU] | Freq: Three times a day (TID) | SUBCUTANEOUS | Status: DC
  Administered 2012-05-26: 11 [IU] via SUBCUTANEOUS
  Administered 2012-05-27: 3 [IU] via SUBCUTANEOUS
  Administered 2012-05-27: 5 [IU] via SUBCUTANEOUS
  Administered 2012-05-28: 3 [IU] via SUBCUTANEOUS
  Administered 2012-05-28: 8 [IU] via SUBCUTANEOUS
  Administered 2012-05-28 – 2012-05-29 (×2): 3 [IU] via SUBCUTANEOUS
  Administered 2012-05-29 (×2): 5 [IU] via SUBCUTANEOUS

## 2012-05-26 MED ORDER — DILTIAZEM HCL 100 MG IV SOLR
5.0000 mg/h | INTRAVENOUS | Status: DC
  Administered 2012-05-26 – 2012-05-28 (×4): 10 mg/h via INTRAVENOUS
  Filled 2012-05-26 (×3): qty 100

## 2012-05-26 MED ORDER — ATENOLOL 100 MG PO TABS: 100.0000 mg | ORAL_TABLET | Freq: Two times a day (BID) | ORAL | Status: DC

## 2012-05-26 MED ORDER — ALLOPURINOL 150 MG HALF TABLET
150.0000 mg | ORAL_TABLET | Freq: Every morning | ORAL | Status: DC
Start: 2012-05-27 — End: 2012-05-29
  Administered 2012-05-27 – 2012-05-29 (×3): 150 mg via ORAL
  Filled 2012-05-26 (×3): qty 1

## 2012-05-26 MED ORDER — DOCUSATE SODIUM 100 MG PO CAPS
100.0000 mg | ORAL_CAPSULE | Freq: Two times a day (BID) | ORAL | Status: DC
  Administered 2012-05-26 – 2012-05-28 (×4): 100 mg via ORAL
  Filled 2012-05-26 (×9): qty 1

## 2012-05-26 NOTE — H&P (Signed)
Patient ID: ISAC LINCKS MRN: [id number], DOB/AGE: 1935-09-01   Admit date: (Not on file)   Primary Physician: [name], MD Primary Cardiologist: Dr Rennis Golden  HPI:  See Corliss Blacker office note from 05/25/12 for complete H&P. 77 y/o with a history of PAF with SSS. He is on Coumadin with a history of LAA thrombus. He was admitted earlier in Jan 2014 with PAF. He was seen by Dr Royann Shivers then. At that time he was not felt to be a candidate for a pacemaker. He was seen 05/25/12 after he was noted to have rapid AF on Holter. Lanoxin was added and he is seen now fdor follow up. He remains in rapid AF with VR of 150. He is tolerating this well. Dr Rennis Golden has seen him with me and feels he should be admitted for rate control and TEE/CV in am- to be done in the OR secondary to severe sleep apnea.   Problem List: Past Medical History  Diagnosis Date  . Hypertension   . CHF (congestive heart failure)     EF 40-45% by Echo, combined Systolic & Diastolic  . Irregular heartbeat   . Shortness of breath   . GERD (gastroesophageal reflux disease)   . Diastolic dysfunction, left ventricle 12/18/2011  . Atrial fibrillation with rapid ventricular response, history of PAF was in SR in 08/2011 12/18/2011  . Fecal impaction 04/13/2012  . Left atrial thrombus 12/2011    By TEE 12/2011  . Type II diabetes mellitus   . CKD (chronic kidney disease) stage 3, GFR 30-59 ml/min 12/26/2011  . Pneumonia 03/31/2012; 04/13/2012    Healthcare-associated pneumonia/notes 04/13/2012  . Prostate cancer   . PAF (paroxysmal atrial fibrillation) 04/15/2012    TEE in 12/2011 with LAA thrombus - no DCCV     Past Surgical History  Procedure Laterality Date  . Prostate surgery    . Tee without cardioversion  12/27/2011    Procedure: TRANSESOPHAGEAL ECHOCARDIOGRAM (TEE);  Surgeon: Chrystie Nose, MD;  Location: [facility] OR;  Service: Cardiovascular;  Laterality: N/A;  MD request Main OR  . Cardioversion  12/27/2011    Procedure:  CARDIOVERSION;  Surgeon: Chrystie Nose, MD;  Location: [facility] OR;  Service: Cardiovascular;  Laterality: N/A;  . Coronary angioplasty with stent placement       Allergies: No Known Allergies   Home Medications No prescriptions prior to admission     Family History  Problem Relation Age of Onset  . Diabetes Mellitus II Mother   . Sudden death Mother   . Prostate cancer Father      History   Social History  . Marital Status: Single    Spouse Name: N/A    Number of Children: N/A  . Years of Education: N/A   Occupational History  . Not on file.   Social History Main Topics  . Smoking status: Never Smoker   . Smokeless tobacco: Never Used  . Alcohol Use: No  . Drug Use: No  . Sexually Active: Not Currently   Other Topics Concern  . Not on file   Social History Narrative  . No narrative on file     Review of Systems: General: negative for chills, fever, night sweats or weight changes.  Cardiovascular: negative for chest pain, dyspnea on exertion, edema, orthopnea, palpitations, paroxysmal nocturnal dyspnea or shortness of breath Dermatological: negative for rash Respiratory: negative for cough or wheezing. He did have URI Rx'd with steroids and ABs 12/13 Urologic: negative for hematuria Abdominal: negative  for nausea, vomiting, diarrhea, bright red blood per rectum, melena, or hematemesis, he has had chronic constipation. Seen in ER 12/13 with fecal impaction. Neurologic: negative for visual changes, syncope, or dizziness All other systems reviewed and are otherwise negative except as noted above.  Physical Exam: There were no vitals taken for this visit.  General appearance: alert, cooperative and morbidly obese Neck: no carotid bruit and no JVD Lungs: rales bases and diffuse crackles Heart: irregularly irregular rhythm Abdomen: obese Extremities: trace edema bilat Pulses: 2+ and symmetric Skin: Skin color, texture, turgor normal. No rashes or  lesions Neurologic: Grossly normal    Labs:  No results found for this or any previous visit (from the past 24 hour(s)).   Radiology/Studies: No results found.  ZOX:WRUEA AF  ASSESSMENT AND PLAN:  Principal Problem:   Atrial fibrillation with rapid ventricular response, history of PAF; RVR currently rates 110s-140s; has had pauses & reported Bradycardia to 30s Active Problems:   CKD (chronic kidney disease) stage 3, GFR 30-59 ml/min   LA thrombus   Chronic Diastolic Dysfunction -- exacerbated by Afib RVR; EF ~45%   Chronic anticoagulation   Hypertension   Prostate cancer   DM2 (diabetes mellitus, type 2)   Constipation   Normal coronary arteries at cath 2009   Obstructive sleep apnea, C-pap intol  Plan- admit to stepdown, IV Diltiazem for rate control, TEE/CV in am in the OR with Dr Rennis Golden. He may end up with a pacemaker this admission.  Deland Pretty, PA-C 05/26/2012, 11:01 AM

## 2012-05-26 NOTE — Consult Note (Signed)
ANTICOAGULATION CONSULT NOTE - Initial Consult  Pharmacy Consult for Coumadin Indication: A-flutter w/RVR, history of LA thrombus  Allergies: No Known Allergies  Height/Weight: Height: 5\' 9"  (175.3 cm) Weight: 241 lb (109.317 kg) IBW/kg (Calculated) : 70.7  Vital Signs: BP 123/83  Pulse 79  Temp(Src) 97.5 F (36.4 C) (Oral)  Resp 18  Ht 5\' 9"  (1.753 m)  Wt 241 lb (109.317 kg)  BMI 35.57 kg/m2  SpO2 99%  Active Problems: Principal Problem:   Atrial fibrillation with rapid ventricular response, history of PAF; RVR currently rates 110s-140s; has had pauses & reported Bradycardia to 30s Active Problems:   Hypertension   Prostate cancer   DM2 (diabetes mellitus, type 2)   CKD (chronic kidney disease) stage 3, GFR 30-59 ml/min   LA thrombus   Chronic Diastolic Dysfunction -- exacerbated by Afib RVR; EF ~45%   Constipation   Chronic anticoagulation   Normal coronary arteries at cath 2009   Obstructive sleep apnea, C-pap intol   Labs: Coag labs Pending  Lab Results  Component Value Date   INR 2.28* 04/18/2012   INR 2.24* 04/17/2012   INR 1.89* 04/16/2012   Estimated Creatinine Clearance: 54.7 ml/min (by C-G formula based on Cr of 1.42).  Medical / Surgical History: Past Medical History  Diagnosis Date  . Hypertension   . CHF (congestive heart failure)     EF 40-45% by Echo, combined Systolic & Diastolic  . Irregular heartbeat   . Shortness of breath   . GERD (gastroesophageal reflux disease)   . Diastolic dysfunction, left ventricle 12/18/2011  . Atrial fibrillation with rapid ventricular response, history of PAF was in SR in 08/2011 12/18/2011  . Fecal impaction 04/13/2012  . Left atrial thrombus 12/2011    By TEE 12/2011  . Type II diabetes mellitus   . CKD (chronic kidney disease) stage 3, GFR 30-59 ml/min 12/26/2011  . Pneumonia 03/31/2012; 04/13/2012    Healthcare-associated pneumonia/notes 04/13/2012  . Prostate cancer   . PAF (paroxysmal atrial fibrillation)  04/15/2012    TEE in 12/2011 with LAA thrombus - no DCCV    Past Surgical History  Procedure Laterality Date  . Prostate surgery    . Tee without cardioversion  12/27/2011    Procedure: TRANSESOPHAGEAL ECHOCARDIOGRAM (TEE);  Surgeon: Chrystie Nose, MD;  Location: [facility] OR;  Service: Cardiovascular;  Laterality: N/A;  MD request Main OR  . Cardioversion  12/27/2011    Procedure: CARDIOVERSION;  Surgeon: Chrystie Nose, MD;  Location: [facility] OR;  Service: Cardiovascular;  Laterality: N/A;  . Coronary angioplasty with stent placement      Medications:  Prescriptions prior to admission  Medication Sig Dispense Refill  . allopurinol (ZYLOPRIM) 100 MG tablet Take 1.5 tablets (150 mg total) by mouth every morning.  30 tablet  1  . digoxin (DIGOX) 0.25 MG tablet Take 0.25 mg by mouth daily.      . furosemide (LASIX) 40 MG tablet Take 1 tablet (40 mg total) by mouth 2 (two) times daily.  30 tablet  2  . ibuprofen (ADVIL,MOTRIN) 200 MG tablet Take 200 mg by mouth every 6 (six) hours as needed for pain.      Marland Kitchen insulin aspart protamine-insulin aspart (NOVOLOG 70/30) (70-30) 100 UNIT/ML injection Inject 20 Units into the skin 2 (two) times daily with a meal.      . metoprolol (LOPRESSOR) 100 MG tablet Take 1 tablet (100 mg total) by mouth 2 (two) times daily.  60 tablet  0  .  polyethylene glycol (MIRALAX / GLYCOLAX) packet Take 17 g by mouth daily as needed. For constipation      . potassium chloride (K-DUR,KLOR-CON) 10 MEQ tablet Take 2 tablets (20 mEq total) by mouth daily.  60 tablet  1  . warfarin (COUMADIN) 4 MG tablet Take 4 mg by mouth daily.        Assessment:  77 y.o.male admitted for Atrial fibrillation with rapid ventricular response who is on chronic Coumadin.  He takes Coumadin 4 mg daily and has already taken today's scheduled dose.  Baseline INR pending.  Goal of Therapy:   INR 2-3      Plan:   Follow up INR and adjust tomorrow's Coumadin dose as indicated. Daily INR's, CBC.    Wladyslaw Henrichs, Elisha Headland, Pharm.D. 05/26/2012,  2:07 PM

## 2012-05-26 NOTE — Progress Notes (Signed)
Utilization review completed.  

## 2012-05-26 NOTE — H&P (Signed)
Pt. Seen and examined. Agree with the NP/PA-C note as written.  Mr. Pennella has recurrent atrial flutter with rapid ventricular response, for which he is generally unaware of. Unfortunately, at his recent hospitalization he had bradycardia with HR's in the 30's necessitating the discontinuation of rate-controlling meds.  HR now in the office today is in the 180's, despite the addition of digoxin.  Discussed with the patient about admission and he is agreeable to this. Plan admit for rate control. Keep NPO p MN for TEE and possible cardioversion tomorrow (he had LA thrombus in 12/2011 - will need to be done under anesthesia in the OR due to OSA and high-risk airway).  If he does not have thrombus, will plan cardioversion.  Continue warfarin. Plan probable pacemaker on Thursday with Dr. Royann Shivers, for tachy-brady syndrome.  Chrystie Nose, MD, [facility] Attending Cardiologist [facility]

## 2012-05-27 ENCOUNTER — Encounter (HOSPITAL_COMMUNITY)
Admission: AD | Disposition: A | Discharge status: Home / Self Care | Payer: Self-pay | Source: Ambulatory Visit | Attending: Internal Medicine

## 2012-05-27 ENCOUNTER — Encounter (HOSPITAL_COMMUNITY): Payer: Self-pay | Admitting: Anesthesiology

## 2012-05-27 ENCOUNTER — Inpatient Hospital Stay (HOSPITAL_COMMUNITY): Payer: Medicare Other | Admitting: Anesthesiology

## 2012-05-27 HISTORY — PX: CARDIOVERSION: SHX1299

## 2012-05-27 LAB — GLUCOSE, CAPILLARY
Glucose-Capillary: 213 mg/dL — ABNORMAL HIGH (ref 70–99)
Glucose-Capillary: 203 mg/dL — ABNORMAL HIGH (ref 70–99)

## 2012-05-27 LAB — PROTIME-INR
INR: 2.29 — ABNORMAL HIGH (ref 0.00–1.49)
Prothrombin Time: 24.2 seconds — ABNORMAL HIGH (ref 11.6–15.2)

## 2012-05-27 SURGERY — CARDIOVERSION
Anesthesia: General | Wound class: Clean

## 2012-05-27 MED ORDER — SODIUM CHLORIDE 0.9 % IR SOLN
80.0000 mg | Status: DC
  Filled 2012-05-27: qty 2

## 2012-05-27 MED ORDER — WARFARIN SODIUM 4 MG PO TABS
4.0000 mg | ORAL_TABLET | Freq: Every day | ORAL | Status: DC
  Filled 2012-05-27: qty 1

## 2012-05-27 MED ORDER — DIGOXIN 250 MCG PO TABS: 0.2500 mg | ORAL_TABLET | Freq: Every day | ORAL | Status: DC

## 2012-05-27 MED ORDER — PROPOFOL 10 MG/ML IV BOLUS
INTRAVENOUS | Status: DC | PRN
  Administered 2012-05-27: 40 mg via INTRAVENOUS
  Administered 2012-05-27: 200 mg via INTRAVENOUS

## 2012-05-27 MED ORDER — SODIUM CHLORIDE 0.9 % IV SOLN: 250.0000 mL | INTRAVENOUS | Status: DC

## 2012-05-27 MED ORDER — DIAZEPAM 5 MG PO TABS
5.0000 mg | ORAL_TABLET | ORAL | Status: AC
  Administered 2012-05-28: 5 mg via ORAL
  Filled 2012-05-27: qty 1

## 2012-05-27 MED ORDER — ACETAMINOPHEN 325 MG PO TABS
650.0000 mg | ORAL_TABLET | ORAL | Status: DC | PRN
  Administered 2012-05-28: 650 mg via ORAL
  Filled 2012-05-27: qty 2

## 2012-05-27 MED ORDER — SODIUM CHLORIDE 0.9 % IJ SOLN: 3.0000 mL | INTRAMUSCULAR | Status: DC | PRN

## 2012-05-27 MED ORDER — MAGNESIUM OXIDE 400 (241.3 MG) MG PO TABS
400.0000 mg | ORAL_TABLET | Freq: Two times a day (BID) | ORAL | Status: DC
  Administered 2012-05-27 – 2012-05-29 (×5): 400 mg via ORAL
  Filled 2012-05-27 (×6): qty 1

## 2012-05-27 MED ORDER — SODIUM CHLORIDE 0.9 % IV SOLN: INTRAVENOUS | Status: DC

## 2012-05-27 MED ORDER — ONDANSETRON HCL 4 MG/2ML IJ SOLN
INTRAMUSCULAR | Status: DC | PRN
  Administered 2012-05-27: 4 mg via INTRAVENOUS

## 2012-05-27 MED ORDER — DIGOXIN 250 MCG PO TABS
0.2500 mg | ORAL_TABLET | Freq: Every day | ORAL | Status: DC
  Administered 2012-05-28 – 2012-05-29 (×2): 0.25 mg via ORAL
  Filled 2012-05-27 (×2): qty 1

## 2012-05-27 MED ORDER — LIDOCAINE HCL (CARDIAC) 20 MG/ML IV SOLN
INTRAVENOUS | Status: DC | PRN
  Administered 2012-05-27: 60 mg via INTRAVENOUS

## 2012-05-27 MED ORDER — SODIUM CHLORIDE 0.45 % IV SOLN
INTRAVENOUS | Status: DC
  Administered 2012-05-28: 07:00:00 via INTRAVENOUS

## 2012-05-27 MED ORDER — SUCCINYLCHOLINE CHLORIDE 20 MG/ML IJ SOLN
INTRAMUSCULAR | Status: DC | PRN
  Administered 2012-05-27: 100 mg via INTRAVENOUS

## 2012-05-27 MED ORDER — SODIUM CHLORIDE 0.9 % IJ SOLN
3.0000 mL | Freq: Two times a day (BID) | INTRAMUSCULAR | Status: DC
  Administered 2012-05-28: 3 mL via INTRAVENOUS

## 2012-05-27 MED ORDER — METOPROLOL TARTRATE 50 MG PO TABS
50.0000 mg | ORAL_TABLET | Freq: Two times a day (BID) | ORAL | Status: DC
  Administered 2012-05-27 – 2012-05-29 (×5): 50 mg via ORAL
  Filled 2012-05-27 (×6): qty 1

## 2012-05-27 MED ORDER — DABIGATRAN ETEXILATE MESYLATE 150 MG PO CAPS
150.0000 mg | ORAL_CAPSULE | Freq: Two times a day (BID) | ORAL | Status: DC
  Administered 2012-05-28 – 2012-05-29 (×2): 150 mg via ORAL
  Filled 2012-05-27 (×4): qty 1

## 2012-05-27 MED ORDER — WARFARIN - PHARMACIST DOSING INPATIENT: Freq: Every day | Status: DC

## 2012-05-27 MED ORDER — SODIUM CHLORIDE 0.9 % IV SOLN
INTRAVENOUS | Status: DC
  Administered 2012-05-27: 10:00:00 via INTRAVENOUS

## 2012-05-27 MED ORDER — SODIUM CHLORIDE 0.9 % IV SOLN
INTRAVENOUS | Status: DC | PRN
  Administered 2012-05-27 (×2): via INTRAVENOUS

## 2012-05-27 MED ORDER — CHLORHEXIDINE GLUCONATE 4 % EX LIQD: 60.0000 mL | Freq: Once | CUTANEOUS | Status: AC

## 2012-05-27 MED ORDER — CHLORHEXIDINE GLUCONATE 4 % EX LIQD
60.0000 mL | Freq: Once | CUTANEOUS | Status: AC
  Administered 2012-05-28: 4 via TOPICAL
  Filled 2012-05-27: qty 60

## 2012-05-27 MED ORDER — DEXTROSE 5 % IV SOLN
3.0000 g | INTRAVENOUS | Status: DC
  Filled 2012-05-27: qty 3000

## 2012-05-27 NOTE — Progress Notes (Signed)
[facility]  Subjective: No dizziness, SOB.  +LEE   Objective: Vital signs in last 24 hours: Temp:  [97.5 F (36.4 C)-98.2 F (36.8 C)] 98.1 F (36.7 C) (02/12 0531) Pulse Rate:  [57-102] 66 (02/12 0531) Resp:  [17-18] 17 (02/12 0531) BP: (123-157)/(72-89) 154/75 mmHg (02/12 0531) SpO2:  [94 %-100 %] 94 % (02/12 0531) Weight:  [109.181 kg (240 lb 11.2 oz)-109.317 kg (241 lb)] 109.181 kg (240 lb 11.2 oz) (02/12 0531) Last BM Date: 05/26/12  Intake/Output from previous day: 02/11 0701 - 02/12 0700 In: 770 [P.O.:720; I.V.:50] Out: 1200 [Urine:1200] Intake/Output this shift:    Medications Current Facility-Administered Medications  Medication Dose Route Frequency Provider Last Rate Last Dose  . allopurinol (ZYLOPRIM) tablet 150 mg  150 mg Oral q morning - 10a Abelino Derrick, [place]      . digoxin (LANOXIN) tablet 0.125 mg  0.125 mg Oral Daily Eda Paschal Rod[place], [place]      . diltiazem (CARDIZEM) 100 mg in dextrose 5 % 100 mL infusion  5-15 mg/hr Intravenous Continuous Abelino Derrick, [place] 10 mL/hr at 05/27/12 0634 10 mg/hr at 05/27/12 0634  . docusate sodium (COLACE) capsule 100 mg  100 mg Oral BID Eda Paschal Epps, PA   100 mg at 05/26/12 2155  . furosemide (LASIX) tablet 40 mg  40 mg Oral BID Abelino Derrick, PA   40 mg at 05/26/12 1840  . insulin aspart (novoLOG) injection 0-15 Units  0-15 Units Subcutaneous TID [facility] Eda Paschal [place], [place]   11 Units at 05/26/12 1839  . insulin aspart (novoLOG) injection 0-5 Units  0-5 Units Subcutaneous QHS Eda Paschal [place], [place]   3 Units at 05/26/12 2156  . insulin aspart protamine-insulin aspart (NOVOLOG 70/30) injection 20 Units  20 Units Subcutaneous BID WC Abelino Derrick, PA      . polyethylene glycol (MIRALAX / GLYCOLAX) packet 17 g  17 g Oral Daily PRN Abelino Derrick, PA      . potassium chloride SA (K-DUR,KLOR-CON) CR tablet 20 mEq  20 mEq Oral BID Eda Paschal [place], PA   20 mEq at 05/26/12 2156    PE: General appearance: alert,  cooperative and no distress Lungs: clear to auscultation bilaterally and but BS mildly decreased.  No wheeze, rales, or rhonchi Heart: irregularly irregular rhythm and No MM Extremities: 1+ LEE Pulses: 2+ and symmetric 2+ right DP 1+ left DP Skin: Warm and dry Neurologic: Grossly normal  Lab Results:   Recent Labs  05/26/12 1551  WBC 9.3  HGB 12.1*  HCT 38.2*  PLT 207   BMET  Recent Labs  05/26/12 1551  NA 140  K 3.4*  CL 99  CO2 32  GLUCOSE 359*  BUN 19  CREATININE 1.40*  CALCIUM 9.2   PT/INR  Recent Labs  05/26/12 1551 05/27/12 0511  LABPROT 23.3* 24.2*  INR 2.18* 2.29*     Assessment/Plan  Principal Problem:   Atrial fibrillation with rapid ventricular response, history of PAF; RVR currently rates 110s-140s; has had pauses & reported Bradycardia to 30s Active Problems:   Hypertension   Prostate cancer   DM2 (diabetes mellitus, type 2)   CKD (chronic kidney disease) stage 3, GFR 30-59 ml/min   LA thrombus   Chronic Diastolic Dysfunction -- exacerbated by Afib RVR; EF ~45%   Constipation   Chronic anticoagulation   Normal coronary arteries at cath 2009   Obstructive sleep apnea, C-pap intol  Glucosuria  Hyperglycemia  Plan:  For TEE/DCCV this AM..  AFib with RVR 110's.    BP stable.   Potassium replaced.  INR therapeutic.   IV diltiazem.   LOS: 1 day    Vincent [name] 05/27/2012 9:04 AM

## 2012-05-27 NOTE — CV Procedure (Signed)
[facility]  TRANSESOPHAGEAL ECHOCARDIOGRAM (TEE) NOTE   INDICATIONS: Atrial flutter with RVR, history of LAA thrombus  PROCEDURE:   Informed consent was obtained prior to the procedure. The risks, benefits and alternatives for the procedure were discussed and the patient comprehended these risks.  Risks include, but are not limited to, cough, sore throat, vomiting, nausea, somnolence, esophageal and stomach trauma or perforation, bleeding, low blood pressure, aspiration, pneumonia, infection, trauma to the teeth and death.    Due to morbid obesity, OSA and a small airway, he was thought to be at high risk of airway compromise. I arranged for the procedure to be performed under general anesthesia in the OR.  After a procedural time-out, the patient was intubated and sedated by the anesthesia staff. Once the airway was secured, the transesophageal probe was inserted in the esophagus and stomach without difficulty and multiple views were obtained.  The patient was kept under observation until the patient left the procedure room.  The patient left the procedure room in stable condition.   Agitated microbubble saline contrast was not administered.  COMPLICATIONS:    There were no immediate complications.  Findings:  1. LEFT VENTRICLE: The left ventricular wall thickness is mildly hypertrophied.  The left ventricular cavity is normal in size. Wall motion is globally hypokinetic.  LVEF is 40-45%.  2. RIGHT VENTRICLE:  The right ventricle is normal in structure and function without any thrombus or masses.    3. LEFT ATRIUM:  The left atrium is dilated in size without any thrombus or masses.  There is spontaneous echo contrast ("smoke") in the left atrium consistent with a low flow state.  4. LEFT ATRIAL APPENDAGE:  The left atrial appendage is noted to have a soft thrombus which persists.  It can be seen prolapsing in and out of the appendage in multiple views.  The  appendage has single lobes. Pulse doppler indicates low flow in the appendage with flutter waves.  5. ATRIAL SEPTUM:  The atrial septum appears intact and is free of thrombus and/or masses. There is slightly thickening over the caudal interatrial septum.  There is no evidence for interatrial shunting by color doppler.  6. RIGHT ATRIUM:  The right atrium is normal in size and function without any thrombus or masses. There is spontaneous echo contrast ("smoke") noted in the right atrium as well. There is coarse trabeculation of the right atrial appendage, but no obvious thrombus.  7. MITRAL VALVE:  The mitral valve is normal in structure and function with trace regurgitation.  There were no vegetations or stenosis.  8. AORTIC VALVE:  The aortic valve is normal in structure and function with No regurgitation.  There were no vegetations or stenosis  9. TRICUSPID VALVE:  The tricuspid valve is normal in structure and function with trace regurgitation.  There were no vegetations or stenosis  10.  PULMONIC VALVE:  The tricuspid valve is normal in structure and function with No regurgitation.  There were no vegetations or stenosis.   11. AORTIC ARCH, ASCENDING AND DESCENDING AORTA:  The aorta was not adequately visualized.  IMPRESSION:   1. Persistent LAA thrombus. 2. Biatrial enlargement with spontaneous echo contrast, suggesting a "low-flow" state 3. Atrial flutter with rapid ventricular response 4. Therapeutic INR on warfarin. 5. Moderate global hypokinesis, LVEF 40-45%  RECOMMENDATIONS:    1. Given persistent LAA thrombus despite warfarin anticoagulation, I would recommend changing anticoagulation strategies to Pradaxa 150 mg BID when the INR is <2.0.  There is limited data with Pradaxa in the setting of atrial appendage thrombus and virtually no data with Xarelto or Eliquis. This strategy is thought to be at least equal to warfarin with regard to efficacy of stroke reduction and may provide  more time "in therapeutic" range to allow dissolution of the thrombus.  He is at high risk for stroke with conversion, therefore I would recommend a rate control strategy with B-blockers, CCB's and digoxin. We have to be careful given his history of tachy-brady syndrome.  A pacer is being contemplated, however, I will have to discuss timing with my partner.   Time Spent Directly with the Patient:  45 minutes   Chrystie Nose, MD, [facility] Attending Cardiologist [facility]  05/27/2012, 1:03 PM

## 2012-05-27 NOTE — H&P (Signed)
     [facility]          INTERVAL PROCEDURE H&P   History and Physical Interval Note:  05/27/2012 9:21 AM  Given Mr. Bradsher' history of OSA and difficult to manage airway, I discussed definitive airway management to aid in the TEE. This was used successfully in 12/2011.  He had LA thrombus at the time. If the thrombus has resolved, will purse cardioversion.  Plan still for pacemaker likely tomorrow. Additional risk may include post-cardioversion bradycardia. He is aware of the possible need for temporary transcutaneous and/or transvenous pacing.  [name] has presented today for their planned procedure. The various methods of treatment have been discussed with the patient and family. After consideration of risks, benefits and other options for treatment, the patient has consented to the procedure.  The patients' outpatient history has been reviewed, patient examined, and no change in status from most recent office note within the past 30 days. I have reviewed the patients' chart and labs and will proceed as planned. Questions were answered to the patient's satisfaction.   Chrystie Nose, MD, [facility] Attending Cardiologist [facility]  Melessia Kaus C 05/27/2012, 9:21 AM

## 2012-05-27 NOTE — Transfer of Care (Signed)
Immediate Anesthesia Transfer of Care Note  Patient: Vincent [name]  Procedure(s) Performed: Procedure(s): CARDIOVERSION (N/A)  Patient Location: PACU  Anesthesia Type:General  Level of Consciousness: awake, alert , oriented and patient cooperative  Airway & Oxygen Therapy: Patient Spontanous Breathing and Patient connected to face mask oxygen  Post-op Assessment: Report given to PACU RN, Post -op Vital signs reviewed and stable and Patient moving all extremities  Post vital signs: Reviewed and stable  Complications: No apparent anesthesia complications

## 2012-05-27 NOTE — Anesthesia Procedure Notes (Signed)
Procedure Name: Intubation Date/Time: 05/27/2012 12:32 PM Performed by: Jerilee Hoh Pre-anesthesia Checklist: Patient identified, Emergency Drugs available, Suction available and Patient being monitored Patient Re-evaluated:Patient Re-evaluated prior to inductionOxygen Delivery Method: Circle system utilized Preoxygenation: Pre-oxygenation with 100% oxygen Intubation Type: IV induction Ventilation: Mask ventilation without difficulty Laryngoscope Size: Mac and 4 Grade View: Grade II Tube type: Oral Tube size: 7.5 mm Number of attempts: 1 Airway Equipment and Method: Stylet Placement Confirmation: ETT inserted through vocal cords under direct vision,  positive ETCO2 and breath sounds checked- equal and bilateral Secured at: 22 cm Tube secured with: Tape Dental Injury: Teeth and Oropharynx as per pre-operative assessment

## 2012-05-27 NOTE — Preoperative (Signed)
Beta Blockers   Reason not to administer Beta Blockers:Not Applicable 

## 2012-05-27 NOTE — Anesthesia Preprocedure Evaluation (Addendum)
Anesthesia Evaluation  Patient identified by MRN, date of birth, ID band Patient awake    Reviewed: Allergy & Precautions, H&P , NPO status , Patient's Chart, lab work & pertinent test results  History of Anesthesia Complications Negative for: history of anesthetic complications  Airway Mallampati: II TM Distance: >3 FB Neck ROM: Full    Dental  (+) Teeth Intact, Dental Advisory Given, Missing and Poor Dentition,    Pulmonary shortness of breath and with exertion, sleep apnea and Continuous Positive Airway Pressure Ventilation , former smoker,    Pulmonary exam normal       Cardiovascular hypertension, Pt. on medications and Pt. on home beta blockers +CHF negative cardio ROS  + dysrhythmias Atrial Fibrillation Rhythm:Irregular Rate:Normal     Neuro/Psych negative neurological ROS  negative psych ROS   GI/Hepatic negative GI ROS, Neg liver ROS, GERD-  Medicated,  Endo/Other  diabetes, Type 2, Insulin Dependent and Oral Hypoglycemic Agents  Renal/GU Renal InsufficiencyRenal disease     Musculoskeletal   Abdominal   Peds  Hematology negative hematology ROS (+)   Anesthesia Other Findings   Reproductive/Obstetrics                        Anesthesia Physical Anesthesia Plan  ASA: III  Anesthesia Plan: General   Post-op Pain Management:    Induction: Intravenous  Airway Management Planned: Oral ETT  Additional Equipment:   Intra-op Plan:   Post-operative Plan: Extubation in OR  Informed Consent: I have reviewed the patients History and Physical, chart, labs and discussed the procedure including the risks, benefits and alternatives for the proposed anesthesia with the patient or authorized representative who has indicated his/her understanding and acceptance.   Dental advisory given  Plan Discussed with: CRNA, Anesthesiologist and Surgeon  Anesthesia Plan Comments:        Anesthesia Quick Evaluation

## 2012-05-27 NOTE — Progress Notes (Signed)
Pt. Seen and examined. Agree with the NP/PA-C note as written. HR improved to the 100-110 range on IV diltiazem.  Appears in a-fib. Plan for TEE in the OR today under general anesthesia. If the LA thrombus has resolved, will pursue cardioversion. He has increased risk for post-procedural bradycardia which may require temporary pacing due to sick-sinus syndrome. He is aware of this. INR is 2.29 today which will support cardioversion. Plan to continue warfarin. Glucose is high, will need to adjust insulin regimen post-procedure.  K+ 3.4 yesterday, repleted last night.  Chrystie Nose, MD, [facility] Attending Cardiologist [facility]

## 2012-05-27 NOTE — Progress Notes (Signed)
ANTICOAGULATION CONSULT NOTE - Follow Up Consult  Pharmacy Consult for Coumadin Indication: A-flutter w/RVR, history of LA thrombus  Vital Signs: BP 154/75  Pulse 66  Temp(Src) 98.1 F (36.7 C) (Oral)  Resp 17  Ht 5\' 9"  (1.753 m)  Wt 240 lb 11.2 oz (109.181 kg)  BMI 35.53 kg/m2  SpO2 94%  Labs:  Recent Labs  05/26/12 1551 05/27/12 0511  HGB 12.1*  --   HCT 38.2*  --   PLT 207  --   LABPROT 23.3* 24.2*  INR 2.18* 2.29*  CREATININE 1.40*  --    Lab Results  Component Value Date   INR 2.29* 05/27/2012   INR 2.18* 05/26/2012   INR 2.28* 04/18/2012    Estimated Creatinine Clearance: 55.5 ml/min (by C-G formula based on Cr of 1.4).  Medications:  Medication Sig  . warfarin (COUMADIN) 4 MG tablet Take 4 mg by mouth daily.    Assessment:  Patient for TEE/DCCV today.  INR therapeutic 2.29.  No complications.  Goal of Therapy:  Target INR 2-3    Plan:  Continue home dose of Coumadin 4 mg daily.  Daily INR's, CBC.   Vincent [name], Deetta Perla.D 05/27/2012, 10:44 AM

## 2012-05-27 NOTE — Anesthesia Postprocedure Evaluation (Signed)
  Anesthesia Post-op Note  Patient: Vincent [name]  Procedure(s) Performed: Procedure(s): CARDIOVERSION (N/A)  Patient Location: PACU  Anesthesia Type:General  Level of Consciousness: awake  Airway and Oxygen Therapy: Patient Spontanous Breathing  Post-op Pain: mild  Post-op Assessment: Post-op Vital signs reviewed  Post-op Vital Signs: Reviewed  Complications: No apparent anesthesia complications

## 2012-05-27 NOTE — Progress Notes (Signed)
Attempted to obtain informed consent for permanent pacemaker placement, however patient still had questions and wasn't aware the procedure was going to be done on 05/28/12. Dr. Tresa Endo notified and said to hold off on the consent until morning rounding was done. Will continue to prep for possible surgery tomorrow. Will continue to monitor.

## 2012-05-28 ENCOUNTER — Ambulatory Visit (HOSPITAL_COMMUNITY): Admission: RE | Admit: 2012-05-28 | Payer: Medicare Other | Source: Ambulatory Visit | Admitting: Cardiovascular Disease

## 2012-05-28 ENCOUNTER — Encounter (HOSPITAL_COMMUNITY)
Admission: AD | Disposition: A | Discharge status: Home / Self Care | Payer: Self-pay | Source: Ambulatory Visit | Attending: Internal Medicine

## 2012-05-28 HISTORY — PX: PERMANENT PACEMAKER INSERTION: SHX5480

## 2012-05-28 LAB — BASIC METABOLIC PANEL
Chloride: 101 mEq/L (ref 96–112)
Creatinine, Ser: 1.31 mg/dL (ref 0.50–1.35)
GFR calc Af Amer: 60 mL/min — ABNORMAL LOW (ref >90)
Potassium: 4 mEq/L (ref 3.5–5.1)
Sodium: 138 mEq/L (ref 135–145)

## 2012-05-28 LAB — CBC
HCT: 38.4 % — ABNORMAL LOW (ref 39.0–52.0)
RBC: 4.08 MIL/uL — ABNORMAL LOW (ref 4.22–5.81)
RDW: 16.7 % — ABNORMAL HIGH (ref 11.5–15.5)
WBC: 11.8 10*3/uL — ABNORMAL HIGH (ref 4.0–10.5)

## 2012-05-28 LAB — GLUCOSE, CAPILLARY
Glucose-Capillary: 176 mg/dL — ABNORMAL HIGH (ref 70–99)
Glucose-Capillary: 295 mg/dL — ABNORMAL HIGH (ref 70–99)

## 2012-05-28 LAB — PROTIME-INR
INR: 1.96 — ABNORMAL HIGH (ref 0.00–1.49)
Prothrombin Time: 21.6 seconds — ABNORMAL HIGH (ref 11.6–15.2)

## 2012-05-28 SURGERY — PERMANENT PACEMAKER INSERTION
Anesthesia: LOCAL

## 2012-05-28 MED ORDER — LIDOCAINE HCL (PF) 1 % IJ SOLN
INTRAMUSCULAR | Status: AC
  Filled 2012-05-28: qty 60

## 2012-05-28 MED ORDER — CEFAZOLIN SODIUM 1-5 GM-% IV SOLN
INTRAVENOUS | Status: AC
  Administered 2012-05-28: 1 g via INTRAVENOUS
  Filled 2012-05-28: qty 100

## 2012-05-28 MED ORDER — HYDROCODONE-ACETAMINOPHEN 5-325 MG PO TABS: 1.0000 | ORAL_TABLET | ORAL | Status: DC | PRN

## 2012-05-28 MED ORDER — HEPARIN (PORCINE) IN NACL 2-0.9 UNIT/ML-% IJ SOLN
INTRAMUSCULAR | Status: AC
  Filled 2012-05-28: qty 500

## 2012-05-28 MED ORDER — CEFAZOLIN SODIUM 1-5 GM-% IV SOLN
1.0000 g | Freq: Four times a day (QID) | INTRAVENOUS | Status: AC
  Administered 2012-05-28 – 2012-05-29 (×3): 1 g via INTRAVENOUS
  Filled 2012-05-28 (×3): qty 50

## 2012-05-28 MED ORDER — SODIUM CHLORIDE 0.9 % IV SOLN
INTRAVENOUS | Status: DC
  Administered 2012-05-28 – 2012-05-29 (×2): via INTRAVENOUS

## 2012-05-28 MED ORDER — DILTIAZEM HCL ER COATED BEADS 240 MG PO CP24
240.0000 mg | ORAL_CAPSULE | Freq: Every day | ORAL | Status: DC
  Administered 2012-05-28 – 2012-05-29 (×2): 240 mg via ORAL
  Filled 2012-05-28 (×2): qty 1

## 2012-05-28 MED ORDER — ONDANSETRON HCL 4 MG/2ML IJ SOLN: 4.0000 mg | Freq: Four times a day (QID) | INTRAMUSCULAR | Status: DC | PRN

## 2012-05-28 MED ORDER — MIDAZOLAM HCL 2 MG/2ML IJ SOLN
INTRAMUSCULAR | Status: AC
  Filled 2012-05-28: qty 2

## 2012-05-28 MED ORDER — FENTANYL CITRATE 0.05 MG/ML IJ SOLN
INTRAMUSCULAR | Status: AC
  Filled 2012-05-28: qty 2

## 2012-05-28 NOTE — CV Procedure (Signed)
Vincent [name], Bartnick Male, 77 y.o., 1935/12/17  Location: [facility]  Bed: 3W12C-01  MRN: [id number]  CSN: [id number]  Procedure report  Procedure performed:  1. Implantation of new dual chamber permanent pacemaker 2. Fluoroscopy 3. 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 Bradycardia due to necessary medications Persistent atrial fibrillation  Procedure performed by: Thurmon Fair, MD  Complications: None  Estimated blood loss: <10 mL  Medications administered during procedure: Ancef 2 g intravenously Lidocaine 1% 30 mL locally,  Fentanyl 25 mcg intravenously Versed 1 mg intravenously  Device details:  [employer] Advantio model [id number] DR EL serial number [id number] Right atrial lead Guidant Dextrus 4136 serial number [id number] Right ventricular lead Guidant Dextrus 4137 serial number [id number]  Procedure details:  After the risks and benefits of the procedure were discussed the patient provided informed consent and was brought to the cardiac cath lab in the fasting state. The patient was prepped and draped in usual sterile fashion. Local anesthesia with 1% lidocaine was administered to to the left infraclavicular area. A 5-6 cm horizontal incision was made parallel with and 2-3 cm caudal to the left clavicle. Using electrocautery and blunt dissection a prepectoral pocket was created down to the level of the pectoralis major muscle fascia. The pocket was carefully inspected for hemostasis. An antibiotic-soaked sponge was placed in the pocket.  Under fluoroscopic guidance and using the modified Seldinger technique 2 separate venipunctures were performed to access the left subclavian vein. No difficulty was encountered accessing the vein.  Two J-tip guidewires were subsequently exchanged for two 7 French safe sheaths.  Under fluoroscopic guidance the ventricular lead was advanced to level of the mid  to apical right ventricular septum and thet active-fixation helix was deployed. Prominent current of injury was seen. Satisfactory pacing and sensing parameters were recorded. There was no evidence of diaphragmatic stimulation at maximum device output. The safe sheath was peeled away and the lead was secured in place with 2-0 silk.  In similar fashion the right atrial lead was advanced to the level of the atrial appendage. The active-fixation helix was deployed. There was prominent current of injury. Satisfactory sensing parameters were recorded, but threshold testing was prevented by atrial fbrillation. There was no evidence of diaphragmatic stimulation with pacing at maximum device output. The safe sheath was peeled away and the lead was secured in place with 2-0 silk.  The antibiotic-soaked sponge was removed from the pocket. The pocket was flushed with copious amounts of antibiotic solution. Reinspection showed excellent hemostasis..  The ventricular lead was connected to the generator and appropriate ventricular pacing was seen. Subsequently the atrial lead was also connected. Repeat testing of the lead parameters later showed excellent values.  The entire system was then carefully inserted in the pocket with care been taking that the leads and device assumed a comfortable position without pressure on the incision. Great care was taken that the leads be located deep to the generator. The pocket was then closed in layers using 2 layers of 2-0 Vicryl and cutaneous staples, after which a sterile dressing was applied.  At the end of the procedure the following lead parameters were encountered:  Right atrial lead sensed P waves 3 mV (atrial fibrillation), impedance 590 ohms. Right ventricular lead sensed R waves 19.2 mV, impedance 686 ohms, threshold 0.5 V at 0.4 ms pulse width.  Thurmon Fair, MD, [facility] [phone] office [phone]  pager 05/28/2012 12:42 PM

## 2012-05-28 NOTE — Progress Notes (Signed)
Subjective: Has questions about pacemaker  Objective: Vital signs in last 24 hours: Temp:  [97.2 F (36.2 C)-98.6 F (37 C)] 98.5 F (36.9 C) (02/13 0618) Pulse Rate:  [63-138] 97 (02/13 0618) Resp:  [18-27] 18 (02/13 0618) BP: (102-161)/(55-103) 140/69 mmHg (02/13 0618) SpO2:  [93 %-98 %] 96 % (02/13 0618) Weight:  [108.773 kg (239 lb 12.8 oz)] 108.773 kg (239 lb 12.8 oz) (02/13 0618) Weight change: -0.544 kg (-1 lb 3.2 oz) Last BM Date: 05/27/12 Intake/Output from previous day: -525 02/12 0701 - 02/13 0700 In: 500 [I.V.:500] Out: 1025 [Urine:1025] Intake/Output this shift:    PE: General:alert and oriented; crowded oropharynx Heart: irregular Lungs:clear WJX:BJYN, non-tender, normal; bowel sounds Ext:no edema Neuro:grossly nonfocal   Lab Results:  Recent Labs  05/26/12 1551 05/28/12 0450  WBC 9.3 11.8*  HGB 12.1* 12.6*  HCT 38.2* 38.4*  PLT 207 206   BMET  Recent Labs  05/26/12 1551 05/28/12 0450  NA 140 138  K 3.4* 4.0  CL 99 101  CO2 32 29  GLUCOSE 359* 147*  BUN 19 15  CREATININE 1.40* 1.31  CALCIUM 9.2 8.8   No results found for this basename: TROPONINI, CK, MB,  in the last 72 hours  Lab Results  Component Value Date   CHOL 126 12/19/2011   HDL 26* 12/19/2011   LDLCALC 70 12/19/2011   TRIG 148 12/19/2011   CHOLHDL 4.8 12/19/2011   Lab Results  Component Value Date   HGBA1C 9.1* 04/01/2012     Lab Results  Component Value Date   TSH 2.574 05/26/2012    Hepatic Function Panel  Recent Labs  05/26/12 1551  PROT 7.0  ALBUMIN 3.2*  AST 25  ALT 14  ALKPHOS 98  BILITOT 0.9   Studies/Results: No results found.  Medications: I have reviewed the patient's current medications. Marland Kitchen allopurinol  150 mg Oral q morning - 10a  .  ceFAZolin (ANCEF) IV  3 g Intravenous On Call  . dabigatran  150 mg Oral Q12H  . diazepam  5 mg Oral On Call  . digoxin  0.25 mg Oral Daily  . docusate sodium  100 mg Oral BID  . furosemide  40 mg Oral BID  .  gentamicin irrigation  80 mg Irrigation On Call  . insulin aspart  0-15 Units Subcutaneous TID WC  . insulin aspart  0-5 Units Subcutaneous QHS  . insulin aspart protamine-insulin aspart  20 Units Subcutaneous BID WC  . magnesium oxide  400 mg Oral BID  . metoprolol tartrate  50 mg Oral BID  . potassium chloride  20 mEq Oral BID  . sodium chloride  3 mL Intravenous Q12H   Assessment/Plan: Principal Problem:   Atrial fibrillation with rapid ventricular response, history of PAF; RVR currently rates 110s-140s; has had pauses & reported Bradycardia to 30s Active Problems:   Hypertension   Prostate cancer   DM2 (diabetes mellitus, type 2)   CKD (chronic kidney disease) stage 3, GFR 30-59 ml/min   LA thrombus   Chronic Diastolic Dysfunction -- exacerbated by Afib RVR; EF ~45%   Constipation   Chronic anticoagulation   Normal coronary arteries at cath 2009   Obstructive sleep apnea, C-pap intol   PLAN:unable to DCCV secondary to on going LA thrombus.  A. Fib rate in the 90's with history of HR down to 30 with medication to treat his a fib.    LOS: 2 days   INGOLD,LAURA R 05/28/2012, 9:39 AM   I  have seen and examined the patient along with [facility] R, NP.  I have reviewed the chart, notes and new data.  I agree with NP's note.  PLAN: Discussed purpose of, risks and benefits and possible complications of dual chamber pacemaker in detail. This procedure has been fully reviewed with the patient and written informed consent has been obtained.   Thurmon Fair, MD, [facility] [phone] 05/28/2012, 9:48 AM

## 2012-05-29 ENCOUNTER — Inpatient Hospital Stay (HOSPITAL_COMMUNITY): Payer: Medicare Other

## 2012-05-29 ENCOUNTER — Encounter (HOSPITAL_COMMUNITY): Payer: Self-pay | Admitting: Cardiology

## 2012-05-29 DIAGNOSIS — R001 Bradycardia, unspecified: Secondary | ICD-10-CM

## 2012-05-29 DIAGNOSIS — I495 Sick sinus syndrome: Secondary | ICD-10-CM

## 2012-05-29 HISTORY — DX: Bradycardia, unspecified: R00.1

## 2012-05-29 HISTORY — DX: Sick sinus syndrome: I49.5

## 2012-05-29 LAB — CBC
MCH: 30.6 pg (ref 26.0–34.0)
MCV: 94.6 fL (ref 78.0–100.0)
Platelets: 214 10*3/uL (ref 150–400)
RDW: 16.6 % — ABNORMAL HIGH (ref 11.5–15.5)
WBC: 11.9 10*3/uL — ABNORMAL HIGH (ref 4.0–10.5)

## 2012-05-29 LAB — BASIC METABOLIC PANEL
CO2: 27 mEq/L (ref 19–32)
Calcium: 8.7 mg/dL (ref 8.4–10.5)
Creatinine, Ser: 1.42 mg/dL — ABNORMAL HIGH (ref 0.50–1.35)
GFR calc non Af Amer: 47 mL/min — ABNORMAL LOW (ref >90)
Sodium: 137 mEq/L (ref 135–145)

## 2012-05-29 LAB — GLUCOSE, CAPILLARY: Glucose-Capillary: 241 mg/dL — ABNORMAL HIGH (ref 70–99)

## 2012-05-29 LAB — PROTIME-INR
INR: 2.15 — ABNORMAL HIGH (ref 0.00–1.49)
Prothrombin Time: 23.1 seconds — ABNORMAL HIGH (ref 11.6–15.2)

## 2012-05-29 MED ORDER — HYDROCODONE-ACETAMINOPHEN 5-325 MG PO TABS: 1.0000 | ORAL_TABLET | ORAL | Status: DC | PRN

## 2012-05-29 MED ORDER — MAGNESIUM OXIDE 400 (241.3 MG) MG PO TABS: 400.0000 mg | ORAL_TABLET | Freq: Two times a day (BID) | ORAL | Status: DC

## 2012-05-29 MED ORDER — DILTIAZEM HCL ER COATED BEADS 240 MG PO CP24: 240.0000 mg | ORAL_CAPSULE | Freq: Every day | ORAL | Status: DC

## 2012-05-29 MED ORDER — METOPROLOL TARTRATE 100 MG PO TABS: 50.0000 mg | ORAL_TABLET | Freq: Two times a day (BID) | ORAL | Status: DC

## 2012-05-29 MED ORDER — DOCUSATE SODIUM 100 MG PO CAPS: 100.0000 mg | ORAL_CAPSULE | Freq: Two times a day (BID) | ORAL | Status: AC | PRN

## 2012-05-29 NOTE — Progress Notes (Signed)
[facility]  DAILY PROGRESS NOTE   Subjective:  A little sore at surgical site, no other problems  Objective:  Temp:  [97.3 F (36.3 C)-98.7 F (37.1 C)] 98.7 F (37.1 C) (02/14 0524) Pulse Rate:  [68-90] 86 (02/14 0524) Resp:  [18] 18 (02/14 0524) BP: (110-142)/(55-82) 142/82 mmHg (02/14 0524) SpO2:  [100 %] 100 % (02/14 0524) Weight:  [112.492 kg (248 lb)] 112.492 kg (248 lb) (02/14 0524) Weight change: 3.719 kg (8 lb 3.2 oz)  Intake/Output from previous day: 02/13 0701 - 02/14 0700 In: -  Out: 100 [Urine:100]  Intake/Output from this shift:    Medications: Current Facility-Administered Medications  Medication Dose Route Frequency Provider Last Rate Last Dose  . 0.9 %  sodium chloride infusion   Intravenous Continuous Chrystie Nose, MD 50 mL/hr at 05/27/12 1006    . 0.9 %  sodium chloride infusion   Intravenous Continuous Teryl Mcconaghy, MD 50 mL/hr at 05/28/12 1346    . acetaminophen (TYLENOL) tablet 650 mg  650 mg Oral Q4H PRN Abelino Derrick, PA   650 mg at 05/28/12 1610  . allopurinol (ZYLOPRIM) tablet 150 mg  150 mg Oral q morning - 10a Abelino Derrick, PA   150 mg at 05/28/12 1014  . dabigatran (PRADAXA) capsule 150 mg  150 mg Oral Q12H Chrystie Nose, MD   150 mg at 05/28/12 2146  . digoxin (LANOXIN) tablet 0.25 mg  0.25 mg Oral Daily Chrystie Nose, MD   0.25 mg at 05/28/12 1014  . diltiazem (CARDIZEM CD) 24 hr capsule 240 mg  240 mg Oral Daily Shan Padgett, MD   240 mg at 05/28/12 1345  . docusate sodium (COLACE) capsule 100 mg  100 mg Oral BID Eda Paschal [place], PA   100 mg at 05/28/12 2146  . furosemide (LASIX) tablet 40 mg  40 mg Oral BID Abelino Derrick, PA   40 mg at 05/29/12 0846  . HYDROcodone-acetaminophen (NORCO/VICODIN) 5-325 MG per tablet 1-2 tablet  1-2 tablet Oral Q4H PRN Holley Kocurek, MD      . insulin aspart (novoLOG) injection 0-15 Units  0-15 Units Subcutaneous TID WC Eda Paschal [place], PA   5 Units at 05/29/12 0848  .  insulin aspart (novoLOG) injection 0-5 Units  0-5 Units Subcutaneous QHS Eda Paschal [place], [place]   3 Units at 05/28/12 2145  . insulin aspart protamine-insulin aspart (NOVOLOG 70/30) injection 20 Units  20 Units Subcutaneous BID WC Eda Paschal [place], [place]   20 Units at 05/29/12 0849  . magnesium oxide (MAG-OX) tablet 400 mg  400 mg Oral BID Eda Paschal [place], PA   400 mg at 05/28/12 2148  . metoprolol (LOPRESSOR) tablet 50 mg  50 mg Oral BID Chrystie Nose, MD   50 mg at 05/28/12 2146  . ondansetron (ZOFRAN) injection 4 mg  4 mg Intravenous Q6H PRN Emmalena Canny, MD      . polyethylene glycol (MIRALAX / GLYCOLAX) packet 17 g  17 g Oral Daily PRN Abelino Derrick, PA      . potassium chloride SA (K-DUR,KLOR-CON) CR tablet 20 mEq  20 mEq Oral BID Abelino Derrick, PA   20 mEq at 05/28/12 2146    Physical Exam: General appearance: alert, cooperative and no distress Neck: no adenopathy, no carotid bruit, no JVD, supple, symmetrical, trachea midline and thyroid not enlarged, symmetric, no tenderness/mass/nodules Lungs: clear to auscultation bilaterally Heart: irregularly irregular rhythm Abdomen: soft, non-tender;  bowel sounds normal; no masses,  no organomegaly Extremities: extremities normal, atraumatic, no cyanosis or edema Pulses: 2+ and symmetric Skin: Skin color, texture, turgor normal. No rashes or lesions or healthy surgical site Neurologic: Grossly normal  Lab Results: Results for orders placed during the hospital encounter of 05/26/12 (from the past 48 hour(s))  GLUCOSE, CAPILLARY     Status: Abnormal   Collection Time    05/27/12  1:04 PM      Result Value Range   Glucose-Capillary 220 (*) 70 - 99 mg/dL   Comment 1 Notify RN    GLUCOSE, CAPILLARY     Status: Abnormal   Collection Time    05/27/12  2:20 PM      Result Value Range   Glucose-Capillary 213 (*) 70 - 99 mg/dL  GLUCOSE, CAPILLARY     Status: Abnormal   Collection Time    05/27/12  4:37 PM      Result Value Range   Glucose-Capillary  240 (*) 70 - 99 mg/dL  GLUCOSE, CAPILLARY     Status: Abnormal   Collection Time    05/27/12  7:49 PM      Result Value Range   Glucose-Capillary 203 (*) 70 - 99 mg/dL   Comment 1 Notify RN    PROTIME-INR     Status: Abnormal   Collection Time    05/28/12  4:50 AM      Result Value Range   Prothrombin Time 21.6 (*) 11.6 - 15.2 seconds   INR 1.96 (*) 0.00 - 1.49  BASIC METABOLIC PANEL     Status: Abnormal   Collection Time    05/28/12  4:50 AM      Result Value Range   Sodium 138  135 - 145 mEq/L   Potassium 4.0  3.5 - 5.1 mEq/L   Chloride 101  96 - 112 mEq/L   CO2 29  19 - 32 mEq/L   Glucose, Bld 147 (*) 70 - 99 mg/dL   BUN 15  6 - 23 mg/dL   Creatinine, Ser 4.09  0.50 - 1.35 mg/dL   Calcium 8.8  8.4 - 81.1 mg/dL   GFR calc non Af Amer 52 (*) >90 mL/min   GFR calc Af Amer 60 (*) >90 mL/min   Comment:            The eGFR has been calculated     using the CKD EPI equation.     This calculation has not been     validated in all clinical     situations.     eGFR's persistently     <90 mL/min signify     possible Chronic Kidney Disease.  CBC     Status: Abnormal   Collection Time    05/28/12  4:50 AM      Result Value Range   WBC 11.8 (*) 4.0 - 10.5 K/uL   RBC 4.08 (*) 4.22 - 5.81 MIL/uL   Hemoglobin 12.6 (*) 13.0 - 17.0 g/dL   HCT 91.4 (*) 78.2 - 95.6 %   MCV 94.1  78.0 - 100.0 fL   MCH 30.9  26.0 - 34.0 pg   MCHC 32.8  30.0 - 36.0 g/dL   RDW 21.3 (*) 08.6 - 57.8 %   Platelets 206  150 - 400 K/uL  GLUCOSE, CAPILLARY     Status: Abnormal   Collection Time    05/28/12  7:36 AM      Result Value Range   Glucose-Capillary 189 (*)  70 - 99 mg/dL  GLUCOSE, CAPILLARY     Status: Abnormal   Collection Time    05/28/12  1:22 PM      Result Value Range   Glucose-Capillary 176 (*) 70 - 99 mg/dL  GLUCOSE, CAPILLARY     Status: Abnormal   Collection Time    05/28/12  4:27 PM      Result Value Range   Glucose-Capillary 295 (*) 70 - 99 mg/dL  GLUCOSE, CAPILLARY     Status:  Abnormal   Collection Time    05/28/12  9:01 PM      Result Value Range   Glucose-Capillary 215 (*) 70 - 99 mg/dL  PROTIME-INR     Status: Abnormal   Collection Time    05/29/12  5:46 AM      Result Value Range   Prothrombin Time 23.1 (*) 11.6 - 15.2 seconds   INR 2.15 (*) 0.00 - 1.49  BASIC METABOLIC PANEL     Status: Abnormal   Collection Time    05/29/12  5:46 AM      Result Value Range   Sodium 137  135 - 145 mEq/L   Potassium 4.0  3.5 - 5.1 mEq/L   Chloride 100  96 - 112 mEq/L   CO2 27  19 - 32 mEq/L   Glucose, Bld 220 (*) 70 - 99 mg/dL   BUN 19  6 - 23 mg/dL   Creatinine, Ser 1.61 (*) 0.50 - 1.35 mg/dL   Calcium 8.7  8.4 - 09.6 mg/dL   GFR calc non Af Amer 47 (*) >90 mL/min   GFR calc Af Amer 54 (*) >90 mL/min   Comment:            The eGFR has been calculated     using the CKD EPI equation.     This calculation has not been     validated in all clinical     situations.     eGFR's persistently     <90 mL/min signify     possible Chronic Kidney Disease.  CBC     Status: Abnormal   Collection Time    05/29/12  5:46 AM      Result Value Range   WBC 11.9 (*) 4.0 - 10.5 K/uL   RBC 4.09 (*) 4.22 - 5.81 MIL/uL   Hemoglobin 12.5 (*) 13.0 - 17.0 g/dL   HCT 04.5 (*) 40.9 - 81.1 %   MCV 94.6  78.0 - 100.0 fL   MCH 30.6  26.0 - 34.0 pg   MCHC 32.3  30.0 - 36.0 g/dL   RDW 91.4 (*) 78.2 - 95.6 %   Platelets 214  150 - 400 K/uL  GLUCOSE, CAPILLARY     Status: Abnormal   Collection Time    05/29/12  8:15 AM      Result Value Range   Glucose-Capillary 241 (*) 70 - 99 mg/dL   Comment 1 Documented in Chart     Comment 2 Notify RN      Imaging: No results found. CXR pending  This AM device check shows favorable parameters: RV lead 19.3 mV, imp 646 ohm, 0.4V@0 .4ms RA lead 3.4 mV, 559 ohm (atrial fibrillation)  Assessment:  1. Principal Problem: 2.   Atrial fibrillation with rapid ventricular response, history of PAF; RVR currently rates 110s-140s; has had pauses &  reported Bradycardia to 30s 3. Active Problems: 4.   Hypertension 5.   Prostate cancer 6.   DM2 (diabetes mellitus,  type 2) 7.   CKD (chronic kidney disease) stage 3, GFR 30-59 ml/min 8.   LA thrombus 9.   Chronic Diastolic Dysfunction -- exacerbated by Afib RVR; EF ~45% 10.   Constipation 11.   Chronic anticoagulation 12.   Normal coronary arteries at cath 2009 13.   Obstructive sleep apnea, C-pap intol 14.   Plan:  1. If CXR ok , DC home later today (his wife is in hemodialysis until noon today, son-in-law works here at [facility]) 2. As INR is still >2, will plan to "switch" to novel anticoagulant as an outpatient - when he comes in for wound check in 7-10 days. 3. Good rate control on current beta blocker + diltiazem + dig combination 4. Consider repeat TEE-CV after 4-6 weeks of alternative anticoagulation.  Time Spent Directly with Patient:  45 minutes  Length of Stay:  LOS: 3 days    Doriann Zuch 05/29/2012, 9:01 AM

## 2012-05-29 NOTE — Discharge Summary (Signed)
Physician Discharge Summary  Patient ID: EDWARDS MCKELVIE MRN: [id number] DOB/AGE: [birth year] 75 y.o.  Admit date: 05/26/2012 Discharge date: 05/29/2012  Discharge Diagnoses:  Principal Problem:   Atrial fibrillation with rapid ventricular response, history of PAF; RVR currently rates 110s-140s; has had pauses & reported Bradycardia to 30s rate controlled at discharge Active Problems:   LA thrombus   Tachycardia-bradycardia syndrome   S/P cardiac pacemaker procedure, New, [employer] 05/28/12   Hypertension   CKD (chronic kidney disease) stage 3, GFR 30-59 ml/min   Chronic anticoagulation   Symptomatic bradycardia   Prostate cancer   DM2 (diabetes mellitus, type 2)   Chronic Diastolic Dysfunction -- exacerbated by Afib RVR; EF ~45%   Constipation   Normal coronary arteries at cath 2009   Obstructive sleep apnea, C-pap intol   Discharged Condition: good  Procedures: 05/27/12 TEE:  By Dr. Rennis Golden  05/28/12 by Dr. Cari Caraway [employer] Advantio model [id number] DR EL serial number [id number]  Right atrial lead [employer] Dextrus 4136 serial number [id number]  Right ventricular lead [employer] Dext[employer] 4137 serial number [id number]   Hospital Course: 77 y/o with a history of PAF with SSS on Coumadin with a history of LAA thrombus. He was admitted earlier in Jan 2014 with PAF. He was seen by Dr Royann Shivers then. At that time he was not felt to be a candidate for a pacemaker. He was seen 05/25/12 after he was noted to have rapid AF on Holter. Lanoxin was added and he is seen now for follow up. He remains in rapid AF with VR of 150. He is tolerating this well. Pt. admitted for rate control and TEE/CV,  done in the OR secondary to severe sleep apnea.  EF 45% with global hypokinesis with TEE.   On admit he was started on IV dilt with slowing of the rate to 80's.  He did well and underwent TEE as planned, but Lt. Atrial appendage continued with Thrombus.  No DCCV was done.   Due to his previous  symptomatic bradycardia with higher dose of meds for rapid a. Fib and his tachy brady syndrome, it was determined that pacemaker would be most beneficial at this time.  Pt agreed and underwent implantation of [employer] PPM by Dr. Royann Shivers.    By the next AM he was stable, CXR without pneumothorax and threshold stable.  He walked in the hall, seen by Dr. Royann Shivers and was felt stable and ready for discharge.   Please note his INR went up somewhat with pradaxa and due to new pacemaker, Dr. Royann Shivers did not wish to give anticoagulation until he is seen in the office.  We will resume Pradaxa at outpt visit in 1 week.    Need to check Dig. Level with outpatient labs in office.  Consults: none  Significant Diagnostic Studies:  BMET    Component Value Date/Time   NA 137 05/29/2012 0546   K 4.0 05/29/2012 0546   CL 100 05/29/2012 0546   CO2 27 05/29/2012 0546   GLUCOSE 220* 05/29/2012 0546   BUN 19 05/29/2012 0546   CREATININE 1.42* 05/29/2012 0546   CALCIUM 8.7 05/29/2012 0546   GFRNONAA 47* 05/29/2012 0546   GFRAA 54* 05/29/2012 0546    CBC    Component Value Date/Time   WBC 11.9* 05/29/2012 0546   RBC 4.09* 05/29/2012 0546   HGB 12.5* 05/29/2012 0546   HCT 38.7* 05/29/2012 0546   PLT 214 05/29/2012 0546   MCV 94.6 05/29/2012 0546  MCH 30.6 05/29/2012 0546   MCHC 32.3 05/29/2012 0546   RDW 16.6* 05/29/2012 0546   LYMPHSABS 2.1 04/13/2012 1746   MONOABS 1.7* 04/13/2012 1746   EOSABS 0.2 04/13/2012 1746   BASOSABS 0.0 04/13/2012 1746   TSH 2.57   2 View CXR 05/29/12: CHEST - 2 VIEW  Comparison: 04/13/2012  Findings: Left subclavian dual lead pacemaker device and leads are  stable. Lungs are under aerated with linear atelectasis at the  left base. Tips of the leads are in the right atrium and right  ventricle. No pneumothorax.  IMPRESSION:  Dual lead left subclavian pacemaker placement as described without  pneumothorax   TEE 05/27/12 by Dr. Rennis Golden: 1. LEFT VENTRICLE: The left  ventricular wall thickness is mildly hypertrophied. The left ventricular cavity is normal in size. Wall motion is globally hypokinetic. LVEF is 40-45%. 2. RIGHT VENTRICLE: The right ventricle is normal in structure and function without any thrombus or masses.  3. LEFT ATRIUM: The left atrium is dilated in size without any thrombus or masses. There is spontaneous echo contrast ("smoke") in the left atrium consistent with a low flow state. 4. LEFT ATRIAL APPENDAGE: The left atrial appendage is noted to have a soft thrombus which persists. It can be seen prolapsing in and out of the appendage in multiple views. The appendage has single lobes. Pulse doppler indicates low flow in the appendage with flutter waves. 5. ATRIAL SEPTUM: The atrial septum appears intact and is free of thrombus and/or masses. There is slightly thickening over the caudal interatrial septum. There is no evidence for interatrial shunting by color doppler. 6. RIGHT ATRIUM: The right atrium is normal in size and function without any thrombus or masses. There is spontaneous echo contrast ("smoke") noted in the right atrium as well. There is coarse trabeculation of the right atrial appendage, but no obvious thrombus. 7. MITRAL VALVE: The mitral valve is normal in structure and function with trace regurgitation. There were no vegetations or stenosis. 8. AORTIC VALVE: The aortic valve is normal in structure and function with No regurgitation. There were no vegetations or stenosis 9. TRICUSPID VALVE: The tricuspid valve is normal in structure and function with trace regurgitation. There were no vegetations or stenosis 10. PULMONIC VALVE: The tricuspid valve is normal in structure and function with No regurgitation. There were no vegetations or stenosis. 11. AORTIC ARCH, ASCENDING AND DESCENDING AORTA: The aorta was not adequately visualized. IMPRESSION:  1. Persistent LAA thrombus. 2. Biatrial enlargement with spontaneous echo contrast,  suggesting a "low-flow" state 3. Atrial flutter with rapid ventricular response 4. Therapeutic INR on warfarin. 5. Moderate global hypokinesis, LVEF 40-45%     Discharge Exam: Blood pressure 136/64, pulse 81, temperature 98.7 F (37.1 C), temperature source Oral, resp. rate 18, height 5\' 9"  (1.753 m), weight 112.492 kg (248 lb), SpO2 100.00%.  AM Exam:   Physical Exam:  General appearance: alert, cooperative and no distress  Neck: no adenopathy, no carotid bruit, no JVD, supple, symmetrical, trachea midline and thyroid not enlarged, symmetric, no tenderness/mass/nodules  Lungs: clear to auscultation bilaterally  Heart: irregularly irregular rhythm  Abdomen: soft, non-tender; bowel sounds normal; no masses, no organomegaly  Extremities: extremities normal, atraumatic, no cyanosis or edema  Pulses: 2+ and symmetric  Skin: Skin color, texture, turgor normal. No rashes or lesions or healthy surgical site  Neurologic: Grossly normal     Disposition: 01-Home or Self Care     Medication List    STOP taking these medications  ibuprofen 200 MG tablet  Commonly known as:  ADVIL,MOTRIN     warfarin 4 MG tablet  Commonly known as:  COUMADIN      TAKE these medications       allopurinol 100 MG tablet  Commonly known as:  ZYLOPRIM  Take 1.5 tablets (150 mg total) by mouth every morning.     DIGOX 0.25 MG tablet  Generic drug:  digoxin  Take 0.25 mg by mouth daily.     diltiazem 240 MG 24 hr capsule  Commonly known as:  CARDIZEM CD  Take 1 capsule (240 mg total) by mouth daily.     docusate sodium 100 MG capsule  Commonly known as:  COLACE  Take 1 capsule (100 mg total) by mouth 2 (two) times daily as needed for constipation.     furosemide 40 MG tablet  Commonly known as:  LASIX  Take 1 tablet (40 mg total) by mouth 2 (two) times daily.     HYDROcodone-acetaminophen 5-325 MG per tablet  Commonly known as:  NORCO/VICODIN  Take 1-2 tablets by mouth every 4 (four)  hours as needed.     insulin aspart protamine-insulin aspart (70-30) 100 UNIT/ML injection  Commonly known as:  NOVOLOG 70/30  Inject 20 Units into the skin 2 (two) times daily with a meal.     magnesium oxide 400 (241.3 MG) MG tablet  Commonly known as:  MAG-OX  Take 1 tablet (400 mg total) by mouth 2 (two) times daily.     metoprolol 100 MG tablet  Commonly known as:  LOPRESSOR  Take 0.5 tablets (50 mg total) by mouth 2 (two) times daily.     polyethylene glycol packet  Commonly known as:  MIRALAX / GLYCOLAX  Take 17 g by mouth daily as needed. For constipation     potassium chloride 10 MEQ tablet  Commonly known as:  K-DUR,KLOR-CON  Take 2 tablets (20 mEq total) by mouth daily.           Follow-up Information   Follow up with Thurmon Fair, MD. (our office will call you Monday for follow up instructions. )    Contact information:   [address] [place] [address] [phone]       Follow up with [facility]. (Our office will call Monday to arrange.)    Contact information:   [facility]    Discharge Instructions: Heart Healthy Diabetic diet Weigh daily, call if wt. Increases by 3 pounds in a day or 5 pounds in a week.    Supplemental Discharge Instructions for  Pacemaker Patients   Signed: Briel Gallicchio R 05/29/2012, 1:58 PM

## 2012-05-29 NOTE — Progress Notes (Signed)
Spoke to [name], NP she stated to tell patient no coumadin until he goes to  follow up.  Also stated to instruct patient to remove Tegaderm tomorrow, and then can shower and pat area dry.  I added this to the discharge paperwork.

## 2012-06-01 ENCOUNTER — Encounter (HOSPITAL_COMMUNITY): Payer: Self-pay | Admitting: Internal Medicine

## 2012-06-02 ENCOUNTER — Telehealth: Payer: Self-pay | Admitting: Physical Medicine & Rehabilitation

## 2012-06-02 NOTE — Telephone Encounter (Signed)
Has pacemaker, does he need home health PT?  Left message for Enrique Sack to clarify request.

## 2012-06-03 ENCOUNTER — Telehealth: Payer: Self-pay

## 2012-06-03 NOTE — Telephone Encounter (Signed)
Enrique Sack called back saying the wanted permission to continue therapy but they got the orders from another provider.

## 2012-06-03 NOTE — Telephone Encounter (Signed)
Vincent [name] called to get verbal order to continue home health care.  Verbal given.

## 2012-06-15 ENCOUNTER — Telehealth: Payer: Self-pay

## 2012-06-15 NOTE — Telephone Encounter (Signed)
[name] with [facility] called to get continuation of care orders from patient.  She was advised to contact patients cardiologist.

## 2012-06-26 ENCOUNTER — Encounter (HOSPITAL_COMMUNITY): Payer: Self-pay | Admitting: Emergency Medicine

## 2012-06-26 ENCOUNTER — Emergency Department (HOSPITAL_COMMUNITY): Payer: Medicare Other

## 2012-06-26 ENCOUNTER — Emergency Department (HOSPITAL_COMMUNITY)
Admission: EM | Admit: 2012-06-26 | Discharge: 2012-06-26 | Disposition: A | Discharge status: Home / Self Care | Payer: Medicare Other | Attending: Emergency Medicine | Admitting: Emergency Medicine

## 2012-06-26 DIAGNOSIS — S0990XA Unspecified injury of head, initial encounter: Secondary | ICD-10-CM | POA: Insufficient documentation

## 2012-06-26 DIAGNOSIS — Y929 Unspecified place or not applicable: Secondary | ICD-10-CM | POA: Insufficient documentation

## 2012-06-26 DIAGNOSIS — Z8546 Personal history of malignant neoplasm of prostate: Secondary | ICD-10-CM | POA: Insufficient documentation

## 2012-06-26 DIAGNOSIS — Z8739 Personal history of other diseases of the musculoskeletal system and connective tissue: Secondary | ICD-10-CM | POA: Insufficient documentation

## 2012-06-26 DIAGNOSIS — I509 Heart failure, unspecified: Secondary | ICD-10-CM | POA: Insufficient documentation

## 2012-06-26 DIAGNOSIS — W19XXXA Unspecified fall, initial encounter: Secondary | ICD-10-CM

## 2012-06-26 DIAGNOSIS — IMO0002 Reserved for concepts with insufficient information to code with codable children: Secondary | ICD-10-CM | POA: Insufficient documentation

## 2012-06-26 DIAGNOSIS — Z8719 Personal history of other diseases of the digestive system: Secondary | ICD-10-CM | POA: Insufficient documentation

## 2012-06-26 DIAGNOSIS — Z8679 Personal history of other diseases of the circulatory system: Secondary | ICD-10-CM | POA: Insufficient documentation

## 2012-06-26 DIAGNOSIS — Z8701 Personal history of pneumonia (recurrent): Secondary | ICD-10-CM | POA: Insufficient documentation

## 2012-06-26 DIAGNOSIS — N183 Chronic kidney disease, stage 3 unspecified: Secondary | ICD-10-CM | POA: Insufficient documentation

## 2012-06-26 DIAGNOSIS — W06XXXA Fall from bed, initial encounter: Secondary | ICD-10-CM | POA: Insufficient documentation

## 2012-06-26 DIAGNOSIS — E1169 Type 2 diabetes mellitus with other specified complication: Secondary | ICD-10-CM | POA: Insufficient documentation

## 2012-06-26 DIAGNOSIS — R739 Hyperglycemia, unspecified: Secondary | ICD-10-CM

## 2012-06-26 DIAGNOSIS — Z87891 Personal history of nicotine dependence: Secondary | ICD-10-CM | POA: Insufficient documentation

## 2012-06-26 DIAGNOSIS — Y939 Activity, unspecified: Secondary | ICD-10-CM | POA: Insufficient documentation

## 2012-06-26 DIAGNOSIS — Z950 Presence of cardiac pacemaker: Secondary | ICD-10-CM | POA: Insufficient documentation

## 2012-06-26 DIAGNOSIS — I129 Hypertensive chronic kidney disease with stage 1 through stage 4 chronic kidney disease, or unspecified chronic kidney disease: Secondary | ICD-10-CM | POA: Insufficient documentation

## 2012-06-26 DIAGNOSIS — Z79899 Other long term (current) drug therapy: Secondary | ICD-10-CM | POA: Insufficient documentation

## 2012-06-26 LAB — POCT I-STAT, CHEM 8
Calcium, Ion: 1.03 mmol/L — ABNORMAL LOW (ref 1.13–1.30)
Chloride: 98 mEq/L (ref 96–112)
Glucose, Bld: 422 mg/dL — ABNORMAL HIGH (ref 70–99)
HCT: 50 % (ref 39.0–52.0)
Hemoglobin: 17 g/dL (ref 13.0–17.0)
TCO2: 30 mmol/L (ref 0–100)

## 2012-06-26 MED ORDER — INSULIN ASPART 100 UNIT/ML ~~LOC~~ SOLN
5.0000 [IU] | Freq: Once | SUBCUTANEOUS | Status: AC
  Administered 2012-06-26: 5 [IU] via SUBCUTANEOUS
  Filled 2012-06-26: qty 1

## 2012-06-26 NOTE — ED Notes (Signed)
Patient's wife called and wants patient to call her back when he returns to room.

## 2012-06-26 NOTE — ED Provider Notes (Signed)
History     CSN: [id number]  Arrival date & time 06/26/12  1210   First MD Initiated Contact with Patient 06/26/12 1230      Chief Complaint  Patient presents with  . Fall  . Altered Mental Status    (Consider location/radiation/quality/duration/timing/severity/associated sxs/prior treatment) HPI Comments: Patient was brought in by EMS after sustaining a fall. Per the family he fell out of bed last night. He states he was sitting on the side of the abdomen was going over to get a urine container and lost his balance and fell out of bed. He did not appear to be injured about time and was put back in bed by his family member. This morning when home health came into the physical therapy they felt he was confused more than normal and he was more weak than he normally was. He was unable to do his physical therapy due to his generalized weakness. Per the family here today he seems to be at his baseline mental status. She's complaining of some pain in his low back and he said he had a headache earlier but denies any headache currently. He denies any other injuries from the fall. Per his family member, he's had increasing falls over the last year. He normally walks at home with a walker. He hasn't had any recent illnesses.  Patient is a 77 y.o. male presenting with fall and altered mental status.  Fall Associated symptoms include headaches. Pertinent negatives include no fever, no numbness, no abdominal pain, no nausea, no vomiting and no hematuria.  Altered Mental Status Associated symptoms include headaches. Pertinent negatives include no chest pain, no abdominal pain and no shortness of breath.    Past Medical History  Diagnosis Date  . Hypertension   . CHF (congestive heart failure)     EF 40-45% by Echo, combined Systolic & Diastolic  . Irregular heartbeat   . Shortness of breath   . GERD (gastroesophageal reflux disease)   . Diastolic dysfunction, left ventricle 12/18/2011  . Atrial  fibrillation with rapid ventricular response, history of PAF was in SR in 08/2011 12/18/2011  . Fecal impaction 04/13/2012  . Left atrial thrombus 12/2011    By TEE 12/2011  . CKD (chronic kidney disease) stage 3, GFR 30-59 ml/min 12/26/2011  . Pneumonia 03/31/2012; 04/13/2012    Healthcare-associated pneumonia/notes 04/13/2012  . Prostate cancer   . PAF (paroxysmal atrial fibrillation) 04/15/2012    TEE in 12/2011 with LAA thrombus - no DCCV   . Type II diabetes mellitus   . Arthritis     SHOULDERS  . Symptomatic bradycardia 05/29/2012  . Tachycardia-bradycardia syndrome 05/29/2012  . S/P cardiac pacemaker procedure, [employer] 05/28/12 05/29/2012    Past Surgical History  Procedure Laterality Date  . Prostate surgery    . Tee without cardioversion  12/27/2011    Procedure: TRANSESOPHAGEAL ECHOCARDIOGRAM (TEE);  Surgeon: Chrystie Nose, MD;  Location: [facility] OR;  Service: Cardiovascular;  Laterality: N/A;  MD request Main OR  . Cardioversion  12/27/2011    Procedure: CARDIOVERSION;  Surgeon: Chrystie Nose, MD;  Location: [facility] OR;  Service: Cardiovascular;  Laterality: N/A;  . Coronary angioplasty with stent placement    . Cardioversion N/A 05/27/2012    Procedure: CARDIOVERSION;  Surgeon: Chrystie Nose, MD;  Location: [facility] OR;  Service: Cardiovascular;  Laterality: N/A;    Family History  Problem Relation Age of Onset  . Diabetes Mellitus II Mother   . Sudden death Mother   .  Prostate cancer Father     History  Substance Use Topics  . Smoking status: Former Smoker    Quit date: 05/26/1980  . Smokeless tobacco: Never Used  . Alcohol Use: No     Comment: ocassional      Review of Systems  Constitutional: Negative for fever, chills, diaphoresis and fatigue.  HENT: Negative for congestion, rhinorrhea and sneezing.   Eyes: Negative.   Respiratory: Negative for cough, chest tightness and shortness of breath.   Cardiovascular: Negative for chest pain and leg swelling.   Gastrointestinal: Negative for nausea, vomiting, abdominal pain, diarrhea and blood in stool.  Genitourinary: Negative for frequency, hematuria, flank pain and difficulty urinating.  Musculoskeletal: Positive for back pain. Negative for arthralgias.  Skin: Negative for rash.  Neurological: Positive for headaches. Negative for dizziness, speech difficulty, weakness and numbness.  Psychiatric/Behavioral: Positive for altered mental status.    Allergies  Review of patient's allergies indicates no known allergies.  Home Medications   Current Outpatient Rx  Name  Route  Sig  Dispense  Refill  . allopurinol (ZYLOPRIM) 100 MG tablet   Oral   Take 1.5 tablets (150 mg total) by mouth every morning.   30 tablet   1   . dabigatran (PRADAXA) 75 MG CAPS   Oral   Take 75 mg by mouth every 12 (twelve) hours.         . digoxin (DIGOX) 0.25 MG tablet   Oral   Take 0.25 mg by mouth daily.         Marland Kitchen diltiazem (CARDIZEM CD) 240 MG 24 hr capsule   Oral   Take 1 capsule (240 mg total) by mouth daily.   30 capsule   11   . docusate sodium (COLACE) 100 MG capsule   Oral   Take 1 capsule (100 mg total) by mouth 2 (two) times daily as needed for constipation.   30 capsule   0   . furosemide (LASIX) 40 MG tablet   Oral   Take 40 mg by mouth daily. Except on Monday, take  1 tablet twice daily         . insulin aspart protamine-insulin aspart (NOVOLOG 70/30) (70-30) 100 UNIT/ML injection   Subcutaneous   Inject 20 Units into the skin 2 (two) times daily with a meal.         . magnesium oxide (MAG-OX) 400 (241.3 MG) MG tablet   Oral   Take 1 tablet (400 mg total) by mouth 2 (two) times daily.   60 tablet   2   . metoprolol (LOPRESSOR) 100 MG tablet   Oral   Take 50 mg by mouth 2 (two) times daily.         . polyethylene glycol (MIRALAX / GLYCOLAX) packet   Oral   Take 17 g by mouth daily as needed. For constipation         . potassium chloride (K-DUR,KLOR-CON) 10 MEQ  tablet   Oral   Take 10 mEq by mouth 2 (two) times daily.           BP 161/72  Pulse 75  Temp(Src) 98.9 F (37.2 C) (Oral)  Resp 18  SpO2 100%  Physical Exam  Constitutional: He appears well-developed and well-nourished.  HENT:  Head: Normocephalic and atraumatic.  Eyes: Pupils are equal, round, and reactive to light.  Neck:  No pain to the cervical spine  Cardiovascular: Normal rate, regular rhythm and normal heart sounds.   Pulmonary/Chest: Effort normal  and breath sounds normal. No respiratory distress. He has no wheezes. He has no rales. He exhibits no tenderness.  Abdominal: Soft. Bowel sounds are normal. There is no tenderness. There is no rebound and no guarding.  Musculoskeletal: Normal range of motion. He exhibits no edema.  Patient denies any pain to his neck or upper back. He does have some tenderness to palpation in his mid and lower lumbar spine. There is no step-offs or deformities noted.  Lymphadenopathy:    He has no cervical adenopathy.  Neurological: He is alert. He has normal strength. No cranial nerve deficit or sensory deficit.  Patient is oriented to person and place. He knows the day of the week and month but did not know the year.  Skin: Skin is warm and dry. No rash noted.  Psychiatric: He has a normal mood and affect.    ED Course  Procedures (including critical care time)   Results for orders placed during the hospital encounter of 06/26/12  POCT I-STAT, CHEM 8      Result Value Range   Sodium 137  135 - 145 mEq/L   Potassium 4.5  3.5 - 5.1 mEq/L   Chloride 98  96 - 112 mEq/L   BUN 20  6 - 23 mg/dL   Creatinine, Ser 1.61  0.50 - 1.35 mg/dL   Glucose, Bld 096 (*) 70 - 99 mg/dL   Calcium, Ion 0.45 (*) 1.13 - 1.30 mmol/L   TCO2 30  0 - 100 mmol/L   Hemoglobin 17.0  13.0 - 17.0 g/dL   HCT 40.9  81.1 - 91.4 %    Dg Lumbar Spine Complete  06/26/2012  *RADIOLOGY REPORT*  Clinical Data: Fall, lower back pain  LUMBAR SPINE - COMPLETE 4+ VIEW   Comparison: MRI lumbar spine dated 05/01/2009  Findings: Five lumbar-type vertebral bodies.  Normal lumbar lordosis.  No evidence of fracture or dislocation.  Vertebral body heights are maintained.  Moderate multilevel degenerative changes in the visualized thoracolumbar spine.  Visualized bony pelvis appears intact.  Vascular calcifications.  IMPRESSION: No fracture or dislocation is seen.  Moderate multilevel degenerative changes.   Original Report Authenticated By: Charline Bills, M.D.    Ct Head Wo Contrast  06/26/2012  *RADIOLOGY REPORT*  Clinical Data:  77 year old male with headache and neck pain following head and neck injury from fall.  CT HEAD WITHOUT CONTRAST CT CERVICAL SPINE WITHOUT CONTRAST  Technique:  Multidetector CT imaging of the head and cervical spine was performed following the standard protocol without intravenous contrast.  Multiplanar CT image reconstructions of the cervical spine were also generated.  Comparison:  05/04/2009 cervical spine MR  CT HEAD  Findings: Atrophy and chronic small vessel white matter ischemic changes noted. A remote lacunar infarct within the right internal capsule noted.  No acute intracranial abnormalities are identified, including mass lesion or mass effect, hydrocephalus, extra-axial fluid collection, midline shift, hemorrhage, or acute infarction.  The visualized bony calvarium is unremarkable.  IMPRESSION: No evidence of acute intracranial abnormality.  Atrophy and chronic small vessel white matter ischemic changes.  CT CERVICAL SPINE  Findings: Normal alignment is noted. There is no evidence of acute fracture, subluxation or prevertebral soft tissue swelling. No focal bony lesions are present.  Multilevel degenerative disc disease and spondylosis noted with calcified disc protrusions throughout the cervical spine. Moderate to severe degenerative disc disease and spondylosis identified at C5-C6.  These findings contribute to mild central spinal and  foraminal narrowing at several levels.  IMPRESSION:  No static evidence of acute injury to the cervical spine.  Degenerative changes as described.   Original Report Authenticated By: Harmon Pier, M.D.    Ct Cervical Spine Wo Contrast  06/26/2012  *RADIOLOGY REPORT*  Clinical Data:  78 year old male with headache and neck pain following head and neck injury from fall.  CT HEAD WITHOUT CONTRAST CT CERVICAL SPINE WITHOUT CONTRAST  Technique:  Multidetector CT imaging of the head and cervical spine was performed following the standard protocol without intravenous contrast.  Multiplanar CT image reconstructions of the cervical spine were also generated.  Comparison:  05/04/2009 cervical spine MR  CT HEAD  Findings: Atrophy and chronic small vessel white matter ischemic changes noted. A remote lacunar infarct within the right internal capsule noted.  No acute intracranial abnormalities are identified, including mass lesion or mass effect, hydrocephalus, extra-axial fluid collection, midline shift, hemorrhage, or acute infarction.  The visualized bony calvarium is unremarkable.  IMPRESSION: No evidence of acute intracranial abnormality.  Atrophy and chronic small vessel white matter ischemic changes.  CT CERVICAL SPINE  Findings: Normal alignment is noted. There is no evidence of acute fracture, subluxation or prevertebral soft tissue swelling. No focal bony lesions are present.  Multilevel degenerative disc disease and spondylosis noted with calcified disc protrusions throughout the cervical spine. Moderate to severe degenerative disc disease and spondylosis identified at C5-C6.  These findings contribute to mild central spinal and foraminal narrowing at several levels.  IMPRESSION: No static evidence of acute injury to the cervical spine.  Degenerative changes as described.   Original Report Authenticated By: Harmon Pier, M.D.        1. Fall, initial encounter   2. Hyperglycemia       MDM  Patient's  daughters note the patient states that patient has baseline mental status. She states she's had some worsening confusion and falls over the last several months. She feels that he is no different than he normally is. His head CT did not show any evidence of intracranial hemorrhage. He has no evidence of infection. He was discharged in good condition. I advised him to keep an eye on his blood sugars over the next few days and to followup on Monday or Tuesday with her primary care physician.        Rolan Bucco, MD 06/26/12 [phone]

## 2012-06-26 NOTE — ED Notes (Signed)
Per EMS, patient fell last night at 2030.  When patient fell, he hit the back of his head on the window sill.   Patient claims that he did not incur injury at that time.   Family member found patient down last night and put him in bed.  Patient advised that he got dizzy and then just fell.  Patient said he did not lose consciousness.   This morning, patient's aid went to help patient with ADL's today and she called EMS because patient seemed confused.  Patient advised he hired an aid to come in and make his meals and help him around.

## 2012-09-28 ENCOUNTER — Other Ambulatory Visit: Payer: Self-pay | Admitting: Cardiovascular Disease

## 2012-09-28 DIAGNOSIS — I4891 Unspecified atrial fibrillation: Secondary | ICD-10-CM

## 2012-09-28 DIAGNOSIS — I495 Sick sinus syndrome: Secondary | ICD-10-CM

## 2012-09-29 ENCOUNTER — Other Ambulatory Visit: Payer: Self-pay | Admitting: Internal Medicine

## 2012-09-29 DIAGNOSIS — C61 Malignant neoplasm of prostate: Secondary | ICD-10-CM

## 2012-10-08 ENCOUNTER — Encounter (HOSPITAL_COMMUNITY): Payer: Medicare Other

## 2012-10-14 LAB — PACEMAKER DEVICE OBSERVATION

## 2012-11-02 LAB — REMOTE PACEMAKER DEVICE
AL IMPEDENCE PM: 502 Ohm
RV LEAD AMPLITUDE: 19.4 mv
RV LEAD IMPEDENCE PM: 580 Ohm
VENTRICULAR PACING PM: 29

## 2012-11-02 IMAGING — CR DG CHEST 2V
2 series · 2 of 2 positions shown · non-contrast
Comparison: 01/01/2012 and 12/28/2011.

CLINICAL DATA: Syncopal episode earlier today.  History of
diabetes.

CHEST - 2 VIEW

[w chest lat]
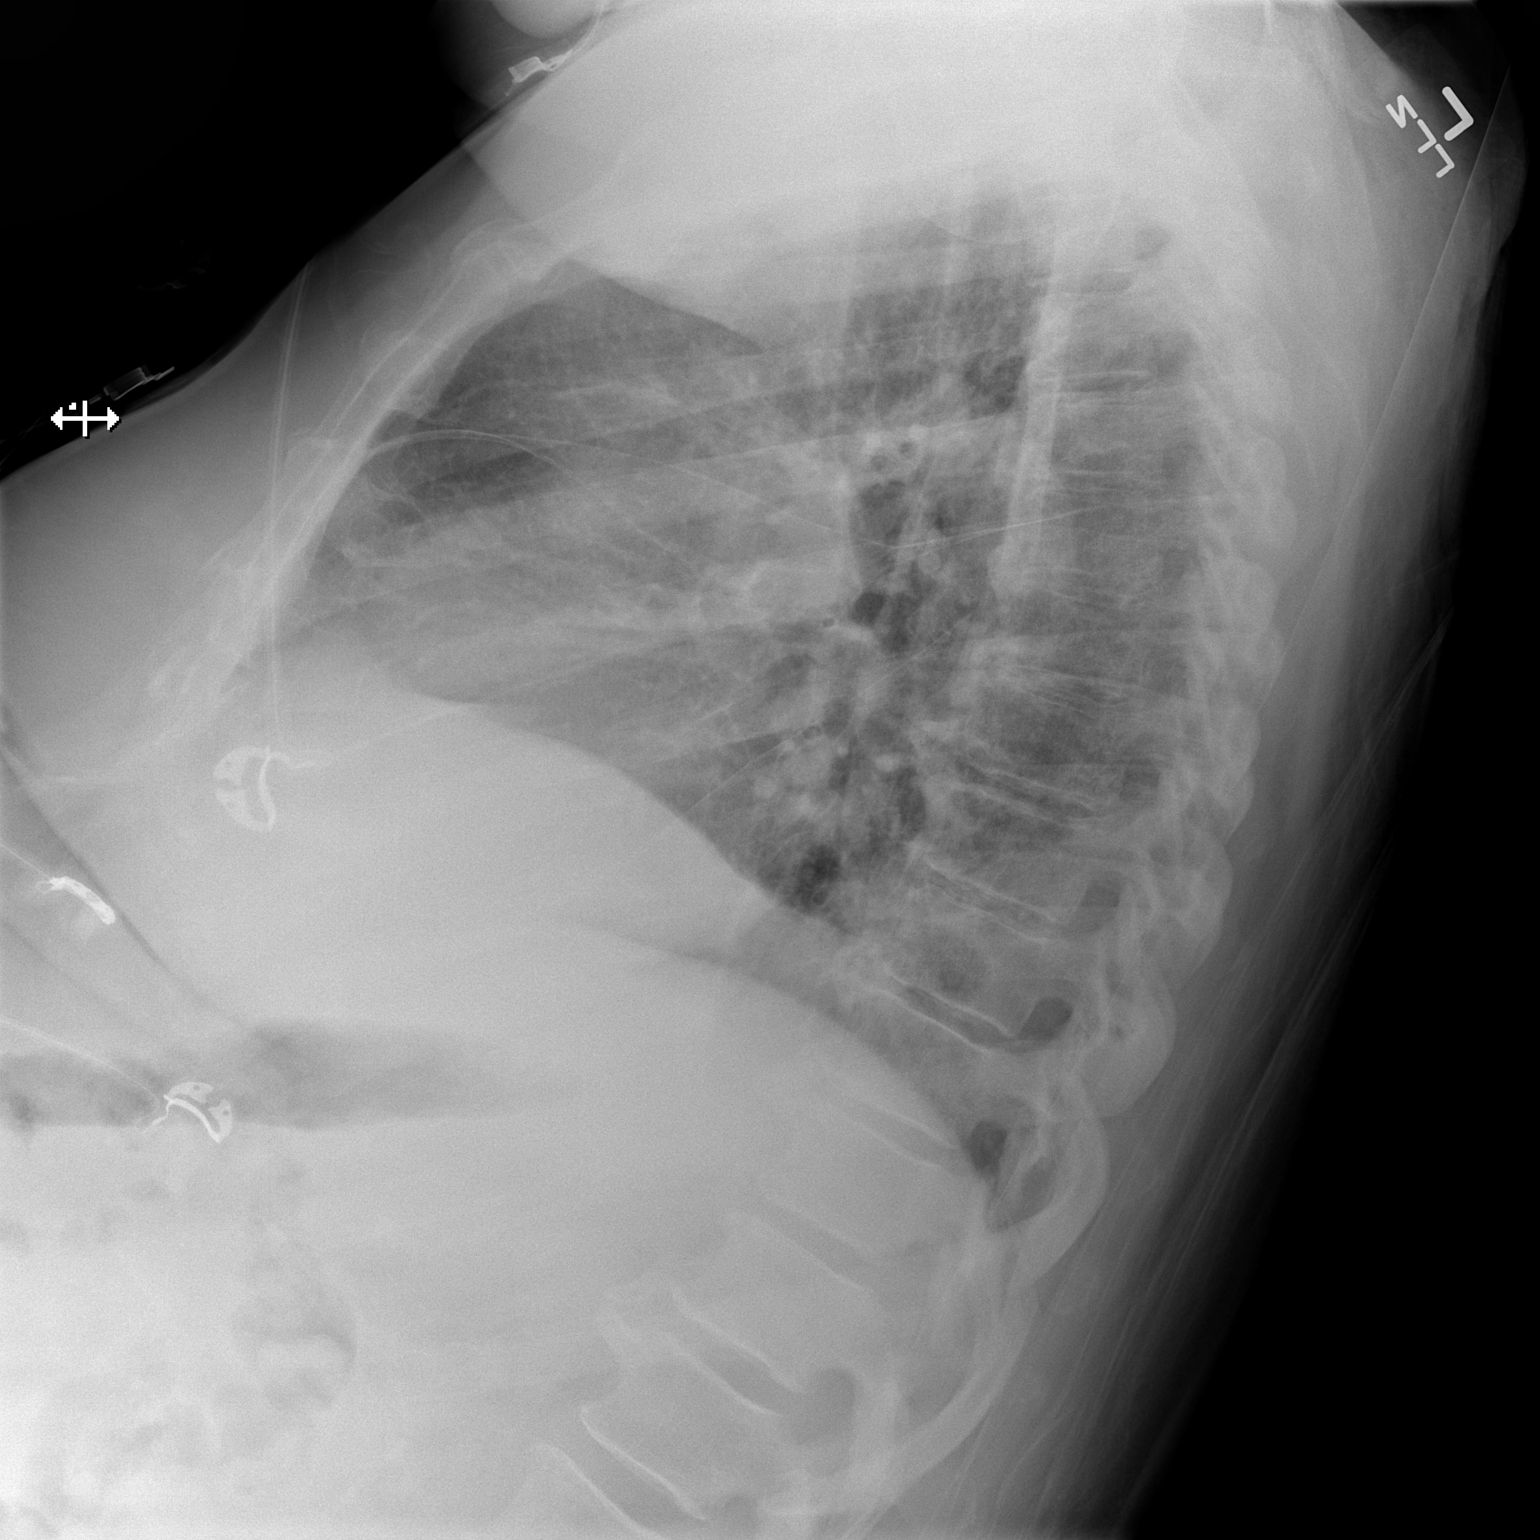

[x chest ap]
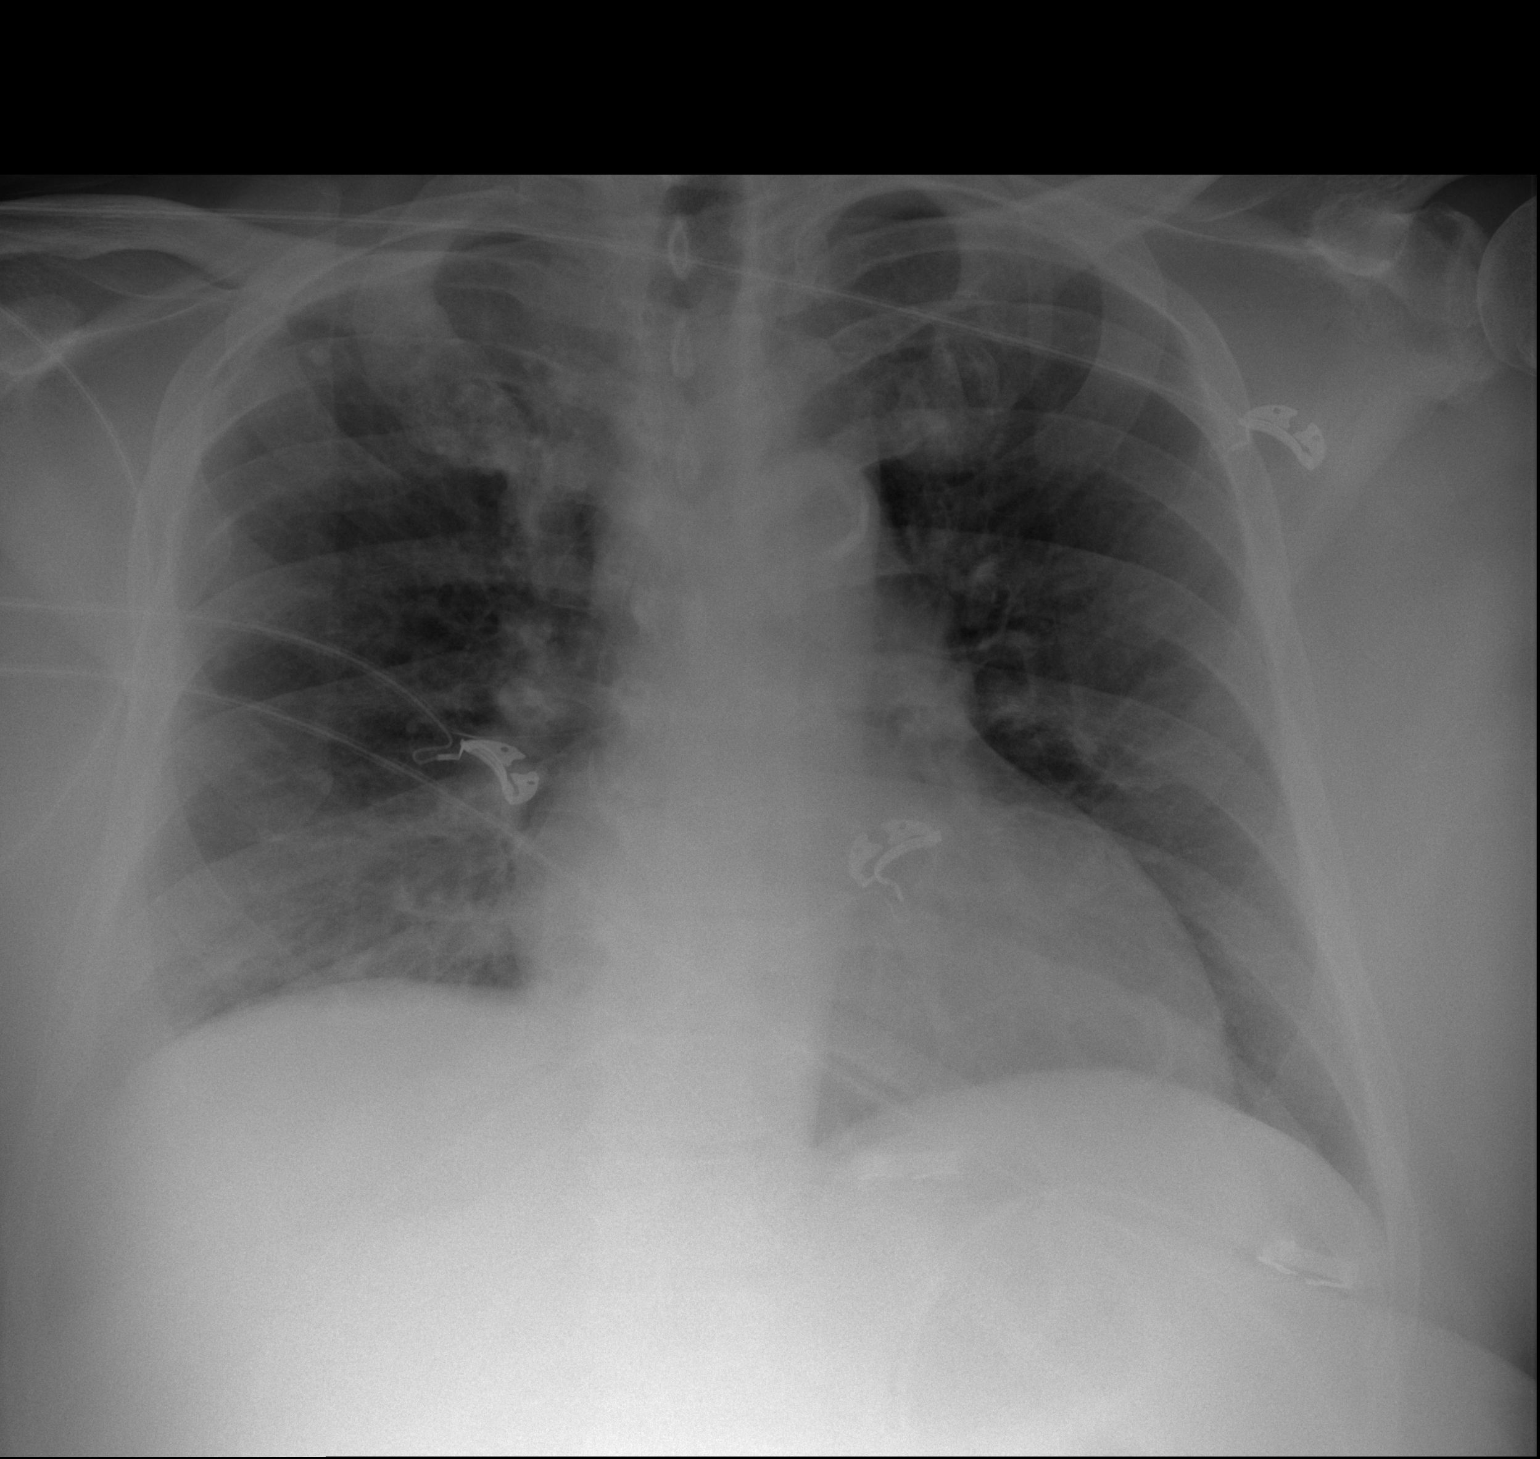

[2 of 2 positions shown; findings below may reference images not displayed]

FINDINGS: The heart size and mediastinal contours are stable.
There is stable chronic central airway thickening and fissural
thickening.  There is a small calcified right upper lobe granuloma.
No airspace disease or pleural effusion is seen.  There is an old
fracture of the right fifth rib which appears unchanged.
IMPRESSION: Stable chronic lung disease.  No acute cardiopulmonary process.

## 2012-11-07 ENCOUNTER — Encounter: Payer: Self-pay | Admitting: Internal Medicine

## 2012-11-16 ENCOUNTER — Telehealth: Payer: Self-pay | Admitting: Oncology

## 2012-11-16 NOTE — Telephone Encounter (Signed)
S/W PT NURSE MARY IN RE TO NP APPT 08/21 @ 10:30 W/DR. SHADAD REFERRING DR. Cipriano Mile HARISH DX- PROSTATE CA WELCOME PACKET MAILED.  REFERRING OFFICE IS AWARE OF NP APPT.

## 2012-11-17 ENCOUNTER — Telehealth: Payer: Self-pay | Admitting: Oncology

## 2012-11-17 NOTE — Telephone Encounter (Signed)
C/D 11/17/12 for appt. 12/03/12

## 2012-11-27 ENCOUNTER — Telehealth: Payer: Self-pay | Admitting: Oncology

## 2012-11-27 NOTE — Telephone Encounter (Signed)
RETURN CALL PT WIFE R/S NP APPT TO 09/02 @ 10:30.

## 2012-12-03 ENCOUNTER — Ambulatory Visit: Payer: Medicare Other | Admitting: Oncology

## 2012-12-03 ENCOUNTER — Other Ambulatory Visit: Payer: Medicare Other | Admitting: Lab

## 2012-12-03 ENCOUNTER — Ambulatory Visit: Payer: Medicare Other

## 2012-12-04 ENCOUNTER — Other Ambulatory Visit: Payer: Self-pay | Admitting: Oncology

## 2012-12-04 DIAGNOSIS — C61 Malignant neoplasm of prostate: Secondary | ICD-10-CM

## 2012-12-08 ENCOUNTER — Other Ambulatory Visit (HOSPITAL_COMMUNITY): Payer: Self-pay | Admitting: Cardiology

## 2012-12-09 ENCOUNTER — Telehealth: Payer: Self-pay | Admitting: Internal Medicine

## 2012-12-09 NOTE — Telephone Encounter (Signed)
He is wearing a monitor-the yellow light came on-what does that mean?-It said to call the doctor.

## 2012-12-09 NOTE — Telephone Encounter (Signed)
Returned call.  Pt stated the yellow light on the box came and said to call the doctor.  Levora Angel, CMA w/ devices notified and advised pt just needs to press the button b/c there was an upgrade on his monitor.  Pt informed and verbalized understanding.

## 2012-12-15 ENCOUNTER — Telehealth: Payer: Self-pay | Admitting: Oncology

## 2012-12-15 ENCOUNTER — Ambulatory Visit: Payer: Medicare Other

## 2012-12-15 ENCOUNTER — Encounter: Payer: Self-pay | Admitting: Oncology

## 2012-12-15 ENCOUNTER — Ambulatory Visit (HOSPITAL_BASED_OUTPATIENT_CLINIC_OR_DEPARTMENT_OTHER): Payer: Medicare Other | Admitting: Oncology

## 2012-12-15 ENCOUNTER — Other Ambulatory Visit (HOSPITAL_BASED_OUTPATIENT_CLINIC_OR_DEPARTMENT_OTHER): Payer: Medicare Other | Admitting: Lab

## 2012-12-15 VITALS — BP 154/82 | HR 61 | Temp 98.5°F | Resp 18 | Ht 69.0 in | Wt 258.0 lb

## 2012-12-15 DIAGNOSIS — C61 Malignant neoplasm of prostate: Secondary | ICD-10-CM

## 2012-12-15 DIAGNOSIS — E291 Testicular hypofunction: Secondary | ICD-10-CM

## 2012-12-15 LAB — CBC WITH DIFFERENTIAL/PLATELET
Eosinophils Absolute: 0.6 10*3/uL — ABNORMAL HIGH (ref 0.0–0.5)
HCT: 39.1 % (ref 38.4–49.9)
LYMPH%: 16.1 % (ref 14.0–49.0)
MCHC: 32.7 g/dL (ref 32.0–36.0)
MCV: 97 fL (ref 79.3–98.0)
MONO#: 0.5 10*3/uL (ref 0.1–0.9)
MONO%: 5.7 % (ref 0.0–14.0)
NEUT#: 6.7 10*3/uL — ABNORMAL HIGH (ref 1.5–6.5)
NEUT%: 71.3 % (ref 39.0–75.0)
Platelets: 171 10*3/uL (ref 140–400)
WBC: 9.4 10*3/uL (ref 4.0–10.3)

## 2012-12-15 LAB — COMPREHENSIVE METABOLIC PANEL (CC13)
Alkaline Phosphatase: 94 U/L (ref 40–150)
CO2: 24 mEq/L (ref 22–29)
Creatinine: 1.8 mg/dL — ABNORMAL HIGH (ref 0.7–1.3)
Glucose: 196 mg/dl — ABNORMAL HIGH (ref 70–140)
Total Bilirubin: 0.94 mg/dL (ref 0.20–1.20)

## 2012-12-15 NOTE — Progress Notes (Signed)
Checked in new pt with no financial concerns. °

## 2012-12-15 NOTE — Progress Notes (Signed)
Reason for Referral: Prostate cancer.   HPI: This is a pleasant 77 year old gentleman native of [place] but currently living in [place]. He is a gentleman with multiple comorbid conditions including hypertension and congestive heart failure as well as atrial fibrillation. His diagnosis of prostate cancer dates back to 2003 when he presented with a T1c tumor with a PSA of 9. Biopsy confirmed the presence of Gleason score 4+4 equals 8. He subsequently underwent external beam radiation with brachii therapy completed in April 2003 done at [facility]. His PSA went up to 4.3 in 2004 and subsequently to 7.59 in October of 2005. He was treated with androgen depravation under the care of Dr. Margo Aye and subsequently treated with combined androgen deprivation under the care of medical oncology at [facility]. He was subsequently treated with ketoconazole therapy with hydrocortisone. He has been on this medication intermittently but has been off of it at least for the last year or so. He has been receiving intermittent and fragmented and oncology care at [facility] as well as [facility] at [facility]. His most recent evaluation at the [facility] hematology/oncology revealed that his PSA was elevated at 118 as well as a testosterone level of 150. He reports his last Lupron injection under the care of Dr. Margo Aye was in July of 2014. He did have a staging CT scan and a bone scan done at [facility] on 11/05/2012 which showed predominantly local progression of his prostate cancer with an enhancing mass invading into the urinary bladder and progressive lymph node metastasis. No bony metastasis were clear on his bone scan. Overall, he has had rather poor health and last year including a prolonged hospitalization back in September of 2013 that required placement of pacemaker and  long-term anticoagulation.  Clinically, Mr. Geisen clinical status has stabilized but still in relatively poor health. He is obese and very limited in his mobility. He uses a walker at home and it urinates freely without any problems. He does not report any back pain shoulder pain or hip pain. Does not report any abdominal discomfort or GI symptoms. His appetite have been reasonable and performance status although stable as rather limited. He does report lower extremity swelling and he is on a diuretic.   Past Medical History  Diagnosis Date  . Hypertension   . CHF (congestive heart failure)     EF 40-45% by Echo, combined Systolic & Diastolic  . Irregular heartbeat   . Shortness of breath   . GERD (gastroesophageal reflux disease)   . Diastolic dysfunction, left ventricle 12/18/2011  . Atrial fibrillation with rapid ventricular response, history of PAF was in SR in 08/2011 12/18/2011  . Fecal impaction 04/13/2012  . Left atrial thrombus 12/2011    By TEE 12/2011  . CKD (chronic kidney disease) stage 3, GFR 30-59 ml/min 12/26/2011  . Pneumonia 03/31/2012; 04/13/2012    Healthcare-associated pneumonia/notes 04/13/2012  . Prostate cancer   . PAF (paroxysmal atrial fibrillation) 04/15/2012    TEE in 12/2011 with LAA thrombus - no DCCV   . Type II diabetes mellitus   . Arthritis     SHOULDERS  . Symptomatic bradycardia 05/29/2012  . Tachycardia-bradycardia syndrome 05/29/2012  . S/P cardiac pacemaker procedure, [employer] 05/28/12 05/29/2012  :  Past Surgical History  Procedure Laterality Date  . Prostate surgery    . Tee without cardioversion  12/27/2011  Procedure: TRANSESOPHAGEAL ECHOCARDIOGRAM (TEE);  Surgeon: Chrystie Nose, MD;  Location: [facility] OR;  Service: Cardiovascular;  Laterality: N/A;  MD request Main OR  . Cardioversion  12/27/2011    Procedure: CARDIOVERSION;  Surgeon: Chrystie Nose, MD;  Location: [facility] OR;  Service: Cardiovascular;  Laterality: N/A;  . Coronary  angioplasty with stent placement    . Cardioversion N/A 05/27/2012    Procedure: CARDIOVERSION;  Surgeon: Chrystie Nose, MD;  Location: [facility] OR;  Service: Cardiovascular;  Laterality: N/A;  :   Current Outpatient Prescriptions  Medication Sig Dispense Refill  . allopurinol (ZYLOPRIM) 100 MG tablet Take 1.5 tablets (150 mg total) by mouth every morning.  30 tablet  1  . dabigatran (PRADAXA) 75 MG CAPS Take 75 mg by mouth every 12 (twelve) hours.      . digoxin (DIGOX) 0.25 MG tablet Take 0.25 mg by mouth daily.      Marland Kitchen diltiazem (CARDIZEM CD) 240 MG 24 hr capsule Take 1 capsule (240 mg total) by mouth daily.  30 capsule  11  . docusate sodium (COLACE) 100 MG capsule Take 1 capsule (100 mg total) by mouth 2 (two) times daily as needed for constipation.  30 capsule  0  . furosemide (LASIX) 40 MG tablet Take 40 mg by mouth daily. Except on Monday, take  1 tablet twice daily      . insulin aspart protamine-insulin aspart (NOVOLOG 70/30) (70-30) 100 UNIT/ML injection Inject 20 Units into the skin 2 (two) times daily with a meal.      . magnesium oxide (MAG-OX) 400 (241.3 MG) MG tablet Take 1 tablet (400 mg total) by mouth 2 (two) times daily.  60 tablet  2  . metoprolol (LOPRESSOR) 100 MG tablet TAKE 1/2 TABLET BY MOUTH TWICE A DAY  30 tablet  6  . polyethylene glycol (MIRALAX / GLYCOLAX) packet Take 17 g by mouth daily as needed. For constipation      . potassium chloride (K-DUR,KLOR-CON) 10 MEQ tablet Take 10 mEq by mouth 2 (two) times daily.       No current facility-administered medications for this visit.   Current Outpatient Prescriptions  Medication Sig Dispense Refill  . allopurinol (ZYLOPRIM) 100 MG tablet Take 1.5 tablets (150 mg total) by mouth every morning.  30 tablet  1  . dabigatran (PRADAXA) 75 MG CAPS Take 75 mg by mouth every 12 (twelve) hours.      . digoxin (DIGOX) 0.25 MG tablet Take 0.25 mg by mouth daily.      Marland Kitchen diltiazem (CARDIZEM CD) 240 MG 24 hr capsule Take 1 capsule  (240 mg total) by mouth daily.  30 capsule  11  . docusate sodium (COLACE) 100 MG capsule Take 1 capsule (100 mg total) by mouth 2 (two) times daily as needed for constipation.  30 capsule  0  . furosemide (LASIX) 40 MG tablet Take 40 mg by mouth daily. Except on Monday, take  1 tablet twice daily      . insulin aspart protamine-insulin aspart (NOVOLOG 70/30) (70-30) 100 UNIT/ML injection Inject 20 Units into the skin 2 (two) times daily with a meal.      . magnesium oxide (MAG-OX) 400 (241.3 MG) MG tablet Take 1 tablet (400 mg total) by mouth 2 (two) times daily.  60 tablet  2  . metoprolol (LOPRESSOR) 100 MG tablet TAKE 1/2 TABLET BY MOUTH TWICE A DAY  30 tablet  6  . polyethylene glycol (MIRALAX / GLYCOLAX) packet Take 17  g by mouth daily as needed. For constipation      . potassium chloride (K-DUR,KLOR-CON) 10 MEQ tablet Take 10 mEq by mouth 2 (two) times daily.       No current facility-administered medications for this visit.      No Known Allergies:  Family History  Problem Relation Age of Onset  . Diabetes Mellitus II Mother   . Sudden death Mother   . Prostate cancer Father   :  History   Social History  . Marital Status: Married    Spouse Name: N/A    Number of Children: N/A  . Years of Education: N/A   Occupational History  . Not on file.   Social History Main Topics  . Smoking status: Former Smoker    Quit date: 05/26/1980  . Smokeless tobacco: Never Used  . Alcohol Use: No     Comment: ocassional  . Drug Use: No  . Sexual Activity: Not Currently   Other Topics Concern  . Not on file   Social History Narrative  . No narrative on file  :  Pertinent items are noted in HPI.  Exam: Blood pressure 154/82, pulse 61, temperature 98.5 F (36.9 C), temperature source Oral, resp. rate 18, height 5\' 9"  (1.753 m), weight 258 lb (117.028 kg), SpO2 100.00%. General appearance: alert, cooperative and appears stated age Head: Normocephalic, without obvious  abnormality, atraumatic Eyes: conjunctivae/corneas clear. PERRL, EOM's intact. Fundi benign. Throat: lips, mucosa, and tongue normal; teeth and gums normal Neck: no adenopathy, no carotid bruit, no JVD, supple, symmetrical, trachea midline and thyroid not enlarged, symmetric, no tenderness/mass/nodules Back: negative Resp: clear to auscultation bilaterally Chest wall: no tenderness Cardio: regular rate and rhythm, S1, S2 normal, no murmur, click, rub or gallop GI: soft, non-tender; bowel sounds normal; no masses,  no organomegaly Extremities: extremities normal, atraumatic, no cyanosis or edema Pulses: 2+ and symmetric   Recent Labs  12/15/12 1049  WBC 9.4  HGB 12.8*  HCT 39.1  PLT 171    Recent Labs  12/15/12 1049  NA 143  K 3.8  CO2 24  GLUCOSE 196*  BUN 27.7*  CREATININE 1.8*  CALCIUM 9.7       Assessment and Plan:   77 year old gentleman with the following issues:  1. Prostate cancer: His initial diagnosis was in 2003 with a PSA of 9 and a Gleason score 4+4 equals 8. He was initially treated with external beam radiation and that a Brachy therapy completed in April of 2003. Later he had a rise in his PSA as high as 7.59 and was treated with androgen depravation therapy under the care of Dr. Margo Aye with intermittent anti-androgen agent such as Casodex briefly as well. Later on in his course, second line hormonal manipulation in the form of ketoconazole and hydrocortisone was introduced under the care of Dr. Leanor Kail but he has not been compliant with this medication due to personal decisions as well as other comorbid conditions and medication interactions. He had intermittent hospitalizations towards the end of 2013 due to cardiac issues and he has not been on any prostate cancer therapy he'll recently. Per his report, he was treated with androgen depravation agent under the care of his urologist Dr. Margo Aye in July of 2014. Staging workup as mentioned showed predominantly local  recurrence of his disease with a PSA around 118 without any castrate levels of testosterone. The natural course of prostate cancer were discussed as well as the treatment options today. These treatment options would  include either agent such as Zytiga, Xtandi and possible chemotherapy. But before we initiated that's a like to repeat his PSA and testosterone level to make sure that he is indeed developing castration resistant disease. For the time being, I will not add any new agent to until I  trending his PSA one way or another.  2. Androgen deprivation: He needs to be continued on this and we will be happy to continue that here at the [facility].  3. Urinary symptoms: He has little at this point but he is at risk of developing urinary retention and I have alerted him to the fact and he is to followup with his urologist with any signs of urine decline.  4. Followup will arrange a quick followup for him to readdress the above-mentioned issues.

## 2012-12-15 NOTE — Telephone Encounter (Signed)
gv and printed appt sched and avs for pt for OCT. °

## 2012-12-16 LAB — PSA: PSA: 56.69 ng/mL — ABNORMAL HIGH (ref <=4.00)

## 2012-12-16 LAB — TESTOSTERONE: Testosterone: 60 ng/dL — ABNORMAL LOW (ref 300–890)

## 2013-01-01 ENCOUNTER — Telehealth: Payer: Self-pay | Admitting: *Deleted

## 2013-01-01 NOTE — Telephone Encounter (Signed)
Lm informed the pt that on 01/22/13 his arrival time had changed. gv appt for 01/22/13 w/ labs@ 12:45pm and ov@ 1:15pm. Made the pt aware that i will mail a letter/cal...td

## 2013-01-08 ENCOUNTER — Emergency Department (INDEPENDENT_AMBULATORY_CARE_PROVIDER_SITE_OTHER)
Admission: EM | Admit: 2013-01-08 | Discharge: 2013-01-08 | Disposition: A | Discharge status: Home / Self Care | Payer: Medicare Other | Source: Home / Self Care

## 2013-01-08 ENCOUNTER — Encounter (HOSPITAL_COMMUNITY): Payer: Self-pay | Admitting: Emergency Medicine

## 2013-01-08 DIAGNOSIS — L02419 Cutaneous abscess of limb, unspecified: Secondary | ICD-10-CM

## 2013-01-08 DIAGNOSIS — L03119 Cellulitis of unspecified part of limb: Secondary | ICD-10-CM

## 2013-01-08 MED ORDER — SILVER SULFADIAZINE 1 % EX CREA
TOPICAL_CREAM | Freq: Once | CUTANEOUS | Status: AC
  Administered 2013-01-08: 17:00:00 via TOPICAL

## 2013-01-08 MED ORDER — SILVER SULFADIAZINE 1 % EX CREA: TOPICAL_CREAM | Freq: Every day | CUTANEOUS | Status: DC

## 2013-01-08 NOTE — ED Provider Notes (Signed)
Medical screening examination/treatment/procedure(s) were performed by non-physician practitioner and as supervising physician I was immediately available for consultation/collaboration.  Leslee Home, M.D.  Reuben Likes, MD 01/08/13 [id number]

## 2013-01-08 NOTE — ED Provider Notes (Signed)
CSN: [id number]     Arrival date & time 01/08/13  1552 History   None    Chief Complaint  Patient presents with  . Blister    left leg. seen on tues by primary. currently on anitbiotic. area went down but has swollen up again.    (Consider location/radiation/quality/duration/timing/severity/associated sxs/prior Treatment) HPI Comments: 77 year old male with a long list acute and chronic illnesses. He noticed a blister to the left lower leg approximately one week ago. A couple days later he visited his PCP who prescribed doxycycline. When his wife changed the dressing today she saw that the blister had accumulated a small amount of clear fluid. She was concerned and called her PCP who advised him to come to the urgent care. There are no other associated symptoms. No constitutional symptoms. The best the family can say is that there does not appear to be any significant changes although he has had a little more lower edema in usual. His PCP he recently increased his Lasix. His PMH includes hypertension, CHF, PAF, atrial fibrillation, left atrial thrombus, CK D., prostate CA,2 p.m., tachycardia bradycardia syndrome, peripheral edema, and others.   Past Medical History  Diagnosis Date  . Hypertension   . CHF (congestive heart failure)     EF 40-45% by Echo, combined Systolic & Diastolic  . Irregular heartbeat   . Shortness of breath   . GERD (gastroesophageal reflux disease)   . Diastolic dysfunction, left ventricle 12/18/2011  . Atrial fibrillation with rapid ventricular response, history of PAF was in SR in 08/2011 12/18/2011  . Fecal impaction 04/13/2012  . Left atrial thrombus 12/2011    By TEE 12/2011  . CKD (chronic kidney disease) stage 3, GFR 30-59 ml/min 12/26/2011  . Pneumonia 03/31/2012; 04/13/2012    Healthcare-associated pneumonia/notes 04/13/2012  . Prostate cancer   . PAF (paroxysmal atrial fibrillation) 04/15/2012    TEE in 12/2011 with LAA thrombus - no DCCV   . Type II diabetes  mellitus   . Arthritis     SHOULDERS  . Symptomatic bradycardia 05/29/2012  . Tachycardia-bradycardia syndrome 05/29/2012  . S/P cardiac pacemaker procedure, [employer] 05/28/12 05/29/2012   Past Surgical History  Procedure Laterality Date  . Prostate surgery    . Tee without cardioversion  12/27/2011    Procedure: TRANSESOPHAGEAL ECHOCARDIOGRAM (TEE);  Surgeon: Chrystie Nose, MD;  Location: [facility] OR;  Service: Cardiovascular;  Laterality: N/A;  MD request Main OR  . Cardioversion  12/27/2011    Procedure: CARDIOVERSION;  Surgeon: Chrystie Nose, MD;  Location: [facility] OR;  Service: Cardiovascular;  Laterality: N/A;  . Coronary angioplasty with stent placement    . Cardioversion N/A 05/27/2012    Procedure: CARDIOVERSION;  Surgeon: Chrystie Nose, MD;  Location: [facility] OR;  Service: Cardiovascular;  Laterality: N/A;   Family History  Problem Relation Age of Onset  . Diabetes Mellitus II Mother   . Sudden death Mother   . Prostate cancer Father    History  Substance Use Topics  . Smoking status: Former Smoker    Quit date: 05/26/1980  . Smokeless tobacco: Never Used  . Alcohol Use: No     Comment: ocassional    Review of Systems  Constitutional: Negative for fever, activity change and fatigue.  HENT: Negative.   Respiratory: Negative.   Genitourinary: Negative.   Skin: Positive for color change and wound.  Neurological: Negative.     Allergies  Review of patient's allergies indicates no known allergies.  Home  Medications   Current Outpatient Rx  Name  Route  Sig  Dispense  Refill  . allopurinol (ZYLOPRIM) 100 MG tablet   Oral   Take 1.5 tablets (150 mg total) by mouth every morning.   30 tablet   1   . dabigatran (PRADAXA) 75 MG CAPS   Oral   Take 75 mg by mouth every 12 (twelve) hours.         . digoxin (DIGOX) 0.25 MG tablet   Oral   Take 0.25 mg by mouth daily.         Marland Kitchen diltiazem (CARDIZEM CD) 240 MG 24 hr capsule   Oral   Take 1 capsule (240 mg  total) by mouth daily.   30 capsule   11   . furosemide (LASIX) 40 MG tablet   Oral   Take 40 mg by mouth daily. Except on Monday, take  1 tablet twice daily         . insulin aspart protamine-insulin aspart (NOVOLOG 70/30) (70-30) 100 UNIT/ML injection   Subcutaneous   Inject 20 Units into the skin 2 (two) times daily with a meal.         . magnesium oxide (MAG-OX) 400 (241.3 MG) MG tablet   Oral   Take 1 tablet (400 mg total) by mouth 2 (two) times daily.   60 tablet   2   . metoprolol (LOPRESSOR) 100 MG tablet      TAKE 1/2 TABLET BY MOUTH TWICE A DAY   30 tablet   6     Needs appt   . polyethylene glycol (MIRALAX / GLYCOLAX) packet   Oral   Take 17 g by mouth daily as needed. For constipation         . potassium chloride (K-DUR,KLOR-CON) 10 MEQ tablet   Oral   Take 10 mEq by mouth 2 (two) times daily.         Marland Kitchen docusate sodium (COLACE) 100 MG capsule   Oral   Take 1 capsule (100 mg total) by mouth 2 (two) times daily as needed for constipation.   30 capsule   0   . silver sulfADIAZINE (SILVADENE) 1 % cream   Topical   Apply topically daily.   50 g   0    BP 152/63  Pulse 103  Temp(Src) 97.8 F (36.6 C) (Oral)  Resp 16  SpO2 100% Physical Exam  Nursing note and vitals reviewed. Constitutional: He is oriented to person, place, and time.  Patient is sitting in a chair, morbidly obese alert and oriented and does not appear to be in any acute distress  Neck: Neck supple.  Cardiovascular:  Borderline tachycardia  Pulmonary/Chest: Effort normal. No respiratory distress.  Musculoskeletal: He exhibits edema and tenderness.  Neurological: He is alert and oriented to person, place, and time.  Skin: Skin is warm and dry.  There is a proximally 4 x 5 annular area of minor erythema with an overlying vesicle formation. This has been previously ruptured and there is a small amount of clear fluid in the dependent position. There is minor erythema surrounding  this area but according to family this has not changed much. The amount of edema in the affected leg is similar to that of the right leg which is unaffected by infection. No lymphangitis, no purulence.  Psychiatric: He has a normal mood and affect.    ED Course  Procedures (including critical care time) Labs Review Labs Reviewed - No data to display  Imaging Review No results found.  MDM   1. Bacterial skin infection of leg      Seen the wound prior to today and does not appear to be any worsening. The greatest concern was that the original vesicle had a cannulated a very small amount of clear fluid. This fluid was removed primarily to satisfied that the patient and family. We will apply Silvadene dressing and have him rinse it once or twice a day and to reapply. Continue taking the doxycycline twice a day and followup with your PCP. For any worsening new symptoms or problems recheck promptly.  Hayden Rasmussen, NP 01/08/13 1708

## 2013-01-08 NOTE — ED Notes (Signed)
C/o blister of left lower leg. Pt was seen by primary on Tuesday and started on antibiotics. States today it is worse.  Area has swollen up again / retaining fluid.   Pt has been using ace wrap with no relief. Denies fever and any other symptoms.

## 2013-01-12 ENCOUNTER — Emergency Department (HOSPITAL_COMMUNITY): Payer: Medicare Other

## 2013-01-12 ENCOUNTER — Inpatient Hospital Stay (HOSPITAL_COMMUNITY)
Admission: EM | Admit: 2013-01-12 | Discharge: 2013-01-14 | DRG: 074 | Disposition: A | Discharge status: Home Health | Payer: Medicare Other | Attending: Internal Medicine | Admitting: Internal Medicine

## 2013-01-12 ENCOUNTER — Encounter (HOSPITAL_COMMUNITY): Payer: Self-pay | Admitting: *Deleted

## 2013-01-12 DIAGNOSIS — I872 Venous insufficiency (chronic) (peripheral): Secondary | ICD-10-CM | POA: Diagnosis present

## 2013-01-12 DIAGNOSIS — Z9861 Coronary angioplasty status: Secondary | ICD-10-CM

## 2013-01-12 DIAGNOSIS — L97309 Non-pressure chronic ulcer of unspecified ankle with unspecified severity: Secondary | ICD-10-CM

## 2013-01-12 DIAGNOSIS — I5040 Unspecified combined systolic (congestive) and diastolic (congestive) heart failure: Secondary | ICD-10-CM | POA: Diagnosis present

## 2013-01-12 DIAGNOSIS — L0390 Cellulitis, unspecified: Secondary | ICD-10-CM

## 2013-01-12 DIAGNOSIS — Z79899 Other long term (current) drug therapy: Secondary | ICD-10-CM

## 2013-01-12 DIAGNOSIS — I83003 Varicose veins of unspecified lower extremity with ulcer of ankle: Secondary | ICD-10-CM | POA: Diagnosis present

## 2013-01-12 DIAGNOSIS — Z8546 Personal history of malignant neoplasm of prostate: Secondary | ICD-10-CM

## 2013-01-12 DIAGNOSIS — E1149 Type 2 diabetes mellitus with other diabetic neurological complication: Principal | ICD-10-CM | POA: Diagnosis present

## 2013-01-12 DIAGNOSIS — Z950 Presence of cardiac pacemaker: Secondary | ICD-10-CM

## 2013-01-12 DIAGNOSIS — E1142 Type 2 diabetes mellitus with diabetic polyneuropathy: Secondary | ICD-10-CM | POA: Diagnosis present

## 2013-01-12 DIAGNOSIS — L0291 Cutaneous abscess, unspecified: Secondary | ICD-10-CM

## 2013-01-12 DIAGNOSIS — I509 Heart failure, unspecified: Secondary | ICD-10-CM | POA: Diagnosis present

## 2013-01-12 DIAGNOSIS — L02419 Cutaneous abscess of limb, unspecified: Secondary | ICD-10-CM | POA: Diagnosis present

## 2013-01-12 DIAGNOSIS — K219 Gastro-esophageal reflux disease without esophagitis: Secondary | ICD-10-CM | POA: Diagnosis present

## 2013-01-12 DIAGNOSIS — I83023 Varicose veins of left lower extremity with ulcer of ankle: Secondary | ICD-10-CM

## 2013-01-12 DIAGNOSIS — I129 Hypertensive chronic kidney disease with stage 1 through stage 4 chronic kidney disease, or unspecified chronic kidney disease: Secondary | ICD-10-CM | POA: Diagnosis present

## 2013-01-12 DIAGNOSIS — I5033 Acute on chronic diastolic (congestive) heart failure: Secondary | ICD-10-CM

## 2013-01-12 DIAGNOSIS — Z7901 Long term (current) use of anticoagulants: Secondary | ICD-10-CM

## 2013-01-12 DIAGNOSIS — I4891 Unspecified atrial fibrillation: Secondary | ICD-10-CM | POA: Diagnosis present

## 2013-01-12 DIAGNOSIS — E1169 Type 2 diabetes mellitus with other specified complication: Secondary | ICD-10-CM | POA: Diagnosis present

## 2013-01-12 DIAGNOSIS — Z87891 Personal history of nicotine dependence: Secondary | ICD-10-CM

## 2013-01-12 DIAGNOSIS — E119 Type 2 diabetes mellitus without complications: Secondary | ICD-10-CM | POA: Diagnosis present

## 2013-01-12 DIAGNOSIS — N183 Chronic kidney disease, stage 3 unspecified: Secondary | ICD-10-CM | POA: Diagnosis present

## 2013-01-12 DIAGNOSIS — M129 Arthropathy, unspecified: Secondary | ICD-10-CM | POA: Diagnosis present

## 2013-01-12 DIAGNOSIS — Z794 Long term (current) use of insulin: Secondary | ICD-10-CM

## 2013-01-12 LAB — BASIC METABOLIC PANEL
CO2: 26 mEq/L (ref 19–32)
Chloride: 103 mEq/L (ref 96–112)
GFR calc non Af Amer: 35 mL/min — ABNORMAL LOW (ref >90)
Glucose, Bld: 113 mg/dL — ABNORMAL HIGH (ref 70–99)
Potassium: 3.5 mEq/L (ref 3.5–5.1)
Sodium: 142 mEq/L (ref 135–145)

## 2013-01-12 LAB — GLUCOSE, CAPILLARY: Glucose-Capillary: 141 mg/dL — ABNORMAL HIGH (ref 70–99)

## 2013-01-12 LAB — CBC WITH DIFFERENTIAL/PLATELET
Eosinophils Absolute: 1.1 10*3/uL — ABNORMAL HIGH (ref 0.0–0.7)
Lymphocytes Relative: 19 % (ref 12–46)
Lymphs Abs: 1.9 10*3/uL (ref 0.7–4.0)
Neutro Abs: 6.2 10*3/uL (ref 1.7–7.7)
Neutrophils Relative %: 63 % (ref 43–77)
Platelets: 159 10*3/uL (ref 150–400)
RBC: 3.89 MIL/uL — ABNORMAL LOW (ref 4.22–5.81)
WBC: 10 10*3/uL (ref 4.0–10.5)

## 2013-01-12 MED ORDER — ALLOPURINOL 100 MG PO TABS
100.0000 mg | ORAL_TABLET | Freq: Every day | ORAL | Status: DC
  Administered 2013-01-13 – 2013-01-14 (×2): 100 mg via ORAL
  Filled 2013-01-12 (×2): qty 1

## 2013-01-12 MED ORDER — VANCOMYCIN HCL IN DEXTROSE 1-5 GM/200ML-% IV SOLN
1000.0000 mg | Freq: Once | INTRAVENOUS | Status: AC
  Administered 2013-01-12: 03:00:00 1000 mg via INTRAVENOUS
  Filled 2013-01-12: qty 200

## 2013-01-12 MED ORDER — SODIUM CHLORIDE 0.9 % IV SOLN
INTRAVENOUS | Status: DC
  Administered 2013-01-12: 21:00:00 via INTRAVENOUS

## 2013-01-12 MED ORDER — ACETAMINOPHEN 325 MG PO TABS
650.0000 mg | ORAL_TABLET | Freq: Four times a day (QID) | ORAL | Status: DC | PRN
  Administered 2013-01-13: 650 mg via ORAL
  Filled 2013-01-12: qty 2

## 2013-01-12 MED ORDER — METOPROLOL TARTRATE 50 MG PO TABS
50.0000 mg | ORAL_TABLET | Freq: Two times a day (BID) | ORAL | Status: DC
  Administered 2013-01-13 – 2013-01-14 (×3): 50 mg via ORAL
  Filled 2013-01-12 (×4): qty 1

## 2013-01-12 MED ORDER — VANCOMYCIN HCL 10 G IV SOLR
1500.0000 mg | INTRAVENOUS | Status: DC
  Administered 2013-01-13: 1500 mg via INTRAVENOUS
  Filled 2013-01-12 (×2): qty 1500

## 2013-01-12 MED ORDER — NAPROXEN 250 MG PO TABS
500.0000 mg | ORAL_TABLET | Freq: Every day | ORAL | Status: DC | PRN
  Filled 2013-01-12 (×2): qty 2

## 2013-01-12 MED ORDER — SILVER SULFADIAZINE 1 % EX CREA
TOPICAL_CREAM | Freq: Every day | CUTANEOUS | Status: DC
  Filled 2013-01-12: qty 85

## 2013-01-12 MED ORDER — POTASSIUM CHLORIDE CRYS ER 10 MEQ PO TBCR
10.0000 meq | EXTENDED_RELEASE_TABLET | Freq: Two times a day (BID) | ORAL | Status: DC
  Administered 2013-01-13: 10 meq via ORAL
  Filled 2013-01-12 (×2): qty 1

## 2013-01-12 MED ORDER — FUROSEMIDE 40 MG PO TABS
40.0000 mg | ORAL_TABLET | Freq: Every day | ORAL | Status: DC
  Administered 2013-01-13: 40 mg via ORAL
  Filled 2013-01-12: qty 1

## 2013-01-12 MED ORDER — DOCUSATE SODIUM 100 MG PO CAPS: 100.0000 mg | ORAL_CAPSULE | Freq: Two times a day (BID) | ORAL | Status: DC | PRN

## 2013-01-12 MED ORDER — VANCOMYCIN HCL IN DEXTROSE 1-5 GM/200ML-% IV SOLN
1000.0000 mg | Freq: Once | INTRAVENOUS | Status: AC
  Administered 2013-01-12: 1000 mg via INTRAVENOUS
  Filled 2013-01-12: qty 200

## 2013-01-12 MED ORDER — POLYETHYLENE GLYCOL 3350 17 G PO PACK
17.0000 g | PACK | Freq: Every day | ORAL | Status: DC | PRN
  Filled 2013-01-12: qty 1

## 2013-01-12 MED ORDER — DILTIAZEM HCL ER COATED BEADS 240 MG PO CP24
240.0000 mg | ORAL_CAPSULE | Freq: Every day | ORAL | Status: DC
  Administered 2013-01-13 – 2013-01-14 (×2): 240 mg via ORAL
  Filled 2013-01-12 (×2): qty 1

## 2013-01-12 MED ORDER — INSULIN ASPART PROT & ASPART (70-30 MIX) 100 UNIT/ML ~~LOC~~ SUSP
20.0000 [IU] | Freq: Two times a day (BID) | SUBCUTANEOUS | Status: DC
  Administered 2013-01-13 (×2): 20 [IU] via SUBCUTANEOUS
  Filled 2013-01-12: qty 10

## 2013-01-12 MED ORDER — DIGOXIN 125 MCG PO TABS
0.1250 mg | ORAL_TABLET | ORAL | Status: DC
  Administered 2013-01-13: 10:00:00 0.125 mg via ORAL
  Filled 2013-01-12: qty 1

## 2013-01-12 MED ORDER — DABIGATRAN ETEXILATE MESYLATE 75 MG PO CAPS
75.0000 mg | ORAL_CAPSULE | Freq: Two times a day (BID) | ORAL | Status: DC
  Administered 2013-01-13 – 2013-01-14 (×4): 75 mg via ORAL
  Filled 2013-01-12 (×5): qty 1

## 2013-01-12 NOTE — H&P (Signed)
[facility] History and Physical  Vincent [name] ZOX:[id number] DOB: 24-Sep-1935 DOA: 01/12/2013  Referring physician: ED PCP: [name], MD  Chief Complaint: Venous stasis ulcer  HPI: Vincent [name] is a 77 y.o. male who presents to the ED with a venous stasis ulcer on his LLE ankle.  This appears as a large ruptured bullae that has worsened over the past week despite treatment with antibiotics and outpatient wound care.  The ulcer is painless but he attributes this to his history of diabetic neuropathy.  Patient presented to the ED as instructed since his ulcer has been getting no better.  Review of Systems: 12 systems reviewed and otherwise negative.  Past Medical History  Diagnosis Date  . Hypertension   . CHF (congestive heart failure)     EF 40-45% by Echo, combined Systolic & Diastolic  . Irregular heartbeat   . Shortness of breath   . GERD (gastroesophageal reflux disease)   . Diastolic dysfunction, left ventricle 12/18/2011  . Atrial fibrillation with rapid ventricular response, history of PAF was in SR in 08/2011 12/18/2011  . Fecal impaction 04/13/2012  . Left atrial thrombus 12/2011    By TEE 12/2011  . CKD (chronic kidney disease) stage 3, GFR 30-59 ml/min 12/26/2011  . Pneumonia 03/31/2012; 04/13/2012    Healthcare-associated pneumonia/notes 04/13/2012  . Prostate cancer   . PAF (paroxysmal atrial fibrillation) 04/15/2012    TEE in 12/2011 with LAA thrombus - no DCCV   . Type II diabetes mellitus   . Arthritis     SHOULDERS  . Symptomatic bradycardia 05/29/2012  . Tachycardia-bradycardia syndrome 05/29/2012  . S/P cardiac pacemaker procedure, [employer] 05/28/12 05/29/2012   Past Surgical History  Procedure Laterality Date  . Prostate surgery    . Tee without cardioversion  12/27/2011    Procedure: TRANSESOPHAGEAL ECHOCARDIOGRAM (TEE);  Surgeon: Chrystie Nose, MD;  Location: [facility] OR;  Service: Cardiovascular;  Laterality: N/A;  MD request Main OR  .  Cardioversion  12/27/2011    Procedure: CARDIOVERSION;  Surgeon: Chrystie Nose, MD;  Location: [facility] OR;  Service: Cardiovascular;  Laterality: N/A;  . Coronary angioplasty with stent placement    . Cardioversion N/A 05/27/2012    Procedure: CARDIOVERSION;  Surgeon: Chrystie Nose, MD;  Location: [facility] OR;  Service: Cardiovascular;  Laterality: N/A;   Social History:  reports that he quit smoking about 32 years ago. He has never used smokeless tobacco. He reports that he does not drink alcohol or use illicit drugs.   No Known Allergies  Family History  Problem Relation Age of Onset  . Diabetes Mellitus II Mother   . Sudden death Mother   . Prostate cancer Father      Prior to Admission medications   Medication Sig Start Date End Date Taking? Authorizing Provider  acetaminophen (TYLENOL) 325 MG tablet Take 650 mg by mouth every 6 (six) hours as needed for pain.   Yes Historical Provider, MD  allopurinol (ZYLOPRIM) 100 MG tablet Take 100 mg by mouth daily.   Yes Historical Provider, MD  dabigatran (PRADAXA) 75 MG CAPS Take 75 mg by mouth every 12 (twelve) hours.   Yes Historical Provider, MD  digoxin (LANOXIN) 0.125 MG tablet Take 0.125 mg by mouth every other day.   Yes Historical Provider, MD  diltiazem (CARDIZEM CD) 240 MG 24 hr capsule Take 1 capsule (240 mg total) by mouth daily. 05/29/12  Yes [name], NP  docusate sodium (COLACE) 100 MG capsule Take  1 capsule (100 mg total) by mouth 2 (two) times daily as needed for constipation. 05/29/12  Yes [name], NP  doxycycline (VIBRA-TABS) 100 MG tablet Take 100 mg by mouth daily.   Yes Historical Provider, MD  furosemide (LASIX) 40 MG tablet Take 40 mg by mouth daily. Except on Monday, take  1 tablet twice daily   Yes Historical Provider, MD  insulin aspart protamine-insulin aspart (NOVOLOG 70/30) (70-30) 100 UNIT/ML injection Inject 20 Units into the skin 2 (two) times daily with a meal. 04/10/12  Yes Daniel J Angiulli, PA-C   metoprolol (LOPRESSOR) 100 MG tablet Take 50 mg by mouth 2 (two) times daily.   Yes Historical Provider, MD  naproxen sodium (ANAPROX) 220 MG tablet Take 440 mg by mouth as needed (pain).   Yes Historical Provider, MD  polyethylene glycol (MIRALAX / GLYCOLAX) packet Take 17 g by mouth daily as needed. For constipation   Yes Historical Provider, MD  potassium chloride (K-DUR,KLOR-CON) 10 MEQ tablet Take 10 mEq by mouth 2 (two) times daily.   Yes Historical Provider, MD  silver sulfADIAZINE (SILVADENE) 1 % cream Apply topically daily. 01/08/13  Yes Hayden Rasmussen, NP   Physical Exam: Filed Vitals:   01/12/13 1930  BP: 155/85  Pulse: 100  Temp:   Resp:     General:  NAD, resting comfortably in bed Eyes: PEERLA EOMI ENT: mucous membranes moist Neck: supple w/o JVD Cardiovascular: RRR w/o MRG Respiratory: CTA B Abdomen: soft, nt, nd, bs+ Skin: 4 cm, round ulcer located on LLE, mild surrounding erythema, erythema at medial thigh, lymphangitic spread Musculoskeletal: MAE, full ROM all 4 extremities Psychiatric: normal tone and affect Neurologic: AAOx3, grossly non-focal  Labs on Admission:  Basic Metabolic Panel:  Recent Labs Lab 01/12/13 1940  NA 142  K 3.5  CL 103  CO2 26  GLUCOSE 113*  BUN 34*  CREATININE 1.82*  CALCIUM 9.0   Liver Function Tests: No results found for this basename: AST, ALT, ALKPHOS, BILITOT, PROT, ALBUMIN,  in the last 168 hours No results found for this basename: LIPASE, AMYLASE,  in the last 168 hours No results found for this basename: AMMONIA,  in the last 168 hours CBC:  Recent Labs Lab 01/12/13 1940  WBC 10.0  NEUTROABS 6.2  HGB 12.2*  HCT 37.2*  MCV 95.6  PLT 159   Cardiac Enzymes: No results found for this basename: CKTOTAL, CKMB, CKMBINDEX, TROPONINI,  in the last 168 hours  BNP (last 3 results)  Recent Labs  03/31/12 2120 04/13/12 1852  PROBNP 8718.0* 6402.0*   CBG:  Recent Labs Lab 01/12/13 1830  GLUCAP 141*     Radiological Exams on Admission: Dg Tibia/fibula Left  01/12/2013   CLINICAL DATA:  Open wound. Pain and swelling.  EXAM: LEFT TIBIA AND FIBULA - 2 VIEW  COMPARISON:  None.  FINDINGS: No acute bony findings. The knee and ankle joints are maintained. There is diffuse subcutaneous soft tissue swelling/ edema.  IMPRESSION: No acute bony findings.   Electronically Signed   By: Loralie Champagne M.D.   On: 01/12/2013 20:17    EKG: Independently reviewed.  Assessment/Plan Principal Problem:   Venous stasis ulcer of ankle Active Problems:   DM2 (diabetes mellitus, type 2)   1. Venous stasis ulcer of LLE ankle - failed outpatient treatment, ordering vancomycin for infection though he has no systemic symptoms at this time, wound care evaluation. 2. DM2 - continue home 70/30, CBG checks AC/HS    Code Status: Full  Code (must indicate code status--if unknown or must be presumed, indicate so) Family Communication: Family at bedside (indicate person spoken with, if applicable, with phone number if by telephone) Disposition Plan: Admit to inpatient (indicate anticipated LOS)  Time spent: 70 min  Jonhatan Hearty M. [facility] Pager [phone]  If 7PM-7AM, please contact night-coverage [email or web address] Password [email or web address] 01/12/2013, 10:15 PM

## 2013-01-12 NOTE — ED Provider Notes (Signed)
CSN: [id number]     Arrival date & time 01/12/13  1626 History   First MD Initiated Contact with Patient 01/12/13 1919     Chief Complaint  Patient presents with  . Leg Swelling   (Consider location/radiation/quality/duration/timing/severity/associated sxs/prior Treatment) The history is provided by the patient.   patient here complaining of bilateral lower extremity edema with large ulcer to his left mid anterior tibia. Does increase redness from that area extending up into his left medial thigh. Notes chills but no recorded fever. No vomiting or diarrhea. Was seen at urgent care 4 days ago for same were as the blister was debrided. Patient seen by his wound care nurse today and sent here for further evaluation. Patient is currently taking antibiotics for his infection  Past Medical History  Diagnosis Date  . Hypertension   . CHF (congestive heart failure)     EF 40-45% by Echo, combined Systolic & Diastolic  . Irregular heartbeat   . Shortness of breath   . GERD (gastroesophageal reflux disease)   . Diastolic dysfunction, left ventricle 12/18/2011  . Atrial fibrillation with rapid ventricular response, history of PAF was in SR in 08/2011 12/18/2011  . Fecal impaction 04/13/2012  . Left atrial thrombus 12/2011    By TEE 12/2011  . CKD (chronic kidney disease) stage 3, GFR 30-59 ml/min 12/26/2011  . Pneumonia 03/31/2012; 04/13/2012    Healthcare-associated pneumonia/notes 04/13/2012  . Prostate cancer   . PAF (paroxysmal atrial fibrillation) 04/15/2012    TEE in 12/2011 with LAA thrombus - no DCCV   . Type II diabetes mellitus   . Arthritis     SHOULDERS  . Symptomatic bradycardia 05/29/2012  . Tachycardia-bradycardia syndrome 05/29/2012  . S/P cardiac pacemaker procedure, [employer] 05/28/12 05/29/2012   Past Surgical History  Procedure Laterality Date  . Prostate surgery    . Tee without cardioversion  12/27/2011    Procedure: TRANSESOPHAGEAL ECHOCARDIOGRAM (TEE);  Surgeon:  Chrystie Nose, MD;  Location: [facility] OR;  Service: Cardiovascular;  Laterality: N/A;  MD request Main OR  . Cardioversion  12/27/2011    Procedure: CARDIOVERSION;  Surgeon: Chrystie Nose, MD;  Location: [facility] OR;  Service: Cardiovascular;  Laterality: N/A;  . Coronary angioplasty with stent placement    . Cardioversion N/A 05/27/2012    Procedure: CARDIOVERSION;  Surgeon: Chrystie Nose, MD;  Location: [facility] OR;  Service: Cardiovascular;  Laterality: N/A;   Family History  Problem Relation Age of Onset  . Diabetes Mellitus II Mother   . Sudden death Mother   . Prostate cancer Father    History  Substance Use Topics  . Smoking status: Former Smoker    Quit date: 05/26/1980  . Smokeless tobacco: Never Used  . Alcohol Use: No     Comment: ocassional    Review of Systems  All other systems reviewed and are negative.    Allergies  Review of patient's allergies indicates no known allergies.  Home Medications   Current Outpatient Rx  Name  Route  Sig  Dispense  Refill  . acetaminophen (TYLENOL) 325 MG tablet   Oral   Take 650 mg by mouth every 6 (six) hours as needed for pain.         Marland Kitchen allopurinol (ZYLOPRIM) 100 MG tablet   Oral   Take 100 mg by mouth daily.         . dabigatran (PRADAXA) 75 MG CAPS   Oral   Take 75 mg by mouth every  12 (twelve) hours.         . digoxin (LANOXIN) 0.125 MG tablet   Oral   Take 0.125 mg by mouth every other day.         . diltiazem (CARDIZEM CD) 240 MG 24 hr capsule   Oral   Take 1 capsule (240 mg total) by mouth daily.   30 capsule   11   . docusate sodium (COLACE) 100 MG capsule   Oral   Take 1 capsule (100 mg total) by mouth 2 (two) times daily as needed for constipation.   30 capsule   0   . doxycycline (VIBRA-TABS) 100 MG tablet   Oral   Take 100 mg by mouth daily.         . furosemide (LASIX) 40 MG tablet   Oral   Take 40 mg by mouth daily. Except on Monday, take  1 tablet twice daily         . insulin  aspart protamine-insulin aspart (NOVOLOG 70/30) (70-30) 100 UNIT/ML injection   Subcutaneous   Inject 20 Units into the skin 2 (two) times daily with a meal.         . metoprolol (LOPRESSOR) 100 MG tablet   Oral   Take 50 mg by mouth 2 (two) times daily.         . naproxen sodium (ANAPROX) 220 MG tablet   Oral   Take 440 mg by mouth as needed (pain).         . polyethylene glycol (MIRALAX / GLYCOLAX) packet   Oral   Take 17 g by mouth daily as needed. For constipation         . potassium chloride (K-DUR,KLOR-CON) 10 MEQ tablet   Oral   Take 10 mEq by mouth 2 (two) times daily.         . silver sulfADIAZINE (SILVADENE) 1 % cream   Topical   Apply topically daily.   50 g   0    BP 130/97  Pulse 95  Temp(Src) 98 F (36.7 C) (Oral)  Resp 18  SpO2 97% Physical Exam  Nursing note and vitals reviewed. Constitutional: He is oriented to person, place, and time. He appears well-developed and well-nourished.  Non-toxic appearance. No distress.  HENT:  Head: Normocephalic and atraumatic.  Eyes: Conjunctivae, EOM and lids are normal. Pupils are equal, round, and reactive to light.  Neck: Normal range of motion. Neck supple. No tracheal deviation present. No mass present.  Cardiovascular: Normal rate, regular rhythm and normal heart sounds.  Exam reveals no gallop.   No murmur heard. Pulmonary/Chest: Effort normal and breath sounds normal. No stridor. No respiratory distress. He has no decreased breath sounds. He has no wheezes. He has no rhonchi. He has no rales.  Abdominal: Soft. Normal appearance and bowel sounds are normal. He exhibits no distension. There is no tenderness. There is no rebound and no CVA tenderness.  Musculoskeletal: Normal range of motion. He exhibits no edema and no tenderness.  3+ pitting edema bilateral lower extremities. Left lower extremity with 4 cm round ulcer noted at the mid anterior tibia with surrounding erythema with lymphangitic spread going  up the left medial thigh. No crepitus noted. Right great toe with clear blisters noted. Erythema noted to right lower extremity with some redness at the medial thigh  Neurological: He is alert and oriented to person, place, and time. He has normal strength. No cranial nerve deficit or sensory deficit. GCS eye subscore is  4. GCS verbal subscore is 5. GCS motor subscore is 6.  Skin: Skin is warm and dry. No abrasion and no rash noted.  Psychiatric: He has a normal mood and affect. His speech is normal and behavior is normal.    ED Course  Procedures (including critical care time) Labs Review Labs Reviewed  GLUCOSE, CAPILLARY - Abnormal; Notable for the following:    Glucose-Capillary 141 (*)    All other components within normal limits  CBC WITH DIFFERENTIAL  BASIC METABOLIC PANEL   Imaging Review No results found.  MDM  No diagnosis found. Patient with cellulitis and will require admission for IV antibiotics. Will consult hospitalist    Toy Baker, MD 01/12/13 [phone]

## 2013-01-12 NOTE — ED Notes (Signed)
Pt reports having swelling to bilateral legs, has draining wound to left lower leg and wound to right big toe. Bandages applied pta. Pt requesting a wound dr. No acute distress noted at triage.

## 2013-01-12 NOTE — Progress Notes (Signed)
ANTIBIOTIC CONSULT NOTE - INITIAL  Pharmacy Consult for vancomycin Indication: cellulitis  No Known Allergies  Patient Measurements:   Adjusted Body Weight:   Vital Signs: Temp: 98 F (36.7 C) (09/30 1637) Temp src: Oral (09/30 1637) BP: 138/72 mmHg (09/30 2215) Pulse Rate: 98 (09/30 2215) Intake/Output from previous day:   Intake/Output from this shift:    Labs:  Recent Labs  01/12/13 1940  WBC 10.0  HGB 12.2*  PLT 159  CREATININE 1.82*   The CrCl is unknown because both a height and weight (above a minimum accepted value) are required for this calculation. No results found for this basename: VANCOTROUGH, VANCOPEAK, VANCORANDOM, GENTTROUGH, GENTPEAK, GENTRANDOM, TOBRATROUGH, TOBRAPEAK, TOBRARND, AMIKACINPEAK, AMIKACINTROU, AMIKACIN,  in the last 72 hours   Microbiology: No results found for this or any previous visit (from the past 720 hour(s)).  Medical History: Past Medical History  Diagnosis Date  . Hypertension   . CHF (congestive heart failure)     EF 40-45% by Echo, combined Systolic & Diastolic  . Irregular heartbeat   . Shortness of breath   . GERD (gastroesophageal reflux disease)   . Diastolic dysfunction, left ventricle 12/18/2011  . Atrial fibrillation with rapid ventricular response, history of PAF was in SR in 08/2011 12/18/2011  . Fecal impaction 04/13/2012  . Left atrial thrombus 12/2011    By TEE 12/2011  . CKD (chronic kidney disease) stage 3, GFR 30-59 ml/min 12/26/2011  . Pneumonia 03/31/2012; 04/13/2012    Healthcare-associated pneumonia/notes 04/13/2012  . Prostate cancer   . PAF (paroxysmal atrial fibrillation) 04/15/2012    TEE in 12/2011 with LAA thrombus - no DCCV   . Type II diabetes mellitus   . Arthritis     SHOULDERS  . Symptomatic bradycardia 05/29/2012  . Tachycardia-bradycardia syndrome 05/29/2012  . S/P cardiac pacemaker procedure, [employer], [employer] 05/28/12 05/29/2012    Medications:  Anti-infectives   Start     Dose/Rate  Route Frequency Ordered Stop   01/13/13 2300  vancomycin (VANCOCIN) 1,500 mg in sodium chloride 0.9 % 500 mL IVPB     1,500 mg 250 mL/hr over 120 Minutes Intravenous Every 24 hours 01/12/13 2226     01/12/13 2230  vancomycin (VANCOCIN) IVPB 1000 mg/200 mL premix     1,000 mg 200 mL/hr over 60 Minutes Intravenous  Once 01/12/13 2226     01/12/13 1945  vancomycin (VANCOCIN) IVPB 1000 mg/200 mL premix     1,000 mg 200 mL/hr over 60 Minutes Intravenous  Once 01/12/13 1930 01/12/13 2139     Assessment: 77 yom presented to the ED with leg swelling. To start empiric vanc for cellulitis. Failed outpatient doxycycline. Pt is afebrile and WBC is WNL. Scr is elevated at 1.82 (known history of CKD). He has received 1gm vanc so far in the ED.   Goal of Therapy:  Vancomycin trough level 10-15 mcg/ml  Plan:  1. Give additional vanc 1gm for a total loading dose of 2gm 2. Starting tomorrow, start vancomycin 1500mg  IV Q24H 3. F/u renal fxn, C&S, clinical status and trough at [facility]  Kristain Hu, Drake Leach 01/12/2013,10:27 PM

## 2013-01-12 NOTE — ED Notes (Signed)
Patient is resting comfortably. 

## 2013-01-13 ENCOUNTER — Encounter (HOSPITAL_COMMUNITY): Payer: Self-pay | Admitting: *Deleted

## 2013-01-13 LAB — BASIC METABOLIC PANEL
Calcium: 9 mg/dL (ref 8.4–10.5)
Creatinine, Ser: 1.74 mg/dL — ABNORMAL HIGH (ref 0.50–1.35)
GFR calc Af Amer: 42 mL/min — ABNORMAL LOW (ref >90)
GFR calc non Af Amer: 37 mL/min — ABNORMAL LOW (ref >90)
Glucose, Bld: 85 mg/dL (ref 70–99)
Potassium: 3.1 mEq/L — ABNORMAL LOW (ref 3.5–5.1)
Sodium: 142 mEq/L (ref 135–145)

## 2013-01-13 LAB — GLUCOSE, CAPILLARY
Glucose-Capillary: 94 mg/dL (ref 70–99)
Glucose-Capillary: 114 mg/dL — ABNORMAL HIGH (ref 70–99)
Glucose-Capillary: 101 mg/dL — ABNORMAL HIGH (ref 70–99)
Glucose-Capillary: 91 mg/dL (ref 70–99)

## 2013-01-13 LAB — CBC
MCH: 31.7 pg (ref 26.0–34.0)
MCHC: 33.1 g/dL (ref 30.0–36.0)
RDW: 15.9 % — ABNORMAL HIGH (ref 11.5–15.5)

## 2013-01-13 MED ORDER — POTASSIUM CHLORIDE CRYS ER 20 MEQ PO TBCR
60.0000 meq | EXTENDED_RELEASE_TABLET | Freq: Four times a day (QID) | ORAL | Status: AC
  Administered 2013-01-13 (×2): 60 meq via ORAL
  Filled 2013-01-13 (×2): qty 3

## 2013-01-13 MED ORDER — HYDROCODONE-ACETAMINOPHEN 5-325 MG PO TABS
1.0000 | ORAL_TABLET | Freq: Four times a day (QID) | ORAL | Status: DC | PRN
  Administered 2013-01-13 – 2013-01-14 (×2): 1 via ORAL
  Filled 2013-01-13 (×2): qty 1

## 2013-01-13 MED ORDER — FUROSEMIDE 10 MG/ML IJ SOLN
40.0000 mg | Freq: Two times a day (BID) | INTRAMUSCULAR | Status: DC
  Administered 2013-01-13 – 2013-01-14 (×2): 40 mg via INTRAVENOUS
  Filled 2013-01-13 (×4): qty 4

## 2013-01-13 NOTE — Care Management Note (Signed)
    Page 1 of 2   01/14/2013     12:32:03 PM   CARE MANAGEMENT NOTE 01/14/2013  Patient:  Vincent, GREENAWALT [name]   Account Number:  [id number]  Date Initiated:  01/13/2013  Documentation initiated by:  Letha Cape  Subjective/Objective Assessment:   dx venous stasis ulcer of ankle  admit- lives with spouse.  patient has an aide 5 days /week.     Action/Plan:   pt eval-   Anticipated DC Date:  01/14/2013   Anticipated DC Plan:  HOME W HOME HEALTH SERVICES      DC Planning Services  CM consult      [facility] Choice  HOME HEALTH   Choice offered to / List presented to:  C-1 Patient        HH arranged  HH-1 RN  HH-2 PT      [facility] agency  [facility]   Status of service:  Completed, signed off Medicare Important Message given?   (If response is "NO", the following Medicare IM given date fields will be blank) Date Medicare IM given:   Date Additional Medicare IM given:    Discharge Disposition:  HOME W HOME HEALTH SERVICES  Per UR Regulation:  Reviewed for med. necessity/level of care/duration of stay  If discussed at Long Length of Stay Meetings, dates discussed:    Comments:  01/14/13 12:27 Letha Cape RN, BSN [phone] patient is for dc today, patient states his wife worked with [facility] in the past so he would like to work with [facility] ,  I spoke with daughter, Vincent [name], and she also agreed with [facility] for patient for [facility] and HHPT.  Patient will have [name] dry dressing change which needs to be monitored for infection and also pt.  Patient has transportation at [employer], he states his aide will be pickiing him up and bringing him some clothes.  Referral made to [facility], Lupita Leash notified.  Soc will begin 24-48 hrs post discharge.  01/13/13 15:06 Letha Cape RN, BSN [phone] patient lives with spouse , patient has an aide 5 days/week.  Await pt eval.  Patient states he thinks his spouse has worked with [facility] in the past.  I informed him I will speak with his daugther , Vincent [name] , in the am concerning the hh  agency they want to work with.

## 2013-01-13 NOTE — Consult Note (Signed)
WOC consult Note Reason for Consult: Consult requested for left leg wound.  Pt states he developed a blister this week and it ruptured.  Wound type:Previous blister has evolved into a full thickness stasis ulcer. Measurement:7X7X.1cm Wound bed:100% moist and red Drainage (amount, consistency, odor) mod pink drainage. Periwound: loose edges of nonviable blister are peeling and remove easily when wiped with moist gauze. Dressing procedure/placement/frequency: Aquacel to absorb drainage and provide antimicrobial benefits. Family at bedside to discuss plan of care and assess wound. Appear to understand plan of care and deny further questions at this time. Please re-consult if further assistance is needed.  Thank-you,  Cammie Mcgee MSN, RN, CWOCN, [place], CNS [phone]

## 2013-01-13 NOTE — Progress Notes (Signed)
Utilization review completed. Macky Galik RN CCM Case Mgmt  

## 2013-01-13 NOTE — Progress Notes (Signed)
[facility] PROGRESS NOTE  Vincent [name] GEX:[id number] DOB: 07-23-1935 DOA: 01/12/2013 PCP: [name], MD  HPI/Subjective: Denies any fever or chills.  Assessment/Plan: Principal Problem:   Venous stasis ulcer of ankle Active Problems:   DM2 (diabetes mellitus, type 2)   Left lower extremity cellulitis -With evidence of stasis ulcer. -Patient failed outpatient doxycycline treatment for about one week. -Started on vancomycin, wound care team to follow  Diabetes mellitus type 2 -Continue home dose of insulin patient is on 70/30 insulin mix. -Insulin sliding scale and hemoglobin A1c to be checked. Carbohydrate modified diet.  History of combined CHF -Patient has lower extremity edema, we'll diuresis as tolerated, D/C IV fluids. -Continued preadmission home medications.  Paroxysmal atrial fibrillation -Rate is controlled with Lopressor, digoxin and Cardizem. -Patient is on Pradaxa for anticoagulation.  Code Status: Full code Family Communication: Plan discussed with the patient. Disposition Plan: Remains inpatient   Consultants:  WOC team  Procedures:  None  Antibiotics:  Vancomycin  Objective: Filed Vitals:   01/13/13 0824  BP: 144/87  Pulse: 105  Temp: 98.3 F (36.8 C)  Resp: 20    Intake/Output Summary (Last 24 hours) at 01/13/13 1145 Last data filed at 01/13/13 1000  Gross per 24 hour  Intake    240 ml  Output   1000 ml  Net   -760 ml   Filed Weights   01/12/13 2253  Weight: 121.7 kg (268 lb 4.8 oz)    Exam: General: Alert and awake, oriented x3, not in any acute distress. HEENT: anicteric sclera, pupils reactive to light and accommodation, EOMI CVS: S1-S2 clear, no murmur rubs or gallops Chest: clear to auscultation bilaterally, no wheezing, rales or rhonchi Abdomen: soft nontender, nondistended, normal bowel sounds, no organomegaly Extremities: no cyanosis, clubbing or edema noted bilaterally Neuro: Cranial nerves II-XII  intact, no focal neurological deficits  Data Reviewed: Basic Metabolic Panel:  Recent Labs Lab 01/12/13 1940 01/13/13 0400  NA 142 142  K 3.5 3.1*  CL 103 102  CO2 26 31  GLUCOSE 113* 85  BUN 34* 32*  CREATININE 1.82* 1.74*  CALCIUM 9.0 9.0   Liver Function Tests: No results found for this basename: AST, ALT, ALKPHOS, BILITOT, PROT, ALBUMIN,  in the last 168 hours No results found for this basename: LIPASE, AMYLASE,  in the last 168 hours No results found for this basename: AMMONIA,  in the last 168 hours CBC:  Recent Labs Lab 01/12/13 1940 01/13/13 0400  WBC 10.0 9.2  NEUTROABS 6.2  --   HGB 12.2* 11.5*  HCT 37.2* 34.7*  MCV 95.6 95.6  PLT 159 156   Cardiac Enzymes: No results found for this basename: CKTOTAL, CKMB, CKMBINDEX, TROPONINI,  in the last 168 hours BNP (last 3 results)  Recent Labs  03/31/12 2120 04/13/12 1852  PROBNP 8718.0* 6402.0*   CBG:  Recent Labs Lab 01/12/13 1830 01/13/13 0752  GLUCAP 141* 94    Micro No results found for this or any previous visit (from the past 240 hour(s)).   Studies: Dg Tibia/fibula Left  01/12/2013   CLINICAL DATA:  Open wound. Pain and swelling.  EXAM: LEFT TIBIA AND FIBULA - 2 VIEW  COMPARISON:  None.  FINDINGS: No acute bony findings. The knee and ankle joints are maintained. There is diffuse subcutaneous soft tissue swelling/ edema.  IMPRESSION: No acute bony findings.   Electronically Signed   By: Loralie Champagne M.D.   On: 01/12/2013 20:17    Scheduled Meds: . allopurinol  100 mg Oral Daily  . dabigatran  75 mg Oral Q12H  . digoxin  0.125 mg Oral QODAY  . diltiazem  240 mg Oral Daily  . furosemide  40 mg Oral Daily  . insulin aspart protamine- aspart  20 Units Subcutaneous BID WC  . metoprolol  50 mg Oral BID  . potassium chloride  10 mEq Oral BID  . vancomycin  1,500 mg Intravenous Q24H   Continuous Infusions: . sodium chloride 125 mL/hr at 01/12/13 2035       Time spent: 35  minutes    [facility]  [facility] Pager [phone] If 7PM-7AM, please contact night-coverage at [email or web address], password [facility] 01/13/2013, 11:45 AM  LOS: 1 day

## 2013-01-13 NOTE — Progress Notes (Signed)
Brief Nutrition Note:  RD pulled to pt for positive malnutrition screening tool. Reported unsure of possible unintentional weight loss.  Weight hx reviewed, pt with weight gain over the past 7 months.  Wt Readings from Last 5 Encounters:  01/12/13 268 lb 4.8 oz (121.7 kg)  12/15/12 258 lb (117.028 kg)  05/29/12 248 lb (112.492 kg)  05/29/12 248 lb (112.492 kg)  05/29/12 248 lb (112.492 kg)   Body mass index is 39.07 kg/(m^2). obesity class 2  Diet: Carb Mod medium  Intake: 100% of last meal documented   Chart reviewed, no nutrition interventions warranted at this time. Please consult as needed.   Isabell Jarvis RD, LDN Pager [phone] After Hours pager [phone]

## 2013-01-14 DIAGNOSIS — I4891 Unspecified atrial fibrillation: Secondary | ICD-10-CM

## 2013-01-14 DIAGNOSIS — I509 Heart failure, unspecified: Secondary | ICD-10-CM

## 2013-01-14 DIAGNOSIS — I5033 Acute on chronic diastolic (congestive) heart failure: Secondary | ICD-10-CM

## 2013-01-14 DIAGNOSIS — Z7901 Long term (current) use of anticoagulants: Secondary | ICD-10-CM

## 2013-01-14 LAB — BASIC METABOLIC PANEL
BUN: 31 mg/dL — ABNORMAL HIGH (ref 6–23)
Calcium: 9 mg/dL (ref 8.4–10.5)
Creatinine, Ser: 1.72 mg/dL — ABNORMAL HIGH (ref 0.50–1.35)
GFR calc Af Amer: 43 mL/min — ABNORMAL LOW (ref >90)
Glucose, Bld: 66 mg/dL — ABNORMAL LOW (ref 70–99)
Potassium: 3.8 mEq/L (ref 3.5–5.1)
Sodium: 142 mEq/L (ref 135–145)

## 2013-01-14 LAB — GLUCOSE, CAPILLARY
Glucose-Capillary: 40 mg/dL — CL (ref 70–99)
Glucose-Capillary: 116 mg/dL — ABNORMAL HIGH (ref 70–99)
Glucose-Capillary: 118 mg/dL — ABNORMAL HIGH (ref 70–99)

## 2013-01-14 MED ORDER — FUROSEMIDE 40 MG PO TABS: 40.0000 mg | ORAL_TABLET | Freq: Two times a day (BID) | ORAL | Status: DC

## 2013-01-14 MED ORDER — INSULIN ASPART PROT & ASPART (70-30 MIX) 100 UNIT/ML ~~LOC~~ SUSP
18.0000 [IU] | Freq: Two times a day (BID) | SUBCUTANEOUS | Status: DC
Start: 2013-01-14 — End: 2013-01-21

## 2013-01-14 MED ORDER — HYDROCODONE-ACETAMINOPHEN 5-325 MG PO TABS: 1.0000 | ORAL_TABLET | Freq: Four times a day (QID) | ORAL | Status: DC | PRN

## 2013-01-14 MED ORDER — SULFAMETHOXAZOLE-TMP DS 800-160 MG PO TABS: 1.0000 | ORAL_TABLET | Freq: Two times a day (BID) | ORAL | Status: DC

## 2013-01-14 NOTE — Discharge Summary (Signed)
Physician Discharge Summary  Vincent [name] UJW:[id number] DOB: 1935/08/22 DOA: 01/12/2013  PCP: [name], MD  Admit date: 01/12/2013 Discharge date: 01/14/2013  Time spent: 40 minutes  Recommendations for Outpatient Follow-up:  1. Followup with primary care physician within one week. 2. Check BMP in 1 week as patient Lasix dose increased.  Discharge Diagnoses:  Principal Problem:   Venous stasis ulcer of ankle Active Problems:   DM2 (diabetes mellitus, type 2)   Discharge Condition: Stable  Diet recommendation: Carbohydrate modified diet  Filed Weights   01/12/13 2253  Weight: 121.7 kg (268 lb 4.8 oz)    History of present illness:  WETZEL MEESTER is a 77 y.o. male who presents to the ED with a venous stasis ulcer on his LLE ankle. This appears as a large ruptured bullae that has worsened over the past week despite treatment with antibiotics and outpatient wound care. The ulcer is painless but he attributes this to his history of diabetic neuropathy. Patient presented to the ED as instructed since his ulcer has been getting no better  Hospital Course:   1. Left lower extremity cellulitis: Patient did have doxycycline for 7 days prior to admission, patient still have evidence of cellulitis, on admission doxycycline was switched to vancomycin. Patient was seen by the wound care team and recommended foam to dry the secretions.On discharge Bactrim for 5 more days was prescribed. Patient will have PT and wound nurse and his home health service.  2. Diabetes mellitus type 2: Patient did develop hypoglycemia today on his home dose of 70/30 insulin mix, the low CBG was 40, patient said he is not eating like he is eating at home, his insulin decreased to 18 units twice a day, he was on carbohydrate modified diet.  3. History combined CHF: Patient has chronic lower extremity edema, he is on Lasix daily and twice a day on Mondays only. While he was in the hospital Lasix was switched to  IV, on discharge it was increased to 40 mg twice a day. Patient was asked to elevate his legs while he is at home.  4. Paroxysmal atrial fibrillation: Patient rate is controlled with Lopressor, digoxin and Cardizem. Patient is on Primaxin for anticoagulation. All of his medication was continued throughout the hospital stay.  Procedures:  None  Consultations:  Wound/ostomy care team  Discharge Exam: Filed Vitals:   01/14/13 1130  BP: 158/85  Pulse:   Temp:   Resp:    General: Alert and awake, oriented x3, not in any acute distress. HEENT: anicteric sclera, pupils reactive to light and accommodation, EOMI CVS: S1-S2 clear, no murmur rubs or gallops Chest: clear to auscultation bilaterally, no wheezing, rales or rhonchi Abdomen: soft nontender, nondistended, normal bowel sounds, no organomegaly Extremities: no cyanosis, clubbing or edema noted bilaterally Neuro: Cranial nerves II-XII intact, no focal neurological deficits  Discharge Instructions  Discharge Orders   Future Appointments Provider Department Dept Phone   01/22/2013 12:45 PM [facility] [phone]   01/22/2013 1:15 PM Benjiman Core, MD [facility] MEDICAL ONCOL[facility] [phone]   02/22/2013 9:00 AM Chrystie Nose, MD [facility] [phone]   Future Orders Complete By Expires   Diet Carb Modified  As directed    Face-to-face encounter (required for Medicare/Medicaid patients)  As directed    Comments:     I Vincent [name] A certify that this patient is under my care and that I, or a nurse practitioner  or physician's assistant working with me, had a face-to-face encounter that meets the physician face-to-face encounter requirements with this patient on 01/14/2013. The encounter with the patient was in whole, or in part for the following medical condition(s) which is the primary reason for home health care (List medical condition): LLE wound,  CHF   Questions:     The encounter with the patient was in whole, or in part, for the following medical condition, which is the primary reason for home health care:  LLE wound, CHF   I certify that, based on my findings, the following services are medically necessary home health services:  Nursing   Physical therapy   My clinical findings support the need for the above services:  Can transfer bed to chair only   Further, I certify that my clinical findings support that this patient is homebound due to:  Shortness of Breath with activity   Reason for Medically Necessary Home Health Services:  Skilled Nursing- Change/Decline in Patient Status   Increase activity slowly  As directed        Medication List    STOP taking these medications       doxycycline 100 MG tablet  Commonly known as:  VIBRA-TABS     naproxen sodium 220 MG tablet  Commonly known as:  ANAPROX      TAKE these medications       acetaminophen 325 MG tablet  Commonly known as:  TYLENOL  Take 650 mg by mouth every 6 (six) hours as needed for pain.     allopurinol 100 MG tablet  Commonly known as:  ZYLOPRIM  Take 100 mg by mouth daily.     dabigatran 75 MG Caps capsule  Commonly known as:  PRADAXA  Take 75 mg by mouth every 12 (twelve) hours.     digoxin 0.125 MG tablet  Commonly known as:  LANOXIN  Take 0.125 mg by mouth every other day.     diltiazem 240 MG 24 hr capsule  Commonly known as:  CARDIZEM CD  Take 1 capsule (240 mg total) by mouth daily.     docusate sodium 100 MG capsule  Commonly known as:  COLACE  Take 1 capsule (100 mg total) by mouth 2 (two) times daily as needed for constipation.     furosemide 40 MG tablet  Commonly known as:  LASIX  Take 1 tablet (40 mg total) by mouth 2 (two) times daily.     HYDROcodone-acetaminophen 5-325 MG per tablet  Commonly known as:  NORCO/VICODIN  Take 1-2 tablets by mouth every 6 (six) hours as needed.     insulin aspart protamine- aspart (70-30) 100  UNIT/ML injection  Commonly known as:  NOVOLOG MIX 70/30  Inject 0.18 mLs (18 Units total) into the skin 2 (two) times daily with a meal.     metoprolol 100 MG tablet  Commonly known as:  LOPRESSOR  Take 50 mg by mouth 2 (two) times daily.     polyethylene glycol packet  Commonly known as:  MIRALAX / GLYCOLAX  Take 17 g by mouth daily as needed. For constipation     potassium chloride 10 MEQ tablet  Commonly known as:  K-DUR,KLOR-CON  Take 10 mEq by mouth 2 (two) times daily.     silver sulfADIAZINE 1 % cream  Commonly known as:  SILVADENE  Apply topically daily.     sulfamethoxazole-trimethoprim 800-160 MG per tablet  Commonly known as:  BACTRIM DS  Take 1 tablet by  mouth 2 (two) times daily.       No Known Allergies     Follow-up Information   Follow up with [name], MD In 1 week.   Specialty:  Internal Medicine   Contact information:   [address] [place] [address] [phone]        The results of significant diagnostics from this hospitalization (including imaging, microbiology, ancillary and laboratory) are listed below for reference.    Significant Diagnostic Studies: Dg Tibia/fibula Left  01/12/2013   CLINICAL DATA:  Open wound. Pain and swelling.  EXAM: LEFT TIBIA AND FIBULA - 2 VIEW  COMPARISON:  None.  FINDINGS: No acute bony findings. The knee and ankle joints are maintained. There is diffuse subcutaneous soft tissue swelling/ edema.  IMPRESSION: No acute bony findings.   Electronically Signed   By: Loralie Champagne M.D.   On: 01/12/2013 20:17    Microbiology: No results found for this or any previous visit (from the past 240 hour(s)).   Labs: Basic Metabolic Panel:  Recent Labs Lab 01/12/13 1940 01/13/13 0400 01/14/13 0845  NA 142 142 142  K 3.5 3.1* 3.8  CL 103 102 103  CO2 26 31 28   GLUCOSE 113* 85 66*  BUN 34* 32* 31*  CREATININE 1.82* 1.74* 1.72*  CALCIUM 9.0 9.0 9.0   Liver Function Tests: No results  found for this basename: AST, ALT, ALKPHOS, BILITOT, PROT, ALBUMIN,  in the last 168 hours No results found for this basename: LIPASE, AMYLASE,  in the last 168 hours No results found for this basename: AMMONIA,  in the last 168 hours CBC:  Recent Labs Lab 01/12/13 1940 01/13/13 0400  WBC 10.0 9.2  NEUTROABS 6.2  --   HGB 12.2* 11.5*  HCT 37.2* 34.7*  MCV 95.6 95.6  PLT 159 156   Cardiac Enzymes: No results found for this basename: CKTOTAL, CKMB, CKMBINDEX, TROPONINI,  in the last 168 hours BNP: BNP (last 3 results)  Recent Labs  03/31/12 2120 04/13/12 1852  PROBNP 8718.0* 6402.0*   CBG:  Recent Labs Lab 01/13/13 2148 01/14/13 0750 01/14/13 0842 01/14/13 0927 01/14/13 1204  GLUCAP 91 40* 66* 116* 118*       Signed:  Cynara Tatham A  [facility] 01/14/2013, 2:03 PM

## 2013-01-14 NOTE — Progress Notes (Addendum)
Hypoglycemic Event  CBG: 40  Treatment: 15 GM carbohydrate snack  Symptoms: None  Follow-up CBG: [id number] CBG Result:66  Possible Reasons for Event: Medication regimen: too much 70/30??  Comments/MD notified:Dr Elmahi. Additional snack given, cbg recheck at 0927 was 116    Lyndsi Altic, Heywood Iles  Remember to initiate Hypoglycemia Order Set & complete

## 2013-01-14 NOTE — Progress Notes (Signed)
Patient discharge teaching given, including activity, diet, follow-up appoints, and medications. Patient verbalized understanding of all discharge instructions. IV access was d/c'd. Vitals are stable. Skin is intact except as charted in most recent assessments. Pt to be escorted out by NT, to be driven home by family and caregiver.

## 2013-01-14 NOTE — Progress Notes (Signed)
PT Cancellation Note  Patient Details Name: Vincent [name] MRN: [id number] DOB: June 18, 1935   Cancelled Treatment:    Reason Eval/Treat Not Completed: Other (comment) Pt currently being d/c with HHPT to follow up per nursing.   Ruthann Cancer 01/14/2013, 3:42 PM  Ruthann Cancer, PT, DPT Acute Rehabilitation Services

## 2013-01-14 NOTE — Evaluation (Signed)
Occupational Therapy Evaluation & Treatment Note Patient Details Name: Vincent [name] MRN: [id number] DOB: 1936/01/27 Today's Date: 01/14/2013 Time: 1410-1500 OT Time Calculation (min): 50 min  OT Assessment / Plan / Recommendation History of present illness HPI: [name] is a 77 y.o. male who presents to the ED with a venous stasis ulcer on his LLE ankle.  This appears as a large ruptured bullae that has worsened over the past week despite treatment with antibiotics and outpatient wound care.  The ulcer is painless but he attributes this to his history of diabetic neuropathy.  Patient presented to the ED as instructed since his ulcer has been getting no better.    Clinical Impression   Pt referred for OT eval pending orders to d/c home. Pt sitting in recliner and able to perform UB/LB ADLs w/set-up assist only for UB and min A for LB 2/2 catheter. Pt appears to be at [facility] and has necessary DME (rollator, elevated toilet set, tub bench) & supervision/support from hired assist 5 days/wk and family. No OT needs identified and no follow up recommended as pt at PLOF.     OT Assessment  Patient does not need any further OT services    Follow Up Recommendations  No OT follow up;Supervision/Assistance - 24 hour    Barriers to Discharge      Equipment Recommendations  None recommended by OT    Recommendations for Other Services    Frequency       Precautions / Restrictions Restrictions Weight Bearing Restrictions: No   Pertinent Vitals/Pain No reported pain pre/post tx.    ADL  Eating/Feeding: Independent Grooming: Independent;Other (comment) (seated) Where Assessed - Grooming: Unsupported sitting Upper Body Bathing: Simulated;Independent Where Assessed - Upper Body Bathing: Unsupported sitting Lower Body Bathing: Simulated;Minimal assistance Where Assessed - Lower Body Bathing: Unsupported sitting;Supported sit to stand Upper Body Dressing: Independent Where Assessed - Upper  Body Dressing: Unsupported sitting Lower Body Dressing: Minimal assistance (due to catheter) Where Assessed - Lower Body Dressing: Unsupported sitting Toilet Transfer: Supervision/safety Toilet Transfer Method: Stand pivot Toileting - Clothing Manipulation and Hygiene: Supervision/safety ADL Comments: pt appears to be at [name]    OT Diagnosis:    OT Problem List:   OT Treatment Interventions:     OT Goals(Current goals can be found in the care plan section) Acute Rehab OT Goals Patient Stated Goal: go home OT Goal Formulation: With patient  Visit Information  Last OT Received On: 01/14/13       Prior Functioning     Home Living Family/patient expects to be discharged to:: Private residence Living Arrangements: Spouse/significant other Available Help at Discharge: Family;Other (Comment) (hired caregiver 5 days/wk 8am-2pm) Type of Home: House Home Access: Ramped entrance Home Layout: One level Home Equipment: Walker - 4 wheels;Other (comment);Tub bench;Wheelchair - manual (rollator w/seat) Prior Function Level of Independence: Independent with assistive device(s) Communication Communication: No difficulties         Vision/Perception     Cognition  Cognition Arousal/Alertness: Awake/alert Behavior During Therapy: WFL for tasks assessed/performed Overall Cognitive Status: Within Functional Limits for tasks assessed    Extremity/Trunk Assessment Upper Extremity Assessment Upper Extremity Assessment: Overall WFL for tasks assessed Lower Extremity Assessment Lower Extremity Assessment: Defer to PT evaluation     Mobility Transfers Transfers: Sit to Stand;Stand to Sit Sit to Stand: 5: Supervision Stand to Sit: 5: Supervision     Exercise     Balance     End of Session OT - End  of Session Equipment Utilized During Treatment: Rolling walker Activity Tolerance: Patient tolerated treatment well Patient left: in chair;with call bell/phone within reach  GO      Christin Moline, Deidre Ala 01/14/2013, 3:02 PM

## 2013-01-21 ENCOUNTER — Ambulatory Visit (INDEPENDENT_AMBULATORY_CARE_PROVIDER_SITE_OTHER): Payer: Medicare Other | Admitting: Internal Medicine

## 2013-01-21 ENCOUNTER — Encounter: Payer: Self-pay | Admitting: *Deleted

## 2013-01-21 ENCOUNTER — Ambulatory Visit: Payer: Medicare Other | Admitting: Internal Medicine

## 2013-01-21 ENCOUNTER — Other Ambulatory Visit: Payer: Self-pay | Admitting: Cardiovascular Disease

## 2013-01-21 ENCOUNTER — Encounter: Payer: Self-pay | Admitting: Internal Medicine

## 2013-01-21 VITALS — BP 122/70 | HR 95 | Ht 69.5 in | Wt 272.9 lb

## 2013-01-21 DIAGNOSIS — Z79899 Other long term (current) drug therapy: Secondary | ICD-10-CM

## 2013-01-21 DIAGNOSIS — E119 Type 2 diabetes mellitus without complications: Secondary | ICD-10-CM

## 2013-01-21 DIAGNOSIS — I509 Heart failure, unspecified: Secondary | ICD-10-CM

## 2013-01-21 DIAGNOSIS — I5032 Chronic diastolic (congestive) heart failure: Secondary | ICD-10-CM

## 2013-01-21 DIAGNOSIS — I878 Other specified disorders of veins: Secondary | ICD-10-CM

## 2013-01-21 DIAGNOSIS — I5033 Acute on chronic diastolic (congestive) heart failure: Secondary | ICD-10-CM

## 2013-01-21 DIAGNOSIS — I10 Essential (primary) hypertension: Secondary | ICD-10-CM

## 2013-01-21 DIAGNOSIS — I495 Sick sinus syndrome: Secondary | ICD-10-CM

## 2013-01-21 DIAGNOSIS — I4891 Unspecified atrial fibrillation: Secondary | ICD-10-CM

## 2013-01-21 DIAGNOSIS — I872 Venous insufficiency (chronic) (peripheral): Secondary | ICD-10-CM

## 2013-01-21 DIAGNOSIS — L97309 Non-pressure chronic ulcer of unspecified ankle with unspecified severity: Secondary | ICD-10-CM

## 2013-01-21 DIAGNOSIS — R0602 Shortness of breath: Secondary | ICD-10-CM

## 2013-01-21 DIAGNOSIS — I498 Other specified cardiac arrhythmias: Secondary | ICD-10-CM

## 2013-01-21 DIAGNOSIS — Z950 Presence of cardiac pacemaker: Secondary | ICD-10-CM

## 2013-01-21 DIAGNOSIS — I83023 Varicose veins of left lower extremity with ulcer of ankle: Secondary | ICD-10-CM

## 2013-01-21 LAB — PACEMAKER DEVICE OBSERVATION
AL AMPLITUDE: 3.2 mv
BRDY-0002RV: 60 {beats}/min
DEVICE MODEL PM: 111946
RV LEAD AMPLITUDE: 16.6 mv
RV LEAD IMPEDENCE PM: 551 Ohm

## 2013-01-21 MED ORDER — METOLAZONE 5 MG PO TABS: 5.0000 mg | ORAL_TABLET | Freq: Every day | ORAL | Status: DC

## 2013-01-21 NOTE — Patient Instructions (Addendum)
Your physician has recommended you make the following change in your medication: Take furosemide 80mg  twice daily.  Take zaroxolyn 5mg  one daily.  Please have blood work done on Monday.  Please schedule a follow up visit with Dr. Rennis Golden on Tuesday 10/14

## 2013-01-21 NOTE — Progress Notes (Signed)
OFFICE NOTE  Chief Complaint:  Hospital follow-up  Primary Care Physician: [name], MD  HPI:  Vincent [name] is a 77 year old gentleman who has been seen since his recent hospitalization a couple of times, first by our nurse practitioner, made some adjustments to his heart failure medicines and then recently by Dr. Royann Shivers for ongoing issues with atrial fibrillation and rapid ventricular response. He had a pacemaker placed in February 2013 and we have had pretty good success treating his tachyarrhythmias. He does have a history of mild nonischemic cardiomyopathy, EF about 45%, and chronic combined systolic and diastolic heart failure with insulin-dependent diabetes. Most of his symptoms are attributable to right heart failure. It is difficult to tell whether he is more symptomatic in A-fib rather than in sinus, but it has been difficult to establish sinus. He underwent atrial burst pacing last time in the office without success in trying to reestablish sinus. We have discussed the possibility of antiarrhythmic therapy; however, it does seem that the risks or toxicities of antiarrhythmics such as amiodarone outweigh the benefits. His dry weight was about 230 pounds, when I saw him a few months ago (08/2012).  He was recently admitted to the hospital for lower leg cellulitis and a "venous ulcer" on his left leg.  I was not notified of his admission, but noted he was on the inpatient roster.  I arranged follow-up in our office..  today he is noted to be markedly volume overloaded with a weight of 272 pounds, which is 42 pounds over his weight in May. The described ulcer on his left leg is actually more likely a fluid blister which ruptured secondary to significant edema. This has improved with lower extremity wound care which she is receiving at home. Despite his recent admission to the hospital for antibiotics, there was no attempt at diuresis or recognition the fact that he was in decompensated  heart failure.  He is now complaining of worsening abdominal girth, lower extremities swelling, orthopnea, PND and other associated heart failure symptoms.  PMHx:  Past Medical History  Diagnosis Date  . Hypertension   . CHF (congestive heart failure)     EF 40-45% by Echo, combined Systolic & Diastolic  . Irregular heartbeat   . Shortness of breath   . GERD (gastroesophageal reflux disease)   . Diastolic dysfunction, left ventricle 12/18/2011  . Atrial fibrillation with rapid ventricular response, history of PAF was in SR in 08/2011 12/18/2011  . Fecal impaction 04/13/2012  . Left atrial thrombus 12/2011    By TEE 12/2011  . CKD (chronic kidney disease) stage 3, GFR 30-59 ml/min 12/26/2011  . Pneumonia 03/31/2012; 04/13/2012    Healthcare-associated pneumonia/notes 04/13/2012  . Prostate cancer   . PAF (paroxysmal atrial fibrillation) 04/15/2012    TEE in 12/2011 with LAA thrombus - no DCCV   . Type II diabetes mellitus   . Arthritis     SHOULDERS  . Symptomatic bradycardia 05/29/2012  . Tachycardia-bradycardia syndrome 05/29/2012  . S/P cardiac pacemaker procedure, New, [employer] 05/28/12 05/29/2012  . Nonischemic cardiomyopathy     h/o     Past Surgical History  Procedure Laterality Date  . Prostate surgery    . Tee without cardioversion  12/27/2011    Procedure: TRANSESOPHAGEAL ECHOCARDIOGRAM (TEE);  Surgeon: Chrystie Nose, MD;  Location: [facility] OR;  Service: Cardiovascular;  Laterality: N/A;  MD request Main OR - EF 40-45%, mild conc LVH, systolic function mild-mod reduced; LA mod-severely dialted with uniobular  appendage  . Cardioversion  12/27/2011    Procedure: CARDIOVERSION;  Surgeon: Chrystie Nose, MD;  Location: [facility] OR;  Service: Cardiovascular;  Laterality: N/A;  . Coronary angioplasty with stent placement    . Cardioversion N/A 05/27/2012    Procedure: CARDIOVERSION;  Surgeon: Chrystie Nose, MD;  Location: [facility] OR;  Service: Cardiovascular;  Laterality: N/A;  .  Pacemaker insertion  05/2011    [facility] [employer] Advantio, Model [id number], Serial [phone]  . Cardiac catheterization  05/28/2007    patent coronaries, EF 50%, mod pulm arterial htn (Dr. Elsie Lincoln)    FAMHx:  Family History  Problem Relation Age of Onset  . Diabetes Mellitus II Mother   . Sudden death Mother   . Prostate cancer Father     SOCHx:   reports that he quit smoking about 32 years ago. He has never used smokeless tobacco. He reports that he does not drink alcohol or use illicit drugs.  ALLERGIES:  No Known Allergies  ROS: A comprehensive review of systems was negative except for: Constitutional: positive for fatigue and weight gain Respiratory: positive for dyspnea on exertion Cardiovascular: positive for dyspnea, orthopnea and paroxysmal nocturnal dyspnea Gastrointestinal: positive for abdominal fullness Integument/breast: positive for skin color change and skin lesion(s)  HOME MEDS: Current Outpatient Prescriptions  Medication Sig Dispense Refill  . acetaminophen (TYLENOL) 325 MG tablet Take 650 mg by mouth every 6 (six) hours as needed for pain.      Marland Kitchen allopurinol (ZYLOPRIM) 100 MG tablet Take 100 mg by mouth daily.      . dabigatran (PRADAXA) 75 MG CAPS Take 75 mg by mouth every 12 (twelve) hours.      . digoxin (LANOXIN) 0.125 MG tablet Take 0.125 mg by mouth every other day.      . diltiazem (CARDIZEM CD) 240 MG 24 hr capsule Take 1 capsule (240 mg total) by mouth daily.  30 capsule  11  . docusate sodium (COLACE) 100 MG capsule Take 1 capsule (100 mg total) by mouth 2 (two) times daily as needed for constipation.  30 capsule  0  . furosemide (LASIX) 40 MG tablet Take 1 tablet (40 mg total) by mouth 2 (two) times daily.  60 tablet  0  . HYDROcodone-acetaminophen (NORCO/VICODIN) 5-325 MG per tablet Take 1-2 tablets by mouth every 6 (six) hours as needed.  20 tablet  0  . insulin aspart protamine- aspart (NOVOLOG MIX 70/30) (70-30) 100 UNIT/ML injection Inject into the  skin 2 (two) times daily with a meal. SSI in AM, 20U in PM      . metoprolol (LOPRESSOR) 100 MG tablet Take 50 mg by mouth 2 (two) times daily.      . polyethylene glycol (MIRALAX / GLYCOLAX) packet Take 17 g by mouth daily as needed. For constipation      . potassium chloride (K-DUR,KLOR-CON) 10 MEQ tablet Take 10 mEq by mouth 2 (two) times daily.      Marland Kitchen sulfamethoxazole-trimethoprim (BACTRIM DS) 800-160 MG per tablet Take 1 tablet by mouth 2 (two) times daily.  10 tablet  0  . metolazone (ZAROXOLYN) 5 MG tablet Take 1 tablet (5 mg total) by mouth daily.  30 tablet  6   No current facility-administered medications for this visit.    LABS/IMAGING: No results found for this or any previous visit (from the past 48 hour(s)). No results found.  VITALS: BP 122/70  Pulse 95  Ht 5' 9.5" (1.765 m)  Wt 272 lb 14.4 oz (123.787  kg)  BMI 39.74 kg/m2  EXAM: General appearance: alert and no distress Neck: no carotid bruit and no JVD Lungs: clear to auscultation bilaterally Heart: irregular rate and rhythm, S1, S2 normal, no murmur, click, rub or gallop Abdomen: protuberant, tense, unable to elicit fluid wave Extremities: 3-4+ edema, chronic stasis changes, open blisters Pulses: 2+ and symmetric Skin: Skin color, texture, turgor normal. No rashes or lesions Neurologic: Grossly normal Psych: Pleasant, cooperative  EKG: Atrial fibrillation at 95  ASSESSMENT: 1. Acute on chronic combined systolic and diastolic heart failure, congestive with more right heart symptoms, EF 45%, and NYHA class IV symptoms 2. Significant lower shimmied edema with ruptured blisters 3. Chronic atrial fibrillation-stable by device interrogation today with DDI programming 4. No abnormalities noted in his pacemaker 5. CKD III  PLAN: 1.   Mr. Salguero is in decompensated congestive heart failure. He appears actually quite ill and somewhat short of breath with exertion. He actually recently left the hospital in heart  failure and we were never consult in for evaluation of his 30-40 pound weight gain. The lower shimmied edema secondary to viable overload as t the "blisters" on his legs due to weeping edema, and only recently he had his Lasix dose increased somewhat. He does have chronic kidney disease Stage 3, therefore it may be difficult to diurese him. I have recommended doubling his Lasix to 80 mg twice daily and adding Zaroxolyn 5 mg daily in the morning. Hopefully this will increase his diuresis, but I am doubtful that we'll be able to diuresis him adequately as an outpatient. I will plan to recheck a metabolic profile on Monday as well as a BNP. If his renal function is worsening or there is not significant improvement in his weight change (which I asked him to measure daily and bring to the office), then he will likely need to be admitted for IV diuresis.  Plan to see him back in the office on Tuesday next week.  I've instructed him if his shortness of breath gets worse prior to then to present back to the emergency room for admission.  Chrystie Nose, MD, [facility] Attending Cardiologist [facility]  Jaine Estabrooks C 01/21/2013, 6:24 PM

## 2013-01-22 ENCOUNTER — Telehealth: Payer: Self-pay | Admitting: Oncology

## 2013-01-22 ENCOUNTER — Ambulatory Visit (HOSPITAL_BASED_OUTPATIENT_CLINIC_OR_DEPARTMENT_OTHER): Payer: Medicare Other | Admitting: Oncology

## 2013-01-22 ENCOUNTER — Encounter (INDEPENDENT_AMBULATORY_CARE_PROVIDER_SITE_OTHER): Payer: Self-pay

## 2013-01-22 ENCOUNTER — Other Ambulatory Visit (HOSPITAL_BASED_OUTPATIENT_CLINIC_OR_DEPARTMENT_OTHER): Payer: Medicare Other

## 2013-01-22 ENCOUNTER — Telehealth: Payer: Self-pay | Admitting: Internal Medicine

## 2013-01-22 ENCOUNTER — Encounter: Payer: Self-pay | Admitting: Internal Medicine

## 2013-01-22 VITALS — BP 139/61 | HR 98 | Temp 97.8°F | Resp 20 | Ht 69.5 in | Wt 271.5 lb

## 2013-01-22 DIAGNOSIS — C61 Malignant neoplasm of prostate: Secondary | ICD-10-CM

## 2013-01-22 DIAGNOSIS — I509 Heart failure, unspecified: Secondary | ICD-10-CM

## 2013-01-22 LAB — PSA: PSA: 56.85 ng/mL — ABNORMAL HIGH (ref <=4.00)

## 2013-01-22 LAB — CBC WITH DIFFERENTIAL/PLATELET
Basophils Absolute: 0.1 10*3/uL (ref 0.0–0.1)
EOS%: 7.6 % — ABNORMAL HIGH (ref 0.0–7.0)
HGB: 11.1 g/dL — ABNORMAL LOW (ref 13.0–17.1)
LYMPH%: 16.5 % (ref 14.0–49.0)
MCH: 30.7 pg (ref 27.2–33.4)
MCV: 95.6 fL (ref 79.3–98.0)
MONO%: 7.3 % (ref 0.0–14.0)
Platelets: 158 10*3/uL (ref 140–400)
RBC: 3.62 10*6/uL — ABNORMAL LOW (ref 4.20–5.82)
RDW: 16.6 % — ABNORMAL HIGH (ref 11.0–14.6)
lymph#: 1.2 10*3/uL (ref 0.9–3.3)

## 2013-01-22 LAB — COMPREHENSIVE METABOLIC PANEL (CC13)
AST: 44 U/L — ABNORMAL HIGH (ref 5–34)
Albumin: 3.1 g/dL — ABNORMAL LOW (ref 3.5–5.0)
Alkaline Phosphatase: 127 U/L (ref 40–150)
Anion Gap: 9 mEq/L (ref 3–11)
BUN: 34.3 mg/dL — ABNORMAL HIGH (ref 7.0–26.0)
CO2: 29 mEq/L (ref 22–29)
Calcium: 9.2 mg/dL (ref 8.4–10.4)
Creatinine: 2.5 mg/dL — ABNORMAL HIGH (ref 0.7–1.3)
Glucose: 120 mg/dl (ref 70–140)
Potassium: 3.9 mEq/L (ref 3.5–5.1)
Sodium: 143 mEq/L (ref 136–145)
Total Bilirubin: 0.61 mg/dL (ref 0.20–1.20)

## 2013-01-22 NOTE — Progress Notes (Signed)
Hematology and Oncology Follow Up Visit  Vincent [name] [id number] [birth year] 77 y.o. 01/22/2013 1:41 PM [name], [name], Lindell Spar, MD   Principle Diagnosis: 77 year old gentleman with advanced prostate cancer he had a Gleason score 4+4 equals 8 and a PSA of 9 diagnosed in 2003. He had at T1c. tumor He has currently advanced localized disease with a pelvic mass and lymphadenopathy.  Prior Therapy:  He is status post external beam radiation with the brachytherapy completed in 2003. That was done at [facility]. He is status post androgen depravation and subsequently second line hormonal manipulation with ketoconazole and hydrocortisone. Patient was lost to followup and for a while have had followup at [facility] as well as [facility] and most recently established care at the [facility].  Current therapy: Androgen deprivation in the form of Lupron last time given in July 2014 40 PSA 118 and a testosterone level of 150.  Interim History:  Mr. Pietsch presents today for a followup visit. He is a pleasant gentleman with the above history. I saw him initially in September 2014 and his PSA at that time had declined to 79. Since his last visit, he was hospitalized for cellulitis due to bilateral lower extremity edema. He continues to have struggles with congestive heart failure and fluid overload. He is chair bound for the most part and reports pain in his lower extremity as well as occasional exertional dyspnea. He does not have any bone pain to report at this time.  Medications: I have reviewed the patient's current medications.  Current Outpatient Prescriptions  Medication Sig Dispense Refill  . acetaminophen (TYLENOL) 325 MG tablet Take 650 mg by mouth every 6 (six) hours as needed for pain.      Marland Kitchen allopurinol (ZYLOPRIM) 100 MG tablet Take 100 mg by mouth daily.      . dabigatran (PRADAXA) 75 MG CAPS Take 75 mg by mouth  every 12 (twelve) hours.      . digoxin (LANOXIN) 0.125 MG tablet Take 0.125 mg by mouth every other day.      . diltiazem (CARDIZEM CD) 240 MG 24 hr capsule Take 1 capsule (240 mg total) by mouth daily.  30 capsule  11  . docusate sodium (COLACE) 100 MG capsule Take 1 capsule (100 mg total) by mouth 2 (two) times daily as needed for constipation.  30 capsule  0  . HYDROcodone-acetaminophen (NORCO/VICODIN) 5-325 MG per tablet Take 1-2 tablets by mouth every 6 (six) hours as needed.  20 tablet  0  . metolazone (ZAROXOLYN) 5 MG tablet Take 1 tablet (5 mg total) by mouth daily.  30 tablet  6  . metoprolol (LOPRESSOR) 100 MG tablet Take 50 mg by mouth 2 (two) times daily.      . polyethylene glycol (MIRALAX / GLYCOLAX) packet Take 17 g by mouth daily as needed. For constipation      . potassium chloride (K-DUR,KLOR-CON) 10 MEQ tablet Take 10 mEq by mouth 2 (two) times daily.      Marland Kitchen sulfamethoxazole-trimethoprim (BACTRIM DS) 800-160 MG per tablet Take 1 tablet by mouth 2 (two) times daily.  10 tablet  0   No current facility-administered medications for this visit.     Allergies: No Known Allergies  Past Medical History, Surgical history, Social history, and Family History were reviewed and updated.  Review of Systems:  Remaining ROS negative. Physical Exam: Blood pressure 139/61, pulse 98, temperature 97.8 F (36.6 C), temperature  source Oral, resp. rate 20, height 5' 9.5" (1.765 m), weight 271 lb 8 oz (123.152 kg). ECOG:  General appearance: alert, cooperative and appears stated age Head: Normocephalic, without obvious abnormality, atraumatic Neck: no adenopathy, no carotid bruit, no JVD, supple, symmetrical, trachea midline and thyroid not enlarged, symmetric, no tenderness/mass/nodules Lymph nodes: Cervical, supraclavicular, and axillary nodes normal. Heart:irregularly irregular rhythm Lung:chest clear, no wheezing, rales, normal symmetric air entry Abdomin: soft, non-tender, without  masses or organomegaly ZOX:WRUEA noted.    Lab Results: Lab Results  Component Value Date   WBC 7.3 01/22/2013   HGB 11.1* 01/22/2013   HCT 34.6* 01/22/2013   MCV 95.6 01/22/2013   PLT 158 01/22/2013     Chemistry      Component Value Date/Time   NA 142 01/14/2013 0845   NA 143 12/15/2012 1049   K 3.8 01/14/2013 0845   K 3.8 12/15/2012 1049   CL 103 01/14/2013 0845   CO2 28 01/14/2013 0845   CO2 24 12/15/2012 1049   BUN 31* 01/14/2013 0845   BUN 27.7* 12/15/2012 1049   CREATININE 1.72* 01/14/2013 0845   CREATININE 1.8* 12/15/2012 1049      Component Value Date/Time   CALCIUM 9.0 01/14/2013 0845   CALCIUM 9.7 12/15/2012 1049   ALKPHOS 94 12/15/2012 1049   ALKPHOS 98 05/26/2012 1551   AST 26 12/15/2012 1049   AST 25 05/26/2012 1551   ALT 16 12/15/2012 1049   ALT 14 05/26/2012 1551   BILITOT 0.94 12/15/2012 1049   BILITOT 0.9 05/26/2012 1551      Results for CHRSTOPHER, MALENFANT (MRN [id number]) as of 01/22/2013 13:22  Ref. Range 12/15/2012 10:49  PSA Latest Range: <=4.00 ng/mL 56.69 (H)   Results for TRAVANTE, KNEE (MRN [id number]) as of 01/22/2013 13:22  Ref. Range 12/15/2012 10:49  Testosterone Latest Range: 300-890 ng/dL 60 (L)     Impression and Plan:  77 year old gentleman with the following issues:  1. Prostate cancer diagnosed in 2003 presented with a Gleason score 4+4 equals 8 and a PSA of 9. He currently has advanced disease with with pelvic mass and adenopathy. He is currently on androgen deprivation and appears to be responding without any clear cut evidence of castration resistance. For the time being I will continue androgen depravation with Lupron and he will be due in January of 2015. Should he develop a rise in his PSA despite castrate levels testosterone and we can discuss other options of treatment.  2. Congestive heart failure he is following up with cardiology regarding that and might require another hospitalization for IV diuresis.  3. Followup will be in 3  months.    Eli Hose, MD 10/10/20141:41 PM

## 2013-01-22 NOTE — Telephone Encounter (Signed)
Hilda Lias called to find out what labs Dr. Rennis Golden needs on patient. States that she can draw them for [place]. Informed her per Dr. Blanchie Dessert office note from 01/21/13 that he wants a BNP and a CMET. Hilda Lias will draw on Monday.

## 2013-01-22 NOTE — Telephone Encounter (Signed)
She is at the pt house now-says she will draw his lab-just need you to approve it.

## 2013-01-22 NOTE — Telephone Encounter (Signed)
Gave pt appt for lab and Md for January 2015 °

## 2013-01-25 ENCOUNTER — Telehealth: Payer: Self-pay | Admitting: Internal Medicine

## 2013-01-25 ENCOUNTER — Encounter: Payer: Self-pay | Admitting: Internal Medicine

## 2013-01-25 NOTE — Telephone Encounter (Signed)
Forward to Dr Rennis Golden and Eileen Stanford RN

## 2013-01-25 NOTE — Telephone Encounter (Signed)
PATIENT HAS AN APPOINTMENT ON 01/26/13 WITH Dr Rennis Golden

## 2013-01-25 NOTE — Telephone Encounter (Signed)
[facility]incent [name] with [facility] wanting to make sure results of bnp [id number] have been seen by dr Vincent [name] they have been faxed over

## 2013-01-26 ENCOUNTER — Encounter: Payer: Self-pay | Admitting: Internal Medicine

## 2013-01-26 ENCOUNTER — Inpatient Hospital Stay (HOSPITAL_COMMUNITY)
Admission: AD | Admit: 2013-01-26 | Discharge: 2013-02-02 | DRG: 292 | Disposition: A | Discharge status: Home / Self Care | Payer: Medicare Other | Source: Ambulatory Visit | Attending: Internal Medicine | Admitting: Internal Medicine

## 2013-01-26 ENCOUNTER — Ambulatory Visit (INDEPENDENT_AMBULATORY_CARE_PROVIDER_SITE_OTHER): Payer: Medicare Other | Admitting: Internal Medicine

## 2013-01-26 ENCOUNTER — Telehealth: Payer: Self-pay | Admitting: *Deleted

## 2013-01-26 ENCOUNTER — Encounter (HOSPITAL_COMMUNITY): Payer: Self-pay | Admitting: Cardiology

## 2013-01-26 ENCOUNTER — Inpatient Hospital Stay (HOSPITAL_COMMUNITY): Payer: Medicare Other

## 2013-01-26 VITALS — BP 136/62 | HR 80 | Ht 69.5 in | Wt 261.8 lb

## 2013-01-26 DIAGNOSIS — I422 Other hypertrophic cardiomyopathy: Secondary | ICD-10-CM | POA: Diagnosis present

## 2013-01-26 DIAGNOSIS — I509 Heart failure, unspecified: Secondary | ICD-10-CM

## 2013-01-26 DIAGNOSIS — G4733 Obstructive sleep apnea (adult) (pediatric): Secondary | ICD-10-CM | POA: Diagnosis present

## 2013-01-26 DIAGNOSIS — Z87891 Personal history of nicotine dependence: Secondary | ICD-10-CM

## 2013-01-26 DIAGNOSIS — I5043 Acute on chronic combined systolic (congestive) and diastolic (congestive) heart failure: Principal | ICD-10-CM | POA: Diagnosis present

## 2013-01-26 DIAGNOSIS — Z7901 Long term (current) use of anticoagulants: Secondary | ICD-10-CM

## 2013-01-26 DIAGNOSIS — I5033 Acute on chronic diastolic (congestive) heart failure: Secondary | ICD-10-CM

## 2013-01-26 DIAGNOSIS — E119 Type 2 diabetes mellitus without complications: Secondary | ICD-10-CM | POA: Diagnosis present

## 2013-01-26 DIAGNOSIS — I83023 Varicose veins of left lower extremity with ulcer of ankle: Secondary | ICD-10-CM

## 2013-01-26 DIAGNOSIS — I428 Other cardiomyopathies: Secondary | ICD-10-CM | POA: Diagnosis present

## 2013-01-26 DIAGNOSIS — I129 Hypertensive chronic kidney disease with stage 1 through stage 4 chronic kidney disease, or unspecified chronic kidney disease: Secondary | ICD-10-CM | POA: Diagnosis present

## 2013-01-26 DIAGNOSIS — I83003 Varicose veins of unspecified lower extremity with ulcer of ankle: Secondary | ICD-10-CM | POA: Diagnosis present

## 2013-01-26 DIAGNOSIS — Z8546 Personal history of malignant neoplasm of prostate: Secondary | ICD-10-CM | POA: Diagnosis present

## 2013-01-26 DIAGNOSIS — Z9861 Coronary angioplasty status: Secondary | ICD-10-CM

## 2013-01-26 DIAGNOSIS — I10 Essential (primary) hypertension: Secondary | ICD-10-CM | POA: Diagnosis present

## 2013-01-26 DIAGNOSIS — IMO0002 Reserved for concepts with insufficient information to code with codable children: Secondary | ICD-10-CM | POA: Diagnosis present

## 2013-01-26 DIAGNOSIS — I4891 Unspecified atrial fibrillation: Secondary | ICD-10-CM | POA: Diagnosis present

## 2013-01-26 DIAGNOSIS — N183 Chronic kidney disease, stage 3 unspecified: Secondary | ICD-10-CM | POA: Diagnosis present

## 2013-01-26 DIAGNOSIS — I872 Venous insufficiency (chronic) (peripheral): Secondary | ICD-10-CM | POA: Diagnosis present

## 2013-01-26 DIAGNOSIS — M545 Low back pain, unspecified: Secondary | ICD-10-CM | POA: Diagnosis present

## 2013-01-26 DIAGNOSIS — I5189 Other ill-defined heart diseases: Secondary | ICD-10-CM | POA: Diagnosis present

## 2013-01-26 DIAGNOSIS — C61 Malignant neoplasm of prostate: Secondary | ICD-10-CM | POA: Diagnosis present

## 2013-01-26 DIAGNOSIS — N189 Chronic kidney disease, unspecified: Secondary | ICD-10-CM

## 2013-01-26 DIAGNOSIS — R319 Hematuria, unspecified: Secondary | ICD-10-CM | POA: Diagnosis present

## 2013-01-26 DIAGNOSIS — L97309 Non-pressure chronic ulcer of unspecified ankle with unspecified severity: Secondary | ICD-10-CM | POA: Diagnosis present

## 2013-01-26 DIAGNOSIS — Z950 Presence of cardiac pacemaker: Secondary | ICD-10-CM

## 2013-01-26 DIAGNOSIS — I513 Intracardiac thrombosis, not elsewhere classified: Secondary | ICD-10-CM | POA: Diagnosis present

## 2013-01-26 DIAGNOSIS — R0602 Shortness of breath: Secondary | ICD-10-CM | POA: Insufficient documentation

## 2013-01-26 DIAGNOSIS — Z794 Long term (current) use of insulin: Secondary | ICD-10-CM | POA: Diagnosis present

## 2013-01-26 DIAGNOSIS — I495 Sick sinus syndrome: Secondary | ICD-10-CM | POA: Diagnosis present

## 2013-01-26 DIAGNOSIS — K219 Gastro-esophageal reflux disease without esophagitis: Secondary | ICD-10-CM | POA: Diagnosis present

## 2013-01-26 DIAGNOSIS — Z79899 Other long term (current) drug therapy: Secondary | ICD-10-CM

## 2013-01-26 DIAGNOSIS — I480 Paroxysmal atrial fibrillation: Secondary | ICD-10-CM | POA: Diagnosis present

## 2013-01-26 DIAGNOSIS — R531 Weakness: Secondary | ICD-10-CM

## 2013-01-26 DIAGNOSIS — N289 Disorder of kidney and ureter, unspecified: Secondary | ICD-10-CM | POA: Diagnosis present

## 2013-01-26 DIAGNOSIS — IMO0001 Reserved for inherently not codable concepts without codable children: Secondary | ICD-10-CM

## 2013-01-26 DIAGNOSIS — E876 Hypokalemia: Secondary | ICD-10-CM | POA: Diagnosis present

## 2013-01-26 DIAGNOSIS — I471 Supraventricular tachycardia: Secondary | ICD-10-CM

## 2013-01-26 DIAGNOSIS — E039 Hypothyroidism, unspecified: Secondary | ICD-10-CM | POA: Diagnosis present

## 2013-01-26 DIAGNOSIS — M129 Arthropathy, unspecified: Secondary | ICD-10-CM | POA: Diagnosis present

## 2013-01-26 DIAGNOSIS — I5032 Chronic diastolic (congestive) heart failure: Secondary | ICD-10-CM | POA: Diagnosis present

## 2013-01-26 HISTORY — DX: Hypothyroidism, unspecified: E03.9

## 2013-01-26 HISTORY — DX: Gout, unspecified: M10.9

## 2013-01-26 HISTORY — DX: Presence of cardiac pacemaker: Z95.0

## 2013-01-26 HISTORY — DX: Orthopnea: R06.01

## 2013-01-26 LAB — COMPREHENSIVE METABOLIC PANEL
AST: 37 U/L (ref 0–37)
Albumin: 3.5 g/dL (ref 3.5–5.2)
BUN: 47 mg/dL — ABNORMAL HIGH (ref 6–23)
CO2: 28 mEq/L (ref 19–32)
Calcium: 9.6 mg/dL (ref 8.4–10.5)
Chloride: 98 mEq/L (ref 96–112)
Creatinine, Ser: 2.75 mg/dL — ABNORMAL HIGH (ref 0.50–1.35)
GFR calc Af Amer: 24 mL/min — ABNORMAL LOW (ref >90)
GFR calc non Af Amer: 21 mL/min — ABNORMAL LOW (ref >90)
Glucose, Bld: 120 mg/dL — ABNORMAL HIGH (ref 70–99)
Sodium: 140 mEq/L (ref 135–145)
Total Bilirubin: 0.5 mg/dL (ref 0.3–1.2)
Total Protein: 7.6 g/dL (ref 6.0–8.3)

## 2013-01-26 LAB — APTT: aPTT: 55 seconds — ABNORMAL HIGH (ref 24–37)

## 2013-01-26 LAB — CBC WITH DIFFERENTIAL/PLATELET
Basophils Relative: 0 % (ref 0–1)
HCT: 37.3 % — ABNORMAL LOW (ref 39.0–52.0)
Hemoglobin: 12.6 g/dL — ABNORMAL LOW (ref 13.0–17.0)
MCHC: 33.8 g/dL (ref 30.0–36.0)
Monocytes Absolute: 0.7 10*3/uL (ref 0.1–1.0)
Monocytes Relative: 7 % (ref 3–12)
Neutro Abs: 6.9 10*3/uL (ref 1.7–7.7)
Neutrophils Relative %: 71 % (ref 43–77)

## 2013-01-26 LAB — PRO B NATRIURETIC PEPTIDE: Pro B Natriuretic peptide (BNP): 5230 pg/mL — ABNORMAL HIGH (ref 0–450)

## 2013-01-26 LAB — PROTIME-INR
INR: 1.72 — ABNORMAL HIGH (ref 0.00–1.49)
Prothrombin Time: 19.7 seconds — ABNORMAL HIGH (ref 11.6–15.2)

## 2013-01-26 MED ORDER — SODIUM CHLORIDE 0.9 % IV SOLN
250.0000 mL | INTRAVENOUS | Status: DC | PRN
  Administered 2013-01-29: 09:00:00 250 mL via INTRAVENOUS

## 2013-01-26 MED ORDER — DOCUSATE SODIUM 100 MG PO CAPS
100.0000 mg | ORAL_CAPSULE | Freq: Two times a day (BID) | ORAL | Status: DC | PRN
  Administered 2013-01-26 – 2013-01-29 (×2): 100 mg via ORAL
  Filled 2013-01-26 (×2): qty 1

## 2013-01-26 MED ORDER — ZOLPIDEM TARTRATE 5 MG PO TABS: 5.0000 mg | ORAL_TABLET | Freq: Every evening | ORAL | Status: DC | PRN

## 2013-01-26 MED ORDER — ACETAMINOPHEN 325 MG PO TABS: 650.0000 mg | ORAL_TABLET | Freq: Four times a day (QID) | ORAL | Status: DC | PRN

## 2013-01-26 MED ORDER — INSULIN ASPART PROT & ASPART (70-30 MIX) 100 UNIT/ML ~~LOC~~ SUSP
18.0000 [IU] | Freq: Two times a day (BID) | SUBCUTANEOUS | Status: DC
  Administered 2013-01-26 – 2013-02-02 (×15): 18 [IU] via SUBCUTANEOUS
  Filled 2013-01-26 (×2): qty 10

## 2013-01-26 MED ORDER — DIGOXIN 125 MCG PO TABS
0.1250 mg | ORAL_TABLET | ORAL | Status: DC
  Administered 2013-01-28 – 2013-02-01 (×3): 0.125 mg via ORAL
  Filled 2013-01-26 (×3): qty 1

## 2013-01-26 MED ORDER — ASPIRIN EC 81 MG PO TBEC
81.0000 mg | DELAYED_RELEASE_TABLET | Freq: Every day | ORAL | Status: DC
  Administered 2013-01-26 – 2013-02-02 (×8): 81 mg via ORAL
  Filled 2013-01-26 (×8): qty 1

## 2013-01-26 MED ORDER — ALLOPURINOL 100 MG PO TABS
100.0000 mg | ORAL_TABLET | Freq: Every day | ORAL | Status: DC
  Administered 2013-01-27 – 2013-02-02 (×7): 100 mg via ORAL
  Filled 2013-01-26 (×7): qty 1

## 2013-01-26 MED ORDER — ALPRAZOLAM 0.25 MG PO TABS: 0.2500 mg | ORAL_TABLET | Freq: Two times a day (BID) | ORAL | Status: DC | PRN

## 2013-01-26 MED ORDER — DILTIAZEM HCL ER COATED BEADS 240 MG PO CP24
240.0000 mg | ORAL_CAPSULE | Freq: Every day | ORAL | Status: DC
  Administered 2013-01-27 – 2013-02-02 (×7): 240 mg via ORAL
  Filled 2013-01-26 (×7): qty 1

## 2013-01-26 MED ORDER — ONDANSETRON HCL 4 MG/2ML IJ SOLN: 4.0000 mg | Freq: Four times a day (QID) | INTRAMUSCULAR | Status: DC | PRN

## 2013-01-26 MED ORDER — METOPROLOL TARTRATE 50 MG PO TABS
50.0000 mg | ORAL_TABLET | Freq: Two times a day (BID) | ORAL | Status: DC
  Administered 2013-01-26 – 2013-02-02 (×14): 50 mg via ORAL
  Filled 2013-01-26 (×15): qty 1

## 2013-01-26 MED ORDER — INSULIN ASPART 100 UNIT/ML ~~LOC~~ SOLN
0.0000 [IU] | Freq: Three times a day (TID) | SUBCUTANEOUS | Status: DC
  Administered 2013-01-26 – 2013-01-27 (×2): 1 [IU] via SUBCUTANEOUS
  Administered 2013-01-28: 3 [IU] via SUBCUTANEOUS
  Administered 2013-01-29 (×2): 2 [IU] via SUBCUTANEOUS
  Administered 2013-01-30: 06:00:00 1 [IU] via SUBCUTANEOUS
  Administered 2013-01-30 – 2013-01-31 (×2): 2 [IU] via SUBCUTANEOUS
  Administered 2013-01-31: 1 [IU] via SUBCUTANEOUS
  Administered 2013-01-31: 06:00:00 2 [IU] via SUBCUTANEOUS
  Administered 2013-02-01: 5 [IU] via SUBCUTANEOUS
  Administered 2013-02-01 – 2013-02-02 (×3): 2 [IU] via SUBCUTANEOUS
  Administered 2013-02-02 (×2): 1 [IU] via SUBCUTANEOUS

## 2013-01-26 MED ORDER — SODIUM CHLORIDE 0.9 % IJ SOLN: 3.0000 mL | INTRAMUSCULAR | Status: DC | PRN

## 2013-01-26 MED ORDER — DABIGATRAN ETEXILATE MESYLATE 75 MG PO CAPS
75.0000 mg | ORAL_CAPSULE | Freq: Two times a day (BID) | ORAL | Status: DC
  Administered 2013-01-26 – 2013-02-02 (×14): 75 mg via ORAL
  Filled 2013-01-26 (×15): qty 1

## 2013-01-26 MED ORDER — POTASSIUM CHLORIDE CRYS ER 10 MEQ PO TBCR
10.0000 meq | EXTENDED_RELEASE_TABLET | Freq: Two times a day (BID) | ORAL | Status: DC
  Administered 2013-01-26 – 2013-01-31 (×10): 10 meq via ORAL
  Filled 2013-01-26 (×11): qty 1

## 2013-01-26 MED ORDER — ACETAMINOPHEN 325 MG PO TABS
650.0000 mg | ORAL_TABLET | ORAL | Status: DC | PRN
  Administered 2013-01-29 – 2013-02-01 (×3): 650 mg via ORAL
  Filled 2013-01-26 (×3): qty 2

## 2013-01-26 MED ORDER — SODIUM CHLORIDE 0.9 % IJ SOLN
3.0000 mL | Freq: Two times a day (BID) | INTRAMUSCULAR | Status: DC
  Administered 2013-01-26 – 2013-01-31 (×7): 3 mL via INTRAVENOUS

## 2013-01-26 MED ORDER — POLYETHYLENE GLYCOL 3350 17 G PO PACK
17.0000 g | PACK | Freq: Every day | ORAL | Status: DC | PRN
  Administered 2013-01-27: 08:00:00 17 g via ORAL
  Filled 2013-01-26: qty 1

## 2013-01-26 MED ORDER — FUROSEMIDE 10 MG/ML IJ SOLN
80.0000 mg | Freq: Two times a day (BID) | INTRAMUSCULAR | Status: DC
  Administered 2013-01-26 – 2013-02-01 (×12): 80 mg via INTRAVENOUS
  Filled 2013-01-26 (×15): qty 8

## 2013-01-26 MED ORDER — POTASSIUM CHLORIDE CRYS ER 20 MEQ PO TBCR
40.0000 meq | EXTENDED_RELEASE_TABLET | Freq: Once | ORAL | Status: AC
  Administered 2013-01-26: 40 meq via ORAL
  Filled 2013-01-26: qty 2

## 2013-01-26 MED ORDER — INSULIN ASPART PROT & ASPART (70-30 MIX) 100 UNIT/ML ~~LOC~~ SUSP: 18.0000 [IU] | Freq: Every day | SUBCUTANEOUS | Status: DC

## 2013-01-26 MED ORDER — HYDROCODONE-ACETAMINOPHEN 5-325 MG PO TABS
1.0000 | ORAL_TABLET | Freq: Four times a day (QID) | ORAL | Status: DC | PRN
  Administered 2013-01-26: 1 via ORAL
  Administered 2013-01-27 – 2013-01-29 (×2): 2 via ORAL
  Administered 2013-01-29 – 2013-01-31 (×3): 1 via ORAL
  Filled 2013-01-26 (×2): qty 1
  Filled 2013-01-26: qty 2
  Filled 2013-01-26 (×2): qty 1
  Filled 2013-01-26: qty 2

## 2013-01-26 NOTE — Consult Note (Signed)
WOC wound consult note Reason for Consult: Consult requested for BLE.  Pt has been wearing compression wraps at home to control edema and home health has been changing 2X week. Familiar to [facility] team from previous admission, refer to progress note on 10/1.  Daughter at bedside to assess wound which she states has greatly improved during the past week, and legs without edema or erythemia at this time. Wound type: Left leg with healing full thickness stasis ulcer Measurement:7.5X6X.1cm Wound bed:100% pink and moist Drainage (amount, consistency, odor) No odor, small amt pink drainage Periwound: intact skin surrounding Dressing procedure/placement/frequency: Foam dressing applied and ortho tech called to apply bilat Una boots and Coban to continue to control edema. Maintain current schedule of 2X week dressing changes and home health assistance. Please re-consult if further assistance is needed.  Thank-you,  Cammie Mcgee MSN, RN, CWOCN, [place], CNS [phone]

## 2013-01-26 NOTE — Progress Notes (Signed)
Pt arrived to 4 E.  Stable no acute distress.  Discussed with daughter 1. Cellulitis no off antibiotics will have wound care see and eval. 2.was started on of synthroid recently but has only taken 3 doses, will recheck TSH and add synthroid depending on TSH, has never been on synthroid until recently.  Dr. Chestine Spore PCP added. 3. He has back pain after a fall, lumbar sacral will check back films.   Both legs are wrapped -wound care will assist with treatment. Labs and xrays pending.

## 2013-01-26 NOTE — Progress Notes (Signed)
Utilization Review Completed.   Leiam Hopwood, RN, BSN Nurse Case Manager  [phone]  

## 2013-01-26 NOTE — Patient Instructions (Signed)
Please go to the admitting department at [facility]. You will have a bed on 4E.

## 2013-01-26 NOTE — Telephone Encounter (Signed)
Faxed venipuncture order to [facility] 01/26/13

## 2013-01-26 NOTE — Progress Notes (Signed)
Notified [name], PA that patient has arrived to room 4E14. Patient oriented to room and equipment, vitals signs stable. Family present with patient. No complaints of pain or discomfort at this time. Will continue to monitor to end of shift.

## 2013-01-26 NOTE — Progress Notes (Signed)
Orthopedic Tech Progress Note Patient Details:  Vincent [name] 11-23-1935 [id number]  Ortho Devices Type of Ortho Device: Radio broadcast assistant Ortho Device/Splint Location: (B) LE Ortho Device/Splint Interventions: Ordered;Application   Jennye Moccasin 01/26/2013, 3:46 PM

## 2013-01-26 NOTE — Progress Notes (Signed)
  ADMISSION HISTORY & PHYSICAL   Chief Complaint:  Dyspnea, weight gain  Cardiologist: Hilty  Primary Care Physician: CLARK,PRESTON S, MD  HPI:  Vincent [name] is a 77-year-old gentleman who has been seen since his recent hospitalization a couple of times, first by our nurse practitioner, made some adjustments to his heart failure medicines and then recently by Dr. Croitoru for ongoing issues with atrial fibrillation and rapid ventricular response. He had a pacemaker placed in February 2013 and we have had pretty good success treating his tachyarrhythmias. He does have a history of mild nonischemic cardiomyopathy, EF about 45%, and chronic combined systolic and diastolic heart failure with insulin-dependent diabetes. Most of his symptoms are attributable to right heart failure. It is difficult to tell whether he is more symptomatic in A-fib rather than in sinus, but it has been difficult to establish sinus. He underwent atrial burst pacing last time in the office without success in trying to reestablish sinus. We have discussed the possibility of antiarrhythmic therapy; however, it does seem that the risks or toxicities of antiarrhythmics such as amiodarone outweigh the benefits. His dry weight was about 230 pounds, when I saw him a few months ago (08/2012).  He was recently admitted to the hospital for lower leg cellulitis and a "venous ulcer" on his left leg.  I was not notified of his admission, but noted he was on the inpatient roster.  I arranged follow-up in our office. I saw him in the office last week on 01/21/2013.  He was noted to be markedly volume overloaded with a weight of 272 pounds, which is 52 pounds over his weight in May. The described ulcer on his left leg is actually more likely a fluid blister which ruptured secondary to significant edema. This has improved with lower extremity wound care which she is receiving at home. Despite his recent admission to the hospital for antibiotics,  there was no attempt at diuresis or recognition the fact that he was in decompensated heart failure. He is now complaining of worsening abdominal girth, lower extremities swelling, orthopnea, PND and other associated heart failure symptoms. At his last visit, I recommended doubling his Lasix to 80 mg twice daily and adding Zaroxolyn 5 mg daily in the morning. This is actually been associated with a significant diuresis, he is now down 10 pounds since his last visit. He reports some improvement in his shortness of breath, but still has significant swelling, increased abdominal girth and lower extremity edema. Again, approximately his dry weight to be between 220 and 230 pounds, however may be lower. Laboratory work was obtained yesterday which shows a creatinine has jumped to 3.06, up from 2.10 at his last hospitalization approximally 4 days ago. His BNP is elevated at 4216.  I'm concerned based on his worsening renal function to the continue to diuresis him as an outpatient without the need for possibly low dose milrinone or adjunct to therapy to help protect his renal function.  His daughter is also concerned that he had a fall the other week and continued to have pain in the area of his coccyx.  PMHx:  Past Medical History  Diagnosis Date  . Hypertension   . CHF (congestive heart failure)     EF 40-45% by Echo, combined Systolic & Diastolic  . Irregular heartbeat   . Shortness of breath   . GERD (gastroesophageal reflux disease)   . Diastolic dysfunction, left ventricle 12/18/2011  . Atrial fibrillation with rapid ventricular response, history   of PAF was in SR in 08/2011 12/18/2011  . Fecal impaction 04/13/2012  . Left atrial thrombus 12/2011    By TEE 12/2011  . CKD (chronic kidney disease) stage 3, GFR 30-59 ml/min 12/26/2011  . Pneumonia 03/31/2012; 04/13/2012    Healthcare-associated pneumonia/notes 04/13/2012  . Prostate cancer   . PAF (paroxysmal atrial fibrillation) 04/15/2012    TEE in 12/2011  with LAA thrombus - no DCCV   . Type II diabetes mellitus   . Arthritis     SHOULDERS  . Symptomatic bradycardia 05/29/2012  . Tachycardia-bradycardia syndrome 05/29/2012  . S/P cardiac pacemaker procedure, New, [employer] 05/28/12 05/29/2012  . Nonischemic cardiomyopathy     h/o     Past Surgical History  Procedure Laterality Date  . Prostate surgery    . Tee without cardioversion  12/27/2011    Procedure: TRANSESOPHAGEAL ECHOCARDIOGRAM (TEE);  Surgeon: Kenneth C. Hilty, MD;  Location: [facility] OR;  Service: Cardiovascular;  Laterality: N/A;  MD request Main OR - EF 40-45%, mild conc LVH, systolic function mild-mod reduced; LA mod-severely dialted with uniobular appendage  . Cardioversion  12/27/2011    Procedure: CARDIOVERSION;  Surgeon: Kenneth C. Hilty, MD;  Location: [facility] OR;  Service: Cardiovascular;  Laterality: N/A;  . Coronary angioplasty with stent placement    . Cardioversion N/A 05/27/2012    Procedure: CARDIOVERSION;  Surgeon: Kenneth C. Hilty, MD;  Location: [facility] OR;  Service: Cardiovascular;  Laterality: N/A;  . Pacemaker insertion  05/2011    [employer] Advantio, Model #K064, Serial #[id number]  . Cardiac catheterization  05/28/2007    patent coronaries, EF 50%, mod pulm arterial htn (Dr. Gamble)    FAMHx:  Family History  Problem Relation Age of Onset  . Diabetes Mellitus II Mother   . Sudden death Mother   . Prostate cancer Father     SOCHx:   reports that he quit smoking about 32 years ago. He has never used smokeless tobacco. He reports that he does not drink alcohol or use illicit drugs.  ALLERGIES:  No Known Allergies  ROS: A comprehensive review of systems was negative except for: Constitutional: positive for fatigue and weight loss Respiratory: positive for dyspnea on exertion Cardiovascular: positive for fatigue, lower extremity edema and orthopnea Integument/breast: positive for skin lesion(s) Musculoskeletal: positive for pain in the tailbone  HOME  MEDS: No current facility-administered medications on file prior to encounter.   Current Outpatient Prescriptions on File Prior to Encounter  Medication Sig Dispense Refill  . acetaminophen (TYLENOL) 325 MG tablet Take 650 mg by mouth every 6 (six) hours as needed for pain.      . allopurinol (ZYLOPRIM) 100 MG tablet Take 100 mg by mouth daily.      . dabigatran (PRADAXA) 75 MG CAPS Take 75 mg by mouth every 12 (twelve) hours.      . digoxin (LANOXIN) 0.125 MG tablet Take 0.125 mg by mouth every other day.      . diltiazem (CARDIZEM CD) 240 MG 24 hr capsule Take 1 capsule (240 mg total) by mouth daily.  30 capsule  11  . docusate sodium (COLACE) 100 MG capsule Take 1 capsule (100 mg total) by mouth 2 (two) times daily as needed for constipation.  30 capsule  0  . furosemide (LASIX) 80 MG tablet Take 80 mg by mouth 2 (two) times daily.      . HYDROcodone-acetaminophen (NORCO/VICODIN) 5-325 MG per tablet Take 1-2 tablets by mouth every 6 (six) hours as needed.    20 tablet  0  . insulin aspart protamine- aspart (NOVOLOG MIX 70/30) (70-30) 100 UNIT/ML injection Inject 18 Units into the skin daily with breakfast. 20 units in pm with sliding scale      . metolazone (ZAROXOLYN) 5 MG tablet Take 1 tablet (5 mg total) by mouth daily.  30 tablet  6  . metoprolol (LOPRESSOR) 100 MG tablet Take 50 mg by mouth 2 (two) times daily.      . polyethylene glycol (MIRALAX / GLYCOLAX) packet Take 17 g by mouth daily as needed. For constipation      . potassium chloride (K-DUR,KLOR-CON) 10 MEQ tablet Take 10 mEq by mouth 2 (two) times daily.      . sulfamethoxazole-trimethoprim (BACTRIM DS) 800-160 MG per tablet Take 1 tablet by mouth 2 (two) times daily.  10 tablet  0   LABS/IMAGING: No results found for this or any previous visit (from the past 48 hour(s)). No results found.  VITALS: P80, BP 136/62, Height 5'9", Weight 261 lbs, BMI 38.12  EXAM: General appearance: alert and no distress HEENT: no adenopathy,  JVP elevated to 3 cmH20, thick neck, eyes mildly proptotic PULM: clear to auscultation bilaterally, decreased in bases CV: regular rate and rhythm, S1, S2 normal, no murmur, click, rub or gallop, pulses difficult to palpate GI: morbidly obese, protuberant, tense abdomen, no fluid wave, non-tender MUSCULOSKELETAL: pain on palpation over the coccyceal area, 3-4+ tense LE bilateral edema Skin: Skin notable for bilateral LE wraps, weeping edema, healing venous stasis ulcer Neurologic: Grossly non-focal Psych: Pleasant, normal mood  IMPRESSION: Active Problems:   Hypertension   Acute on chronic diastolic CHF (congestive heart failure)   DM2 (diabetes mellitus, type 2)   CKD (chronic kidney disease) stage 3, GFR 30-59 ml/min   PAF (paroxysmal atrial fibrillation)   Chronic anticoagulation   Obstructive sleep apnea, C-pap intol   Venous stasis ulcer of ankle   PLAN: 1. Mr. Campas has subacute on chronic combined systolic and diastolic heart failure, EF 45%, with mostly right heart failure symptoms. He does have chronic kidney disease and has had worsening creatinine with an attempt at outpatient diuresis. He has diuresed about 10 pounds over the past 5 days, but this is at the expense of worsening creatinine. I think that he needs to be monitored closely for diuresis as he has about 40-50 more pounds to diurese.  I think we could more be gently diuresis and with IV Lasix and possibly milrinone if his renal function gets worse. Outpatient laboratory work today revealed a creatinine of 3.06 and a BNP of 4216.  Again, his dry weight is thought to be about 220 lbs.  Kenneth C. Hilty, MD, FACC Attending Cardiologist [facility]  HILTY,Kenneth C 01/26/2013, 11:23 AM     

## 2013-01-26 NOTE — Progress Notes (Signed)
K+ level low will replace.  CXR ?PNA, doubt- no fever, no WBC elevation.  Will add incentive spirometer.  Back films without acute process.

## 2013-01-26 NOTE — H&P (Signed)
ADMISSION HISTORY & PHYSICAL   Chief Complaint:  Dyspnea, weight gain  Cardiologist: Breyonna Nault  Primary Care Physician: [name], MD  HPI:  Vincent [name] is a 77 year old gentleman who has been seen since his recent hospitalization a couple of times, first by our nurse practitioner, made some adjustments to his heart failure medicines and then recently by Dr. Royann Shivers for ongoing issues with atrial fibrillation and rapid ventricular response. He had a pacemaker placed in February 2013 and we have had pretty good success treating his tachyarrhythmias. He does have a history of mild nonischemic cardiomyopathy, EF about 45%, and chronic combined systolic and diastolic heart failure with insulin-dependent diabetes. Most of his symptoms are attributable to right heart failure. It is difficult to tell whether he is more symptomatic in A-fib rather than in sinus, but it has been difficult to establish sinus. He underwent atrial burst pacing last time in the office without success in trying to reestablish sinus. We have discussed the possibility of antiarrhythmic therapy; however, it does seem that the risks or toxicities of antiarrhythmics such as amiodarone outweigh the benefits. His dry weight was about 230 pounds, when I saw him a few months ago (08/2012).  He was recently admitted to the hospital for lower leg cellulitis and a "venous ulcer" on his left leg.  I was not notified of his admission, but noted he was on the inpatient roster.  I arranged follow-up in our office. I saw him in the office last week on 01/21/2013.  He was noted to be markedly volume overloaded with a weight of 272 pounds, which is 52 pounds over his weight in May. The described ulcer on his left leg is actually more likely a fluid blister which ruptured secondary to significant edema. This has improved with lower extremity wound care which she is receiving at home. Despite his recent admission to the hospital for antibiotics,  there was no attempt at diuresis or recognition the fact that he was in decompensated heart failure. He is now complaining of worsening abdominal girth, lower extremities swelling, orthopnea, PND and other associated heart failure symptoms. At his last visit, I recommended doubling his Lasix to 80 mg twice daily and adding Zaroxolyn 5 mg daily in the morning. This is actually been associated with a significant diuresis, he is now down 10 pounds since his last visit. He reports some improvement in his shortness of breath, but still has significant swelling, increased abdominal girth and lower extremity edema. Again, approximately his dry weight to be between 220 and 230 pounds, however may be lower. Laboratory work was obtained yesterday which shows a creatinine has jumped to 3.06, up from 2.10 at his last hospitalization approximally 4 days ago. His BNP is elevated at 4216.  I'm concerned based on his worsening renal function to the continue to diuresis him as an outpatient without the need for possibly low dose milrinone or adjunct to therapy to help protect his renal function.  His daughter is also concerned that he had a fall the other week and continued to have pain in the area of his coccyx.  PMHx:  Past Medical History  Diagnosis Date  . Hypertension   . CHF (congestive heart failure)     EF 40-45% by Echo, combined Systolic & Diastolic  . Irregular heartbeat   . Shortness of breath   . GERD (gastroesophageal reflux disease)   . Diastolic dysfunction, left ventricle 12/18/2011  . Atrial fibrillation with rapid ventricular response, history  of PAF was in SR in 08/2011 12/18/2011  . Fecal impaction 04/13/2012  . Left atrial thrombus 12/2011    By TEE 12/2011  . CKD (chronic kidney disease) stage 3, GFR 30-59 ml/min 12/26/2011  . Pneumonia 03/31/2012; 04/13/2012    Healthcare-associated pneumonia/notes 04/13/2012  . Prostate cancer   . PAF (paroxysmal atrial fibrillation) 04/15/2012    TEE in 12/2011  with LAA thrombus - no DCCV   . Type II diabetes mellitus   . Arthritis     SHOULDERS  . Symptomatic bradycardia 05/29/2012  . Tachycardia-bradycardia syndrome 05/29/2012  . S/P cardiac pacemaker procedure, New, [employer] 05/28/12 05/29/2012  . Nonischemic cardiomyopathy     h/o     Past Surgical History  Procedure Laterality Date  . Prostate surgery    . Tee without cardioversion  12/27/2011    Procedure: TRANSESOPHAGEAL ECHOCARDIOGRAM (TEE);  Surgeon: Chrystie Nose, MD;  Location: [facility] OR;  Service: Cardiovascular;  Laterality: N/A;  MD request Main OR - EF 40-45%, mild conc LVH, systolic function mild-mod reduced; LA mod-severely dialted with uniobular appendage  . Cardioversion  12/27/2011    Procedure: CARDIOVERSION;  Surgeon: Chrystie Nose, MD;  Location: [facility] OR;  Service: Cardiovascular;  Laterality: N/A;  . Coronary angioplasty with stent placement    . Cardioversion N/A 05/27/2012    Procedure: CARDIOVERSION;  Surgeon: Chrystie Nose, MD;  Location: [facility] OR;  Service: Cardiovascular;  Laterality: N/A;  . Pacemaker insertion  05/2011    [facility] [employer] Advantio, Model [id number], Serial [id number]  . Cardiac catheterization  05/28/2007    patent coronaries, EF 50%, mod pulm arterial htn (Dr. Elsie Lincoln)    FAMHx:  Family History  Problem Relation Age of Onset  . Diabetes Mellitus II Mother   . Sudden death Mother   . Prostate cancer Father     SOCHx:   reports that he quit smoking about 32 years ago. He has never used smokeless tobacco. He reports that he does not drink alcohol or use illicit drugs.  ALLERGIES:  No Known Allergies  ROS: A comprehensive review of systems was negative except for: Constitutional: positive for fatigue and weight loss Respiratory: positive for dyspnea on exertion Cardiovascular: positive for fatigue, lower extremity edema and orthopnea Integument/breast: positive for skin lesion(s) Musculoskeletal: positive for pain in the tailbone  HOME  MEDS: No current facility-administered medications on file prior to encounter.   Current Outpatient Prescriptions on File Prior to Encounter  Medication Sig Dispense Refill  . acetaminophen (TYLENOL) 325 MG tablet Take 650 mg by mouth every 6 (six) hours as needed for pain.      Marland Kitchen allopurinol (ZYLOPRIM) 100 MG tablet Take 100 mg by mouth daily.      . dabigatran (PRADAXA) 75 MG CAPS Take 75 mg by mouth every 12 (twelve) hours.      . digoxin (LANOXIN) 0.125 MG tablet Take 0.125 mg by mouth every other day.      . diltiazem (CARDIZEM CD) 240 MG 24 hr capsule Take 1 capsule (240 mg total) by mouth daily.  30 capsule  11  . docusate sodium (COLACE) 100 MG capsule Take 1 capsule (100 mg total) by mouth 2 (two) times daily as needed for constipation.  30 capsule  0  . furosemide (LASIX) 80 MG tablet Take 80 mg by mouth 2 (two) times daily.      Marland Kitchen HYDROcodone-acetaminophen (NORCO/VICODIN) 5-325 MG per tablet Take 1-2 tablets by mouth every 6 (six) hours as needed.  20 tablet  0  . insulin aspart protamine- aspart (NOVOLOG MIX 70/30) (70-30) 100 UNIT/ML injection Inject 18 Units into the skin daily with breakfast. 20 units in pm with sliding scale      . metolazone (ZAROXOLYN) 5 MG tablet Take 1 tablet (5 mg total) by mouth daily.  30 tablet  6  . metoprolol (LOPRESSOR) 100 MG tablet Take 50 mg by mouth 2 (two) times daily.      . polyethylene glycol (MIRALAX / GLYCOLAX) packet Take 17 g by mouth daily as needed. For constipation      . potassium chloride (K-DUR,KLOR-CON) 10 MEQ tablet Take 10 mEq by mouth 2 (two) times daily.      Marland Kitchen sulfamethoxazole-trimethoprim (BACTRIM DS) 800-160 MG per tablet Take 1 tablet by mouth 2 (two) times daily.  10 tablet  0   LABS/IMAGING: No results found for this or any previous visit (from the past 48 hour(s)). No results found.  VITALS: P80, BP 136/62, Height 5'9", Weight 261 lbs, BMI 38.12  EXAM: General appearance: alert and no distress HEENT: no adenopathy,  JVP elevated to 3 cmH20, thick neck, eyes mildly proptotic PULM: clear to auscultation bilaterally, decreased in bases CV: regular rate and rhythm, S1, S2 normal, no murmur, click, rub or gallop, pulses difficult to palpate GI: morbidly obese, protuberant, tense abdomen, no fluid wave, non-tender MUSCULOSKELETAL: pain on palpation over the coccyceal area, 3-4+ tense LE bilateral edema Skin: Skin notable for bilateral LE wraps, weeping edema, healing venous stasis ulcer Neurologic: Grossly non-focal Psych: Pleasant, normal mood  IMPRESSION: Active Problems:   Hypertension   Acute on chronic diastolic CHF (congestive heart failure)   DM2 (diabetes mellitus, type 2)   CKD (chronic kidney disease) stage 3, GFR 30-59 ml/min   PAF (paroxysmal atrial fibrillation)   Chronic anticoagulation   Obstructive sleep apnea, C-pap intol   Venous stasis ulcer of ankle   PLAN: 1. Mr. Drudge has subacute on chronic combined systolic and diastolic heart failure, EF 45%, with mostly right heart failure symptoms. He does have chronic kidney disease and has had worsening creatinine with an attempt at outpatient diuresis. He has diuresed about 10 pounds over the past 5 days, but this is at the expense of worsening creatinine. I think that he needs to be monitored closely for diuresis as he has about 40-50 more pounds to diurese.  I think we could more be gently diuresis and with IV Lasix and possibly milrinone if his renal function gets worse. Outpatient laboratory work today revealed a creatinine of 3.06 and a BNP of 4216.  Again, his dry weight is thought to be about 220 lbs.  Chrystie Nose, MD, [facility] Attending Cardiologist [facility]  Kamisha Ell C 01/26/2013, 11:23 AM

## 2013-01-27 ENCOUNTER — Encounter (HOSPITAL_COMMUNITY): Payer: Self-pay | Admitting: General Practice

## 2013-01-27 DIAGNOSIS — I495 Sick sinus syndrome: Secondary | ICD-10-CM

## 2013-01-27 DIAGNOSIS — Z950 Presence of cardiac pacemaker: Secondary | ICD-10-CM

## 2013-01-27 DIAGNOSIS — N183 Chronic kidney disease, stage 3 unspecified: Secondary | ICD-10-CM

## 2013-01-27 DIAGNOSIS — I4891 Unspecified atrial fibrillation: Secondary | ICD-10-CM

## 2013-01-27 LAB — BASIC METABOLIC PANEL
BUN: 51 mg/dL — ABNORMAL HIGH (ref 6–23)
CO2: 31 mEq/L (ref 19–32)
Calcium: 9.5 mg/dL (ref 8.4–10.5)
Chloride: 99 mEq/L (ref 96–112)
Glucose, Bld: 138 mg/dL — ABNORMAL HIGH (ref 70–99)
Potassium: 3.7 mEq/L (ref 3.5–5.1)
Sodium: 140 mEq/L (ref 135–145)

## 2013-01-27 LAB — PRO B NATRIURETIC PEPTIDE: Pro B Natriuretic peptide (BNP): 4713 pg/mL — ABNORMAL HIGH (ref 0–450)

## 2013-01-27 LAB — GLUCOSE, CAPILLARY: Glucose-Capillary: 90 mg/dL (ref 70–99)

## 2013-01-27 MED ORDER — MILRINONE IN DEXTROSE 20 MG/100ML IV SOLN
0.2500 ug/kg/min | INTRAVENOUS | Status: DC
  Administered 2013-01-27 – 2013-02-01 (×11): 0.25 ug/kg/min via INTRAVENOUS
  Filled 2013-01-27 (×13): qty 100

## 2013-01-27 NOTE — Progress Notes (Signed)
Patient was evaluated by [facility] on previous admission in September.  He refused services at that time.  Will continue to monitor this admission and will discuss the need for engagement with the inpatient team.  Of note, [facility] Care Management services does not replace or interfere with any services that are arranged by inpatient case management or social work.  For additional questions or referrals please contact Anibal Henderson BSN RN [facility] Liaison at [phone].

## 2013-01-27 NOTE — Progress Notes (Addendum)
[facility]  Subjective: Feeling better. No complaints.   Objective: Vital signs in last 24 hours: Temp:  [97.7 F (36.5 C)-98.2 F (36.8 C)] 98.2 F (36.8 C) (10/15 0446) Pulse Rate:  [60-80] 75 (10/15 0446) Resp:  [18-19] 19 (10/15 0446) BP: (124-136)/(62-75) 124/70 mmHg (10/15 0446) SpO2:  [99 %-100 %] 99 % (10/15 0446) Weight:  [256 lb 12.8 oz (116.484 kg)-261 lb 12.8 oz (118.752 kg)] 256 lb 12.8 oz (116.484 kg) (10/15 0446) Last BM Date: 01/23/13  Intake/Output from previous day: 10/14 0701 - 10/15 0700 In: 220 [P.O.:220] Out: 1250 [Urine:1250] Intake/Output this shift:    Medications Current Facility-Administered Medications  Medication Dose Route Frequency Provider Last Rate Last Dose  . 0.9 %  sodium chloride infusion  250 mL Intravenous PRN [name], NP      . acetaminophen (TYLENOL) tablet 650 mg  650 mg Oral Q4H PRN [name], NP      . allopurinol (ZYLOPRIM) tablet 100 mg  100 mg Oral Daily [name], NP      . ALPRAZolam Prudy Feeler) tablet 0.25 mg  0.25 mg Oral BID PRN [name], NP      . aspirin EC tablet 81 mg  81 mg Oral Daily [name], NP   81 mg at 01/26/13 1855  . dabigatran (PRADAXA) capsule 75 mg  75 mg Oral Q12H [name], NP   75 mg at 01/26/13 2130  . [START ON 01/28/2013] digoxin (LANOXIN) tablet 0.125 mg  0.125 mg Oral QODAY [name], NP      . diltiazem (CARDIZEM CD) 24 hr capsule 240 mg  240 mg Oral Daily [name], NP      . docusate sodium (COLACE) capsule 100 mg  100 mg Oral BID PRN [name], NP   100 mg at 01/26/13 2130  . furosemide (LASIX) injection 80 mg  80 mg Intravenous BID [name], NP   80 mg at 01/27/13 0824  . HYDROcodone-acetaminophen (NORCO/VICODIN) 5-325 MG per tablet 1-2 tablet  1-2 tablet Oral Q6H PRN [name], NP   2 tablet at 01/27/13 0733  . insulin aspart (novoLOG) injection 0-9 Units  0-9 Units Subcutaneous TID WC [name], NP   1 Units at 01/27/13 0701    . insulin aspart protamine- aspart (NOVOLOG MIX 70/30) injection 18 Units  18 Units Subcutaneous BID WC Chrystie Nose, MD   18 Units at 01/27/13 0702  . metoprolol (LOPRESSOR) tablet 50 mg  50 mg Oral BID [name], NP   50 mg at 01/26/13 2130  . ondansetron (ZOFRAN) injection 4 mg  4 mg Intravenous Q6H PRN [name], NP      . polyethylene glycol (MIRALAX / GLYCOLAX) packet 17 g  17 g Oral Daily PRN [name], NP   17 g at 01/27/13 0733  . potassium chloride (K-DUR,KLOR-CON) CR tablet 10 mEq  10 mEq Oral BID [name], NP   10 mEq at 01/26/13 2130  . sodium chloride 0.9 % injection 3 mL  3 mL Intravenous Q12H [name], NP   3 mL at 01/26/13 2130  . sodium chloride 0.9 % injection 3 mL  3 mL Intravenous PRN [name], NP      . zolpidem (AMBIEN) tablet 5 mg  5 mg Oral QHS PRN [name], NP        PE: General appearance: alert, cooperative and no distress Neck: mild JVD Lungs: clear to auscultation bilaterally Heart: regular rate and  rhythm, S1, S2 normal, no murmur, click, rub or gallop Extremities: wraped in ACE bandages Pulses: 2+ and symmetric Skin: warm and dry Neurologic: Grossly normal  Lab Results:   Recent Labs  01/26/13 1550  WBC 9.7  HGB 12.6*  HCT 37.3*  PLT 200   BMET  Recent Labs  01/26/13 1550 01/27/13 0620  NA 140 140  K 3.3* 3.7  CL 98 99  CO2 28 31  GLUCOSE 120* 138*  BUN 47* 51*  CREATININE 2.75* 2.92*  CALCIUM 9.6 9.5   PT/INR  Recent Labs  01/26/13 1550  LABPROT 19.7*  INR 1.72*   Filed Weights   01/26/13 1345 01/27/13 0446  Weight: 258 lb 6.1 oz (117.2 kg) 256 lb 12.8 oz (116.484 kg)   BNP (last 3 results)  Recent Labs  04/13/12 1852 01/26/13 1551 01/27/13 0620  PROBNP 6402.0* 5230.0* 4713.0*    Assessment/Plan  Principal Problem:   Acute on chronic diastolic CHF (congestive heart failure) Active Problems:   Hypertension   DM2 (diabetes mellitus, type 2)   CKD (chronic kidney disease) stage 3,  GFR 30-59 ml/min   PAF (paroxysmal atrial fibrillation)   Chronic anticoagulation   Obstructive sleep apnea, C-pap intol   Tachycardia-bradycardia syndrome, with PPM [employer]    Venous stasis ulcer of ankle   Back pain, lumbosacral, since recent fall   Hypothyroid, recently diagnosed   Plan: Admitted yesterday from clinic for IV diuretic therapy for a/c CHF. He has diuresed 1 L of fluid since admission. Weight is down 2 lbs since yesterday. Dry weight is ~220-230 lb. Weight today is 256. BNP has improved slightly from 5,230 to 4,713. SCr continues to trend closer to 3.0 at 2.92. Will continue with 80 mg IV Lasix BID. ? Adding milrinone. Will defer to MD. K+ is WNL. Continue daily PO supplementation. TSH was checked yesterday, as pt was recently started on Synthroid. TSH is WNL.      LOS: 1 day    Brittainy M. Sharol Harness, PA-C 01/27/2013 8:48 AM  I have seen and examined the patient along with Brittainy M. Sharol Harness, PA-C.  I have reviewed the chart, notes and new data.  I agree with NP's note.  Key new complaints: still orthopneic Key examination changes: JVP to angle of jaw, legs wrapped in elastic bandage, grossly edematous, hepatomegaly, clear lungs; estimated to be roughly 30 lb above dry weight. Currently in AF with RVR Key new findings / data: gradually worsening creatinine with fairly mediocre diuresis - acute on chronic renal insufficiency.  PLAN: Add low dose milrinone. Continue attempts at diuresis. Consider ultrafiltration if that does not work.  Thurmon Fair, MD, [facility] [phone] 01/27/2013, 1:27 PM

## 2013-01-28 DIAGNOSIS — G4733 Obstructive sleep apnea (adult) (pediatric): Secondary | ICD-10-CM

## 2013-01-28 DIAGNOSIS — E119 Type 2 diabetes mellitus without complications: Secondary | ICD-10-CM

## 2013-01-28 DIAGNOSIS — I5189 Other ill-defined heart diseases: Secondary | ICD-10-CM

## 2013-01-28 DIAGNOSIS — I5032 Chronic diastolic (congestive) heart failure: Secondary | ICD-10-CM

## 2013-01-28 LAB — BASIC METABOLIC PANEL
BUN: 51 mg/dL — ABNORMAL HIGH (ref 6–23)
CO2: 31 mEq/L (ref 19–32)
Creatinine, Ser: 2.48 mg/dL — ABNORMAL HIGH (ref 0.50–1.35)
GFR calc Af Amer: 28 mL/min — ABNORMAL LOW (ref >90)
GFR calc non Af Amer: 24 mL/min — ABNORMAL LOW (ref >90)
Glucose, Bld: 118 mg/dL — ABNORMAL HIGH (ref 70–99)
Potassium: 3.4 mEq/L — ABNORMAL LOW (ref 3.5–5.1)

## 2013-01-28 LAB — GLUCOSE, CAPILLARY
Glucose-Capillary: 234 mg/dL — ABNORMAL HIGH (ref 70–99)
Glucose-Capillary: 113 mg/dL — ABNORMAL HIGH (ref 70–99)
Glucose-Capillary: 117 mg/dL — ABNORMAL HIGH (ref 70–99)

## 2013-01-28 NOTE — Progress Notes (Signed)
Patient sat up in recliner chair for most of the day until condom cath came off. Reapplied condom cath and at that time patient wanted to get back into the bed. Patient continues to have milrinone infusing and is tolerating it well with no complaints of discomfort or adverse reaction. Patient complains of no pain at this time and will continue to monitor to end of shift.

## 2013-01-28 NOTE — Progress Notes (Signed)
Subjective: No complaints. He denies SOB.    Objective: Vital signs in last 24 hours: Temp:  [97.4 F (36.3 C)-98.4 F (36.9 C)] 98.4 F (36.9 C) (10/16 0830) Pulse Rate:  [70-76] 74 (10/16 1118) Resp:  [15-20] 15 (10/16 0830) BP: (105-128)/(45-70) 112/70 mmHg (10/16 1118) SpO2:  [96 %-100 %] 98 % (10/16 1118) Weight:  [256 lb (116.121 kg)] 256 lb (116.121 kg) (10/16 0830) Last BM Date: 01/27/13  Intake/Output from previous day: 10/15 0701 - 10/16 0700 In: 1056.4 [P.O.:1022; I.V.:34.4] Out: 3825 [Urine:3825] Intake/Output this shift: Total I/O In: 351 [P.O.:351] Out: -   Medications Current Facility-Administered Medications  Medication Dose Route Frequency Provider Last Rate Last Dose  . 0.9 %  sodium chloride infusion  250 mL Intravenous PRN [name], NP      . acetaminophen (TYLENOL) tablet 650 mg  650 mg Oral Q4H PRN [name], NP      . allopurinol (ZYLOPRIM) tablet 100 mg  100 mg Oral Daily [name], NP   100 mg at 01/28/13 1127  . ALPRAZolam Prudy Feeler) tablet 0.25 mg  0.25 mg Oral BID PRN [name], NP      . aspirin EC tablet 81 mg  81 mg Oral Daily [name], NP   81 mg at 01/28/13 1127  . dabigatran (PRADAXA) capsule 75 mg  75 mg Oral Q12H [name], NP   75 mg at 01/28/13 1129  . digoxin (LANOXIN) tablet 0.125 mg  0.125 mg Oral QODAY [name], NP   0.125 mg at 01/28/13 1127  . diltiazem (CARDIZEM CD) 24 hr capsule 240 mg  240 mg Oral Daily [name], NP   240 mg at 01/28/13 1127  . docusate sodium (COLACE) capsule 100 mg  100 mg Oral BID PRN [name], NP   100 mg at 01/26/13 2130  . furosemide (LASIX) injection 80 mg  80 mg Intravenous BID [name], NP   80 mg at 01/28/13 1126  . HYDROcodone-acetaminophen (NORCO/VICODIN) 5-325 MG per tablet 1-2 tablet  1-2 tablet Oral Q6H PRN [name], NP   2 tablet at 01/27/13 0733  . insulin aspart (novoLOG) injection 0-9 Units  0-9 Units Subcutaneous TID WC [name], NP   1 Units at  01/27/13 0701  . insulin aspart protamine- aspart (NOVOLOG MIX 70/30) injection 18 Units  18 Units Subcutaneous BID WC Chrystie Nose, MD   18 Units at 01/28/13 1125  . metoprolol (LOPRESSOR) tablet 50 mg  50 mg Oral BID [name], NP   50 mg at 01/28/13 1128  . milrinone (PRIMACOR) infusion 200 mcg/mL (0.2 mg/ml)  0.25 mcg/kg/min Intravenous Continuous Mihai Croitoru, MD 8.7 mL/hr at 01/28/13 1122 0.25 mcg/kg/min at 01/28/13 1122  . ondansetron (ZOFRAN) injection 4 mg  4 mg Intravenous Q6H PRN [name], NP      . polyethylene glycol (MIRALAX / GLYCOLAX) packet 17 g  17 g Oral Daily PRN [name], NP   17 g at 01/27/13 0733  . potassium chloride (K-DUR,KLOR-CON) CR tablet 10 mEq  10 mEq Oral BID [name], NP   10 mEq at 01/28/13 1128  . sodium chloride 0.9 % injection 3 mL  3 mL Intravenous Q12H [name], NP   3 mL at 01/27/13 1047  . sodium chloride 0.9 % injection 3 mL  3 mL Intravenous PRN [name], NP      . zolpidem (AMBIEN) tablet 5 mg  5 mg Oral QHS PRN [name], NP  PE: General appearance: alert, cooperative and no distress Lungs: faint rales bibasilar Heart: regular rate and rhythm, S1, S2 normal, no murmur, click, rub or gallop Extremities: no LEE Pulses: 2+ and symmetric Skin: warm and dry Neurologic: Grossly normal  Lab Results:   Recent Labs  01/26/13 1550  WBC 9.7  HGB 12.6*  HCT 37.3*  PLT 200   BMET  Recent Labs  01/26/13 1550 01/27/13 0620 01/28/13 0615  NA 140 140 140  K 3.3* 3.7 3.4*  CL 98 99 98  CO2 28 31 31   GLUCOSE 120* 138* 118*  BUN 47* 51* 51*  CREATININE 2.75* 2.92* 2.48*  CALCIUM 9.6 9.5 9.1   PT/INR  Recent Labs  01/26/13 1550  LABPROT 19.7*  INR 1.72*   BNP (last 3 results)  Recent Labs  04/13/12 1852 01/26/13 1551 01/27/13 0620  PROBNP 6402.0* 5230.0* 4713.0*   Filed Weights   01/26/13 1345 01/27/13 0446 01/28/13 0830  Weight: 258 lb 6.1 oz (117.2 kg) 256 lb 12.8 oz (116.484 kg) 256 lb  (116.121 kg)     Assessment/Plan  Principal Problem:   Acute on chronic diastolic CHF (congestive heart failure) Active Problems:   Hypertension   DM2 (diabetes mellitus, type 2)   CKD (chronic kidney disease) stage 3, GFR 30-59 ml/min   PAF (paroxysmal atrial fibrillation)   Chronic anticoagulation   Obstructive sleep apnea, C-pap intol   Tachycardia-bradycardia syndrome, with PPM [employer]    Venous stasis ulcer of ankle   Back pain, lumbosacral, since recent fall   Hypothyroid, recently diagnosed   Plan: Low dose Milrinone was added yesterday. Pt diuresed 2L yesterday and 3.4 L total since admission. SCr has improved from 2.9 to 2.48. He is still ~16 lb above dry weight. Continue with Milrinone and 80 mg IV lasix. Will continue daily weights and strict I/Os. He is hypokalemic with K+ of 3.4. Supplemental K was given earlier. Will continue to monitor. MD to follow.     LOS: 2 days    Brittainy M. Sharol Harness, PA-C 01/28/2013 11:45 AM  I have seen and evaluated the patient this PM along with Boyce Medici, PA. I agree with her findings, examination as well as impression recommendations.  Brisk UOP (up to 3L out yesterday)  With Milrinone, breathing improved.  Renal function also improved.    Would like to recheck Echo, but would be better when off of Milrinone.  Rate well controlled on CCB & BB & digoxin (will need to check Dig level given A on C RF).; anticoagulated on Pradaxa - stable H/H. On TID Insulin + 70/30.  - CBG a bit up today, but otherwise stable.  Still has a long way to go - ~8L more to diurese.  Continue with Milrinone & IV Lasix.  Follow K levels.  Replete as needed.  MD Time with pt: 20 min  Jacobi Nile W, M.D., M.S. [facility] [address] [place], [place]  [address]  [phone] Pager # [phone] 01/28/2013 2:06 PM

## 2013-01-28 NOTE — Addendum Note (Signed)
Addended by: Chrystie Nose on: 01/28/2013 02:21 PM   Modules accepted: Level of Service

## 2013-01-28 NOTE — Plan of Care (Signed)
Problem: Phase I Progression Outcomes Goal: EF % per last Echo/documented,Core Reminder form on chart Outcome: Completed/Met Date Met:  01/28/13 EF 40-45%(05-27-12)

## 2013-01-29 DIAGNOSIS — I498 Other specified cardiac arrhythmias: Secondary | ICD-10-CM

## 2013-01-29 DIAGNOSIS — I10 Essential (primary) hypertension: Secondary | ICD-10-CM

## 2013-01-29 DIAGNOSIS — L97309 Non-pressure chronic ulcer of unspecified ankle with unspecified severity: Secondary | ICD-10-CM

## 2013-01-29 DIAGNOSIS — Z7901 Long term (current) use of anticoagulants: Secondary | ICD-10-CM

## 2013-01-29 LAB — GLUCOSE, CAPILLARY

## 2013-01-29 LAB — PACEMAKER DEVICE OBSERVATION

## 2013-01-29 LAB — BASIC METABOLIC PANEL
CO2: 30 mEq/L (ref 19–32)
Chloride: 97 mEq/L (ref 96–112)
Potassium: 3.7 mEq/L (ref 3.5–5.1)
Sodium: 139 mEq/L (ref 135–145)

## 2013-01-29 MED ORDER — HYDRALAZINE HCL 25 MG PO TABS
25.0000 mg | ORAL_TABLET | Freq: Three times a day (TID) | ORAL | Status: DC
  Administered 2013-01-29 – 2013-02-02 (×12): 25 mg via ORAL
  Filled 2013-01-29 (×15): qty 1

## 2013-01-29 MED ORDER — ISOSORBIDE DINITRATE 10 MG PO TABS
10.0000 mg | ORAL_TABLET | Freq: Three times a day (TID) | ORAL | Status: DC
  Administered 2013-01-29 – 2013-02-02 (×12): 10 mg via ORAL
  Filled 2013-01-29 (×15): qty 1

## 2013-01-29 NOTE — Consult Note (Signed)
Wound care follow-up: Bilat una boots removed to assess legs and washed them.  Right leg remains without edema or open wound.  Left calf wound 100% pink and moist, mod amt yellow drainage, no odor.  No change in previous measurements. No edema to left leg. Foam dressing applied to left leg wound. Ortho tech paged to apply bilat Una boots and coban and change Tuesday if still in the hospital at that time.  He was previously followed by home health to apply leg wraps twice a week and will need to resume that follow-up after discharge. Please re-consult if further assistance is needed.  Thank-you,  Cammie Mcgee MSN, RN, CWOCN, [place], CNS [phone]

## 2013-01-29 NOTE — Progress Notes (Signed)
Output inaccurate unable to keep condom cath on Pt

## 2013-01-29 NOTE — Progress Notes (Signed)
Orthopedic Tech Progress Note Patient Details:  Vincent [name] 1935-12-26 [id number] Bilateral unna boots applied to LE's. Application tolerated well.  Ortho Devices Type of Ortho Device: Radio broadcast assistant Ortho Device/Splint Location: Bilateral Ortho Device/Splint Interventions: Application   Asia R Thompson 01/29/2013, 10:22 AM

## 2013-01-29 NOTE — Progress Notes (Addendum)
Subjective: No complaints. He denies SOB.   Objective: Vital signs in last 24 hours: Temp:  [97.7 F (36.5 C)-98.5 F (36.9 C)] 97.7 F (36.5 C) (10/17 0413) Pulse Rate:  [72-77] 77 (10/17 0413) Resp:  [18] 18 (10/17 0413) BP: (109-132)/(46-95) 111/95 mmHg (10/17 0413) SpO2:  [98 %-100 %] 100 % (10/17 0413) Weight:  [249 lb 9 oz (113.2 kg)] 249 lb 9 oz (113.2 kg) (10/17 0413) Last BM Date: 01/28/13  Intake/Output from previous day: 10/16 0701 - 10/17 0700 In: 1001 [P.O.:1001] Out: 3700 [Urine:3700] Intake/Output this shift: Total I/O In: 327.1 [I.V.:327.1] Out: -   Medications Current Facility-Administered Medications  Medication Dose Route Frequency Provider Last Rate Last Dose  . 0.9 %  sodium chloride infusion  250 mL Intravenous PRN [name], NP 10 mL/hr at 01/29/13 0836 250 mL at 01/29/13 0836  . acetaminophen (TYLENOL) tablet 650 mg  650 mg Oral Q4H PRN [name], NP   650 mg at 01/29/13 0554  . allopurinol (ZYLOPRIM) tablet 100 mg  100 mg Oral Daily [name], NP   100 mg at 01/28/13 1127  . ALPRAZolam Prudy Feeler) tablet 0.25 mg  0.25 mg Oral BID PRN [name], NP      . aspirin EC tablet 81 mg  81 mg Oral Daily [name], NP   81 mg at 01/28/13 1127  . dabigatran (PRADAXA) capsule 75 mg  75 mg Oral Q12H [name], NP   75 mg at 01/28/13 2134  . digoxin (LANOXIN) tablet 0.125 mg  0.125 mg Oral QODAY [name], NP   0.125 mg at 01/28/13 1127  . diltiazem (CARDIZEM CD) 24 hr capsule 240 mg  240 mg Oral Daily [name], NP   240 mg at 01/28/13 1127  . docusate sodium (COLACE) capsule 100 mg  100 mg Oral BID PRN [name], NP   100 mg at 01/26/13 2130  . furosemide (LASIX) injection 80 mg  80 mg Intravenous BID [name], NP   80 mg at 01/29/13 0816  . HYDROcodone-acetaminophen (NORCO/VICODIN) 5-325 MG per tablet 1-2 tablet  1-2 tablet Oral Q6H PRN [name], NP   2 tablet at 01/27/13 0733  . insulin aspart (novoLOG) injection 0-9 Units   0-9 Units Subcutaneous TID WC [name], NP   3 Units at 01/28/13 1428  . insulin aspart protamine- aspart (NOVOLOG MIX 70/30) injection 18 Units  18 Units Subcutaneous BID WC Chrystie Nose, MD   18 Units at 01/29/13 [phone]  . metoprolol (LOPRESSOR) tablet 50 mg  50 mg Oral BID [name], NP   50 mg at 01/28/13 2134  . milrinone [facility]) infusion 200 mcg/mL (0.2 mg/ml)  0.25 mcg/kg/min Intravenous Continuous Mihai Croitoru, MD 8.7 mL/hr at 01/28/13 2134 0.25 mcg/kg/min at 01/28/13 2134  . ondansetron (ZOFRAN) injection 4 mg  4 mg Intravenous Q6H PRN [name], NP      . polyethylene glycol (MIRALAX / GLYCOLAX) packet 17 g  17 g Oral Daily PRN [name], NP   17 g at 01/27/13 0733  . potassium chloride (K-DUR,KLOR-CON) CR tablet 10 mEq  10 mEq Oral BID [name], NP   10 mEq at 01/28/13 2134  . sodium chloride 0.9 % injection 3 mL  3 mL Intravenous Q12H [name], NP   3 mL at 01/28/13 2134  . sodium chloride 0.9 % injection 3 mL  3 mL Intravenous PRN [name], NP      . zolpidem [facility]) tablet 5  mg  5 mg Oral QHS PRN [name], NP        PE: General appearance: alert, cooperative and no distress Lungs: faint bibasilar crackles Heart: regular rate and rhythm, S1, S2 normal, no murmur, click, rub or gallop Extremities: no LEE Pulses: 2+ and symmetric Skin: warm and dry Neurologic: Grossly normal  Lab Results:   Recent Labs  01/26/13 1550  WBC 9.7  HGB 12.6*  HCT 37.3*  PLT 200   BMET  Recent Labs  01/27/13 0620 01/28/13 0615 01/29/13 0410  NA 140 140 139  K 3.7 3.4* 3.7  CL 99 98 97  CO2 31 31 30   GLUCOSE 138* 118* 78  BUN 51* 51* 48*  CREATININE 2.92* 2.48* 2.33*  CALCIUM 9.5 9.1 9.1   PT/INR  Recent Labs  01/26/13 1550  LABPROT 19.7*  INR 1.72*    Assessment/Plan  Principal Problem:   Acute on chronic diastolic CHF (congestive heart failure) Active Problems:   Hypertension   DM2 (diabetes mellitus, type 2)   CKD (chronic  kidney disease) stage 3, GFR 30-59 ml/min   PAF (paroxysmal atrial fibrillation)   Chronic anticoagulation   Obstructive sleep apnea, C-pap intol   Tachycardia-bradycardia syndrome, with PPM [employer]    Venous stasis ulcer of ankle   Back pain, lumbosacral, since recent fall   Hypothyroid, recently diagnosed   Plan: Pt continues to diurese well on IV Lasix and Milrinone. -2.7 L in past 24 hours and 6 L total. Weight today is 249 lb. He still needs to diurese an additional 19 lbs to achieve dry weight of 230 lb. BP is stable, electrolytes are WNL and SCr continues to improve, so we will continue with current plan of care: 80 mg IV Lasix BID, continuous Milrinone, daily K+ supplementation, daily weights, strict I/Os and 2 g sodium diet.      LOS: 3 days    Brittainy M. Sharol Harness, PA-C 01/29/2013 9:12 AM   I have seen and evaluated the patient this AM along with Boyce Medici, PA. I agree with her findings, examination as well as impression recommendations..  Continues to have stable diuresis, notable ~~20lb loss from admission.  Still has a ways to go to his baseline dry wgt of ~230 lb.  Responding well to Milrinone gtt - renal function improving. Anticipate at least 2 more days of IV diuresis before starting to wean off of milrinone & IV diuretics.  Leg wound is stable. BP & HR are stable - appears to be in NSR.  On Pradaxa. Not on ACE-I/ARB due to renal failure, consider adding Hydralazine/Nitrate for afterload reduction.  Needs to ambulate - PT consulted.  Marykay Lex, M.D., M.S. [facility] [address] [place], [place]  [address]  [phone] Pager # [phone] 01/29/2013 10:51 AM

## 2013-01-29 NOTE — Progress Notes (Signed)
Pt resting comfortable on bed on RA, VSS, denies any pain or SOB at this time. Pt continues on Milrinone gtt as ordered by MD. Maryclare Labrador continue with POC.

## 2013-01-29 NOTE — Care Management Note (Addendum)
    Page 1 of 2   02/01/2013     11:19:34 AM   CARE MANAGEMENT NOTE 02/01/2013  Patient:  Vincent [name], Vincent [name]   Account Number:  [id number]  Date Initiated:  01/29/2013  Documentation initiated by:  Tera Mater  Subjective/Objective Assessment:   77yo male admitted with CHF.  Pt. lives with spouse.     Action/Plan:   discharge planning/ Home with [facility]   Anticipated DC Date:  02/01/2013   Anticipated DC Plan:  HOME W HOME HEALTH SERVICES      DC Planning Services  CM consult      [facility] Choice  HOME HEALTH   Choice offered to / List presented to:  C-1 Patient        HH arranged  HH-1 RN  HH-10 DISEASE MANAGEMENT  HH-2 PT  HH-3 OT      [facility] agency  [facility]   Status of service:  In process, will continue to follow Medicare Important Message given?   (If response is "NO", the following Medicare IM given date fields will be blank) Date Medicare IM given:   Date Additional Medicare IM given:    Discharge Disposition:    Per UR Regulation:  Reviewed for med. necessity/level of care/duration of stay  If discussed at Long Length of Stay Meetings, dates discussed:    Comments:  02/01/13 1015 Oletta Cohn, RN, BSN, [employer] [phone] Spoke with pt and daughter at bedside.  Pt currentlhy on milrinone gtts and will resume services with [facility] for RN/PT/OT.  01/29/13 1418 In to complete heart failure home health screen, and pt. is interested in having home health RN/PT/OT.  Pt. states he has had [facility] in past and wants Hilda Lias, RN, and Elmyra Ricks, PT.  TC to [place], with [facility], to give referral for [facility] RN/PT/OT and requested the certain care givers.  Physician, please enter home health orders for RN/OT/PT, and complete face to face.  Pt. continues on IV Milrinone gtt.  May dc over weekend. Tera Mater, RN, BSN NCM [phone]

## 2013-01-29 NOTE — Progress Notes (Signed)
Pt up to chair, VSS, Vicodin PO given for moderated pain on his leg as ordered, pt using his urinal with good output. We'll continue with POC.

## 2013-01-29 NOTE — Progress Notes (Signed)
Utilization Review Completed.   Noelle Hoogland, RN, BSN Nurse Case Manager  [phone]  

## 2013-01-29 NOTE — Progress Notes (Signed)
[facility]  Patient Status: Active (receiving services up to time of hospitalization)  [facility] is providing the following services: RN, PT and OT  If patient discharges after hours, please call [phone].   Wynelle Bourgeois 01/29/2013, 11:55 AM

## 2013-01-29 NOTE — Progress Notes (Signed)
Ambulated Pt to from chair to bathroom and back

## 2013-01-29 NOTE — Progress Notes (Signed)
Patient evaluated for community based chronic disease management services with [facility] as a benefit of patient's [employer]. Spoke with patient at bedside to explain [facility] Care Management services.  Patient indicated that he can will receive services from [facility] upon discharge.  He further explained that he felt this was adequate at this time.  He was appreciate for this second visit and will request services as needed.  Asked [facility] hospital liaison to refer to [place] as needed.  Left contact information and [facility] literature at bedside. Made inpatient Case Manager aware that [facility] Care Management following. Of note, [facility] Care Management services does not replace or interfere with any services that are arranged by inpatient case management or social work.  For additional questions or referrals please contact Anibal Henderson BSN RN [facility] Liaison at [phone].

## 2013-01-29 NOTE — Progress Notes (Signed)
Pt in bed no complaints of pain or SOB. Will continue to monitor  

## 2013-01-30 DIAGNOSIS — R5381 Other malaise: Secondary | ICD-10-CM

## 2013-01-30 LAB — URINALYSIS, ROUTINE W REFLEX MICROSCOPIC
Leukocytes, UA: NEGATIVE
Nitrite: NEGATIVE
Specific Gravity, Urine: 1.008 (ref 1.005–1.030)
Urobilinogen, UA: 0.2 mg/dL (ref 0.0–1.0)
pH: 7.5 (ref 5.0–8.0)

## 2013-01-30 LAB — URINE MICROSCOPIC-ADD ON

## 2013-01-30 LAB — GLUCOSE, CAPILLARY
Glucose-Capillary: 164 mg/dL — ABNORMAL HIGH (ref 70–99)
Glucose-Capillary: 136 mg/dL — ABNORMAL HIGH (ref 70–99)
Glucose-Capillary: 114 mg/dL — ABNORMAL HIGH (ref 70–99)

## 2013-01-30 NOTE — Progress Notes (Signed)
Patient ID: Vincent [name], male   DOB: 06/06/1935, 77 y.o.   MRN: [id number]  Subjective: No complaints. He denies SOB.  Walking in the hall with PT. RN noted mild pink tinge to urine - + RBC on UA.  Objective: Vital signs in last 24 hours: Temp:  [97.1 F (36.2 C)-98.2 F (36.8 C)] 97.1 F (36.2 C) (10/18 0546) Pulse Rate:  [73-91] 75 (10/18 1102) Resp:  [18-20] 18 (10/18 0942) BP: (110-122)/(48-79) 122/54 mmHg (10/18 0942) SpO2:  [100 %] 100 % (10/18 0942) Weight:  [243 lb 1.6 oz (110.269 kg)] 243 lb 1.6 oz (110.269 kg) (10/18 0546) Last BM Date: 01/28/13  Intake/Output from previous day: 10/17 0701 - 10/18 0700 In: 1680.9 [P.O.:1178; I.V.:502.9] Out: 4050 [Urine:4050] Intake/Output this shift: Total I/O In: 613.7 [P.O.:360; I.V.:253.7] Out: 1150 [Urine:1150] TELE - ? Aflutter vs. NSR (hard to tell), but stable rate.  Medications Current Facility-Administered Medications  Medication Dose Route Frequency Provider Last Rate Last Dose  . 0.9 %  sodium chloride infusion  250 mL Intravenous PRN [name], NP 10 mL/hr at 01/29/13 0836 250 mL at 01/29/13 0836  . acetaminophen (TYLENOL) tablet 650 mg  650 mg Oral Q4H PRN [name], NP   650 mg at 01/29/13 0554  . allopurinol (ZYLOPRIM) tablet 100 mg  100 mg Oral Daily [name], NP   100 mg at 01/30/13 0945  . ALPRAZolam Prudy Feeler) tablet 0.25 mg  0.25 mg Oral BID PRN [name], NP      . aspirin EC tablet 81 mg  81 mg Oral Daily [name], NP   81 mg at 01/30/13 0945  . dabigatran (PRADAXA) capsule 75 mg  75 mg Oral Q12H [name], NP   75 mg at 01/30/13 0946  . digoxin (LANOXIN) tablet 0.125 mg  0.125 mg Oral QODAY [name], NP   0.125 mg at 01/30/13 0945  . diltiazem (CARDIZEM CD) 24 hr capsule 240 mg  240 mg Oral Daily [name], NP   240 mg at 01/30/13 0945  . docusate sodium (COLACE) capsule 100 mg  100 mg Oral BID PRN [name], NP   100 mg at 01/29/13 1536  . furosemide (LASIX) injection 80 mg   80 mg Intravenous BID [name], NP   80 mg at 01/30/13 0729  . hydrALAZINE (APRESOLINE) tablet 25 mg  25 mg Oral Q8H Marykay Lex, MD   25 mg at 01/30/13 0600  . HYDROcodone-acetaminophen (NORCO/VICODIN) 5-325 MG per tablet 1-2 tablet  1-2 tablet Oral Q6H PRN [name], NP   1 tablet at 01/30/13 0600  . insulin aspart (novoLOG) injection 0-9 Units  0-9 Units Subcutaneous TID WC [name], NP   1 Units at 01/30/13 0601  . insulin aspart protamine- aspart (NOVOLOG MIX 70/30) injection 18 Units  18 Units Subcutaneous BID WC Chrystie Nose, MD   18 Units at 01/30/13 [phone]  . isosorbide dinitrate (ISORDIL) tablet 10 mg  10 mg Oral TID Marykay Lex, MD   10 mg at 01/30/13 0600  . metoprolol (LOPRESSOR) tablet 50 mg  50 mg Oral BID [name], NP   50 mg at 01/30/13 0945  . milrinone (PRIMACOR) infusion 200 mcg/mL (0.2 mg/ml)  0.25 mcg/kg/min Intravenous Continuous Mihai Croitoru, MD 8.7 mL/hr at 01/30/13 0837 0.25 mcg/kg/min at 01/30/13 0837  . ondansetron (ZOFRAN) injection 4 mg  4 mg Intravenous Q6H PRN [name], NP      . polyethylene glycol [facility] /  GLYCOLAX) packet 17 g  17 g Oral Daily PRN [name], NP   17 g at 01/27/13 0733  . potassium chloride (K-DUR,KLOR-CON) CR tablet 10 mEq  10 mEq Oral BID [name], NP   10 mEq at 01/30/13 0945  . sodium chloride 0.9 % injection 3 mL  3 mL Intravenous Q12H [name], NP   3 mL at 01/29/13 2100  . sodium chloride 0.9 % injection 3 mL  3 mL Intravenous PRN [name], NP      . zolpidem (AMBIEN) tablet 5 mg  5 mg Oral QHS PRN [name], NP       PE: General appearance: alert, cooperative and no distress Lungs: faint bibasilar crackles Heart: regular rate and rhythm, S1, S2 normal, no murmur, click, rub or gallop Extremities: no LEE Pulses: 2+ and symmetric Skin: warm and dry Neurologic: Grossly normal  Lab Results:  No results found for this basename: WBC, HGB, HCT, PLT,  in the last 72 hours BMET  Recent  Labs  01/28/13 0615 01/29/13 0410  NA 140 139  K 3.4* 3.7  CL 98 97  CO2 31 30  GLUCOSE 118* 78  BUN 51* 48*  CREATININE 2.48* 2.33*  CALCIUM 9.1 9.1   PT/INR No results found for this basename: LABPROT, INR,  in the last 72 hours  Assessment/Plan  Principal Problem:   Acute on chronic diastolic CHF (congestive heart failure) Active Problems:   DM2 (diabetes mellitus, type 2)   PAF (paroxysmal atrial fibrillation)   Hypertension   CKD (chronic kidney disease) stage 3, GFR 30-59 ml/min   Chronic anticoagulation   Obstructive sleep apnea, C-pap intol   Tachycardia-bradycardia syndrome, with PPM [employer]    Venous stasis ulcer of ankle   Back pain, lumbosacral, since recent fall   Hypothyroid, recently diagnosed     LOS: 4 days   Looks great today -- up walking the halls with PT.  Pt continues to diurese well on IV Lasix and Milrinone. -2.4 L in past 24 hours and ~9 L total. Weight today is 243 lb. He still needs to diurese an additional 13 lbs to achieve dry weight of 230 lb. BP is stable, electrolytes are WNL and SCr continues to improve, so we will continue with current plan of care: 80 mg IV Lasix BID, continuous Milrinone, daily K+ supplementation, daily weights, strict I/Os and 2 g sodium diet.   Anticipate at least today & tomorrow with IV diuresis before starting to wean off of milrinone & IV diuretics.  K is still drifting below 4 - will increase supplement dose.  Leg wound is stable. BP & HR are stable - appears to be in NSR.  On Pradaxa - mild hematuria (will need to monitor & consider OP Urology c/w) Not on ACE-I/ARB due to renal failure, tolerating Hydralazine/Nitrate for afterload reduction. HR controlled on BB dose.  Excellent glycemic control.  Continue to ambulate  Marykay Lex, M.D., M.S. [facility] [address] [place], [place]  [address]  [phone] Pager # [phone] 01/30/2013 12:47  PM

## 2013-01-30 NOTE — Progress Notes (Addendum)
Pt urine appeared pink. Page md UA ordered, UA showed RBC in urine. md aware. Will continue to monitor

## 2013-01-30 NOTE — Progress Notes (Signed)
Pt in chair no complaints of Cp  or SOB. Will continue to monitor

## 2013-01-30 NOTE — Evaluation (Signed)
Physical Therapy Evaluation Patient Details Name: Vincent [name] MRN: [id number] DOB: 01-02-1936 Today's Date: 01/30/2013 Time: 4098-1191 Vincent Time Calculation (min): 28 min  Vincent Assessment / Plan / Recommendation History of Present Illness  Vincent [name] is a 77 year old gentleman who has been seen since his recent hospitalization a couple of times, ongoing issues with atrial fibrillation and rapid ventricular response. He had a pacemaker placed in February 2013, chronic CHF  Clinical Impression  Vincent is a very pleasant gentleman with several falls and hospital admissions this year with progression of weakness with each admission. Vincent with below deficits along with above and will benefit from acute therapy to maximize mobility, strength and function prior to discharge to decrease burden of care and increase independence. Vincent encouraged to ambulate daily with nursing assist and continue HEP.    Vincent Assessment  Patient needs continued Vincent services    Follow Up Recommendations  [name] Vincent;Supervision - Intermittent    Does the patient have the potential to tolerate intense rehabilitation      Barriers to Discharge        Equipment Recommendations  None recommended by Vincent    Recommendations for Other Services     Frequency Min 3X/week    Precautions / Restrictions Precautions Precautions: Fall Precaution Comments: Vincent reports at least 4 falls this year with one less than a wk ago Restrictions Weight Bearing Restrictions: No   Pertinent Vitals/Pain HR 75 No pain      Mobility  Bed Mobility Bed Mobility: Not assessed Details for Bed Mobility Assistance: Vincent in chair on arrival Transfers Sit to Stand: 6: Modified independent (Device/Increase time);With armrests;From chair/3-in-1 Stand to Sit: 6: Modified independent (Device/Increase time);To chair/3-in-1;With armrests Ambulation/Gait Ambulation/Gait Assistance: 5: Supervision Ambulation Distance (Feet): 300 Feet Assistive device:  Rolling walker Ambulation/Gait Assistance Details: cueing for posture and stride length Gait Pattern: Step-through pattern;Decreased stride length Gait velocity: 57ft/30sec=.833 placing Vincent at increased fall risk Stairs: No    Exercises     Vincent Diagnosis: Difficulty walking;Generalized weakness  Vincent Problem List: Decreased strength;Decreased activity tolerance;Decreased knowledge of use of DME;Decreased mobility Vincent Treatment Interventions: Gait training;DME instruction;Functional mobility training;Therapeutic activities;Therapeutic exercise;Patient/family education     Vincent Goals(Current goals can be found in the care plan section) Acute Rehab Vincent [name] Vincent Goal Formulation: With patient Time For Goal Achievement: 02/13/13 Potential to Achieve Goals: Good  Visit Information  Last Vincent Received On: 01/30/13 Assistance Needed: +1 History of Present Illness: Vincent [name] is a 77 year old gentleman who has been seen since his recent hospitalization a couple of times, ongoing issues with atrial fibrillation and rapid ventricular response. He had a pacemaker placed in February 2013, chronic CHF       Prior Functioning  [name] Living Family/patient expects to be discharged to:: Private residence Living Arrangements: Spouse/significant other Available Help at Discharge: Family;Other (Comment) (hired caregiver 5 days/wk 8am-2pm) Type of [name]: House [name] Access: Ramped entrance [name] Layout: One level [name] Equipment: Walker - 4 wheels;Other (comment);Tub bench;Wheelchair - manual (rollator w/seat) Additional Comments: aide 5days/wk for 6hrs Prior Function Level of Independence: Needs assistance Gait / Transfers Assistance Needed: Vincent ambulates with rollator outside and furniture walks inside ADL's / Homemaking Assistance Needed: aide assists with ADLs and housework Communication Communication: No difficulties    Cognition  Cognition Arousal/Alertness:  Awake/alert Behavior During Therapy: WFL for tasks assessed/performed Overall Cognitive Status: Within Functional Limits for tasks assessed    Extremity/Trunk Assessment Upper Extremity Assessment  Upper Extremity Assessment: Overall WFL for tasks assessed Lower Extremity Assessment Lower Extremity Assessment: Generalized weakness (3+/5 hip flexion and knee flex, 4/5 knee extension) Cervical / Trunk Assessment Cervical / Trunk Assessment: Normal   Balance    End of Session Vincent - End of Session Equipment Utilized During Treatment: Gait belt Activity Tolerance: Patient tolerated treatment well Patient left: in chair;with call bell/phone within reach  GP     Vincent [name] 01/30/2013, 11:09 AM Vincent [name], Vincent [phone]

## 2013-01-31 DIAGNOSIS — N189 Chronic kidney disease, unspecified: Secondary | ICD-10-CM | POA: Diagnosis present

## 2013-01-31 DIAGNOSIS — I422 Other hypertrophic cardiomyopathy: Secondary | ICD-10-CM | POA: Diagnosis present

## 2013-01-31 LAB — GLUCOSE, CAPILLARY
Glucose-Capillary: 168 mg/dL — ABNORMAL HIGH (ref 70–99)
Glucose-Capillary: 143 mg/dL — ABNORMAL HIGH (ref 70–99)
Glucose-Capillary: 184 mg/dL — ABNORMAL HIGH (ref 70–99)

## 2013-01-31 LAB — COMPREHENSIVE METABOLIC PANEL
AST: 26 U/L (ref 0–37)
Albumin: 3.3 g/dL — ABNORMAL LOW (ref 3.5–5.2)
Alkaline Phosphatase: 111 U/L (ref 39–117)
BUN: 51 mg/dL — ABNORMAL HIGH (ref 6–23)
CO2: 34 mEq/L — ABNORMAL HIGH (ref 19–32)
Creatinine, Ser: 2.06 mg/dL — ABNORMAL HIGH (ref 0.50–1.35)
Potassium: 3.5 mEq/L (ref 3.5–5.1)
Sodium: 136 mEq/L (ref 135–145)
Total Protein: 7.3 g/dL (ref 6.0–8.3)

## 2013-01-31 LAB — PRO B NATRIURETIC PEPTIDE: Pro B Natriuretic peptide (BNP): 3302 pg/mL — ABNORMAL HIGH (ref 0–450)

## 2013-01-31 MED ORDER — POTASSIUM CHLORIDE CRYS ER 20 MEQ PO TBCR
20.0000 meq | EXTENDED_RELEASE_TABLET | Freq: Once | ORAL | Status: AC
  Administered 2013-01-31: 20 meq via ORAL
  Filled 2013-01-31: qty 1

## 2013-01-31 MED ORDER — POTASSIUM CHLORIDE CRYS ER 20 MEQ PO TBCR
20.0000 meq | EXTENDED_RELEASE_TABLET | Freq: Two times a day (BID) | ORAL | Status: DC
  Administered 2013-01-31 – 2013-02-02 (×4): 20 meq via ORAL
  Filled 2013-01-31 (×5): qty 1

## 2013-01-31 MED ORDER — POTASSIUM CHLORIDE 20 MEQ PO PACK: 20.0000 meq | PACK | Freq: Once | ORAL | Status: DC

## 2013-01-31 NOTE — Progress Notes (Signed)
Subjective:  SOB improving, he still has LE edema - legs are wrapped  Objective:  Vital Signs in the last 24 hours: Temp:  [97.2 F (36.2 C)-98.7 F (37.1 C)] 97.2 F (36.2 C) (10/19 0954) Pulse Rate:  [70-101] 70 (10/19 0954) Resp:  [18-20] 20 (10/19 0954) BP: (110-147)/(47-59) 110/55 mmHg (10/19 0954) SpO2:  [97 %-100 %] 100 % (10/19 0954) Weight:  [242 lb 4.8 oz (109.907 kg)] 242 lb 4.8 oz (109.907 kg) (10/19 0559)  Intake/Output from previous day:  Intake/Output Summary (Last 24 hours) at 01/31/13 1122 Last data filed at 01/31/13 0941  Gross per 24 hour  Intake 1461.21 ml  Output   2027 ml  Net -565.79 ml    Physical Exam: General appearance: alert, cooperative, no distress and moderately obese Lungs: clear to auscultation bilaterally Heart: regular rate and rhythm Extremities: venous stasis dermatitis noted and 1-2+ edema   Rate: 70  Rhythm: normal sinus rhythm  Lab Results: No results found for this basename: WBC, HGB, PLT,  in the last 72 hours  Recent Labs  01/29/13 0410 01/31/13 0100  NA 139 136  K 3.7 3.5  CL 97 92*  CO2 30 34*  GLUCOSE 78 202*  BUN 48* 51*  CREATININE 2.33* 2.06*   No results found for this basename: TROPONINI, CK, MB,  in the last 72 hours No results found for this basename: INR,  in the last 72 hours  Imaging: Imaging results have been reviewed  Cardiac Studies:  Assessment/Plan:   Principal Problem:   Acute on chronic diastolic CHF (congestive heart failure) Active Problems:   CKD (chronic kidney disease) stage 3, GFR 30-59 ml/min   LA thrombus on echo Feb 2014   Chronic Diastolic Dysfunction -- exacerbated by Afib RVR; EF ~45%   PAF (paroxysmal atrial fibrillation)   Chronic anticoagulation-Pradaxa   Tachycardia-bradycardia syndrome, s/p [employer] PTVDP Feb 2014   Non ischemic cardiomyopathy- EF 40-45% 2D Feb 2014   Acute on chronic renal insufficiency   Hypertension   Prostate cancer   DM2 (diabetes  mellitus, type 2)   Normal coronary arteries at cath 2009   Obstructive sleep apnea, C-pap intol   Venous stasis ulcer of ankle   Back pain, lumbosacral, since recent fall   Hypothyroid, recently diagnosed     PLAN: Per Dr Herbie Baltimore- continue Milrinone and IV Lasix, goal wgt 230 (adm wgt 258, today's wgt 242).  SCr stable - 2.06  Corine Shelter PA-C Beeper [phone] 01/31/2013, 11:22 AM  I have seen and evaluated the patient this a.m. along with Corine Shelter, PA. I agree with his findings, examination as well as impression recommendations. Weight down to 242 pounds.  Still has a ways to go, but is slowing down his diuresis.  I will continue milrinone and IV Lasix today, reassess in the morning, will likely convert to oral medications in the next day or 2.  Continue current dose of beta blocker as well as afterload reduction with hydralazine/nitrate.  With blood pressure concerns, we may want to consider reducing diltiazem  Continues on low-dose digoxin, level was essentially undetectable on admission.  Would recheck prior to discharge.  Pro--BNP level has come down, but not dramatically.  I don't know how much lower it will go.  Mild hematuria noted yesterday.  Will continue to monitor.  Pradaxa and therapeutic.  Left leg venous stasis ulcer seems to be healing well.  Appreciate wound care assistance.  Just recommendations provided.  Increasing potassium repletion as his  levels are still somewhat low.  Marykay Lex, M.D., M.S. [facility] [address] [place], [place]  [address]  [phone] Pager # [phone] 01/31/2013 11:38 AM

## 2013-01-31 NOTE — Plan of Care (Signed)
Problem: Phase II Progression Outcomes Goal: Walk in hall or up in chair TID Outcome: Completed/Met Date Met:  01/31/13 UP in chair with 1 person assist

## 2013-01-31 NOTE — Progress Notes (Signed)
01/30/13 7p-7a Pt.A/Ox4 and is ambulatory with 1 person assist. He had c/o left lower leg pain and prn tylenol was given. IV restart was done by IV team during the shift. Patient is voiding with no c/o difficulty however urine is still pink tinged. He had no signs of distress during the shift.

## 2013-02-01 LAB — GLUCOSE, CAPILLARY
Glucose-Capillary: 179 mg/dL — ABNORMAL HIGH (ref 70–99)
Glucose-Capillary: 283 mg/dL — ABNORMAL HIGH (ref 70–99)
Glucose-Capillary: 239 mg/dL — ABNORMAL HIGH (ref 70–99)

## 2013-02-01 MED ORDER — FUROSEMIDE 80 MG PO TABS
80.0000 mg | ORAL_TABLET | Freq: Two times a day (BID) | ORAL | Status: DC
  Administered 2013-02-01 – 2013-02-02 (×3): 80 mg via ORAL
  Filled 2013-02-01 (×4): qty 1

## 2013-02-01 MED ORDER — MILRINONE IN DEXTROSE 20 MG/100ML IV SOLN
0.2500 ug/kg/min | INTRAVENOUS | Status: AC
  Administered 2013-02-01: 12:00:00 0.25 ug/kg/min via INTRAVENOUS

## 2013-02-01 NOTE — Progress Notes (Signed)
PT Cancellation Note  Patient Details Name: KAYA KLAUSING MRN: [id number] DOB: 12/27/1935   Cancelled Treatment:    Reason Eval/Treat Not Completed: Pain limiting ability to participate (pt reports 6/10 general ache of legs and back and unwilling to mobilize)   Toney Sang Beth 02/01/2013, 9:37 AM [name], PT [phone]

## 2013-02-01 NOTE — Progress Notes (Signed)
Subjective:  "Ache all over" started this am, no fever or chills.   Objective:  Vital Signs in the last 24 hours: Temp:  [97.5 F (36.4 C)-99 F (37.2 C)] 98.5 F (36.9 C) (10/20 0937) Pulse Rate:  [64-92] 92 (10/20 0937) Resp:  [16-20] 20 (10/20 0937) BP: (114-149)/(55-66) 123/66 mmHg (10/20 0938) SpO2:  [94 %-100 %] 94 % (10/20 0937) Weight:  [239 lb (108.41 kg)] 239 lb (108.41 kg) (10/20 0531)  Intake/Output from previous day:  Intake/Output Summary (Last 24 hours) at 02/01/13 1024 Last data filed at 02/01/13 0859  Gross per 24 hour  Intake 1529.64 ml  Output   1000 ml  Net 529.64 ml    Physical Exam: General appearance: alert, cooperative, no distress and moderately obese Lungs: decreased breath sounds, few basilar rales Heart: regular rate and rhythm Extremities: both LE bandaged, edema improved   Rate: 88  Rhythm: normal sinus rhythm and premature atrial contractions (PAC)  Lab Results: No results found for this basename: WBC, HGB, PLT,  in the last 72 hours  Recent Labs  01/31/13 0100  NA 136  K 3.5  CL 92*  CO2 34*  GLUCOSE 202*  BUN 51*  CREATININE 2.06*   No results found for this basename: TROPONINI, CK, MB,  in the last 72 hours No results found for this basename: INR,  in the last 72 hours  Imaging: Imaging results have been reviewed  Cardiac Studies:  Assessment/Plan:   Principal Problem:   Acute on chronic diastolic CHF (congestive heart failure) Active Problems:   CKD (chronic kidney disease) stage 3, GFR 30-59 ml/min   LA thrombus on echo Feb 2014   Chronic Diastolic Dysfunction -- exacerbated by Afib RVR; EF ~45%   PAF (paroxysmal atrial fibrillation)   Chronic anticoagulation-Pradaxa   Tachycardia-bradycardia syndrome, s/p [employer] PTVDP Feb 2014   Non ischemic cardiomyopathy- EF 40-45% 2D Feb 2014   Acute on chronic renal insufficiency   Hypertension   Prostate cancer   DM2 (diabetes mellitus, type 2)   Normal  coronary arteries at cath 2009   Obstructive sleep apnea, C-pap intol   Venous stasis ulcer of ankle   Back pain, lumbosacral, since recent fall   Hypothyroid, recently diagnosed     PLAN: Taper off Milrinone today, change Lasix to po - 80mg  BID., check f/u BNP, BMP in am.  ? Home Zaroxolyn    [place] PA-C Beeper [phone] 02/01/2013, 10:24 AM

## 2013-02-01 NOTE — Progress Notes (Addendum)
Pt HR has been in A fib off and on today per tele monitor. Pt with history of Afib. Current strip this shift is A fib. HR in 80s. Pt stable. Pt received dose of pradaxa, and lopressor this PM. MD on call [place] notified. No new orders. Will continue to monitor. Baron Hamper, RN

## 2013-02-01 NOTE — Progress Notes (Signed)
Called to pateint's room at 1700.  Patient had pulled IV out.   Attempted IV restart unsuccessfully.  IV team notifed.

## 2013-02-01 NOTE — Progress Notes (Signed)
Pt with no IV at this time. Day shift RN and IV RN attempted IV without success. notified MD and stated ok to leave IV out, pt to be weaned off milrinone. Baron Hamper, RN

## 2013-02-01 NOTE — Progress Notes (Signed)
Pt.is resting in bed. He is A/Ox4 and is ambulatory with 1 person assist. He is on continuous milrinone drip. He has had no c/o pain and no signs of distress. He uses the urinal but does have some occasional urinary incontinence.

## 2013-02-01 NOTE — Progress Notes (Signed)
Pt. Seen and examined. Agree with the NP/PA-C note as written.  Breathing has improved significantly, no further orthopnea. Net out measured at 10L.  Urine output is slowing, he may be close to a new dry weight. Wean off milrinone and restart oral lasix - hold zaroxolyn for now. Weight down to 239 from 272 lbs. Hopeful d/c in 1-2 days once stabilized on oral diuretics. Re-check BMP/BNP in am.  Chrystie Nose, MD, [facility] Attending Cardiologist [facility]

## 2013-02-02 LAB — BASIC METABOLIC PANEL
BUN: 44 mg/dL — ABNORMAL HIGH (ref 6–23)
CO2: 34 mEq/L — ABNORMAL HIGH (ref 19–32)
Calcium: 9.4 mg/dL (ref 8.4–10.5)
Chloride: 94 mEq/L — ABNORMAL LOW (ref 96–112)
Creatinine, Ser: 1.76 mg/dL — ABNORMAL HIGH (ref 0.50–1.35)
GFR calc Af Amer: 42 mL/min — ABNORMAL LOW (ref >90)
GFR calc non Af Amer: 36 mL/min — ABNORMAL LOW (ref >90)
Glucose, Bld: 150 mg/dL — ABNORMAL HIGH (ref 70–99)
Potassium: 3.5 mEq/L (ref 3.5–5.1)
Sodium: 137 mEq/L (ref 135–145)

## 2013-02-02 LAB — PRO B NATRIURETIC PEPTIDE: Pro B Natriuretic peptide (BNP): 6692 pg/mL — ABNORMAL HIGH (ref 0–450)

## 2013-02-02 LAB — GLUCOSE, CAPILLARY: Glucose-Capillary: 197 mg/dL — ABNORMAL HIGH (ref 70–99)

## 2013-02-02 MED ORDER — DABIGATRAN ETEXILATE MESYLATE 150 MG PO CAPS: 150.0000 mg | ORAL_CAPSULE | Freq: Two times a day (BID) | ORAL | Status: DC

## 2013-02-02 MED ORDER — ISOSORBIDE DINITRATE 30 MG PO TABS
30.0000 mg | ORAL_TABLET | Freq: Two times a day (BID) | ORAL | Status: DC
  Filled 2013-02-02: qty 1

## 2013-02-02 MED ORDER — HYDRALAZINE HCL 50 MG PO TABS
50.0000 mg | ORAL_TABLET | Freq: Two times a day (BID) | ORAL | Status: DC
  Filled 2013-02-02 (×2): qty 1

## 2013-02-02 MED ORDER — METOLAZONE 5 MG PO TABS
5.0000 mg | ORAL_TABLET | Freq: Every day | ORAL | Status: DC
  Administered 2013-02-02: 14:00:00 5 mg via ORAL
  Filled 2013-02-02 (×2): qty 1

## 2013-02-02 MED ORDER — ISOSORBIDE DINITRATE 30 MG PO TABS: 30.0000 mg | ORAL_TABLET | Freq: Two times a day (BID) | ORAL | Status: DC

## 2013-02-02 MED ORDER — ASPIRIN 81 MG PO TBEC: 81.0000 mg | DELAYED_RELEASE_TABLET | Freq: Every day | ORAL | Status: DC

## 2013-02-02 MED ORDER — HYDRALAZINE HCL 50 MG PO TABS: 50.0000 mg | ORAL_TABLET | Freq: Two times a day (BID) | ORAL | Status: DC

## 2013-02-02 NOTE — Progress Notes (Signed)
DC IV, DC Tele, DC Home. Discharge instructions and home medications discussed with patient and patient's family member. Patient and family member denied any questions or concerns at this time. Patient leaving unit via wheelchair and appears in no acute distress. 

## 2013-02-02 NOTE — Progress Notes (Signed)
Physical Therapy Treatment Patient Details Name: RAKEEM COLLEY MRN: [id number] DOB: September 20, 1935 Today's Date: 02/02/2013 Time: 1006-1030 PT Time Calculation (min): 24 min  PT Assessment / Plan / Recommendation  History of Present Illness MARWAN LIPE is a 77 year old gentleman who has been seen since his recent hospitalization a couple of times, ongoing issues with atrial fibrillation and rapid ventricular response. He had a pacemaker placed in February 2013, chronic CHF   PT Comments   Pt admitted with above. Pt currently with functional limitations due to continued deficits in strength and endurance.  Pt will benefit from skilled PT to increase their independence and safety with mobility to allow discharge to the venue listed below.   Follow Up Recommendations  Home health PT;Supervision - Intermittent                 Equipment Recommendations  None recommended by PT        Frequency Min 3X/week   Progress towards PT Goals Progress towards PT goals: Progressing toward goals  Plan Current plan remains appropriate    Precautions / Restrictions Precautions Precautions: Fall Restrictions Weight Bearing Restrictions: No   Pertinent Vitals/Pain VSS, No pain    Mobility  Bed Mobility Bed Mobility: Rolling Left;Left Sidelying to Sit;Sitting - Scoot to Edge of Bed Rolling Left: 4: Min assist Left Sidelying to Sit: 4: Min assist;HOB elevated Sitting - Scoot to Edge of Bed: 4: Min assist Details for Bed Mobility Assistance: Took incr time to get to EOB.  Pt struggled to get to EOB needing min assist.   Transfers Transfers: Sit to Stand;Stand to Sit Sit to Stand: 4: Min assist;With upper extremity assist;From bed Stand to Sit: 4: Min assist;With upper extremity assist;With armrests;To chair/3-in-1 Details for Transfer Assistance: cues for hand placement. Ambulation/Gait Ambulation/Gait Assistance: 4: Min assist Ambulation Distance (Feet): 85 Feet Assistive device: Rolling  walker Ambulation/Gait Assistance Details: cueing for posture and for sequencing steps and RW.   Gait Pattern: Step-through pattern;Decreased stride length Stairs: No Wheelchair Mobility Wheelchair Mobility: No    Exercises General Exercises - Lower Extremity Long Arc Quad: AROM;Both;15 reps;Seated Hip Flexion/Marching: AROM;Both;10 reps;Seated    PT Goals (current goals can now be found in the care plan section)    Visit Information  Last PT Received On: 02/02/13 Assistance Needed: +1 History of Present Illness: DAYVEON HALLEY is a 77 year old gentleman who has been seen since his recent hospitalization a couple of times, ongoing issues with atrial fibrillation and rapid ventricular response. He had a pacemaker placed in February 2013, chronic CHF    Subjective Data  Subjective: "I am so weak."   Cognition  Cognition Arousal/Alertness: Awake/alert Behavior During Therapy: WFL for tasks assessed/performed Overall Cognitive Status: Within Functional Limits for tasks assessed    Balance     End of Session PT - End of Session Equipment Utilized During Treatment: Gait belt Activity Tolerance: Patient tolerated treatment well Patient left: in chair;with call bell/phone within reach Nurse Communication: Mobility status       INGOLD,Shacoria Latif 02/02/2013, 12:59 PM  [facility] Acute Rehabilitation [phone] (pager)

## 2013-02-02 NOTE — Progress Notes (Signed)
Made call to patient's daughter Madelyn Brunner regarding [facility] services per the request of patient's son in law.  She indicated that she was at a doctor appointment with her mother and would like a call back at a later time.  Will attempt to reach out to her this afternoon for follow up.  Of note, [facility] Care Management services does not replace or interfere with any services that are arranged by inpatient case management or social work.  For additional questions or referrals please contact Anibal Henderson BSN RN [facility] Liaison at [phone].

## 2013-02-02 NOTE — Discharge Summary (Signed)
Physician Discharge Summary  Patient ID: Vincent [name] MRN: [id number] DOB/AGE: [birth year] 77 y.o.  Admit date: 01/26/2013 Discharge date: 02/02/2013  Admission Diagnoses: Acute on Chronic Diastolic CHF  Discharge Diagnoses:  Principal Problem:   Acute on chronic diastolic CHF (congestive heart failure) Active Problems:   Hypertension   Prostate cancer   DM2 (diabetes mellitus, type 2)   CKD (chronic kidney disease) stage 3, GFR 30-59 ml/min   LA thrombus on echo Feb 2014   Chronic Diastolic Dysfunction -- exacerbated by Afib RVR; EF ~45%   PAF (paroxysmal atrial fibrillation)   Chronic anticoagulation-Pradaxa   Normal coronary arteries at cath 2009   Obstructive sleep apnea, C-pap intol   Tachycardia-bradycardia syndrome, s/p [employer] PTVDP Feb 2014   Venous stasis ulcer of ankle   Back pain, lumbosacral, since recent fall   Hypothyroid, recently diagnosed    Non ischemic cardiomyopathy- EF 40-45% 2D Feb 2014   Acute on chronic renal insufficiency   Discharged Condition: stable  Hospital Course: The patient is a 77 y/o male, whose primary cardiologist is Dr. Rennis Golden, however Dr. Royann Shivers follows him for his pacemaker. He has a history of mild nonischemic cardiomyopathy, EF of about 45%, and chronic combined systolic and diastolic heart failure with insulin-dependent diabetes. He also has a history of PAF, on Cardizem, Lopressor and Digoxin. He takes Pradaxa for stroke prophylaxis. His PPM was inserted in February 2013.   The patient was seen by Dr. Rennis Golden in clinic on 01/26/13 for evaluation of dyspnea and weight gain. He endorsed worsening abdominal girth, lower extremity swelling, orthopnea, PND and other associated heart failure symptoms, despite being on high dose diuretic therapy, which included 80 mg of Lasix BID and 5 mg of Zaroxolyn daily. His weight in the office was 272 lb, which was well above his dry weight of 230 lbs. Outpatient labs showed an elevated BNP of  4216 and a SCr that had increased from 2.10 to 3.06 within 4 days. Dr. Rennis Golden felt that his condition could not be managed appropriately in the outpatient setting. The patient was admitted directly from the office to [facility] for IV diuretics. Zaroxolyn was discontinued and low dose Milrinone was added as an adjunct. The patient had good diuresis. He diuresed nearly 10 L. His weight decreased from 272 to 235 lbs. His symptoms resolved and his renal function improved. Scr. decreased to 1.76. Milrinone was discontinued. He was restarted on Zaroxolyn and placed back on PO Lasix. His hydralazine and nitrate were both increased for further afterload reduction.  His Hydralazine was increased to  50 mg BID and Isordil to 30 mg BID. All other home meds were continued. On hospital day 7, he was last seen and examined by Dr. Herbie Baltimore, who determined that he was stable for discharge home.  The patient was informed to continue weighing himself daily. He was provided sliding scale Lasix dosing instructions, based on daily weights. He will receive a follow-up outpatient phone call, within 2 days post discharge to assess status, medication and dietary compliance. He will follow-up with Wilburt Finlay, PA-C, on 02/08/13 . He will have follow-up pro-BNP and a BMP done as an outpatient. He will also need additional close follow-up with Dr. Rennis Golden.     Consults: None  Significant Diagnostic Studies: None  Treatments: See Hospital Course  Discharge Exam: Blood pressure 139/65, pulse 96, temperature 98.6 F (37 C), temperature source Oral, resp. rate 19, height 5\' 9"  (1.753 m), weight 235 lb 10.8 oz (106.9 kg), SpO2  100.00%.   Disposition: 06-Home-Health Care Svc      Discharge Orders   Future Appointments Provider Department Dept Phone   02/04/2013 12:00 PM [facility] [phone]   02/08/2013 2:40 PM [address] [facility] [phone]   04/22/2013 6:35 AM [facility]  [facility] [phone]   04/23/2013 11:30 AM Mauri [facility] [phone]   04/23/2013 12:00 PM Benjiman Core, MD [facility] [phone]   Future Orders Complete By Expires   Diet - low sodium heart healthy  As directed    Discharge instructions  As directed    Comments:     Sliding scale Lasix: Weigh yourself when you get home, then Daily in the Morning. Your dry weight will be what your scale says on the day you return home.(here is ~235 lbs.).   If you gain more than 3 pounds from dry weight: Increase the Lasix dosing to 120 mg 2 x daily until weight returns to baseline dry weight. If weight gain is greater than 5 pounds in 2 days: Increased to Lasix 160 mg in AM & 120 mg in PM, and contact the office for further assistance if weight does not go down the next day. If the weight goes down more than 3 pounds from dry weight: Hold Lasix until it returns to baseline dry weight   Increase activity slowly  As directed        Medication List         acetaminophen 325 MG tablet  Commonly known as:  TYLENOL  Take 650 mg by mouth every 6 (six) hours as needed for pain.     allopurinol 100 MG tablet  Commonly known as:  ZYLOPRIM  Take 100 mg by mouth daily.     aspirin 81 MG EC tablet  Take 1 tablet (81 mg total) by mouth daily.     dabigatran 75 MG Caps capsule  Commonly known as:  PRADAXA  Take 75 mg by mouth every 12 (twelve) hours.     digoxin 0.125 MG tablet  Commonly known as:  LANOXIN  Take 0.125 mg by mouth every other day.     diltiazem 240 MG 24 hr capsule  Commonly known as:  CARDIZEM CD  Take 1 capsule (240 mg total) by mouth daily.     docusate sodium 100 MG capsule  Commonly known as:  COLACE  Take 1 capsule (100 mg total) by mouth 2 (two) times daily as needed for constipation.     furosemide 80 MG tablet  Commonly known as:  LASIX  Take 80 mg by mouth 2 (two) times  daily.     hydrALAZINE 50 MG tablet  Commonly known as:  APRESOLINE  Take 1 tablet (50 mg total) by mouth 2 (two) times daily.     HYDROcodone-acetaminophen 5-325 MG per tablet  Commonly known as:  NORCO/VICODIN  Take 1-2 tablets by mouth every 6 (six) hours as needed.     insulin aspart protamine- aspart (70-30) 100 UNIT/ML injection  Commonly known as:  NOVOLOG MIX 70/30  Inject 18 Units into the skin daily with breakfast. 20 units in pm with sliding scale     isosorbide dinitrate 30 MG tablet  Commonly known as:  ISORDIL  Take 1 tablet (30 mg total) by mouth 2 (two) times daily.     levothyroxine 150 MCG tablet  Commonly known as:  SYNTHROID,  LEVOTHROID  Take 150 mcg by mouth daily before breakfast.     metolazone 5 MG tablet  Commonly known as:  ZAROXOLYN  Take 1 tablet (5 mg total) by mouth daily.     metoprolol 100 MG tablet  Commonly known as:  LOPRESSOR  Take 50 mg by mouth 2 (two) times daily.     polyethylene glycol packet  Commonly known as:  MIRALAX / GLYCOLAX  Take 17 g by mouth daily as needed. For constipation     potassium chloride 10 MEQ tablet  Commonly known as:  K-DUR,KLOR-CON  Take 10 mEq by mouth 2 (two) times daily.     sulfamethoxazole-trimethoprim 800-160 MG per tablet  Commonly known as:  BACTRIM DS  Take 1 tablet by mouth 2 (two) times daily.       Follow-up Information   Follow up with Wilburt Finlay, PA-C On 02/08/2013. (2:40 pm )    Specialty:  Physician Assistant   Contact information:   [name] [place] [address] [phone]      TIME SPENT ON DISCHARGE, INCLUDING PHYSICIAN TIME: >45 MINUTES  Signed: Allayne Butcher, PA-C 02/02/2013, 12:21 PM  I saw & examined the patient on teh AM of d/c.  He has diuresed almost back to his previous dry wgt.  No back on PO meds & renal function stable.   Ready to restart anticoagulation - consider Pradaxa @ 75 mg bid until ? Of hematuria resolved, but his target  dose is 150 mg bid.  Added Hydralazine./nitrate for afterload reduction - consolidating to bid dosing.  At this point, I think he is ready for d/c - with TCM follow-up.  Needs labs checked prior to visit.   Marykay Lex, M.D., M.S. [facility] [address] [place], [place]  [address]  [phone] Pager # [phone] 02/02/2013 12:49 PM

## 2013-02-02 NOTE — Consult Note (Addendum)
Wound care follow-up: Bilat legs without any edema or weeping at this time.  Left leg with full thickness stasis ulcer 7X6X.1cm, 100% pink and moist, mod amt pink drainage, no odor. Continue present plan of care with foam dressing to protect, absorb drainage, and promote healing.  Compression wraps no longer indicated at this time.   Please re-consult if further assistance is needed.  Thank-you,  Cammie Mcgee MSN, RN, CWOCN, [place], CNS [phone]

## 2013-02-02 NOTE — Progress Notes (Addendum)
Subjective: No complaints. Continues to feel better.  Objective: Vital signs in last 24 hours: Temp:  [97.4 F (36.3 C)-98.6 F (37 C)] 98.6 F (37 C) (10/21 0532) Pulse Rate:  [70-92] 70 (10/21 0532) Resp:  [18-20] 19 (10/21 0532) BP: (102-142)/(51-82) 122/82 mmHg (10/21 0532) SpO2:  [94 %-100 %] 100 % (10/21 0532) Weight:  [235 lb 10.8 oz (106.9 kg)] 235 lb 10.8 oz (106.9 kg) (10/21 0532) Last BM Date: 01/31/13  Intake/Output from previous day: 10/20 0701 - 10/21 0700 In: 720 [P.O.:720] Out: 1001 [Urine:1000; Stool:1] Intake/Output this shift:    Medications Current Facility-Administered Medications  Medication Dose Route Frequency Provider Last Rate Last Dose  . 0.9 %  sodium chloride infusion  250 mL Intravenous PRN [name], NP 10 mL/hr at 01/29/13 0836 250 mL at 01/29/13 0836  . acetaminophen (TYLENOL) tablet 650 mg  650 mg Oral Q4H PRN [name], NP   650 mg at 02/01/13 4098  . allopurinol (ZYLOPRIM) tablet 100 mg  100 mg Oral Daily [name], NP   100 mg at 02/01/13 1191  . ALPRAZolam Prudy Feeler) tablet 0.25 mg  0.25 mg Oral BID PRN [name], NP      . aspirin EC tablet 81 mg  81 mg Oral Daily [name], NP   81 mg at 02/01/13 4782  . dabigatran (PRADAXA) capsule 75 mg  75 mg Oral Q12H [name], NP   75 mg at 02/01/13 2148  . digoxin (LANOXIN) tablet 0.125 mg  0.125 mg Oral QODAY [name], NP   0.125 mg at 02/01/13 9562  . diltiazem (CARDIZEM CD) 24 hr capsule 240 mg  240 mg Oral Daily [name], NP   240 mg at 02/01/13 1308  . docusate sodium (COLACE) capsule 100 mg  100 mg Oral BID PRN [name], NP   100 mg at 01/29/13 1536  . furosemide (LASIX) tablet 80 mg  80 mg Oral BID Abelino Derrick, PA-C   80 mg at 02/01/13 1747  . hydrALAZINE (APRESOLINE) tablet 25 mg  25 mg Oral Q8H Marykay Lex, MD   25 mg at 02/02/13 6578  . HYDROcodone-acetaminophen (NORCO/VICODIN) 5-325 MG per tablet 1-2 tablet  1-2 tablet Oral Q6H PRN [name], NP    1 tablet at 01/31/13 0834  . insulin aspart (novoLOG) injection 0-9 Units  0-9 Units Subcutaneous TID WC [name], NP   1 Units at 02/02/13 [phone]  . insulin aspart protamine- aspart (NOVOLOG MIX 70/30) injection 18 Units  18 Units Subcutaneous BID WC Chrystie Nose, MD   18 Units at 02/01/13 1746  . isosorbide dinitrate (ISORDIL) tablet 10 mg  10 mg Oral TID Marykay Lex, MD   10 mg at 02/02/13 0609  . metoprolol (LOPRESSOR) tablet 50 mg  50 mg Oral BID [name], NP   50 mg at 02/01/13 2148  . ondansetron (ZOFRAN) injection 4 mg  4 mg Intravenous Q6H PRN [name], NP      . polyethylene glycol (MIRALAX / GLYCOLAX) packet 17 g  17 g Oral Daily PRN [name], NP   17 g at 01/27/13 0733  . potassium chloride SA (K-DUR,KLOR-CON) CR tablet 20 mEq  20 mEq Oral BID Marykay Lex, MD   20 mEq at 02/01/13 2148  . sodium chloride 0.9 % injection 3 mL  3 mL Intravenous Q12H [name], NP   3 mL at 01/31/13 2128  . sodium chloride 0.9 % injection 3 mL  3 mL Intravenous PRN [name], NP      . zolpidem (AMBIEN) tablet 5 mg  5 mg Oral QHS PRN [name], NP        PE: General appearance: alert, cooperative and no distress Lungs: clear to auscultation bilaterally Heart: regular rate and rhythm, S1, S2 normal, no murmur, click, rub or gallop Extremities: no LEE Pulses: 2+ and symmetric Skin: warm and dry Neurologic: Grossly normal  Lab Results:  No results found for this basename: WBC, HGB, HCT, PLT,  in the last 72 hours BMET  Recent Labs  01/31/13 0100 02/02/13 0550  NA 136 137  K 3.5 3.5  CL 92* 94*  CO2 34* 34*  GLUCOSE 202* 150*  BUN 51* 44*  CREATININE 2.06* 1.76*  CALCIUM 9.0 9.4   BNP (last 3 results)  Recent Labs  01/27/13 0620 01/31/13 0100 02/02/13 0550  PROBNP 4713.0* 3302.0* 6692.0*    Assessment/Plan  Principal Problem:   Acute on chronic diastolic CHF (congestive heart failure) Active Problems:   Hypertension   Prostate cancer   DM2  (diabetes mellitus, type 2)   CKD (chronic kidney disease) stage 3, GFR 30-59 ml/min   LA thrombus on echo Feb 2014   Chronic Diastolic Dysfunction -- exacerbated by Afib RVR; EF ~45%   PAF (paroxysmal atrial fibrillation)   Chronic anticoagulation-Pradaxa   Normal coronary arteries at cath 2009   Obstructive sleep apnea, C-pap intol   Tachycardia-bradycardia syndrome, 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 80 mg BID. Milrinone discontinued. His weight is down an additional 4 lbs since yesterday at 235 lb. Dry weight ~230. Renal function is improving with Scr of 1.76. Continue to hold zaroxolyn. Electrolytes are WNL. BNP today is actually higher than on admission at 6,692, compared to 4,713. However, as pt is close to dry weight and renal function improving, may consider d/c later today or tomorrow. MD to assess.    LOS: 7 days   Brittainy M. Delmer Islam 02/02/2013 7:49 AM  I seen and evaluated the patient along with the Sharol Harness, PA-C. I agree with her findings, examination and recommendations. His Promus down to his dry weight based on previous recordings.  His renal function is significantly improved.  The BMP is likely not accurate simply based on his urine output.  I do think with his renal function improving on restart Zaroxolyn with his oral Lasix to ensure adequate urine output.  He is otherwise relatively stable for discharge.  He states he is just tired of being in the hospital.  He did have a less than energetic walk today, has been doing well the last couple days. Appreciate wound care assistance with dressing changes.  His compression dressings are off now.  He does need reestablishment of his outpatient wound care for his left  leg venous stasis ulcer.  I have increased his hydralazine and nitrate for afterload reduction, converting to twice a day dosing for his patient compliance.  Hydralazine 50 twice a day and Isordil 30 twice a day.  He otherwise remains on beta blocker at stable dose As his ejection fraction is not dramatically low, we are also using diltiazem for rate control of his underlying A. fib.  In addition to  digoxin.  He is anticoagulated with Pradaxa.  Appreciate pharmacy as assistance with adjusting dose to 150 mg twice a day, the reduced dose was used based on his renal insufficiency on arrival.  -- Bradycardia would not be an issue given his pacemaker. His glycemic control has been borderline.  Hopefully, get him back on his home regiment as are stabilized.  He does need a followup of his diabetes physician to assure his stable.  He will need TCM discharge with very close 5 to seven-day followup with an APP.  On the date of followup he'll need a pro-BNP and BMP.  He'll then need additional close followup with Dr. Rennis Golden.  For now I would set his dry weight at the weight that he is when he gets home. We'll instruct him on the importance of a sliding scale Lasix dose regimen.  Sliding scale Lasix: Weigh yourself when you get home, then Daily in the Morning. Your dry weight will be what your scale says on the day you return home.(here is ~235 lbs.).   If you gain more than 3 pounds from dry weight: Increase the Lasix dosing to 120 mg 2 x daily until weight returns to baseline dry weight.  If weight gain is greater than 5 pounds in 2 days: Increased to Lasix 160 mg in AM & 120 mg in PM, and contact the office for further assistance if weight does not go down the next day.  If the weight goes down more than 3 pounds from dry weight: Hold Lasix until it returns to baseline dry weight   HARDING,DAVID W, M.D., M.S. [facility] [address] [place], [place]   [address]  [phone] Pager # [phone] 02/02/2013 11:46 AM

## 2013-02-03 ENCOUNTER — Telehealth: Payer: Self-pay | Admitting: Internal Medicine

## 2013-02-03 NOTE — Telephone Encounter (Signed)
Needs to talk to nurse about wound care orders  Home from hospital yesterday

## 2013-02-03 NOTE — Telephone Encounter (Signed)
Returned [employer] call (with [facility]) - stated they are resuming care of patient post-hosp. Wanted to provide update of care of venous stasis ulcer. Had previously been treating with alginate dressing and compression wrap to decrease edema, but reported that was not edematous today and was treating ulcer with alginate and foam dressing today, and she informed patient if edema returned, they would go back to compression wraps. She stated she encouraged exercise/movement and suggested methods to decrease LE edema.

## 2013-02-04 ENCOUNTER — Telehealth: Payer: Self-pay | Admitting: Cardiology

## 2013-02-04 ENCOUNTER — Ambulatory Visit: Payer: Medicare Other | Admitting: Cardiology

## 2013-02-05 ENCOUNTER — Telehealth: Payer: Self-pay | Admitting: *Deleted

## 2013-02-05 NOTE — Telephone Encounter (Signed)
That is okay .Marland Kitchen Less than discharge. As long as his weight is stable or decreasing.  Dr. Rennis Golden

## 2013-02-05 NOTE — Telephone Encounter (Signed)
Returned Vincent [name] with [facility] call. She informed that patient's BNP is 5025, which was an increase from the last BNP they had on file on 3000-something. Informed her that upon hospital d/c BNP was in 6000's so it appears he is coming down. Informed Vincent [name] that Dr. Rennis Golden will be informed.

## 2013-02-05 NOTE — Telephone Encounter (Signed)
Jennifer from [facility] was calling in [name] BNP result 5025. Please call back. The result according to her went up since the last time.

## 2013-02-08 ENCOUNTER — Telehealth: Payer: Self-pay | Admitting: *Deleted

## 2013-02-08 ENCOUNTER — Ambulatory Visit (INDEPENDENT_AMBULATORY_CARE_PROVIDER_SITE_OTHER): Payer: Medicare Other | Admitting: Physician Assistant

## 2013-02-08 ENCOUNTER — Encounter: Payer: Self-pay | Admitting: Physician Assistant

## 2013-02-08 VITALS — BP 120/62 | HR 108 | Ht 69.5 in | Wt 224.5 lb

## 2013-02-08 DIAGNOSIS — I10 Essential (primary) hypertension: Secondary | ICD-10-CM

## 2013-02-08 DIAGNOSIS — I4891 Unspecified atrial fibrillation: Secondary | ICD-10-CM

## 2013-02-08 DIAGNOSIS — N189 Chronic kidney disease, unspecified: Secondary | ICD-10-CM

## 2013-02-08 DIAGNOSIS — I5032 Chronic diastolic (congestive) heart failure: Secondary | ICD-10-CM

## 2013-02-08 DIAGNOSIS — I480 Paroxysmal atrial fibrillation: Secondary | ICD-10-CM

## 2013-02-08 DIAGNOSIS — Z79899 Other long term (current) drug therapy: Secondary | ICD-10-CM

## 2013-02-08 DIAGNOSIS — E876 Hypokalemia: Secondary | ICD-10-CM

## 2013-02-08 MED ORDER — POTASSIUM CHLORIDE CRYS ER 20 MEQ PO TBCR: 40.0000 meq | EXTENDED_RELEASE_TABLET | Freq: Two times a day (BID) | ORAL | Status: AC

## 2013-02-08 MED ORDER — FUROSEMIDE 80 MG PO TABS: 40.0000 mg | ORAL_TABLET | Freq: Two times a day (BID) | ORAL | Status: DC

## 2013-02-08 NOTE — Patient Instructions (Signed)
Decrease lasix to 80mg  in the morning and 40mg  in the evening.  Follow up with Dr. Rennis Golden in 2 months.

## 2013-02-08 NOTE — Telephone Encounter (Addendum)
Vincent [name] w/ [facility] called in critical lab: K+ 3.0.  Stated it was faxed on Friday.  Informed Dr. Rennis Golden will be notified.  Verbalized understanding. ______________________________________________________________________________ Plan increased potassium to 40 MEQ BID.  Repeat potassium on 10/31 (Friday) this week.  Dr. Rennis Golden

## 2013-02-08 NOTE — Assessment & Plan Note (Signed)
Patient rate is mildly elevated.  No change in therapy at this time

## 2013-02-08 NOTE — Telephone Encounter (Signed)
Please have him increase potassium to 40 MEQ twice daily.  I have prescribed some 20 MEQ tablets. He should have a re-check BMP on Friday 10/31 this week.  Dr. Rennis Golden

## 2013-02-08 NOTE — Telephone Encounter (Signed)
Faxed orders to [facility] on 02/08/13

## 2013-02-08 NOTE — Telephone Encounter (Signed)
Vincent [name] with [facility] - left VM with instructions per Dr. Rennis Golden on dosing for potassium. Informed would order BMP to be checked on Fri 10/31 and asked [name] to return call if the order needed to be faxed over.

## 2013-02-08 NOTE — Progress Notes (Signed)
Date:  02/08/2013   ID:  Vincent [name], DOB 11-Jan-1936, MRN [id number]  PCP:  [name], MD  Primary Cardiologist:  Hilty    History of Present Illness: Vincent [name] is a 77 y.o. male who has a history of mild nonischemic cardiomyopathy, EF of about 45%, and chronic combined systolic and diastolic heart failure with insulin-dependent diabetes. He also has a history of PAF, on Cardizem, Lopressor and Digoxin. He takes Pradaxa for stroke prophylaxis. His PPM was inserted in February 2013.  The patient was seen by Dr. Rennis Golden in clinic on 01/26/13 for evaluation of dyspnea and weight gain. He endorsed worsening abdominal girth, lower extremity swelling, orthopnea, PND and other associated heart failure symptoms, despite being on high dose diuretic therapy, which included 80 mg of Lasix BID and 5 mg of Zaroxolyn daily. His weight in the office was 272 lb, which was well above his dry weight of 230 lbs. Outpatient labs showed an elevated BNP of 4216 and a SCr that had increased from 2.10 to 3.06 within 4 days. Dr. Rennis Golden felt that his condition could not be managed appropriately in the outpatient setting. The patient was admitted directly from the office to [facility] for IV diuretics. Zaroxolyn was discontinued and low dose Milrinone was added as an adjunct. The patient had good diuresis. He diuresed nearly 10 L. His weight decreased from 272 to 235 lbs. His symptoms resolved and his renal function improved. Scr. decreased to 1.76. Milrinone was discontinued. He was restarted on Zaroxolyn and placed back on PO Lasix. His hydralazine and nitrate were both increased for further afterload reduction. His Hydralazine was increased to 50 mg BID and Isordil to 30 mg BID. All other home meds were continued.  Presented today for followup evaluation. Patient's weight has continued to decrease from 2 and 35 pounds on October 21 and is now 224 pounds. Reports feeling well with no acute heart failure symptoms. He's  also had continued improvement in lower extremity edema.  He denies nausea, vomiting, fever, chest pain, shortness of breath, orthopnea, dizziness, PND, cough, congestion, abdominal pain, hematochezia, melena.  Wt Readings from Last 3 Encounters:  02/08/13 224 lb 8 oz (101.833 kg)  02/02/13 235 lb 10.8 oz (106.9 kg)  01/26/13 261 lb 12.8 oz (118.752 kg)     Past Medical History  Diagnosis Date  . Hypertension   . CHF (congestive heart failure)     EF 40-45% by Echo, combined Systolic & Diastolic  . Irregular heartbeat   . GERD (gastroesophageal reflux disease)   . Diastolic dysfunction, left ventricle 12/18/2011  . Atrial fibrillation with rapid ventricular response, history of PAF was in SR in 08/2011 12/18/2011  . Fecal impaction 04/13/2012  . Left atrial thrombus 12/2011    By TEE 12/2011  . CKD (chronic kidney disease) stage 3, GFR 30-59 ml/min 12/26/2011  . Pneumonia 03/31/2012; 04/13/2012    Healthcare-associated pneumonia/notes 04/13/2012  . PAF (paroxysmal atrial fibrillation) 04/15/2012    TEE in 12/2011 with LAA thrombus - no DCCV   . Type II diabetes mellitus   . Arthritis     SHOULDERS  . Symptomatic bradycardia 05/29/2012  . Tachycardia-bradycardia syndrome 05/29/2012  . Nonischemic cardiomyopathy     h/o   . Hypothyroid, recently diagnosed  01/26/2013  . Pacemaker   . Orthopnea   . Gout   . Prostate cancer     Current Outpatient Prescriptions  Medication Sig Dispense Refill  . acetaminophen (TYLENOL) 325 MG tablet Take  650 mg by mouth every 6 (six) hours as needed for pain.      Marland Kitchen allopurinol (ZYLOPRIM) 100 MG tablet Take 100 mg by mouth daily.      Marland Kitchen aspirin EC 81 MG EC tablet Take 1 tablet (81 mg total) by mouth daily.      . dabigatran (PRADAXA) 150 MG CAPS capsule Take 1 capsule (150 mg total) by mouth every 12 (twelve) hours.  60 capsule  5  . digoxin (LANOXIN) 0.125 MG tablet Take 0.125 mg by mouth every other day.      . diltiazem (CARDIZEM CD) 240 MG 24 hr  capsule Take 1 capsule (240 mg total) by mouth daily.  30 capsule  11  . docusate sodium (COLACE) 100 MG capsule Take 1 capsule (100 mg total) by mouth 2 (two) times daily as needed for constipation.  30 capsule  0  . furosemide (LASIX) 80 MG tablet Take 80 mg by mouth 2 (two) times daily.      . hydrALAZINE (APRESOLINE) 50 MG tablet Take 1 tablet (50 mg total) by mouth 2 (two) times daily.  60 tablet  5  . HYDROcodone-acetaminophen (NORCO/VICODIN) 5-325 MG per tablet Take 1-2 tablets by mouth every 6 (six) hours as needed.  20 tablet  0  . insulin aspart protamine- aspart (NOVOLOG MIX 70/30) (70-30) 100 UNIT/ML injection Inject 18 Units into the skin daily with breakfast. 20 units in pm with sliding scale      . isosorbide dinitrate (ISORDIL) 30 MG tablet Take 1 tablet (30 mg total) by mouth 2 (two) times daily.  60 tablet  5  . levothyroxine (SYNTHROID, LEVOTHROID) 150 MCG tablet Take 150 mcg by mouth daily before breakfast.      . metolazone (ZAROXOLYN) 5 MG tablet Take 1 tablet (5 mg total) by mouth daily.  30 tablet  6  . metoprolol (LOPRESSOR) 100 MG tablet Take 50 mg by mouth 2 (two) times daily.      . polyethylene glycol (MIRALAX / GLYCOLAX) packet Take 17 g by mouth daily as needed. For constipation      . potassium chloride SA (K-DUR,KLOR-CON) 20 MEQ tablet Take 2 tablets (40 mEq total) by mouth 2 (two) times daily.  120 tablet  3   No current facility-administered medications for this visit.    Allergies:   No Known Allergies  Social History:  The patient  reports that he quit smoking about 32 years ago. His smoking use included Cigarettes. He has a 25 pack-year smoking history. He has never used smokeless tobacco. He reports that he drinks alcohol. He reports that he does not use illicit drugs.   Family history:   Family History  Problem Relation Age of Onset  . Diabetes Mellitus II Mother   . Sudden death Mother   . Prostate cancer Father     ROS:  Please see the history of  present illness.  All other systems reviewed and negative.   PHYSICAL EXAM: VS:  BP 120/62  Pulse 108  Ht 5' 9.5" (1.765 m)  Wt 224 lb 8 oz (101.833 kg)  BMI 32.69 kg/m2  obese, well developed, in no acute distress HEENT: Pupils are equal round react to light accommodation extraocular movements are intact.  Neck: no JVDNo cervical lymphadenopathy. Cardiac:  irregular rate and rhythm without murmurs rubs or gallops.Rate mildly elevated  Lungs:  clear to auscultation bilaterally, no wheezing, rhonchi or rales Abd: soft, nontender, positive bowel sounds all quadrants,  Ext: no lower  extremity edema.  2+ radial and dorsalis pedis pulses. Skin: warm and dry Neuro:  Grossly normal  EKG: Atrial fibrillation with rate of 108 beats per minute  ASSESSMENT AND PLAN:  Problem List Items Addressed This Visit   PAF (paroxysmal atrial fibrillation) (Chronic)     Patient rate is mildly elevated.  No change in therapy at this time    Hypertension (Chronic)     Blood pressure is well-controlled at this time    Chronic diastolic CHF (congestive heart failure)     Patient appears to be doing quite well the heart failure standpoint and continues to lose weight.  In the little concerned that he might lose too much in volume similar to back his Lasix p.m. dose to 40 mg .      Acute on chronic renal insufficiency     Renal function from labs last Friday showed improvement in kidney function.     Other Visit Diagnoses   A-fib    -  Primary    Relevant Orders       EKG 12-Lead

## 2013-02-08 NOTE — Addendum Note (Signed)
Addended by: Lindell Spar on: 02/08/2013 12:25 PM   Modules accepted: Orders

## 2013-02-08 NOTE — Assessment & Plan Note (Signed)
Renal function from labs last Friday showed improvement in kidney function.

## 2013-02-08 NOTE — Assessment & Plan Note (Signed)
Patient appears to be doing quite well the heart failure standpoint and continues to lose weight.  In the little concerned that he might lose too much in volume similar to back his Lasix p.m. dose to 40 mg .

## 2013-02-08 NOTE — Assessment & Plan Note (Signed)
Blood pressure is well-controlled at this time. 

## 2013-02-12 ENCOUNTER — Telehealth: Payer: Self-pay | Admitting: *Deleted

## 2013-02-12 NOTE — Telephone Encounter (Signed)
Vincent [name] with [facility] called with BMET results..  BUN 68  Creat 2.15  Informed Dr. Rennis Golden would be notified.

## 2013-02-12 NOTE — Telephone Encounter (Signed)
That's ok as long as his weight is stable. Vincent [name] decreased his lasix at the last office visit. His baseline creatinine is about 2.1 - 2.2.  -Dr. Rennis Golden

## 2013-02-15 ENCOUNTER — Telehealth: Payer: Self-pay | Admitting: *Deleted

## 2013-02-15 ENCOUNTER — Telehealth: Payer: Self-pay | Admitting: Internal Medicine

## 2013-02-15 NOTE — Telephone Encounter (Signed)
Dr. Rennis Golden - Please see other phone message from today [facility]).  If instructions, please advise.

## 2013-02-15 NOTE — Telephone Encounter (Signed)
Vincent [name] with [facility] called just to report that the patient fell down ? Last night. She wanted to confirm the amount of pradaxa the patient is taking. She informs that the patient does not exibit any signs of bleeding or injury. He just complains of being "sore all over." I told [name] that I would place a note inside of the patient's chart.

## 2013-02-16 ENCOUNTER — Telehealth: Payer: Self-pay | Admitting: *Deleted

## 2013-02-16 NOTE — Telephone Encounter (Signed)
Faxed signed order to increase potassium to BID and to recheck BMET on 02/12/13 to [facility]

## 2013-02-17 ENCOUNTER — Telehealth: Payer: Self-pay | Admitting: Internal Medicine

## 2013-02-17 NOTE — Telephone Encounter (Signed)
Please call-lost a lot of fluid,he is real weak,Wonder if his medicine might need to be adjusted?

## 2013-02-17 NOTE — Telephone Encounter (Signed)
Stop Zaroxolyn. Follow up with Judie Grieve next week.  Corine Shelter PA-C 02/17/2013 4:10 PM

## 2013-02-17 NOTE — Telephone Encounter (Signed)
Returned call.  Left message to call back before 4pm.  Message forwarded to L. Diona Fanti, PA-C for further instructions.

## 2013-02-17 NOTE — Telephone Encounter (Signed)
Returned call and pt verified x 2 w/ wife, June.  Informed message received and instructions given per PA.  Verbalized understanding and agreed w/ plan.  Appt scheduled for Monday, 11.10.14 at 10:20am w/ Judie Grieve, PA-C.  Advised increase in water intake or ice chips if unable to tolerate much fluid and call back if needed.  Wife verbalized understanding and agreed w/ plan.

## 2013-02-19 ENCOUNTER — Encounter: Payer: Self-pay | Admitting: Internal Medicine

## 2013-02-19 NOTE — Telephone Encounter (Signed)
  TCM follow-up. Pt doing well. Compliant with daily weights, meds & low Na diet. No weight gain or SOB. He will have labs done on Friday and will follow-up in clinic with Wilburt Finlay, PA, on Monday 10/27. No questions or concerns.

## 2013-02-20 IMAGING — CR DG CHEST 2V
1 series · 1 of 1 positions shown · non-contrast
Comparison: 04/13/2012

CLINICAL DATA: Pacemaker placement

CHEST - 2 VIEW

[w chest lat]
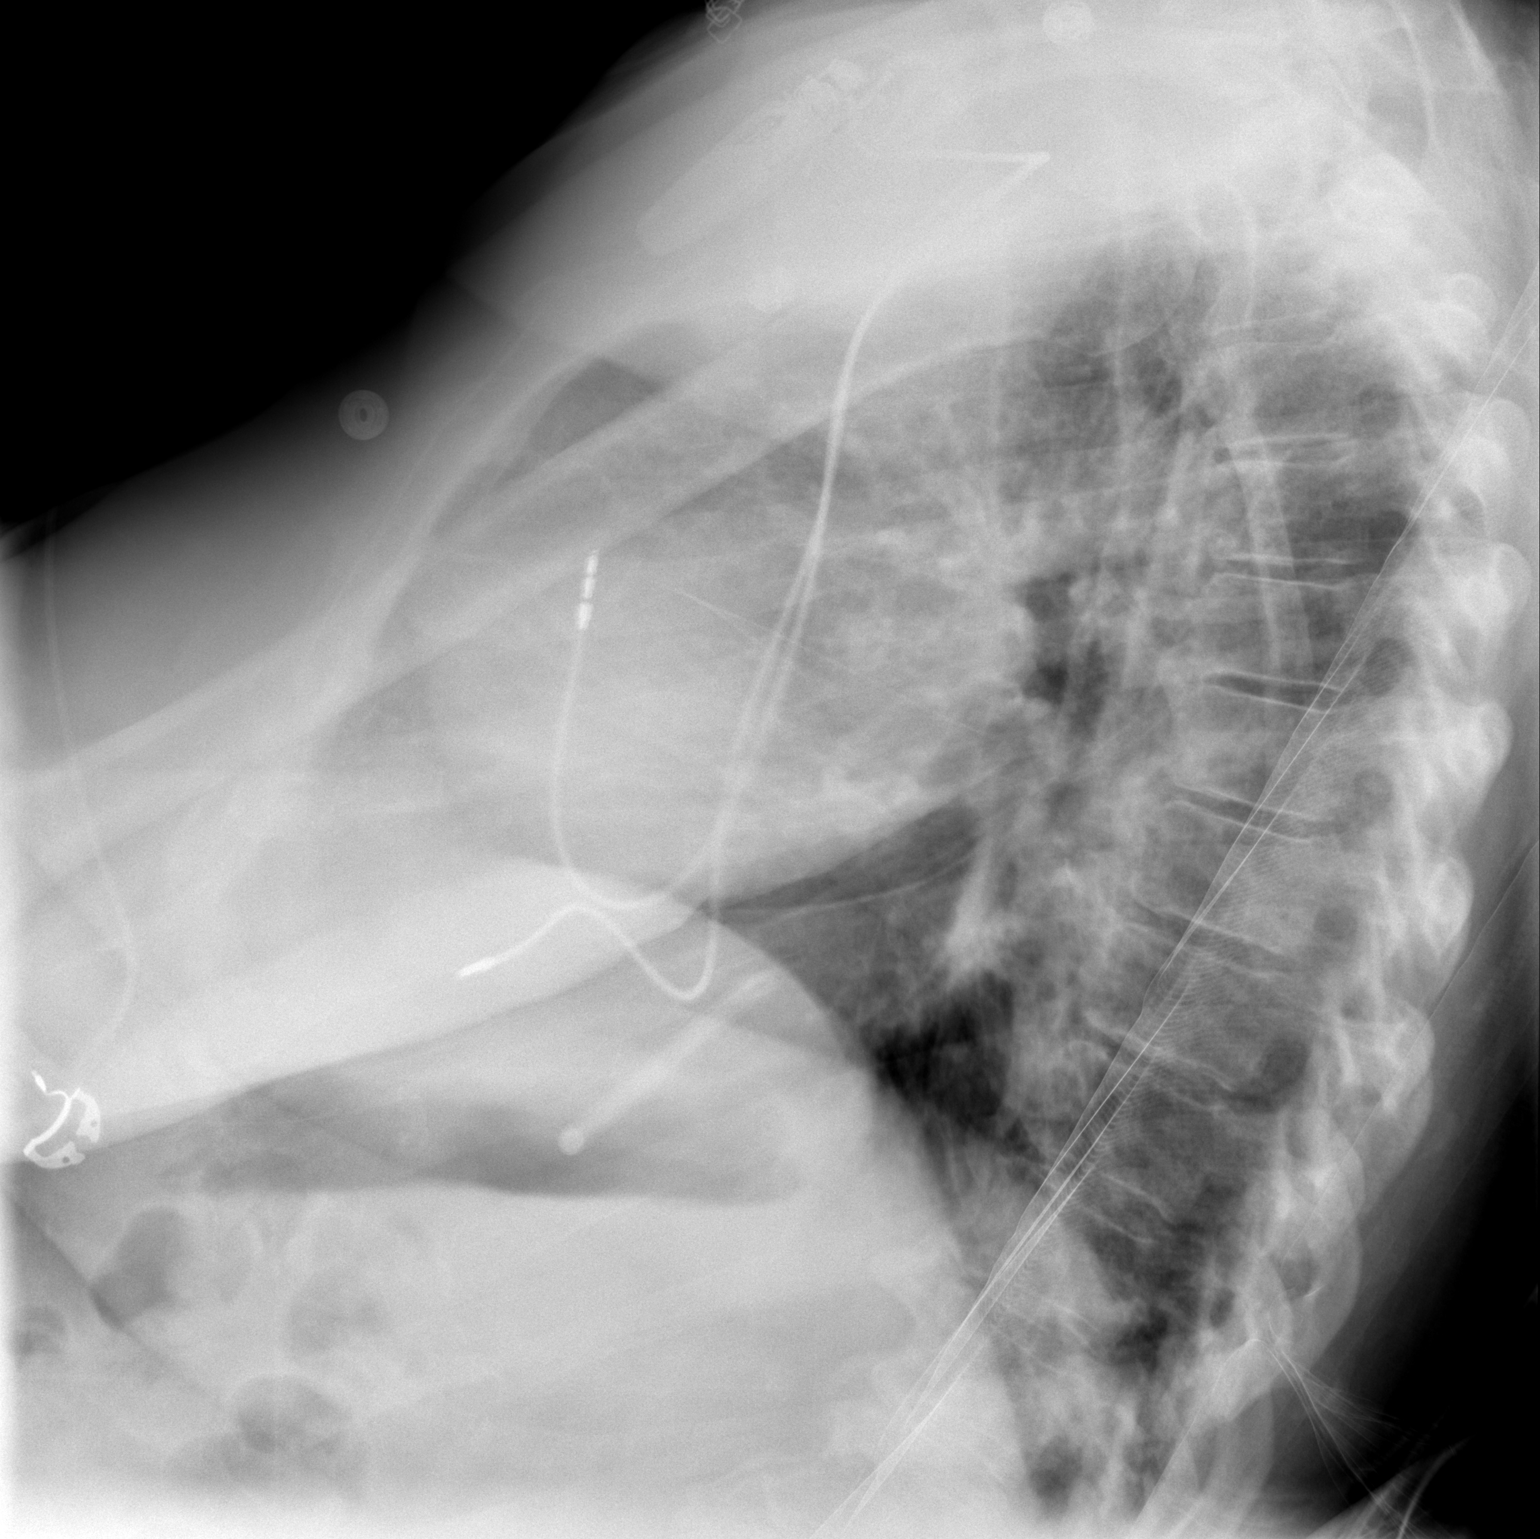

[1 of 1 positions shown; findings below may reference images not displayed]

FINDINGS: Left subclavian dual lead pacemaker device and leads are
stable.  Lungs are under aerated with linear atelectasis at the
left base.  Tips of the leads are in the right atrium and right
ventricle.  No pneumothorax.
IMPRESSION: Dual lead left subclavian pacemaker placement as described without
pneumothorax.

## 2013-02-22 ENCOUNTER — Encounter: Payer: Self-pay | Admitting: Physician Assistant

## 2013-02-22 ENCOUNTER — Ambulatory Visit (INDEPENDENT_AMBULATORY_CARE_PROVIDER_SITE_OTHER): Payer: Medicare Other | Admitting: Physician Assistant

## 2013-02-22 ENCOUNTER — Ambulatory Visit: Payer: Medicare Other | Admitting: Internal Medicine

## 2013-02-22 VITALS — BP 100/60 | HR 68 | Ht 69.0 in | Wt 211.0 lb

## 2013-02-22 DIAGNOSIS — I5042 Chronic combined systolic (congestive) and diastolic (congestive) heart failure: Secondary | ICD-10-CM

## 2013-02-22 DIAGNOSIS — E669 Obesity, unspecified: Secondary | ICD-10-CM

## 2013-02-22 DIAGNOSIS — I5032 Chronic diastolic (congestive) heart failure: Secondary | ICD-10-CM

## 2013-02-22 LAB — BASIC METABOLIC PANEL
Calcium: 9.1 mg/dL (ref 8.4–10.5)
Creat: 2.52 mg/dL — ABNORMAL HIGH (ref 0.50–1.35)
Glucose, Bld: 286 mg/dL — ABNORMAL HIGH (ref 70–99)
Potassium: 3.3 mEq/L — ABNORMAL LOW (ref 3.5–5.3)
Sodium: 136 mEq/L (ref 135–145)

## 2013-02-22 MED ORDER — FUROSEMIDE 80 MG PO TABS: 40.0000 mg | ORAL_TABLET | Freq: Two times a day (BID) | ORAL | Status: DC

## 2013-02-22 NOTE — Progress Notes (Signed)
Date:  02/22/2013   ID:  Vincent [name], DOB July 13, 1935, MRN [id number]  PCP:  [name], MD  Primary Cardiologist:  Hilty     History of Present Illness: Vincent [name] is a 77 y.o. male who has a history of mild nonischemic cardiomyopathy, EF of about 45%, and chronic combined systolic and diastolic heart failure with insulin-dependent diabetes. He also has a history of PAF, on Cardizem, Lopressor and Digoxin. He takes Pradaxa for stroke prophylaxis. His PPM was inserted in February 2013.   The patient was seen by Dr. Rennis Golden in clinic on 01/26/13 for evaluation of dyspnea and weight gain. He endorsed worsening abdominal girth, lower extremity swelling, orthopnea, PND and other associated heart failure symptoms, despite being on high dose diuretic therapy, which included 80 mg of Lasix BID and 5 mg of Zaroxolyn daily. His weight in the office was 272 lb, which was well above his dry weight of 230 lbs. Outpatient labs showed an elevated BNP of 4216 and a SCr that had increased from 2.10 to 3.06 within 4 days. Dr. Rennis Golden felt that his condition could not be managed appropriately in the outpatient setting. The patient was admitted directly from the office to [facility] for IV diuretics. Zaroxolyn was discontinued and low dose Milrinone was added as an adjunct. The patient had good diuresis. He diuresed nearly 10 L. His weight decreased from 272 to 235 lbs. His symptoms resolved and his renal function improved. Scr. decreased to 1.76. Milrinone was discontinued. He was restarted on Zaroxolyn and placed back on PO Lasix. His hydralazine and nitrate were both increased for further afterload reduction. His Hydralazine was increased to 50 mg BID and Isordil to 30 mg BID. All other home meds were continued.  I saw him in the office on 10/27 and his weight was continuing to decrease.  At that time I decreased his lasix to 80mg  QAM and 40mg  QPM. His Zaroxolyn was discontinued on the 5th.  As of today, his weight  decreased another 13#.  He reports feeling dizzy and fell a weak ago after getting out of bed.  He had some nausea last Friday but otherwise denies vomiting, fever, chest pain, shortness of breath, orthopnea, dizziness, PND, cough, congestion, abdominal pain, hematochezia, melena, lower extremity edema, claudication.  Wt Readings from Last 3 Encounters:  02/22/13 211 lb (95.709 kg)  02/08/13 224 lb 8 oz (101.833 kg)  02/02/13 235 lb 10.8 oz (106.9 kg)     Past Medical History  Diagnosis Date  . Hypertension   . CHF (congestive heart failure)     EF 40-45% by Echo, combined Systolic & Diastolic  . Irregular heartbeat   . GERD (gastroesophageal reflux disease)   . Diastolic dysfunction, left ventricle 12/18/2011  . Atrial fibrillation with rapid ventricular response, history of PAF was in SR in 08/2011 12/18/2011  . Fecal impaction 04/13/2012  . Left atrial thrombus 12/2011    By TEE 12/2011  . CKD (chronic kidney disease) stage 3, GFR 30-59 ml/min 12/26/2011  . Pneumonia 03/31/2012; 04/13/2012    Healthcare-associated pneumonia/notes 04/13/2012  . PAF (paroxysmal atrial fibrillation) 04/15/2012    TEE in 12/2011 with LAA thrombus - no DCCV   . Type II diabetes mellitus   . Arthritis     SHOULDERS  . Symptomatic bradycardia 05/29/2012  . Tachycardia-bradycardia syndrome 05/29/2012  . Nonischemic cardiomyopathy     h/o   . Hypothyroid, recently diagnosed  01/26/2013  . Pacemaker   . Orthopnea   .  Gout   . Prostate cancer     Current Outpatient Prescriptions  Medication Sig Dispense Refill  . acetaminophen (TYLENOL) 325 MG tablet Take 650 mg by mouth every 6 (six) hours as needed for pain.      Marland Kitchen allopurinol (ZYLOPRIM) 100 MG tablet Take 100 mg by mouth daily.      Marland Kitchen aspirin EC 81 MG EC tablet Take 1 tablet (81 mg total) by mouth daily.      . dabigatran (PRADAXA) 150 MG CAPS capsule Take 1 capsule (150 mg total) by mouth every 12 (twelve) hours.  60 capsule  5  . digoxin (LANOXIN) 0.125  MG tablet Take 0.125 mg by mouth every other day.      . diltiazem (CARDIZEM CD) 240 MG 24 hr capsule Take 1 capsule (240 mg total) by mouth daily.  30 capsule  11  . docusate sodium (COLACE) 100 MG capsule Take 1 capsule (100 mg total) by mouth 2 (two) times daily as needed for constipation.  30 capsule  0  . furosemide (LASIX) 80 MG tablet Take 0.5 tablets (40 mg total) by mouth 2 (two) times daily.      . hydrALAZINE (APRESOLINE) 50 MG tablet Take 1 tablet (50 mg total) by mouth 2 (two) times daily.  60 tablet  5  . HYDROcodone-acetaminophen (NORCO/VICODIN) 5-325 MG per tablet Take 1-2 tablets by mouth every 6 (six) hours as needed.  20 tablet  0  . insulin aspart protamine- aspart (NOVOLOG MIX 70/30) (70-30) 100 UNIT/ML injection Inject 18 Units into the skin daily with breakfast. 20 units in pm with sliding scale      . isosorbide dinitrate (ISORDIL) 30 MG tablet Take 1 tablet (30 mg total) by mouth 2 (two) times daily.  60 tablet  5  . levothyroxine (SYNTHROID, LEVOTHROID) 150 MCG tablet Take 150 mcg by mouth daily before breakfast.      . metoprolol (LOPRESSOR) 100 MG tablet Take 50 mg by mouth 2 (two) times daily.      . polyethylene glycol (MIRALAX / GLYCOLAX) packet Take 17 g by mouth daily as needed. For constipation      . potassium chloride SA (K-DUR,KLOR-CON) 20 MEQ tablet Take 2 tablets (40 mEq total) by mouth 2 (two) times daily.  120 tablet  3   No current facility-administered medications for this visit.    Allergies:   No Known Allergies  Social History:  The patient  reports that he quit smoking about 32 years ago. His smoking use included Cigarettes. He has a 25 pack-year smoking history. He has never used smokeless tobacco. He reports that he drinks alcohol. He reports that he does not use illicit drugs.   Family history:   Family History  Problem Relation Age of Onset  . Diabetes Mellitus II Mother   . Sudden death Mother   . Prostate cancer Father     ROS:  Please  see the history of present illness.  All other systems reviewed and negative.   PHYSICAL EXAM: VS:  BP 100/60  Pulse 68  Ht 5\' 9"  (1.753 m)  Wt 211 lb (95.709 kg)  BMI 31.15 kg/m2 Obese, well developed, in no acute distress HEENT: Pupils are equal round react to light accommodation extraocular movements are intact.  Neck: no JVDNo cervical lymphadenopathy. Cardiac: Regular rate and rhythm without murmurs rubs or gallops. Lungs:  clear to auscultation bilaterally, no wheezing, rhonchi or rales Abd: soft, nontender, positive bowel sounds all quadrants, no hepatosplenomegaly Ext:  no lower extremity edema.  2+ radial and dorsalis pedis pulses. Skin: warm and dry Neuro:  Grossly normal   ASSESSMENT AND PLAN:  Problem List Items Addressed This Visit   Obesity (BMI 30-39.9)   Chronic Diastolic Dysfunction -- exacerbated by Afib RVR; EF ~45% (Chronic)     The patient appears to be dehydrated.  He is orthostatic and dizzy when he stands up.  I have asked him to stop the lasix for today and tomorrow and restart on Wednesday at 40mg  BID.  No more zaroxolyn.  He have a BMET drawn on the way out.  Continue to weigh daily and call if there is a change.  Laying 104/64, Sit: 102/56, Stand: 86/52    Relevant Medications      furosemide (LASIX) tablet    Other Visit Diagnoses   Chronic combined systolic and diastolic HF (heart failure)    -  Primary    Relevant Orders       Basic Metabolic Panel (BMET)

## 2013-02-22 NOTE — Assessment & Plan Note (Addendum)
The patient appears to be dehydrated.  He is orthostatic and dizzy when he stands up.  I have asked him to stop the lasix for today and tomorrow and restart on Wednesday at 40mg  BID.  No more zaroxolyn.  He have a BMET drawn on the way out.  Continue to weigh daily and call if there is a change.  Laying 104/64, Sit: 102/56, Stand: 86/52

## 2013-02-22 NOTE — Patient Instructions (Signed)
1.  Hold lasix today and tomorrow and restart on Wednesday, 40mg  twice a day. 2.  Continue to weigh daily.  If your weight continues to decrease hold the lasix and call the office for instructions. 3.  Follow up with Dr. Rennis Golden in three months or sooner if need.

## 2013-02-23 ENCOUNTER — Telehealth: Payer: Self-pay | Admitting: Internal Medicine

## 2013-02-23 NOTE — Telephone Encounter (Signed)
She needs to get clarification on Pradaxa.  Please call.

## 2013-02-23 NOTE — Telephone Encounter (Signed)
Returned call to [facility] w/ [facility].  Stated she just wants to confirm pt's Pradaxa dose is at 150 mg twice a day.  Informed that is what is on file since discharge 10.21.14.  Stated pt's wife is concerned and has been on a weekly basis and she wanted to make sure that is what is on file.

## 2013-03-04 DIAGNOSIS — N189 Chronic kidney disease, unspecified: Secondary | ICD-10-CM

## 2013-03-04 DIAGNOSIS — N289 Disorder of kidney and ureter, unspecified: Secondary | ICD-10-CM

## 2013-03-04 DIAGNOSIS — I509 Heart failure, unspecified: Secondary | ICD-10-CM

## 2013-03-29 ENCOUNTER — Telehealth: Payer: Self-pay | Admitting: *Deleted

## 2013-03-29 NOTE — Telephone Encounter (Signed)
Pt was calling in regards to him gaining weight and the fluid pills

## 2013-03-29 NOTE — Telephone Encounter (Signed)
Returned call and pt verified x 2.  Pt stated he has gained 5 lbs since Friday.  Also c/o hands and legs being swollen.  Denied problems breathing.  Pt wants to know if he can increase the Lasix.  RN asked pt if he is taking whole tab (80 mg) in AM and 1/2 tab in evening (40 mg) and pt stated someone else does his medicines b/c he has cataracts.  Pt called Corrie Dandy to the phone.  Corrie Dandy stated she doesn't do pt's meds, but stated his papers say he take furosemide 40mg  and pill bottle says 80 mg twice a day.  RN asked Corrie Dandy to look in pt's pill box for tomorrow's AM dose and PM dose to see how much of the furosemide pill is in each compartment.  Stated there is a whole pill in the AM and 1/2 pill in PM.  Corrie Dandy also stated pt w/ swelling in hands and feet and had a 12 lbs weight gain since Friday.  Stated pt weighed 212 lbs on Friday and 225 lbs.  Per Corrie Dandy, wt not checked at the same time every day, but it is before pt eats/drinks anything.  Dr. Rennis Golden notified and advised pt come in tomorrow for evaluation.  Add-on w/ him at 4:15pm.  Also advised pt take 80 mg furosemide this PM.    Call to pt and spoke w/ Corrie Dandy.  Informed of instructions by MD.  Verbalized understanding and took 1/2 tab out for this evening, replaced w/ whole tab (80 mg).  Also stated she will likely be at appt w/ pt tomorrow.  Tina in Scheduling notified and added pt on to Dr. Blanchie Dessert schedule.

## 2013-03-30 ENCOUNTER — Ambulatory Visit (INDEPENDENT_AMBULATORY_CARE_PROVIDER_SITE_OTHER): Payer: Medicare Other | Admitting: Internal Medicine

## 2013-03-30 ENCOUNTER — Encounter: Payer: Self-pay | Admitting: Internal Medicine

## 2013-03-30 VITALS — BP 130/58 | HR 72 | Ht 69.5 in | Wt 228.5 lb

## 2013-03-30 DIAGNOSIS — I5032 Chronic diastolic (congestive) heart failure: Secondary | ICD-10-CM

## 2013-03-30 DIAGNOSIS — R0609 Other forms of dyspnea: Secondary | ICD-10-CM

## 2013-03-30 DIAGNOSIS — R609 Edema, unspecified: Secondary | ICD-10-CM

## 2013-03-30 DIAGNOSIS — R600 Localized edema: Secondary | ICD-10-CM

## 2013-03-30 MED ORDER — FUROSEMIDE 40 MG PO TABS: 40.0000 mg | ORAL_TABLET | Freq: Two times a day (BID) | ORAL | Status: DC

## 2013-03-30 NOTE — Patient Instructions (Addendum)
START taking Furosemide 40 mg TWICE DAILY.  Dr Rennis Golden has ordered some lab work to be done.  Weigh daily.   Call Eileen Stanford, Dr [facility] nurse, tomorrow to let her know what your weight is. Our phone number is [phone].

## 2013-03-30 NOTE — Progress Notes (Signed)
Date:  03/30/2013   ID:  [name], DOB [birth year], MRN [id number]  PCP:  [name], MD  Primary Cardiologist:  [place]     History of Present Illness: Vincent [name] is a 77 y.o. male who has a history of mild nonischemic cardiomyopathy, EF of about 45%, and chronic combined systolic and diastolic heart failure with insulin-dependent diabetes. He also has a history of PAF, on Cardizem, Lopressor and Digoxin. He takes Pradaxa for stroke prophylaxis. His PPM was inserted in February 2013.   The patient was seen by Dr. Rennis Golden in clinic on 01/26/13 for evaluation of dyspnea and weight gain. He endorsed worsening abdominal girth, lower extremity swelling, orthopnea, PND and other associated heart failure symptoms, despite being on high dose diuretic therapy, which included 80 mg of Lasix BID and 5 mg of Zaroxolyn daily. His weight in the office was 272 lb, which was well above his dry weight of 230 lbs. Outpatient labs showed an elevated BNP of 4216 and a SCr that had increased from 2.10 to 3.06 within 4 days. Dr. Rennis Golden felt that his condition could not be managed appropriately in the outpatient setting. The patient was admitted directly from the office to [facility] for IV diuretics. Zaroxolyn was discontinued and low dose Milrinone was added as an adjunct. The patient had good diuresis. He diuresed nearly 10 L. His weight decreased from 272 to 235 lbs. His symptoms resolved and his renal function improved. Scr. decreased to 1.76. Milrinone was discontinued. He was restarted on Zaroxolyn and placed back on PO Lasix. His hydralazine and nitrate were both increased for further afterload reduction. His Hydralazine was increased to 50 mg BID and Isordil to 30 mg BID. All other home meds were continued.  He was seen by Wilburt Finlay, PA-C in the office on 10/27 and his weight was continuing to decrease.  At that time, his lasix was decreased to 80mg  QAM and 40mg  QPM. He continues to take zaroxyln.  He is  home health aide reports that he's gained about 12 pounds over the past 4-5 days. Indeed, his weight is up to 228 today from 211 in November.  Does note some decreased urination. After some discussion it was clear that Vincent [name] has much difficulty seeing including bilateral cataracts and does not arrange his own medications.  His wife has been arranging his medications however she became acutely ill recently and was hospitalized. Apparently she was on dialysis and had a cardiac arrest. Fortunately she has survived, however he is not getting care at home. After going through his pillbox in the office today, it was clear that he had both a mix of 40 and 80 mg tablets. There were a number of half a size 40 and 80 mg tablets, so it is difficult to understand what dose he is taking on a daily basis. This is probably contributing to his weight gain.  Wt Readings from Last 3 Encounters:  02/22/13 211 lb (95.709 kg)  02/08/13 224 lb 8 oz (101.833 kg)  02/02/13 235 lb 10.8 oz (106.9 kg)     Past Medical History  Diagnosis Date  . Hypertension   . CHF (congestive heart failure)     EF 40-45% by Echo, combined Systolic & Diastolic  . Irregular heartbeat   . GERD (gastroesophageal reflux disease)   . Diastolic dysfunction, left ventricle 12/18/2011  . Atrial fibrillation with rapid ventricular response, history of PAF was in SR in 08/2011 12/18/2011  . Fecal  impaction 04/13/2012  . Left atrial thrombus 12/2011    By TEE 12/2011  . CKD (chronic kidney disease) stage 3, GFR 30-59 ml/min 12/26/2011  . Pneumonia 03/31/2012; 04/13/2012    Healthcare-associated pneumonia/notes 04/13/2012  . PAF (paroxysmal atrial fibrillation) 04/15/2012    TEE in 12/2011 with LAA thrombus - no DCCV   . Type II diabetes mellitus   . Arthritis     SHOULDERS  . Symptomatic bradycardia 05/29/2012  . Tachycardia-bradycardia syndrome 05/29/2012  . Nonischemic cardiomyopathy     h/o   . Hypothyroid, recently diagnosed  01/26/2013  .  Pacemaker   . Orthopnea   . Gout   . Prostate cancer     Current Outpatient Prescriptions  Medication Sig Dispense Refill  . acetaminophen (TYLENOL) 325 MG tablet Take 650 mg by mouth every 6 (six) hours as needed for pain.      Marland Kitchen allopurinol (ZYLOPRIM) 100 MG tablet Take 100 mg by mouth daily.      Marland Kitchen aspirin EC 81 MG EC tablet Take 1 tablet (81 mg total) by mouth daily.      . dabigatran (PRADAXA) 150 MG CAPS capsule Take 1 capsule (150 mg total) by mouth every 12 (twelve) hours.  60 capsule  5  . digoxin (LANOXIN) 0.125 MG tablet Take 0.125 mg by mouth every other day.      . diltiazem (CARDIZEM CD) 240 MG 24 hr capsule Take 1 capsule (240 mg total) by mouth daily.  30 capsule  11  . docusate sodium (COLACE) 100 MG capsule Take 1 capsule (100 mg total) by mouth 2 (two) times daily as needed for constipation.  30 capsule  0  . furosemide (LASIX) 80 MG tablet Take 0.5 tablets (40 mg total) by mouth 2 (two) times daily.      . hydrALAZINE (APRESOLINE) 50 MG tablet Take 1 tablet (50 mg total) by mouth 2 (two) times daily.  60 tablet  5  . HYDROcodone-acetaminophen (NORCO/VICODIN) 5-325 MG per tablet Take 1-2 tablets by mouth every 6 (six) hours as needed.  20 tablet  0  . insulin aspart protamine- aspart (NOVOLOG MIX 70/30) (70-30) 100 UNIT/ML injection Inject 18 Units into the skin daily with breakfast. 20 units in pm with sliding scale      . isosorbide dinitrate (ISORDIL) 30 MG tablet Take 1 tablet (30 mg total) by mouth 2 (two) times daily.  60 tablet  5  . levothyroxine (SYNTHROID, LEVOTHROID) 150 MCG tablet Take 150 mcg by mouth daily before breakfast.      . metoprolol (LOPRESSOR) 100 MG tablet Take 50 mg by mouth 2 (two) times daily.      . polyethylene glycol (MIRALAX / GLYCOLAX) packet Take 17 g by mouth daily as needed. For constipation      . potassium chloride SA (K-DUR,KLOR-CON) 20 MEQ tablet Take 2 tablets (40 mEq total) by mouth 2 (two) times daily.  120 tablet  3   No current  facility-administered medications for this visit.    Allergies:   No Known Allergies  Social History:  The patient  reports that he quit smoking about 32 years ago. His smoking use included Cigarettes. He has a 25 pack-year smoking history. He has never used smokeless tobacco. He reports that he drinks alcohol. He reports that he does not use illicit drugs.   Family history:   Family History  Problem Relation Age of Onset  . Diabetes Mellitus II Mother   . Sudden death Mother   .  Prostate cancer Father     ROS:  Hand and feet swelling, weight gain, dizziness  PHYSICAL EXAM: VS:  There were no vitals taken for this visit. Obese, well developed, in no acute distress HEENT: Pupils are equal round react to light accommodation extraocular movements are intact.  Neck: JVP elevated to 3 cm H20,No cervical lymphadenopathy. Cardiac: Regular rate and rhythm without murmurs rubs or gallops. Lungs:  clear to auscultation bilaterally, no wheezing, rhonchi or rales Abd: soft, nontender, positive bowel sounds all quadrants, no hepatosplenomegaly Ext: 1+ bilateral pitting edema, trace hand edema, 2+ radial and dorsalis pedis pulses. Skin: warm and dry Neuro:  Grossly normal  ASSESSMENT AND PLAN: Patient Active Problem List   Diagnosis Date Noted  . Obesity (BMI 30-39.9) 02/22/2013  . Non ischemic cardiomyopathy- EF 40-45% 2D Feb 2014 01/31/2013  . Acute on chronic renal insufficiency 01/31/2013  . Shortness of breath 01/26/2013  . Back pain, lumbosacral, since recent fall 01/26/2013  . Hypothyroid, recently diagnosed  01/26/2013  . Venous stasis ulcer of ankle 01/12/2013  . Tachycardia-bradycardia syndrome, s/p [employer] PTVDP Feb 2014 05/29/2012  . Obstructive sleep apnea, C-pap intol 05/26/2012  . Chronic anticoagulation-Pradaxa 04/17/2012  . Normal coronary arteries at cath 2009 04/17/2012  . PAF (paroxysmal atrial fibrillation) 04/15/2012  . Generalized weakness 04/14/2012  .  Constipation 04/14/2012  . PNA (pneumonia) 04/07/2012  . UTI (lower urinary tract infection) 04/02/2012  . Normocytic anemia 04/02/2012  . Nausea and vomiting 03/31/2012  . Healthcare-associated pneumonia 03/31/2012  . Chronic Diastolic Dysfunction -- exacerbated by Afib RVR; EF ~45% 03/31/2012  . LA thrombus on echo Feb 2014 12/27/2011  . CKD (chronic kidney disease) stage 3, GFR 30-59 ml/min 12/26/2011  . Bacteremia 12/20/2011  . Chronic diastolic CHF (congestive heart failure) 12/18/2011  . DM2 (diabetes mellitus, type 2) 12/18/2011  . Leukocytosis 12/18/2011  . Hypertension   . Prostate cancer    PLAN: 1.  Mr. Patella has had significant weight gain over the past week, possibly due to inaccurate medications. It is difficult to discern what Lasix dose he is taking. I recommend putting him on Lasix 40 mg twice daily and continuing Zaroxolyn. I went ahead and removed the half tablet pills from his box and repopulated his pillbox with 40 mg tablets. I've asked his caretaker to call the office back tomorrow to see if his weight is coming down. If his weight continues to rise, I would increase his Lasix to 80 mg twice daily and plan for a recheck of his weight on Friday with our office. He is also scheduled to get repeat blood work on Friday including a CMP and BNP.  If by Friday his weight continues to rise and his BNP is markedly elevated or his creatinine is worsening, he may need to be admitted for IV diuresis.  Because his wife is in the hospital for now, he would prefer to try to manage this as an outpatient. I think this is possible, since he isn't severely volume overloaded as he was during his last hospitalization where his weight was 275 pounds on admission and he diuresis close to 40 pounds.  Chrystie Nose, MD, [facility] Attending Cardiologist [facility]

## 2013-03-31 ENCOUNTER — Encounter: Payer: Self-pay | Admitting: Internal Medicine

## 2013-03-31 ENCOUNTER — Telehealth: Payer: Self-pay | Admitting: Internal Medicine

## 2013-03-31 NOTE — Telephone Encounter (Signed)
Message forwarded to J. Elkins, RN.  

## 2013-03-31 NOTE — Telephone Encounter (Signed)
Called patient with instructions per Dr. Rennis Golden regarding weight. Increase furosemide to 80mg  BID, have bloodwork on Friday and call our office on Friday with weight. Provided this information to patient's brother in law, as patient cannot see to write down instructions. Patient will be informed.

## 2013-03-31 NOTE — Telephone Encounter (Signed)
Dr Rennis Golden wanted [place] to call back with his weight this morning,it is 230.6.

## 2013-04-02 ENCOUNTER — Telehealth: Payer: Self-pay | Admitting: Internal Medicine

## 2013-04-02 LAB — BASIC METABOLIC PANEL
CO2: 27 mEq/L (ref 19–32)
Calcium: 8.5 mg/dL (ref 8.4–10.5)
Chloride: 100 mEq/L (ref 96–112)
Creat: 1.71 mg/dL — ABNORMAL HIGH (ref 0.50–1.35)
Glucose, Bld: 186 mg/dL — ABNORMAL HIGH (ref 70–99)
Sodium: 142 mEq/L (ref 135–145)

## 2013-04-02 NOTE — Telephone Encounter (Signed)
Dr. Rennis Golden notified - waiting to see what blood work shows

## 2013-04-02 NOTE — Telephone Encounter (Signed)
Message forwarded to J. Elkins, RN.  

## 2013-04-02 NOTE — Telephone Encounter (Signed)
Pt weight for today is  228.4. He is suppose to report his weight today

## 2013-04-03 LAB — BRAIN NATRIURETIC PEPTIDE: Brain Natriuretic Peptide: 302.6 pg/mL — ABNORMAL HIGH (ref 0.0–100.0)

## 2013-04-12 ENCOUNTER — Ambulatory Visit: Payer: Medicare Other | Admitting: Internal Medicine

## 2013-04-14 ENCOUNTER — Encounter: Payer: Self-pay | Admitting: Internal Medicine

## 2013-04-14 ENCOUNTER — Ambulatory Visit (INDEPENDENT_AMBULATORY_CARE_PROVIDER_SITE_OTHER): Payer: Medicare Other | Admitting: Internal Medicine

## 2013-04-14 VITALS — BP 120/70 | HR 68 | Ht 69.5 in | Wt 218.9 lb

## 2013-04-14 DIAGNOSIS — I10 Essential (primary) hypertension: Secondary | ICD-10-CM

## 2013-04-14 DIAGNOSIS — L97309 Non-pressure chronic ulcer of unspecified ankle with unspecified severity: Secondary | ICD-10-CM

## 2013-04-14 DIAGNOSIS — E669 Obesity, unspecified: Secondary | ICD-10-CM

## 2013-04-14 DIAGNOSIS — I480 Paroxysmal atrial fibrillation: Secondary | ICD-10-CM

## 2013-04-14 DIAGNOSIS — I4891 Unspecified atrial fibrillation: Secondary | ICD-10-CM

## 2013-04-14 DIAGNOSIS — I422 Other hypertrophic cardiomyopathy: Secondary | ICD-10-CM

## 2013-04-14 DIAGNOSIS — I5032 Chronic diastolic (congestive) heart failure: Secondary | ICD-10-CM

## 2013-04-14 DIAGNOSIS — I83003 Varicose veins of unspecified lower extremity with ulcer of ankle: Secondary | ICD-10-CM

## 2013-04-14 DIAGNOSIS — I509 Heart failure, unspecified: Secondary | ICD-10-CM

## 2013-04-14 DIAGNOSIS — I495 Sick sinus syndrome: Secondary | ICD-10-CM

## 2013-04-14 NOTE — Patient Instructions (Signed)
Dr Rennis Golden wants you to follow-up in 3 months.  You will receive a reminder letter in the mail two months in advance. If you don't receive a letter, please call our office to schedule the follow-up appointment.

## 2013-04-14 NOTE — Progress Notes (Signed)
Date:  04/14/2013   ID:  Vincent [name], DOB 08/12/1935, MRN [id number]  PCP:  [name], MD  Primary Cardiologist:  [place]     History of Present Illness: Vincent [name] is a 77 y.o. male who has a history of mild nonischemic cardiomyopathy, EF of about 45%, and chronic combined systolic and diastolic heart failure with insulin-dependent diabetes. He also has a history of PAF, on Cardizem, Lopressor and Digoxin. He takes Pradaxa for stroke prophylaxis. His PPM was inserted in February 2013.   The patient was seen by Dr. Rennis Golden in clinic on 01/26/13 for evaluation of dyspnea and weight gain. He endorsed worsening abdominal girth, lower extremity swelling, orthopnea, PND and other associated heart failure symptoms, despite being on high dose diuretic therapy, which included 80 mg of Lasix BID and 5 mg of Zaroxolyn daily. His weight in the office was 272 lb, which was well above his dry weight of 230 lbs. Outpatient labs showed an elevated BNP of 4216 and a SCr that had increased from 2.10 to 3.06 within 4 days. Dr. Rennis Golden felt that his condition could not be managed appropriately in the outpatient setting. The patient was admitted directly from the office to [facility] for IV diuretics. Zaroxolyn was discontinued and low dose Milrinone was added as an adjunct. The patient had good diuresis. He diuresed nearly 10 L. His weight decreased from 272 to 235 lbs. His symptoms resolved and his renal function improved. Scr. decreased to 1.76. Milrinone was discontinued. He was restarted on Zaroxolyn and placed back on PO Lasix. His hydralazine and nitrate were both increased for further afterload reduction. His Hydralazine was increased to 50 mg BID and Isordil to 30 mg BID. All other home meds were continued.  He was seen by Wilburt Finlay, PA-C in the office on 10/27 and his weight was continuing to decrease.  At that time, his lasix was decreased to 80mg  QAM and 40mg  QPM. He continues to take zaroxyln.  He is  home health aide reports that he's gained about 12 pounds over the past 4-5 days. Indeed, his weight is up to 228 today from 211 in November.  Does note some decreased urination. After some discussion it was clear that [name] has much difficulty seeing including bilateral cataracts and does not arrange his own medications.  His wife has been arranging his medications however she became acutely ill recently and was hospitalized. Apparently she was on dialysis and had a cardiac arrest. Fortunately she has survived, however he is not getting care at home. After going through his pillbox in the office today, it was clear that he had both a mix of 40 and 80 mg tablets. There were a number of half a size 40 and 80 mg tablets, so it is difficult to understand what dose he is taking on a daily basis. This is probably contributing to his weight gain.  At his last office visit I rearranged his medications so that he was on 40 mg twice daily of Lasix. He reported continued weight increase for 2 days after that visit therefore I increased his Lasix to 80 mg twice daily. Subsequently he had weight loss and has come back down to a resting weight of 218 today. He actually looks great without any significant lower extremity swelling and was able to walk into the office today unassisted with a walker.  Wt Readings from Last 3 Encounters:  04/14/13 218 lb 14.4 oz (99.292 kg)  03/30/13 228  lb 8 oz (103.647 kg)  02/22/13 211 lb (95.709 kg)     Past Medical History  Diagnosis Date  . Hypertension   . CHF (congestive heart failure)     EF 40-45% by Echo, combined Systolic & Diastolic  . Irregular heartbeat   . GERD (gastroesophageal reflux disease)   . Diastolic dysfunction, left ventricle 12/18/2011  . Atrial fibrillation with rapid ventricular response, history of PAF was in SR in 08/2011 12/18/2011  . Fecal impaction 04/13/2012  . Left atrial thrombus 12/2011    By TEE 12/2011  . CKD (chronic kidney disease) stage  3, GFR 30-59 ml/min 12/26/2011  . Pneumonia 03/31/2012; 04/13/2012    Healthcare-associated pneumonia/notes 04/13/2012  . PAF (paroxysmal atrial fibrillation) 04/15/2012    TEE in 12/2011 with LAA thrombus - no DCCV   . Type II diabetes mellitus   . Arthritis     SHOULDERS  . Symptomatic bradycardia 05/29/2012  . Tachycardia-bradycardia syndrome 05/29/2012  . Nonischemic cardiomyopathy     h/o   . Hypothyroid, recently diagnosed  01/26/2013  . Pacemaker   . Orthopnea   . Gout   . Prostate cancer     Current Outpatient Prescriptions  Medication Sig Dispense Refill  . acetaminophen (TYLENOL) 325 MG tablet Take 650 mg by mouth every 6 (six) hours as needed for pain.      Marland Kitchen allopurinol (ZYLOPRIM) 100 MG tablet Take 100 mg by mouth daily.      . Ascorbic Acid (VITAMIN C PO) Take by mouth daily.      . dabigatran (PRADAXA) 150 MG CAPS capsule Take 1 capsule (150 mg total) by mouth every 12 (twelve) hours.  60 capsule  5  . digoxin (LANOXIN) 0.125 MG tablet Take 0.125 mg by mouth every other day.      . diltiazem (CARDIZEM CD) 240 MG 24 hr capsule Take 1 capsule (240 mg total) by mouth daily.  30 capsule  11  . docusate sodium (COLACE) 100 MG capsule Take 1 capsule (100 mg total) by mouth 2 (two) times daily as needed for constipation.  30 capsule  0  . doxycycline (VIBRA-TABS) 100 MG tablet Take 100 mg by mouth daily.      . furosemide (LASIX) 40 MG tablet Take 80 mg by mouth 2 (two) times daily.      . hydrALAZINE (APRESOLINE) 50 MG tablet Take 1 tablet (50 mg total) by mouth 2 (two) times daily.  60 tablet  5  . insulin aspart protamine- aspart (NOVOLOG MIX 70/30) (70-30) 100 UNIT/ML injection Inject 20 Units into the skin every evening. Sliding scale every morning      . isosorbide dinitrate (ISORDIL) 30 MG tablet Take 1 tablet (30 mg total) by mouth 2 (two) times daily.  60 tablet  5  . metolazone (ZAROXOLYN) 5 MG tablet Take 5 mg by mouth daily.      . metoprolol (LOPRESSOR) 100 MG tablet  Take 50 mg by mouth 2 (two) times daily.      . polyethylene glycol (MIRALAX / GLYCOLAX) packet Take 17 g by mouth daily as needed. For constipation      . potassium chloride SA (K-DUR,KLOR-CON) 20 MEQ tablet Take 2 tablets (40 mEq total) by mouth 2 (two) times daily.  120 tablet  3   No current facility-administered medications for this visit.    Allergies:   No Known Allergies  Social History:  The patient  reports that he quit smoking about 32 years ago. His smoking  use included Cigarettes. He has a 25 pack-year smoking history. He has never used smokeless tobacco. He reports that he drinks alcohol. He reports that he does not use illicit drugs.   Family history:   Family History  Problem Relation Age of Onset  . Diabetes Mellitus II Mother   . Sudden death Mother   . Prostate cancer Father     ROS:  Hand and feet swelling, weight gain, dizziness  PHYSICAL EXAM: VS:  BP 120/70  Pulse 68  Ht 5' 9.5" (1.765 m)  Wt 218 lb 14.4 oz (99.292 kg)  BMI 31.87 kg/m2 Obese, well developed, in no acute distress HEENT: Pupils are equal round react to light accommodation extraocular movements are intact.  Neck: JVP flat,No cervical lymphadenopathy. Cardiac: Regular rate and rhythm without murmurs rubs or gallops. Lungs:  clear to auscultation bilaterally, no wheezing, rhonchi or rales Abd: soft, nontender, positive bowel sounds all quadrants, no hepatosplenomegaly Ext: no significant edema, 2+ radial and dorsalis pedis pulses. Skin: warm and dry Neuro:  Grossly normal  ASSESSMENT AND PLAN: Patient Active Problem List   Diagnosis Date Noted  . Obesity (BMI 30-39.9) 02/22/2013  . Non ischemic cardiomyopathy- EF 40-45% 2D Feb 2014 01/31/2013  . Acute on chronic renal insufficiency 01/31/2013  . Shortness of breath 01/26/2013  . Back pain, lumbosacral, since recent fall 01/26/2013  . Hypothyroid, recently diagnosed  01/26/2013  . Venous stasis ulcer of ankle 01/12/2013  .  Tachycardia-bradycardia syndrome, s/p [employer] PTVDP Feb 2014 05/29/2012  . Obstructive sleep apnea, C-pap intol 05/26/2012  . Chronic anticoagulation-Pradaxa 04/17/2012  . Normal coronary arteries at cath 2009 04/17/2012  . PAF (paroxysmal atrial fibrillation) 04/15/2012  . Generalized weakness 04/14/2012  . Constipation 04/14/2012  . PNA (pneumonia) 04/07/2012  . UTI (lower urinary tract infection) 04/02/2012  . Normocytic anemia 04/02/2012  . Nausea and vomiting 03/31/2012  . Healthcare-associated pneumonia 03/31/2012  . Chronic Diastolic Dysfunction -- exacerbated by Afib RVR; EF ~45% 03/31/2012  . LA thrombus on echo Feb 2014 12/27/2011  . CKD (chronic kidney disease) stage 3, GFR 30-59 ml/min 12/26/2011  . Bacteremia 12/20/2011  . Chronic diastolic CHF (congestive heart failure) 12/18/2011  . DM2 (diabetes mellitus, type 2) 12/18/2011  . Leukocytosis 12/18/2011  . Hypertension   . Prostate cancer    PLAN: 1.  Mr. Maye has successfully diuresis as an outpatient back to his dry weight. He is currently on Lasix 80 mg twice daily. I will like to continue this current dosing as he seems to be stable and actually looks the best that he has in some time. I recommend he continue to follow his weights and notify us of any weight gain over 3 pounds in 2-3 days. Plan to see him back in 3 months for close followup.  Chrystie Nose, MD, [facility] Attending Cardiologist [facility]

## 2013-04-22 ENCOUNTER — Telehealth: Payer: Self-pay | Admitting: Internal Medicine

## 2013-04-22 ENCOUNTER — Telehealth: Payer: Self-pay | Admitting: *Deleted

## 2013-04-22 DIAGNOSIS — I5032 Chronic diastolic (congestive) heart failure: Secondary | ICD-10-CM

## 2013-04-22 DIAGNOSIS — I509 Heart failure, unspecified: Secondary | ICD-10-CM

## 2013-04-22 NOTE — Telephone Encounter (Signed)
Pt's wife was calling in regards to [name] possibly going to cardiac rehab.   [place]

## 2013-04-22 NOTE — Telephone Encounter (Signed)
Wife informed that Latitude box needs to be hooked up to send transmission. Will call patient tomorrow with results.

## 2013-04-22 NOTE — Telephone Encounter (Signed)
Has question about the remote device check  Please call

## 2013-04-22 NOTE — Telephone Encounter (Signed)
Returned call and pt verified x 2 w/ pt's wife, June.  Stated she left two messages.  One was about the remote check they were supposed to do this morning, but she doesn't know how to do that.  RN asked if they have a transmitter, box that plugs into home phone) at home and wife stated she doesn't think so. Informed Tobin Chad, CMA with devices has been sent that message to f/u with what they should do.  Verbalized understanding  Wife also stated she called about pt possible starting cardiac rehab.  Stated they talked with the PA before and he mentioned it may be advantageous for pt to have cardiac rehab.  Wife wants to know if that is something that should be considered now that PT is complete.  Informed Dr. Debara Pickett will be notified for further instructions.  Verbalized understanding.  Message forwarded to Dr. Debara Pickett.

## 2013-04-22 NOTE — Telephone Encounter (Signed)
Message forwarded to S. Tye Savoy, CMA r/t device/monitor concerns

## 2013-04-23 ENCOUNTER — Telehealth: Payer: Self-pay | Admitting: Oncology

## 2013-04-23 ENCOUNTER — Ambulatory Visit (HOSPITAL_BASED_OUTPATIENT_CLINIC_OR_DEPARTMENT_OTHER): Payer: Medicare Other | Admitting: Oncology

## 2013-04-23 ENCOUNTER — Encounter: Payer: Self-pay | Admitting: Oncology

## 2013-04-23 ENCOUNTER — Other Ambulatory Visit (HOSPITAL_BASED_OUTPATIENT_CLINIC_OR_DEPARTMENT_OTHER): Payer: Medicare Other

## 2013-04-23 VITALS — BP 130/77 | HR 69 | Temp 97.3°F | Resp 18 | Ht 69.0 in | Wt 211.9 lb

## 2013-04-23 DIAGNOSIS — I509 Heart failure, unspecified: Secondary | ICD-10-CM

## 2013-04-23 DIAGNOSIS — E291 Testicular hypofunction: Secondary | ICD-10-CM

## 2013-04-23 DIAGNOSIS — C61 Malignant neoplasm of prostate: Secondary | ICD-10-CM

## 2013-04-23 DIAGNOSIS — R599 Enlarged lymph nodes, unspecified: Secondary | ICD-10-CM

## 2013-04-23 DIAGNOSIS — Z5111 Encounter for antineoplastic chemotherapy: Secondary | ICD-10-CM

## 2013-04-23 LAB — CBC WITH DIFFERENTIAL/PLATELET
BASO%: 0.1 % (ref 0.0–2.0)
Basophils Absolute: 0 10*3/uL (ref 0.0–0.1)
EOS%: 2.6 % (ref 0.0–7.0)
Eosinophils Absolute: 0.3 10*3/uL (ref 0.0–0.5)
HEMATOCRIT: 40.2 % (ref 38.4–49.9)
HGB: 13.2 g/dL (ref 13.0–17.1)
LYMPH%: 25.6 % (ref 14.0–49.0)
MCH: 32.4 pg (ref 27.2–33.4)
MCHC: 32.8 g/dL (ref 32.0–36.0)
MCV: 98.8 fL — AB (ref 79.3–98.0)
MONO#: 0.8 10*3/uL (ref 0.1–0.9)
MONO%: 6.9 % (ref 0.0–14.0)
NEUT#: 7.7 10*3/uL — ABNORMAL HIGH (ref 1.5–6.5)
NEUT%: 64.8 % (ref 39.0–75.0)
PLATELETS: 178 10*3/uL (ref 140–400)
RBC: 4.07 10*6/uL — ABNORMAL LOW (ref 4.20–5.82)
RDW: 14.4 % (ref 11.0–14.6)
WBC: 11.8 10*3/uL — ABNORMAL HIGH (ref 4.0–10.3)
lymph#: 3 10*3/uL (ref 0.9–3.3)

## 2013-04-23 LAB — COMPREHENSIVE METABOLIC PANEL (CC13)
ALK PHOS: 130 U/L (ref 40–150)
ALT: 20 U/L (ref 0–55)
AST: 23 U/L (ref 5–34)
Albumin: 3.4 g/dL — ABNORMAL LOW (ref 3.5–5.0)
Anion Gap: 13 mEq/L — ABNORMAL HIGH (ref 3–11)
BILIRUBIN TOTAL: 0.68 mg/dL (ref 0.20–1.20)
BUN: 35.6 mg/dL — ABNORMAL HIGH (ref 7.0–26.0)
CO2: 27 mEq/L (ref 22–29)
CREATININE: 2.1 mg/dL — AB (ref 0.7–1.3)
Calcium: 9.7 mg/dL (ref 8.4–10.4)
Chloride: 98 mEq/L (ref 98–109)
Glucose: 327 mg/dl — ABNORMAL HIGH (ref 70–140)
Potassium: 3.5 mEq/L (ref 3.5–5.1)
Sodium: 138 mEq/L (ref 136–145)
Total Protein: 7.7 g/dL (ref 6.4–8.3)

## 2013-04-23 LAB — PSA: PSA: 72.14 ng/mL — ABNORMAL HIGH (ref <=4.00)

## 2013-04-23 MED ORDER — LEUPROLIDE ACETATE (4 MONTH) 30 MG IM KIT
30.0000 mg | PACK | Freq: Once | INTRAMUSCULAR | Status: AC
  Administered 2013-04-23: 30 mg via INTRAMUSCULAR
  Filled 2013-04-23: qty 30

## 2013-04-23 NOTE — Telephone Encounter (Signed)
He is a candidate for Phase II cardiac rehab within 12 months of a hospitalization for Class IV CHF, which he had. He has to be able to exercise enough to participate in it.  I would authorize him to be evaluated for it, if he wants to do it.  -Dr. Debara Pickett

## 2013-04-23 NOTE — Telephone Encounter (Signed)
Please order Phase II cardiac rehab for Vincent [name].  Thanks.  -[place]

## 2013-04-23 NOTE — Telephone Encounter (Signed)
Returned call.  Left message w/ male answering for Vincent [name] to call back before 4pm.

## 2013-04-23 NOTE — Telephone Encounter (Signed)
Gave pt appt for lab and MD on May 2015 °

## 2013-04-23 NOTE — Progress Notes (Signed)
Hematology and Oncology Follow Up Visit  Vincent [name] [id number] [birth year] 78 y.o. 04/23/2013 11:59 AM Vincent [name], [name], Vincent Broach, MD   Principle Diagnosis: 78 year old gentleman with advanced prostate cancer he had a Gleason score 4+4 equals 8 and a PSA of 9 diagnosed in 2003. He had at T1c. tumor He has currently advanced localized disease with a pelvic mass and lymphadenopathy.  Prior Therapy:  He is status post external beam radiation with the brachytherapy completed in 2003. That was done at [facility]. He is status post androgen depravation and subsequently second line hormonal manipulation with ketoconazole and hydrocortisone. Patient was lost to followup and for a while have had followup at [facility] as well as [facility] and most recently established care at the [facility].  Current therapy: Androgen deprivation in the form of Lupron last time given in July 2014 40 PSA 118 and a testosterone level of 150.  Interim History:  Vincent [name] presents today for a followup visit. He is a pleasant gentleman with the above history. I saw him initially in September 2014 and his PSA at that time had declined to 37. Since his last visit, he has improved significantly. His congestive heart failure has improved with decrease in his overall fluid. He is no longer chair bound for the most part and reports pain in his lower extremity as well as occasional exertional dyspnea. He does not have any bone pain to report at this time.  Medications: I have reviewed the patient's current medications.  Current Outpatient Prescriptions  Medication Sig Dispense Refill  . acetaminophen (TYLENOL) 325 MG tablet Take 650 mg by mouth every 6 (six) hours as needed for pain.      Marland Kitchen allopurinol (ZYLOPRIM) 100 MG tablet Take 100 mg by mouth daily.      . Ascorbic Acid (VITAMIN C PO) Take by mouth daily.      . dabigatran (PRADAXA) 150 MG CAPS  capsule Take 1 capsule (150 mg total) by mouth every 12 (twelve) hours.  60 capsule  5  . digoxin (LANOXIN) 0.125 MG tablet Take 0.125 mg by mouth every other day.      . diltiazem (CARDIZEM CD) 240 MG 24 hr capsule Take 1 capsule (240 mg total) by mouth daily.  30 capsule  11  . doxycycline (VIBRA-TABS) 100 MG tablet Take 100 mg by mouth daily.      . furosemide (LASIX) 40 MG tablet Take 80 mg by mouth 2 (two) times daily.      . hydrALAZINE (APRESOLINE) 50 MG tablet Take 1 tablet (50 mg total) by mouth 2 (two) times daily.  60 tablet  5  . insulin aspart protamine- aspart (NOVOLOG MIX 70/30) (70-30) 100 UNIT/ML injection Inject 20 Units into the skin every evening. Sliding scale every morning      . isosorbide dinitrate (ISORDIL) 30 MG tablet Take 1 tablet (30 mg total) by mouth 2 (two) times daily.  60 tablet  5  . metolazone (ZAROXOLYN) 5 MG tablet Take 5 mg by mouth daily.      . metoprolol (LOPRESSOR) 100 MG tablet Take 50 mg by mouth 2 (two) times daily.      . polyethylene glycol (MIRALAX / GLYCOLAX) packet Take 17 g by mouth daily as needed. For constipation      . potassium chloride SA (K-DUR,KLOR-CON) 20 MEQ tablet Take 2 tablets (40 mEq total) by mouth 2 (two) times daily.  120 tablet  3  . docusate sodium (COLACE) 100 MG capsule Take 1 capsule (100 mg total) by mouth 2 (two) times daily as needed for constipation.  30 capsule  0   No current facility-administered medications for this visit.     Allergies: No Known Allergies  Past Medical History, Surgical history, Social history, and Family History were reviewed and updated.  Review of Systems:  Remaining ROS negative. Physical Exam: Blood pressure 130/77, pulse 69, temperature 97.3 F (36.3 C), temperature source Oral, resp. rate 18, height 5\' 9"  (1.753 m), weight 211 lb 14.4 oz (96.117 kg). ECOG:  General appearance: alert, cooperative and appears stated age Head: Normocephalic, without obvious abnormality,  atraumatic Neck: no adenopathy, no carotid bruit, no JVD, supple, symmetrical, trachea midline and thyroid not enlarged, symmetric, no tenderness/mass/nodules Lymph nodes: Cervical, supraclavicular, and axillary nodes normal. Heart:irregularly irregular rhythm Lung:chest clear, no wheezing, rales, normal symmetric air entry Abdomin: soft, non-tender, without masses or organomegaly GHW:EXHBZ noted.    Lab Results: Lab Results  Component Value Date   WBC 11.8* 04/23/2013   HGB 13.2 04/23/2013   HCT 40.2 04/23/2013   MCV 98.8* 04/23/2013   PLT 178 04/23/2013     Chemistry      Component Value Date/Time   NA 142 04/02/2013 0936   NA 143 01/22/2013 1310   K 3.8 04/02/2013 0936   K 3.9 01/22/2013 1310   CL 100 04/02/2013 0936   CO2 27 04/02/2013 0936   CO2 29 01/22/2013 1310   BUN 25* 04/02/2013 0936   BUN 34.3* 01/22/2013 1310   CREATININE 1.71* 04/02/2013 0936   CREATININE 1.76* 02/02/2013 0550   CREATININE 2.5* 01/22/2013 1310      Component Value Date/Time   CALCIUM 8.5 04/02/2013 0936   CALCIUM 9.2 01/22/2013 1310   ALKPHOS 111 01/31/2013 0100   ALKPHOS 127 01/22/2013 1310   AST 26 01/31/2013 0100   AST 44* 01/22/2013 1310   ALT 15 01/31/2013 0100   ALT 22 01/22/2013 1310   BILITOT 0.5 01/31/2013 0100   BILITOT 0.61 01/22/2013 1310      Results for Vincent [name] (MRN [id number]) as of 04/23/2013 11:49  Ref. Range 01/22/2013 13:10  PSA Latest Range: <=4.00 ng/mL 56.85 (H)      Impression and Plan:  78 year old gentleman with the following issues:  1. Prostate cancer diagnosed in 2003 presented with a Gleason score 4+4 equals 8 and a PSA of 9. He currently has advanced disease with with pelvic mass and adenopathy. He is currently on androgen deprivation and appears to be responding without any clear cut evidence of castration resistance. For the time being I will continue androgen depravation with Lupron a Should he develop a rise in his PSA despite castrate levels  testosterone and we can discuss other options of treatment.  2. Congestive heart failure he is following up with cardiology regarding that and might require another hospitalization for IV diuresis.  3. Followup will be in 4 months where he will get the next Lupron injection and every 4 months after that.    [facility], MD 1/9/201511:59 AM

## 2013-04-23 NOTE — Telephone Encounter (Signed)
Returned call and informed wife per instructions by MD.  Verbalized understanding and stated pt wants it.  Informed Dr. Josepha Pigg, RN will be notified to place necessary orders and they will be contacted from there.  Verbalized understanding and agreed w/ plan.  Message forwarded to Dr. Josepha Pigg, RN.

## 2013-04-26 NOTE — Telephone Encounter (Signed)
Called patient to inform that cardiac rehab had been ordered

## 2013-05-19 ENCOUNTER — Telehealth: Payer: Self-pay | Admitting: *Deleted

## 2013-05-19 NOTE — Telephone Encounter (Signed)
Faxed cardiac rehab orders 

## 2013-06-15 ENCOUNTER — Other Ambulatory Visit: Payer: Self-pay | Admitting: Internal Medicine

## 2013-06-16 ENCOUNTER — Telehealth: Payer: Self-pay | Admitting: *Deleted

## 2013-06-16 NOTE — Telephone Encounter (Signed)
Updated pt that Latitude home monitors are on back order. I have ordered him a unit directly through [employer]. His back order unit will be shipped directly to his residence. Pt expressed understanding.

## 2013-06-17 ENCOUNTER — Other Ambulatory Visit: Payer: Self-pay | Admitting: *Deleted

## 2013-06-17 MED ORDER — DIGOXIN 250 MCG PO TABS: ORAL_TABLET | ORAL | Status: AC

## 2013-06-17 NOTE — Telephone Encounter (Signed)
There was a question on the amount of digoxin the patient should be taking. Patients wife says the bottle says 250 mg 1 daily. Our last office note says 0.125 mg every other day. Wife informs me the patient has been taking as directed on the bottle. After consulting with Dr. Ellyn Hack, he instructed to send in 265mcg every other day and ask Dr. Debara Pickett what he wants the patient to take.  Pharmacy request given to [facility]  to consult with Dr. Debara Pickett. Prescription per Dr. Allison Quarry orders sent to [facility] pharmacy via e-scribe.

## 2013-07-07 ENCOUNTER — Other Ambulatory Visit (HOSPITAL_COMMUNITY): Payer: Self-pay | Admitting: Cardiology

## 2013-07-07 ENCOUNTER — Telehealth: Payer: Self-pay | Admitting: Oncology

## 2013-07-07 NOTE — Telephone Encounter (Signed)
cld pt  and left as message to adv per Dr Alen Blew to resch appt from 5/8 to 5/15. Adv pt that appt was 11:00 labs and 11:30 Dr appt.Will mail new appt sch to pt to adfdress on acct

## 2013-07-07 NOTE — Telephone Encounter (Signed)
cld pt  and left as message to adv per Dr Shadad to resch appt from 5/8 to 5/15. Adv pt that appt was 11:00 labs and 11:30 Dr appt.Will mail new appt sch to pt to adfdress on acct  °

## 2013-07-07 NOTE — Telephone Encounter (Signed)
Rx was sent to pharmacy electronically. 

## 2013-07-09 ENCOUNTER — Ambulatory Visit (INDEPENDENT_AMBULATORY_CARE_PROVIDER_SITE_OTHER): Payer: Medicare Other | Admitting: Internal Medicine

## 2013-07-09 ENCOUNTER — Encounter: Payer: Self-pay | Admitting: Internal Medicine

## 2013-07-09 VITALS — BP 120/58 | HR 74 | Ht 69.0 in | Wt 221.2 lb

## 2013-07-09 DIAGNOSIS — I5189 Other ill-defined heart diseases: Secondary | ICD-10-CM

## 2013-07-09 DIAGNOSIS — I5032 Chronic diastolic (congestive) heart failure: Secondary | ICD-10-CM

## 2013-07-09 DIAGNOSIS — I480 Paroxysmal atrial fibrillation: Secondary | ICD-10-CM

## 2013-07-09 DIAGNOSIS — I4891 Unspecified atrial fibrillation: Secondary | ICD-10-CM

## 2013-07-09 DIAGNOSIS — I422 Other hypertrophic cardiomyopathy: Secondary | ICD-10-CM

## 2013-07-09 DIAGNOSIS — I2510 Atherosclerotic heart disease of native coronary artery without angina pectoris: Secondary | ICD-10-CM

## 2013-07-09 DIAGNOSIS — I10 Essential (primary) hypertension: Secondary | ICD-10-CM

## 2013-07-09 DIAGNOSIS — I509 Heart failure, unspecified: Secondary | ICD-10-CM

## 2013-07-09 DIAGNOSIS — I513 Intracardiac thrombosis, not elsewhere classified: Secondary | ICD-10-CM

## 2013-07-09 NOTE — Patient Instructions (Signed)
No medication changes. Keep watching weight daily. Follow-up in 6 months.

## 2013-07-09 NOTE — Progress Notes (Signed)
Date:  07/09/2013   ID:  Vincent [name], DOB [birth year], MRN [id number]  PCP:  Vincent Spurling, MD  Primary Cardiologist:  Vincent [name]     History of Present Illness: Vincent [name] is a 78 y.o. male who has a history of mild nonischemic cardiomyopathy, EF of about 45%, and chronic combined systolic and diastolic heart failure with insulin-dependent diabetes. He also has a history of PAF, on Cardizem, Lopressor and Digoxin. He takes Pradaxa for stroke prophylaxis. His PPM was inserted in February 2013.   The patient was seen by Dr. Debara [name] in clinic on 01/26/13 for evaluation of dyspnea and weight gain. He endorsed worsening abdominal girth, lower extremity swelling, orthopnea, PND and other associated heart failure symptoms, despite being on high dose diuretic therapy, which included 80 mg of Lasix BID and 5 mg of Zaroxolyn daily. His weight in the office was 272 lb, which was well above his dry weight of 230 lbs. Outpatient labs showed an elevated BNP of 4216 and a SCr that had increased from 2.10 to 3.06 within 4 days. Dr. Debara [name] felt that his condition could not be managed appropriately in the outpatient setting. The patient was admitted directly from the office to [facility] for IV diuretics. Zaroxolyn was discontinued and low dose Milrinone was added as an adjunct. The patient had good diuresis. He diuresed nearly 10 L. His weight decreased from 272 to 235 lbs. His symptoms resolved and his renal function improved. Scr. decreased to 1.76. Milrinone was discontinued. He was restarted on Zaroxolyn and placed back on PO Lasix. His hydralazine and nitrate were both increased for further afterload reduction. His Hydralazine was increased to 50 mg BID and Isordil to 30 mg BID. All other home meds were continued.  He was seen by Vincent Fuller, PA-C in the office on 10/27 and his weight was continuing to decrease.  At that time, his lasix was decreased to 80mg  QAM and 40mg  QPM. He continues to take zaroxyln.  He is  home health aide reports that he's gained about 12 pounds over the past 4-5 days. Indeed, his weight is up to 228 today from 211 in November.  Does note some decreased urination. After some discussion it was clear that Vincent [name] has much difficulty seeing including bilateral cataracts and does not arrange his own medications.  His wife has been arranging his medications however she became acutely ill recently and was hospitalized. Apparently she was on dialysis and had a cardiac arrest. Fortunately she has survived, however he is not getting care at home. After going through his pillbox in the office today, it was clear that he had both a mix of 40 and 80 mg tablets. There were a number of half a size 40 and 80 mg tablets, so it is difficult to understand what dose he is taking on a daily basis. This is probably contributing to his weight gain.  At his last office visit I rearranged his medications so that he was on 40 mg twice daily of Lasix. He reported continued weight increase for 2 days after that visit therefore I increased his Lasix to 80 mg twice daily. Subsequently he had weight loss and has come back down to a resting weight of 218-220.   He continues to do well, today and living without much difficulty. His weight continues to hover around 220. He has had no worsening lower extremity edema. He is able a bili without worsening shortness of breath. He occasionally has  some indigestion. He is under some stress as his wife has had another cardiac arrest on dialysis, and he reports that she is "touch and go".  Wt Readings from Last 3 Encounters:  07/09/13 221 lb 3.2 oz (100.336 kg)  04/23/13 211 lb 14.4 oz (96.117 kg)  04/14/13 218 lb 14.4 oz (99.292 kg)     Past Medical History  Diagnosis Date  . Hypertension   . CHF (congestive heart failure)     EF 40-45% by Echo, combined Systolic & Diastolic  . Irregular heartbeat   . GERD (gastroesophageal reflux disease)   . Diastolic dysfunction,  left ventricle 12/18/2011  . Atrial fibrillation with rapid ventricular response, history of PAF was in SR in 08/2011 12/18/2011  . Fecal impaction 04/13/2012  . Left atrial thrombus 12/2011    By TEE 12/2011  . CKD (chronic kidney disease) stage 3, GFR 30-59 ml/min 12/26/2011  . Pneumonia 03/31/2012; 04/13/2012    Healthcare-associated pneumonia/notes 04/13/2012  . PAF (paroxysmal atrial fibrillation) 04/15/2012    TEE in 12/2011 with LAA thrombus - no DCCV   . Type II diabetes mellitus   . Arthritis     SHOULDERS  . Symptomatic bradycardia 05/29/2012  . Tachycardia-bradycardia syndrome 05/29/2012  . Nonischemic cardiomyopathy     h/o   . Hypothyroid, recently diagnosed  01/26/2013  . Pacemaker   . Orthopnea   . Gout   . Prostate cancer     Current Outpatient Prescriptions  Medication Sig Dispense Refill  . acetaminophen (TYLENOL) 325 MG tablet Take 650 mg by mouth every 6 (six) hours as needed for pain.      Marland Kitchen allopurinol (ZYLOPRIM) 100 MG tablet Take 100 mg by mouth daily.      . Ascorbic Acid (VITAMIN C PO) Take by mouth daily.      . dabigatran (PRADAXA) 150 MG CAPS capsule Take 1 capsule (150 mg total) by mouth every 12 (twelve) hours.  60 capsule  5  . digoxin (LANOXIN) 0.25 MG tablet Take 1 tablet every other day.  30 tablet  3  . diltiazem (CARDIZEM CD) 240 MG 24 hr capsule TAKE 1 CAPSULE (240 MG TOTAL) BY MOUTH DAILY.  30 capsule  9  . docusate sodium (COLACE) 100 MG capsule Take 1 capsule (100 mg total) by mouth 2 (two) times daily as needed for constipation.  30 capsule  0  . doxycycline (VIBRA-TABS) 100 MG tablet Take 100 mg by mouth daily.      . furosemide (LASIX) 40 MG tablet Take 80 mg by mouth 2 (two) times daily.      . hydrALAZINE (APRESOLINE) 50 MG tablet Take 1 tablet (50 mg total) by mouth 2 (two) times daily.  60 tablet  5  . insulin aspart protamine- aspart (NOVOLOG MIX 70/30) (70-30) 100 UNIT/ML injection Inject 20 Units into the skin every evening. Sliding scale  every morning      . isosorbide dinitrate (ISORDIL) 30 MG tablet Take 1 tablet (30 mg total) by mouth 2 (two) times daily.  60 tablet  5  . metolazone (ZAROXOLYN) 5 MG tablet Take 5 mg by mouth daily.      . metoprolol (LOPRESSOR) 100 MG tablet Take 50 mg by mouth 2 (two) times daily.      . polyethylene glycol (MIRALAX / GLYCOLAX) packet Take 17 g by mouth daily as needed. For constipation      . potassium chloride SA (K-DUR,KLOR-CON) 20 MEQ tablet Take 2 tablets (40 mEq total) by mouth  2 (two) times daily.  120 tablet  3   No current facility-administered medications for this visit.    Allergies:   No Known Allergies  Social History:  The patient  reports that he quit smoking about 33 years ago. His smoking use included Cigarettes. He has a 25 pack-year smoking history. He has never used smokeless tobacco. He reports that he drinks alcohol. He reports that he does not use illicit drugs.   Family history:   Family History  Problem Relation Age of Onset  . Diabetes Mellitus II Mother   . Sudden death Mother   . Prostate cancer Father     ROS:  Hand and feet swelling, weight gain, dizziness  PHYSICAL EXAM: VS:  BP 120/58  Pulse 74  Ht 5\' 9"  (1.753 m)  Wt 221 lb 3.2 oz (100.336 kg)  BMI 32.65 kg/m2 Obese, well developed, in no acute distress HEENT: Pupils are equal round react to light accommodation extraocular movements are intact.  Neck: JVP flat,No cervical lymphadenopathy. Cardiac: Regular rate and rhythm without murmurs rubs or gallops. Lungs:  clear to auscultation bilaterally, no wheezing, rhonchi or rales Abd: soft, nontender, positive bowel sounds all quadrants, no hepatosplenomegaly Ext: no significant edema, 2+ radial and dorsalis pedis pulses. Skin: warm and dry Neuro:  Grossly normal  ASSESSMENT AND PLAN: Patient Active Problem List   Diagnosis Date Noted  . Obesity (BMI 30-39.9) 02/22/2013  . Non ischemic cardiomyopathy- EF 40-45% 2D Feb 2014 01/31/2013  .  Acute on chronic renal insufficiency 01/31/2013  . Shortness of breath 01/26/2013  . Back pain, lumbosacral, since recent fall 01/26/2013  . Hypothyroid, recently diagnosed  01/26/2013  . Venous stasis ulcer of ankle 01/12/2013  . Tachycardia-bradycardia syndrome, s/p [employer] PTVDP Feb 2014 05/29/2012  . Obstructive sleep apnea, C-pap intol 05/26/2012  . Chronic anticoagulation-Pradaxa 04/17/2012  . Normal coronary arteries at cath 2009 04/17/2012  . PAF (paroxysmal atrial fibrillation) 04/15/2012  . Generalized weakness 04/14/2012  . Constipation 04/14/2012  . PNA (pneumonia) 04/07/2012  . UTI (lower urinary tract infection) 04/02/2012  . Normocytic anemia 04/02/2012  . Nausea and vomiting 03/31/2012  . Healthcare-associated pneumonia 03/31/2012  . Chronic Diastolic Dysfunction -- exacerbated by Afib RVR; EF ~45% 03/31/2012  . LA thrombus on echo Feb 2014 12/27/2011  . CKD (chronic kidney disease) stage 3, GFR 30-59 ml/min 12/26/2011  . Bacteremia 12/20/2011  . Chronic diastolic CHF (congestive heart failure) 12/18/2011  . DM2 (diabetes mellitus, type 2) 12/18/2011  . Leukocytosis 12/18/2011  . Hypertension   . Prostate cancer    PLAN: 1.  Mr. Hoefle continues to be clinically compensated on his current diuretic regimen. He is currently on Lasix 80 mg twice daily and metolazone 5 mg daily. I recommend he continue to follow his weights and notify us of any weight gain over 3 pounds in 2-3 days. Plan to see him back in 6 months for followup.  Pixie Casino, MD, [facility] Attending Cardiologist [place]

## 2013-07-13 ENCOUNTER — Ambulatory Visit: Payer: Medicare Other | Admitting: Internal Medicine

## 2013-07-22 ENCOUNTER — Other Ambulatory Visit: Payer: Self-pay | Admitting: Internal Medicine

## 2013-07-23 ENCOUNTER — Encounter: Payer: Self-pay | Admitting: *Deleted

## 2013-08-10 ENCOUNTER — Encounter: Payer: Self-pay | Admitting: Cardiovascular Disease

## 2013-08-19 ENCOUNTER — Ambulatory Visit (INDEPENDENT_AMBULATORY_CARE_PROVIDER_SITE_OTHER): Payer: Medicare Other | Admitting: *Deleted

## 2013-08-19 DIAGNOSIS — I495 Sick sinus syndrome: Secondary | ICD-10-CM

## 2013-08-19 DIAGNOSIS — I480 Paroxysmal atrial fibrillation: Secondary | ICD-10-CM

## 2013-08-19 DIAGNOSIS — I4891 Unspecified atrial fibrillation: Secondary | ICD-10-CM

## 2013-08-19 LAB — MDC_IDC_ENUM_SESS_TYPE_INCLINIC
Battery Remaining Longevity: 9.5
Brady Statistic RA Percent Paced: 5 %
Brady Statistic RV Percent Paced: 26 %
Implantable Pulse Generator Serial Number: 111946
Lead Channel Impedance Value: 514 Ohm
Lead Channel Impedance Value: 570 Ohm
Lead Channel Sensing Intrinsic Amplitude: 10.4 mV
Lead Channel Setting Pacing Amplitude: 2.5 V
Lead Channel Setting Pacing Pulse Width: 0.4 ms
Lead Channel Setting Sensing Sensitivity: 2.5 mV
MDC IDC MSMT LEADCHNL RA SENSING INTR AMPL: 4.3 mV
MDC IDC MSMT LEADCHNL RV PACING THRESHOLD AMPLITUDE: 1 V
MDC IDC MSMT LEADCHNL RV PACING THRESHOLD PULSEWIDTH: 0.4 ms
MDC IDC SET LEADCHNL RA PACING AMPLITUDE: 3.5 V

## 2013-08-19 LAB — PACEMAKER DEVICE OBSERVATION

## 2013-08-19 NOTE — Progress Notes (Signed)
Pacemaker check in clinic. Normal device function. Threshold, sensing, impedances consistent with previous measurements. Device programmed to maximize longevity. Permanent AF + Pradaxa 75. 720 "NSVT" episodes---max dur. 36 sec, Max V 167---AF w/RVR. 118 SVT episodes---max dur. 53mins 14 sec, Max V 188 + Cardizem 240, Lopressor 50, Dig 0.25. Device programmed at appropriate safety margins. Histogram distribution appropriate for patient activity level. Device programmed to optimize intrinsic conduction. Estimated longevity 9.5 years. Patient will follow up with [facility] on 8-4. Pt's new Latitude NXT communicator added to pt profile.

## 2013-08-20 ENCOUNTER — Other Ambulatory Visit: Payer: Medicare Other

## 2013-08-20 ENCOUNTER — Ambulatory Visit: Payer: Medicare Other | Admitting: Oncology

## 2013-08-23 ENCOUNTER — Encounter: Payer: Self-pay | Admitting: Cardiovascular Disease

## 2013-08-27 ENCOUNTER — Other Ambulatory Visit (HOSPITAL_BASED_OUTPATIENT_CLINIC_OR_DEPARTMENT_OTHER): Payer: Medicare Other

## 2013-08-27 ENCOUNTER — Encounter: Payer: Self-pay | Admitting: Oncology

## 2013-08-27 ENCOUNTER — Ambulatory Visit (HOSPITAL_BASED_OUTPATIENT_CLINIC_OR_DEPARTMENT_OTHER): Payer: Medicare Other | Admitting: Oncology

## 2013-08-27 ENCOUNTER — Telehealth: Payer: Self-pay | Admitting: Oncology

## 2013-08-27 VITALS — BP 173/52 | HR 86 | Temp 98.6°F | Resp 20 | Ht 69.0 in | Wt 214.9 lb

## 2013-08-27 DIAGNOSIS — Z5111 Encounter for antineoplastic chemotherapy: Secondary | ICD-10-CM

## 2013-08-27 DIAGNOSIS — I509 Heart failure, unspecified: Secondary | ICD-10-CM

## 2013-08-27 DIAGNOSIS — C61 Malignant neoplasm of prostate: Secondary | ICD-10-CM

## 2013-08-27 DIAGNOSIS — R972 Elevated prostate specific antigen [PSA]: Secondary | ICD-10-CM

## 2013-08-27 LAB — COMPREHENSIVE METABOLIC PANEL (CC13)
ALT: 13 U/L (ref 0–55)
AST: 19 U/L (ref 5–34)
Albumin: 3.1 g/dL — ABNORMAL LOW (ref 3.5–5.0)
Alkaline Phosphatase: 202 U/L — ABNORMAL HIGH (ref 40–150)
Anion Gap: 15 mEq/L — ABNORMAL HIGH (ref 3–11)
BILIRUBIN TOTAL: 0.61 mg/dL (ref 0.20–1.20)
BUN: 52.5 mg/dL — ABNORMAL HIGH (ref 7.0–26.0)
CO2: 22 mEq/L (ref 22–29)
Calcium: 9.5 mg/dL (ref 8.4–10.4)
Chloride: 102 mEq/L (ref 98–109)
Creatinine: 3 mg/dL (ref 0.7–1.3)
GLUCOSE: 258 mg/dL — AB (ref 70–140)
Potassium: 3.6 mEq/L (ref 3.5–5.1)
SODIUM: 139 meq/L (ref 136–145)
TOTAL PROTEIN: 7.4 g/dL (ref 6.4–8.3)

## 2013-08-27 LAB — CBC WITH DIFFERENTIAL/PLATELET
BASO%: 0.5 % (ref 0.0–2.0)
Basophils Absolute: 0.1 10*3/uL (ref 0.0–0.1)
EOS%: 16.2 % — ABNORMAL HIGH (ref 0.0–7.0)
Eosinophils Absolute: 2.1 10*3/uL — ABNORMAL HIGH (ref 0.0–0.5)
HCT: 35.6 % — ABNORMAL LOW (ref 38.4–49.9)
HEMOGLOBIN: 11.6 g/dL — AB (ref 13.0–17.1)
LYMPH#: 2.1 10*3/uL (ref 0.9–3.3)
LYMPH%: 15.9 % (ref 14.0–49.0)
MCH: 32.7 pg (ref 27.2–33.4)
MCHC: 32.6 g/dL (ref 32.0–36.0)
MCV: 100.2 fL — ABNORMAL HIGH (ref 79.3–98.0)
MONO#: 0.7 10*3/uL (ref 0.1–0.9)
MONO%: 5.2 % (ref 0.0–14.0)
NEUT#: 8.1 10*3/uL — ABNORMAL HIGH (ref 1.5–6.5)
NEUT%: 62.2 % (ref 39.0–75.0)
Platelets: 194 10*3/uL (ref 140–400)
RBC: 3.55 10*6/uL — ABNORMAL LOW (ref 4.20–5.82)
RDW: 15 % — ABNORMAL HIGH (ref 11.0–14.6)
WBC: 13.1 10*3/uL — ABNORMAL HIGH (ref 4.0–10.3)

## 2013-08-27 MED ORDER — LEUPROLIDE ACETATE (4 MONTH) 30 MG IM KIT
30.0000 mg | PACK | Freq: Once | INTRAMUSCULAR | Status: AC
  Administered 2013-08-27: 30 mg via INTRAMUSCULAR
  Filled 2013-08-27: qty 30

## 2013-08-27 NOTE — Progress Notes (Signed)
Hematology and Oncology Follow Up Visit  Vincent [name] [id number] [birth year] 78 y.o. 08/27/2013 11:21 AM Vincent [name], [name], Vincent Broach, MD   Principle Diagnosis: 78 year old gentleman with advanced prostate cancer he had a Gleason score 4+4 = 8 and a PSA of 9 diagnosed in 2003. He had at T1c. tumor He has currently advanced localized disease with a pelvic mass and lymphadenopathy.  Prior Therapy:  He is status post external beam radiation with the brachytherapy completed in 2003. That was done at [facility]. He is status post androgen depravation and subsequently second line hormonal manipulation with ketoconazole and hydrocortisone. Patient was lost to followup and for a while have had followup at [facility] as well as [facility] and most recently established care at the [facility].  Current therapy: Androgen deprivation in the form of Lupron last time given in July 2014 for a PSA 118 and a testosterone level of 150.  Interim History:  Vincent [name] presents today for a followup visit. He is a pleasant gentleman with the above history. Since his last visit, he has continued to improve clinically . His congestive heart failure has improved with decrease in his overall fluid. He is ambulatory with the help of a walker and relies on a caregiver for a lot of activities of daily living. He is not able to drive but his quality of life have been reasonable. He does not have any new bone pain to report at this time. He have had chronic back pain intermittently in the past. He does not report any chest pain or difficulty breathing. Has not reported any syncope or falls. Does not report any hematuria or dysuria. Does not report any constitutional symptoms or weight loss. He has not reported any skin rashes or lesions. Rest or view of system is unremarkable.  Medications: I have reviewed the patient's current medications.  Current  Outpatient Prescriptions  Medication Sig Dispense Refill  . allopurinol (ZYLOPRIM) 100 MG tablet Take 150 mg by mouth daily.       . dabigatran (PRADAXA) 150 MG CAPS capsule Take 1 capsule (150 mg total) by mouth every 12 (twelve) hours.  60 capsule  5  . digoxin (LANOXIN) 0.25 MG tablet Take 1 tablet every other day.  30 tablet  3  . diltiazem (CARDIZEM CD) 240 MG 24 hr capsule TAKE 1 CAPSULE (240 MG TOTAL) BY MOUTH DAILY.  30 capsule  9  . docusate sodium (COLACE) 100 MG capsule Take 1 capsule (100 mg total) by mouth 2 (two) times daily as needed for constipation.  30 capsule  0  . furosemide (LASIX) 40 MG tablet Take 40 mg by mouth 2 (two) times daily.       . hydrALAZINE (APRESOLINE) 50 MG tablet Take 1 tablet (50 mg total) by mouth 2 (two) times daily.  60 tablet  5  . insulin aspart protamine- aspart (NOVOLOG MIX 70/30) (70-30) 100 UNIT/ML injection Inject 20 Units into the skin every evening. Sliding scale every morning      . isosorbide dinitrate (ISORDIL) 30 MG tablet Take 1 tablet (30 mg total) by mouth 2 (two) times daily.  60 tablet  5  . metolazone (ZAROXOLYN) 5 MG tablet Take 5 mg by mouth daily.      . metoprolol (LOPRESSOR) 100 MG tablet Take 50 mg by mouth 2 (two) times daily.      . polyethylene glycol (MIRALAX / GLYCOLAX) packet Take 17 g  by mouth daily as needed. For constipation      . potassium chloride SA (K-DUR,KLOR-CON) 20 MEQ tablet Take 2 tablets (40 mEq total) by mouth 2 (two) times daily.  120 tablet  3  . PRADAXA 75 MG CAPS capsule TAKE ONE CAPSULE BY MOUTH TWICE A DAY  60 capsule  6   Current Facility-Administered Medications  Medication Dose Route Frequency Provider Last Rate Last Dose  . leuprolide (LUPRON) injection 30 mg  30 mg Intramuscular Once Wyatt Portela, MD            Physical Exam: Blood pressure 173/52, pulse 86, temperature 98.6 F (37 C), temperature source Oral, resp. rate 20, height 5\' 9"  (1.753 m), weight 214 lb 14.4 oz (97.478 kg), SpO2  100.00%. ECOG: 2 General appearance: Alert awake gentleman not in any distress. Head: Normocephalic, without obvious abnormality, atraumatic Neck: no adenopathy, no masses or Lymph nodes: Cervical, supraclavicular, and axillary nodes normal. Heart:irregularly irregular rhythm Lung:chest clear, no wheezing, rales, normal symmetric air entry Abdomin: soft, non-tender, without masses or organomegaly EXT: No edema noted.    Lab Results: Lab Results  Component Value Date   WBC 13.1* 08/27/2013   HGB 11.6* 08/27/2013   HCT 35.6* 08/27/2013   MCV 100.2* 08/27/2013   PLT 194 08/27/2013     Chemistry      Component Value Date/Time   NA 138 04/23/2013 1132   NA 142 04/02/2013 0936   K 3.5 04/23/2013 1132   K 3.8 04/02/2013 0936   CL 100 04/02/2013 0936   CO2 27 04/23/2013 1132   CO2 27 04/02/2013 0936   BUN 35.6* 04/23/2013 1132   BUN 25* 04/02/2013 0936   CREATININE 2.1* 04/23/2013 1132   CREATININE 1.71* 04/02/2013 0936   CREATININE 1.76* 02/02/2013 0550      Component Value Date/Time   CALCIUM 9.7 04/23/2013 1132   CALCIUM 8.5 04/02/2013 0936   ALKPHOS 130 04/23/2013 1132   ALKPHOS 111 01/31/2013 0100   AST 23 04/23/2013 1132   AST 26 01/31/2013 0100   ALT 20 04/23/2013 1132   ALT 15 01/31/2013 0100   BILITOT 0.68 04/23/2013 1132   BILITOT 0.5 01/31/2013 0100       Results for Vincent [name], Vincent [name] (MRN [id number]) as of 08/27/2013 11:18  Ref. Range 12/15/2012 10:49 01/22/2013 13:10 04/23/2013 11:32  PSA Latest Range: <=4.00 ng/mL 56.69 (H) 56.85 (H) 72.14 (H)      Impression and Plan:  78 year old gentleman with the following issues:  1. Prostate cancer diagnosed in 2003 presented with a Gleason score 4+4 = 8 and a PSA of 9. He currently has advanced disease with with pelvic mass and adenopathy. 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 He did have a mild elevation in his PSA to 72 on last visit. His PSA did drop initially from 120 down to  56. I plan to restage him before the next visit and recheck a testosterone level to make sure he is adequately castrate. If he is indeed developing castration resistant disease, we can certainly add a different agent such as Xtandi or Zytiga.  2. Congestive heart failure he is following up with cardiology it appears to be well compensated.  3. Followup will be in 4 months where he will get the next Lupron injection and every 4 months after that.     Wyatt Portela, MD 5/15/201511:21 AM

## 2013-08-27 NOTE — Telephone Encounter (Signed)
gave pt appt for lab and MD on September 2015 °

## 2013-08-28 LAB — PSA: PSA: 132.1 ng/mL — AB (ref <=4.00)

## 2013-08-30 ENCOUNTER — Ambulatory Visit: Payer: Self-pay | Admitting: Podiatry

## 2013-09-08 ENCOUNTER — Encounter: Payer: Self-pay | Admitting: Podiatry

## 2013-09-08 ENCOUNTER — Ambulatory Visit (INDEPENDENT_AMBULATORY_CARE_PROVIDER_SITE_OTHER): Payer: Medicare Other | Admitting: Podiatry

## 2013-09-08 VITALS — BP 135/67 | HR 88 | Resp 16

## 2013-09-08 DIAGNOSIS — B351 Tinea unguium: Secondary | ICD-10-CM

## 2013-09-08 NOTE — Patient Instructions (Signed)
Diabetes and Foot Care Diabetes may cause you to have problems because of poor blood supply (circulation) to your feet and legs. This may cause the skin on your feet to become thinner, break easier, and heal more slowly. Your skin may become dry, and the skin may peel and crack. You may also have nerve damage in your legs and feet causing decreased feeling in them. You may not notice minor injuries to your feet that could lead to infections or more serious problems. Taking care of your feet is one of the most important things you can do for yourself.  HOME CARE INSTRUCTIONS  Wear shoes at all times, even in the house. Do not go barefoot. Bare feet are easily injured.  Check your feet daily for blisters, cuts, and redness. If you cannot see the bottom of your feet, use a mirror or ask someone for help.  Wash your feet with warm water (do not use hot water) and mild soap. Then pat your feet and the areas between your toes until they are completely dry. Do not soak your feet as this can dry your skin.  Apply a moisturizing lotion or petroleum jelly (that does not contain alcohol and is unscented) to the skin on your feet and to dry, brittle toenails. Do not apply lotion between your toes.  Trim your toenails straight across. Do not dig under them or around the cuticle. File the edges of your nails with an emery board or nail file.  Do not cut corns or calluses or try to remove them with medicine.  Wear clean socks or stockings every day. Make sure they are not too tight. Do not wear knee-high stockings since they may decrease blood flow to your legs.  Wear shoes that fit properly and have enough cushioning. To break in new shoes, wear them for just a few hours a day. This prevents you from injuring your feet. Always look in your shoes before you put them on to be sure there are no objects inside.  Do not cross your legs. This may decrease the blood flow to your feet.  If you find a minor scrape,  cut, or break in the skin on your feet, keep it and the skin around it clean and dry. These areas may be cleansed with mild soap and water. Do not cleanse the area with peroxide, alcohol, or iodine.  When you remove an adhesive bandage, be sure not to damage the skin around it.  If you have a wound, look at it several times a day to make sure it is healing.  Do not use heating pads or hot water bottles. They may burn your skin. If you have lost feeling in your feet or legs, you may not know it is happening until it is too late.  Make sure your health care provider performs a complete foot exam at least annually or more often if you have foot problems. Report any cuts, sores, or bruises to your health care provider immediately. SEEK MEDICAL CARE IF:   You have an injury that is not healing.  You have cuts or breaks in the skin.  You have an ingrown nail.  You notice redness on your legs or feet.  You feel burning or tingling in your legs or feet.  You have pain or cramps in your legs and feet.  Your legs or feet are numb.  Your feet always feel cold. SEEK IMMEDIATE MEDICAL CARE IF:   There is increasing redness,   swelling, or pain in or around a wound.  There is a red line that goes up your leg.  Pus is coming from a wound.  You develop a fever or as directed by your health care provider.  You notice a bad smell coming from an ulcer or wound. Document Released: 03/29/2000 Document Revised: 12/02/2012 Document Reviewed: 09/08/2012 [employer] Patient Information 2014 [employer]  

## 2013-09-08 NOTE — Progress Notes (Signed)
Subjective:     Patient ID: Vincent [name], male   DOB: [birth year], 78 y.o.   MRN: [id number]  HPI patient presents stating I need to get my nails cut as I cannot cut them and they're thick and painful   Review of Systems     Objective:   Physical Exam Neurovascular status intact with thick painful nail bed 1-5 both feet    Assessment:     Mycotic nail infection with pain 1-5 both feet    Plan:     Debridement painful nailbeds 1-5 both feet no iatrogenic bleeding noted

## 2013-10-08 ENCOUNTER — Other Ambulatory Visit (HOSPITAL_COMMUNITY): Payer: Self-pay | Admitting: Internal Medicine

## 2013-10-08 NOTE — Telephone Encounter (Signed)
Rx refill sent to patient pharmacy   

## 2013-11-09 ENCOUNTER — Other Ambulatory Visit (HOSPITAL_COMMUNITY): Payer: Self-pay | Admitting: Cardiology

## 2013-11-09 NOTE — Telephone Encounter (Signed)
Rx was sent to pharmacy electronically. 

## 2013-11-16 ENCOUNTER — Encounter: Payer: Medicare Other | Admitting: Cardiovascular Disease

## 2013-11-29 ENCOUNTER — Emergency Department (HOSPITAL_COMMUNITY)
Admission: EM | Admit: 2013-11-29 | Discharge: 2013-11-30 | Disposition: A | Discharge status: Home / Self Care | Payer: Medicare Other | Source: Home / Self Care | Attending: Emergency Medicine | Admitting: Emergency Medicine

## 2013-11-29 ENCOUNTER — Emergency Department (HOSPITAL_COMMUNITY): Payer: Medicare Other

## 2013-11-29 ENCOUNTER — Encounter (HOSPITAL_COMMUNITY): Payer: Self-pay | Admitting: Emergency Medicine

## 2013-11-29 DIAGNOSIS — Z8546 Personal history of malignant neoplasm of prostate: Secondary | ICD-10-CM | POA: Insufficient documentation

## 2013-11-29 DIAGNOSIS — N183 Chronic kidney disease, stage 3 unspecified: Secondary | ICD-10-CM | POA: Insufficient documentation

## 2013-11-29 DIAGNOSIS — S40012A Contusion of left shoulder, initial encounter: Secondary | ICD-10-CM

## 2013-11-29 DIAGNOSIS — M109 Gout, unspecified: Secondary | ICD-10-CM | POA: Insufficient documentation

## 2013-11-29 DIAGNOSIS — E872 Acidosis, unspecified: Secondary | ICD-10-CM | POA: Diagnosis present

## 2013-11-29 DIAGNOSIS — C61 Malignant neoplasm of prostate: Secondary | ICD-10-CM | POA: Diagnosis present

## 2013-11-29 DIAGNOSIS — E119 Type 2 diabetes mellitus without complications: Secondary | ICD-10-CM | POA: Diagnosis present

## 2013-11-29 DIAGNOSIS — N12 Tubulo-interstitial nephritis, not specified as acute or chronic: Secondary | ICD-10-CM | POA: Diagnosis present

## 2013-11-29 DIAGNOSIS — I2789 Other specified pulmonary heart diseases: Secondary | ICD-10-CM | POA: Diagnosis present

## 2013-11-29 DIAGNOSIS — R296 Repeated falls: Secondary | ICD-10-CM | POA: Insufficient documentation

## 2013-11-29 DIAGNOSIS — G319 Degenerative disease of nervous system, unspecified: Secondary | ICD-10-CM | POA: Insufficient documentation

## 2013-11-29 DIAGNOSIS — A409 Streptococcal sepsis, unspecified: Secondary | ICD-10-CM | POA: Diagnosis not present

## 2013-11-29 DIAGNOSIS — E039 Hypothyroidism, unspecified: Secondary | ICD-10-CM

## 2013-11-29 DIAGNOSIS — I5040 Unspecified combined systolic (congestive) and diastolic (congestive) heart failure: Secondary | ICD-10-CM | POA: Insufficient documentation

## 2013-11-29 DIAGNOSIS — Z79899 Other long term (current) drug therapy: Secondary | ICD-10-CM | POA: Insufficient documentation

## 2013-11-29 DIAGNOSIS — Z794 Long term (current) use of insulin: Secondary | ICD-10-CM

## 2013-11-29 DIAGNOSIS — IMO0002 Reserved for concepts with insufficient information to code with codable children: Secondary | ICD-10-CM | POA: Insufficient documentation

## 2013-11-29 DIAGNOSIS — Z8701 Personal history of pneumonia (recurrent): Secondary | ICD-10-CM

## 2013-11-29 DIAGNOSIS — I129 Hypertensive chronic kidney disease with stage 1 through stage 4 chronic kidney disease, or unspecified chronic kidney disease: Secondary | ICD-10-CM | POA: Diagnosis present

## 2013-11-29 DIAGNOSIS — R627 Adult failure to thrive: Secondary | ICD-10-CM | POA: Diagnosis present

## 2013-11-29 DIAGNOSIS — S4980XA Other specified injuries of shoulder and upper arm, unspecified arm, initial encounter: Secondary | ICD-10-CM

## 2013-11-29 DIAGNOSIS — W19XXXA Unspecified fall, initial encounter: Secondary | ICD-10-CM

## 2013-11-29 DIAGNOSIS — I5189 Other ill-defined heart diseases: Secondary | ICD-10-CM | POA: Diagnosis present

## 2013-11-29 DIAGNOSIS — S40019A Contusion of unspecified shoulder, initial encounter: Secondary | ICD-10-CM

## 2013-11-29 DIAGNOSIS — D696 Thrombocytopenia, unspecified: Secondary | ICD-10-CM | POA: Diagnosis present

## 2013-11-29 DIAGNOSIS — A419 Sepsis, unspecified organism: Secondary | ICD-10-CM | POA: Diagnosis present

## 2013-11-29 DIAGNOSIS — I428 Other cardiomyopathies: Secondary | ICD-10-CM | POA: Diagnosis present

## 2013-11-29 DIAGNOSIS — Z9861 Coronary angioplasty status: Secondary | ICD-10-CM

## 2013-11-29 DIAGNOSIS — Z515 Encounter for palliative care: Secondary | ICD-10-CM

## 2013-11-29 DIAGNOSIS — Z66 Do not resuscitate: Secondary | ICD-10-CM | POA: Diagnosis not present

## 2013-11-29 DIAGNOSIS — E8809 Other disorders of plasma-protein metabolism, not elsewhere classified: Secondary | ICD-10-CM | POA: Diagnosis present

## 2013-11-29 DIAGNOSIS — Y9289 Other specified places as the place of occurrence of the external cause: Secondary | ICD-10-CM

## 2013-11-29 DIAGNOSIS — N184 Chronic kidney disease, stage 4 (severe): Secondary | ICD-10-CM | POA: Diagnosis present

## 2013-11-29 DIAGNOSIS — Z950 Presence of cardiac pacemaker: Secondary | ICD-10-CM

## 2013-11-29 DIAGNOSIS — N1330 Unspecified hydronephrosis: Secondary | ICD-10-CM | POA: Diagnosis present

## 2013-11-29 DIAGNOSIS — Y9389 Activity, other specified: Secondary | ICD-10-CM

## 2013-11-29 DIAGNOSIS — I4891 Unspecified atrial fibrillation: Secondary | ICD-10-CM | POA: Diagnosis present

## 2013-11-29 DIAGNOSIS — G4733 Obstructive sleep apnea (adult) (pediatric): Secondary | ICD-10-CM | POA: Diagnosis present

## 2013-11-29 DIAGNOSIS — Z8739 Personal history of other diseases of the musculoskeletal system and connective tissue: Secondary | ICD-10-CM

## 2013-11-29 DIAGNOSIS — Z7901 Long term (current) use of anticoagulants: Secondary | ICD-10-CM

## 2013-11-29 DIAGNOSIS — Z8719 Personal history of other diseases of the digestive system: Secondary | ICD-10-CM | POA: Insufficient documentation

## 2013-11-29 DIAGNOSIS — Z87891 Personal history of nicotine dependence: Secondary | ICD-10-CM

## 2013-11-29 DIAGNOSIS — Z833 Family history of diabetes mellitus: Secondary | ICD-10-CM

## 2013-11-29 DIAGNOSIS — I959 Hypotension, unspecified: Secondary | ICD-10-CM | POA: Diagnosis present

## 2013-11-29 DIAGNOSIS — R5383 Other fatigue: Secondary | ICD-10-CM | POA: Diagnosis not present

## 2013-11-29 DIAGNOSIS — I5042 Chronic combined systolic (congestive) and diastolic (congestive) heart failure: Secondary | ICD-10-CM | POA: Diagnosis present

## 2013-11-29 DIAGNOSIS — G92 Toxic encephalopathy: Secondary | ICD-10-CM | POA: Diagnosis present

## 2013-11-29 DIAGNOSIS — Z8042 Family history of malignant neoplasm of prostate: Secondary | ICD-10-CM

## 2013-11-29 DIAGNOSIS — D631 Anemia in chronic kidney disease: Secondary | ICD-10-CM | POA: Diagnosis present

## 2013-11-29 DIAGNOSIS — N039 Chronic nephritic syndrome with unspecified morphologic changes: Secondary | ICD-10-CM

## 2013-11-29 DIAGNOSIS — I509 Heart failure, unspecified: Secondary | ICD-10-CM | POA: Diagnosis present

## 2013-11-29 DIAGNOSIS — S46909A Unspecified injury of unspecified muscle, fascia and tendon at shoulder and upper arm level, unspecified arm, initial encounter: Secondary | ICD-10-CM | POA: Insufficient documentation

## 2013-11-29 DIAGNOSIS — N170 Acute kidney failure with tubular necrosis: Secondary | ICD-10-CM | POA: Diagnosis present

## 2013-11-29 DIAGNOSIS — R5381 Other malaise: Secondary | ICD-10-CM | POA: Diagnosis not present

## 2013-11-29 DIAGNOSIS — R319 Hematuria, unspecified: Secondary | ICD-10-CM | POA: Diagnosis present

## 2013-11-29 DIAGNOSIS — R6520 Severe sepsis without septic shock: Secondary | ICD-10-CM

## 2013-11-29 DIAGNOSIS — G929 Unspecified toxic encephalopathy: Secondary | ICD-10-CM | POA: Diagnosis present

## 2013-11-29 DIAGNOSIS — Z9181 History of falling: Secondary | ICD-10-CM

## 2013-11-29 DIAGNOSIS — E43 Unspecified severe protein-calorie malnutrition: Secondary | ICD-10-CM | POA: Diagnosis present

## 2013-11-29 LAB — CBC WITH DIFFERENTIAL/PLATELET
BASOS ABS: 0 10*3/uL (ref 0.0–0.1)
Basophils Relative: 0 % (ref 0–1)
EOS PCT: 0 % (ref 0–5)
Eosinophils Absolute: 0 10*3/uL (ref 0.0–0.7)
HCT: 30.9 % — ABNORMAL LOW (ref 39.0–52.0)
Hemoglobin: 10.2 g/dL — ABNORMAL LOW (ref 13.0–17.0)
LYMPHS PCT: 3 % — AB (ref 12–46)
Lymphs Abs: 0.3 10*3/uL — ABNORMAL LOW (ref 0.7–4.0)
MCH: 31.4 pg (ref 26.0–34.0)
MCHC: 33 g/dL (ref 30.0–36.0)
MCV: 95.1 fL (ref 78.0–100.0)
Monocytes Absolute: 0.3 10*3/uL (ref 0.1–1.0)
Monocytes Relative: 2 % — ABNORMAL LOW (ref 3–12)
Neutro Abs: 10.8 10*3/uL — ABNORMAL HIGH (ref 1.7–7.7)
Neutrophils Relative %: 95 % — ABNORMAL HIGH (ref 43–77)
PLATELETS: 144 10*3/uL — AB (ref 150–400)
RBC: 3.25 MIL/uL — ABNORMAL LOW (ref 4.22–5.81)
RDW: 15 % (ref 11.5–15.5)
WBC: 11.4 10*3/uL — AB (ref 4.0–10.5)

## 2013-11-29 LAB — COMPREHENSIVE METABOLIC PANEL
ALT: 11 U/L (ref 0–53)
AST: 30 U/L (ref 0–37)
Albumin: 2.7 g/dL — ABNORMAL LOW (ref 3.5–5.2)
Alkaline Phosphatase: 349 U/L — ABNORMAL HIGH (ref 39–117)
Anion gap: 20 — ABNORMAL HIGH (ref 5–15)
BUN: 98 mg/dL — ABNORMAL HIGH (ref 6–23)
CALCIUM: 9.3 mg/dL (ref 8.4–10.5)
CO2: 21 mEq/L (ref 19–32)
Chloride: 96 mEq/L (ref 96–112)
Creatinine, Ser: 3.44 mg/dL — ABNORMAL HIGH (ref 0.50–1.35)
GFR calc Af Amer: 18 mL/min — ABNORMAL LOW (ref >90)
GFR calc non Af Amer: 16 mL/min — ABNORMAL LOW (ref >90)
Glucose, Bld: 142 mg/dL — ABNORMAL HIGH (ref 70–99)
Potassium: 3.6 mEq/L — ABNORMAL LOW (ref 3.7–5.3)
Sodium: 137 mEq/L (ref 137–147)
Total Bilirubin: 0.8 mg/dL (ref 0.3–1.2)
Total Protein: 7.7 g/dL (ref 6.0–8.3)

## 2013-11-29 LAB — URINALYSIS, ROUTINE W REFLEX MICROSCOPIC
Bilirubin Urine: NEGATIVE
Glucose, UA: NEGATIVE mg/dL
Ketones, ur: NEGATIVE mg/dL
Nitrite: NEGATIVE
Protein, ur: 30 mg/dL — AB
Specific Gravity, Urine: 1.016 (ref 1.005–1.030)
Urobilinogen, UA: 1 mg/dL (ref 0.0–1.0)
pH: 5.5 (ref 5.0–8.0)

## 2013-11-29 LAB — URINE MICROSCOPIC-ADD ON

## 2013-11-29 MED ORDER — DILTIAZEM HCL ER COATED BEADS 240 MG PO CP24
240.0000 mg | ORAL_CAPSULE | ORAL | Status: AC
  Administered 2013-11-29: 240 mg via ORAL
  Filled 2013-11-29: qty 1

## 2013-11-29 MED ORDER — OXYCODONE-ACETAMINOPHEN 5-325 MG PO TABS
1.0000 | ORAL_TABLET | Freq: Once | ORAL | Status: AC
Start: 2013-11-29 — End: 2013-11-29
  Administered 2013-11-29: 1 via ORAL
  Filled 2013-11-29: qty 1

## 2013-11-29 MED ORDER — METOPROLOL TARTRATE 25 MG PO TABS
50.0000 mg | ORAL_TABLET | Freq: Once | ORAL | Status: AC
  Administered 2013-11-29: 50 mg via ORAL
  Filled 2013-11-29: qty 2

## 2013-11-29 MED ORDER — METOPROLOL TARTRATE 1 MG/ML IV SOLN
5.0000 mg | Freq: Once | INTRAVENOUS | Status: AC
  Administered 2013-11-30: 5 mg via INTRAVENOUS
  Filled 2013-11-29: qty 5

## 2013-11-29 NOTE — ED Provider Notes (Signed)
CSN: [id number]     Arrival date & time 11/29/13  6073 History   First MD Initiated Contact with Patient 11/29/13 1834     Chief Complaint  Patient presents with  . Fall     (Consider location/radiation/quality/duration/timing/severity/associated sxs/prior Treatment) Patient is a 78 y.o. male presenting with fall. The history is provided by the patient.  Fall This is a new problem. Episode onset: 3 days ago. Episode frequency: once. The problem has been resolved. Pertinent negatives include no chest pain, no abdominal pain, no headaches and no shortness of breath. Nothing aggravates the symptoms. Nothing relieves the symptoms. He has tried acetaminophen for the symptoms. The treatment provided mild relief.    Past Medical History  Diagnosis Date  . Hypertension   . CHF (congestive heart failure)     EF 40-45% by Echo, combined Systolic & Diastolic  . Irregular heartbeat   . GERD (gastroesophageal reflux disease)   . Diastolic dysfunction, left ventricle 12/18/2011  . Atrial fibrillation with rapid ventricular response, history of PAF was in SR in 08/2011 12/18/2011  . Fecal impaction 04/13/2012  . Left atrial thrombus 12/2011    By TEE 12/2011  . CKD (chronic kidney disease) stage 3, GFR 30-59 ml/min 12/26/2011  . Pneumonia 03/31/2012; 04/13/2012    Healthcare-associated pneumonia/notes 04/13/2012  . PAF (paroxysmal atrial fibrillation) 04/15/2012    TEE in 12/2011 with LAA thrombus - no DCCV   . Type II diabetes mellitus   . Arthritis     SHOULDERS  . Symptomatic bradycardia 05/29/2012  . Tachycardia-bradycardia syndrome 05/29/2012  . Nonischemic cardiomyopathy     h/o   . Hypothyroid, recently diagnosed  01/26/2013  . Pacemaker   . Orthopnea   . Gout   . Prostate cancer    Past Surgical History  Procedure Laterality Date  . Prostate surgery    . Tee without cardioversion  12/27/2011    Procedure: TRANSESOPHAGEAL ECHOCARDIOGRAM (TEE);  Surgeon: Pixie Casino, MD;  Location: [facility]  OR;  Service: Cardiovascular;  Laterality: N/A;  MD request Main OR - EF 40-45%, mild conc LVH, systolic function mild-mod reduced; LA mod-severely dialted with uniobular appendage  . Cardioversion  12/27/2011    Procedure: CARDIOVERSION;  Surgeon: Pixie Casino, MD;  Location: [facility] OR;  Service: Cardiovascular;  Laterality: N/A;  . Coronary angioplasty with stent placement    . Cardioversion N/A 05/27/2012    Procedure: CARDIOVERSION;  Surgeon: Pixie Casino, MD;  Location: [facility];  Service: Cardiovascular;  Laterality: N/A;  . Pacemaker insertion  05/2011    [place], Model [id number], Serial [id number]  . Cardiac catheterization  05/28/2007    patent coronaries, EF 50%, mod pulm arterial htn (Dr. Melvern Banker)  . Insert / replace / remove pacemaker     Family History  Problem Relation Age of Onset  . Diabetes Mellitus II Mother   . Sudden death Mother   . Prostate cancer Father    History  Substance Use Topics  . Smoking status: Former Smoker -- 1.00 packs/day for 25 years    Types: Cigarettes    Quit date: 05/26/1980  . Smokeless tobacco: Never Used  . Alcohol Use: Yes     Comment: 01/26/2013 "cocktail couple times/year"    Review of Systems  Constitutional: Negative for fever.  HENT: Negative for drooling and rhinorrhea.   Eyes: Negative for pain.  Respiratory: Negative for cough and shortness of breath.   Cardiovascular: Negative for chest pain and leg swelling.  Gastrointestinal:  Negative for nausea, vomiting, abdominal pain and diarrhea.  Genitourinary: Negative for dysuria and hematuria.  Musculoskeletal: Negative for gait problem and neck pain.  Skin: Negative for color change.  Neurological: Negative for numbness and headaches.       Fall  Hematological: Negative for adenopathy.  Psychiatric/Behavioral: Negative for behavioral problems.  All other systems reviewed and are negative.     Allergies  Review of patient's allergies indicates no known  allergies.  Home Medications   Prior to Admission medications   Medication Sig Start Date End Date Taking? Authorizing Provider  allopurinol (ZYLOPRIM) 100 MG tablet Take 150 mg by mouth daily.     Historical Provider, MD  dabigatran (PRADAXA) 150 MG CAPS capsule Take 1 capsule (150 mg total) by mouth every 12 (twelve) hours. 02/02/13   Brittainy Simmons, PA-C  digoxin (LANOXIN) 0.25 MG tablet Take 1 tablet every other day. 06/17/13   Leonie Man, MD  diltiazem (CARDIZEM CD) 240 MG 24 hr capsule TAKE 1 CAPSULE (240 MG TOTAL) BY MOUTH DAILY. 07/07/13   Pixie Casino, MD  docusate sodium (COLACE) 100 MG capsule Take 1 capsule (100 mg total) by mouth 2 (two) times daily as needed for constipation. 05/29/12   Cecilie Kicks, NP  furosemide (LASIX) 40 MG tablet Take 40 mg by mouth 2 (two) times daily.  03/30/13   Pixie Casino, MD  hydrALAZINE (APRESOLINE) 50 MG tablet TAKE 1 TABLET (50 MG TOTAL) BY MOUTH 2 (TWO) TIMES DAILY. 11/09/13   Pixie Casino, MD  insulin aspart protamine- aspart (NOVOLOG MIX 70/30) (70-30) 100 UNIT/ML injection Inject 20 Units into the skin every evening. Sliding scale every morning    Historical Provider, MD  isosorbide dinitrate (ISORDIL) 30 MG tablet Take 1 tablet (30 mg total) by mouth 2 (two) times daily. 02/02/13   Brittainy Simmons, PA-C  levothyroxine (SYNTHROID, LEVOTHROID) 88 MCG tablet Take 88 mcg by mouth daily before breakfast.    Historical Provider, MD  metolazone (ZAROXOLYN) 5 MG tablet Take 5 mg by mouth daily.    Historical Provider, MD  metoprolol (LOPRESSOR) 100 MG tablet Take 50 mg by mouth 2 (two) times daily.    Historical Provider, MD  metoprolol (LOPRESSOR) 100 MG tablet TAKE 1/2 TABLET BY MOUTH TWICE DAILY 10/08/13   Pixie Casino, MD  polyethylene glycol (MIRALAX / GLYCOLAX) packet Take 17 g by mouth daily as needed. For constipation    Historical Provider, MD  potassium chloride SA (K-DUR,KLOR-CON) 20 MEQ tablet Take 2 tablets (40 mEq  total) by mouth 2 (two) times daily. 02/08/13   Pixie Casino, MD   BP 143/53  Pulse 122  Temp(Src) 98.2 F (36.8 C) (Oral)  Resp 12  Ht 5\' 9"  (1.753 m)  Wt 217 lb (98.431 kg)  BMI 32.03 kg/m2  SpO2 99% Physical Exam  Nursing note and vitals reviewed. Constitutional: He is oriented to person, place, and time. He appears well-developed and well-nourished.  HENT:  Head: Normocephalic and atraumatic.  Right Ear: External ear normal.  Left Ear: External ear normal.  Nose: Nose normal.  Mouth/Throat: Oropharynx is clear and moist. No oropharyngeal exudate.  Eyes: Conjunctivae and EOM are normal. Pupils are equal, round, and reactive to light.  Neck: Normal range of motion. Neck supple.  Mid cervical and upper thoracic spinal tenderness to palpation. No other vertebral tenderness to palpation noted.  Cardiovascular: Normal rate, regular rhythm, normal heart sounds and intact distal pulses.  Exam reveals no gallop and no  friction rub.   No murmur heard. Pulmonary/Chest: Effort normal and breath sounds normal. No respiratory distress. He has no wheezes.  Abdominal: Soft. Bowel sounds are normal. He exhibits no distension. There is no tenderness. There is no rebound and no guarding.  Musculoskeletal: Normal range of motion. He exhibits tenderness. He exhibits no edema.  Mild tenderness to palpation of the left clavicle and anterior shoulder.  Normal range of motion of the hips bilaterally without pain.  Neurological: He is alert and oriented to person, place, and time.  Skin: Skin is warm and dry.  Psychiatric: He has a normal mood and affect. His behavior is normal.    ED Course  Procedures (including critical care time) Labs Review Labs Reviewed  CBC WITH DIFFERENTIAL - Abnormal; Notable for the following:    WBC 11.4 (*)    RBC 3.25 (*)    Hemoglobin 10.2 (*)    HCT 30.9 (*)    Platelets 144 (*)    Neutrophils Relative % 95 (*)    Neutro Abs 10.8 (*)    Lymphocytes  Relative 3 (*)    Lymphs Abs 0.3 (*)    Monocytes Relative 2 (*)    All other components within normal limits  COMPREHENSIVE METABOLIC PANEL - Abnormal; Notable for the following:    Potassium 3.6 (*)    Glucose, Bld 142 (*)    BUN 98 (*)    Creatinine, Ser 3.44 (*)    Albumin 2.7 (*)    Alkaline Phosphatase 349 (*)    GFR calc non Af Amer 16 (*)    GFR calc Af Amer 18 (*)    Anion gap 20 (*)    All other components within normal limits  URINALYSIS, ROUTINE W REFLEX MICROSCOPIC - Abnormal; Notable for the following:    APPearance CLOUDY (*)    Hgb urine dipstick LARGE (*)    Protein, ur 30 (*)    Leukocytes, UA TRACE (*)    All other components within normal limits  URINE MICROSCOPIC-ADD ON    Imaging Review Dg Chest 1 View  11/29/2013   CLINICAL DATA:  Left anterior superior chest pain. History of diabetes, hypertension, CHF and cancer. History of pacemaker placement.  EXAM: CHEST - 1 VIEW  COMPARISON:  01/26/2013; 05/29/2012 ; 04/13/2012  FINDINGS: Grossly unchanged cardiac silhouette and mediastinal contours with atherosclerotic plaque within the thoracic aorta. Stable position of support apparatus. No pneumothorax. Unchanged granulomas within the peripheral aspect of the right upper and mid lung. There is grossly unchanged mild diffuse slightly nodular thickening of the pulmonary interstitium. Overall improved aeration of the lungs. No new focal airspace opacities. No supine evidence of pneumothorax or pleural effusion. No evidence of edema. No acute osseus abnormalities.  IMPRESSION: No definite acute cardiopulmonary disease on this AP supine examination. Further evaluation with a PA and lateral chest radiograph may be obtained as clinically indicated.   Electronically Signed   By: Sandi Mariscal M.D.   On: 11/29/2013 20:55   Dg Thoracic Spine 2 View  11/29/2013   CLINICAL DATA:  Upper back pain between shoulder blades approximately 3 days following fall.  EXAM: THORACIC SPINE - 2 VIEW   COMPARISON:  None.  FINDINGS: Evaluation of the superior aspect of the thoracic spine as well as the cervical thoracic junction is degraded secondary to overlying osseous and soft tissue structures.  Normal alignment of the thoracic spine. No definite anterolisthesis or retrolisthesis.  Thoracic vertebral body heights are preserved.  Stigmata of  DISH within in the mid in caudal aspects of the thoracic spine. Intervertebral disc spaces appear preserved.  Limited visualization adjacent thorax demonstrates atherosclerosis within the aortic arch. A calcified granuloma seen overlying the right upper lung. Pacemaker lead tips overlie the right atrium and ventricle.  IMPRESSION: 1. No acute findings. 2. Stigmata of DISH within the mid and caudal aspects of the thoracic spine.   Electronically Signed   By: Sandi Mariscal M.D.   On: 11/29/2013 20:57   Dg Pelvis 1-2 Views  11/29/2013   CLINICAL DATA:  Fall.  EXAM: PELVIS - 1-2 VIEW  COMPARISON:  01/01/2012.  FINDINGS: Mild RIGHT hip osteoarthritis. Scattered calcified granulomas associated with injection. Surgical clips over the inferior anatomic pelvis. These are likely for prostatectomy. Hip joint spaces appear symmetric. There is no fracture. The obturator rings appear intact. Sacral arcades are grossly normal.  IMPRESSION: Mild RIGHT hip osteoarthritis.  No acute abnormality.   Electronically Signed   By: Dereck Ligas M.D.   On: 11/29/2013 20:56   Ct Head Wo Contrast  11/29/2013   CLINICAL DATA:  [place].  Hit head.  EXAM: CT HEAD WITHOUT CONTRAST  CT CERVICAL SPINE WITHOUT CONTRAST  TECHNIQUE: Multidetector CT imaging of the head and cervical spine was performed following the standard protocol without intravenous contrast. Multiplanar CT image reconstructions of the cervical spine were also generated.  COMPARISON:  06/26/2012  FINDINGS: CT HEAD FINDINGS  Stable age related cerebral atrophy, ventriculomegaly and periventricular white matter disease. No extra-axial  fluid collections are identified. No CT findings for acute hemispheric infarction or intracranial hemorrhage. No mass lesions. The brainstem and cerebellum are normal.  No acute bony findings. No skull fracture. The paranasal sinuses and mastoid air cells are clear. The globes are intact.  CT CERVICAL SPINE FINDINGS  Moderate degenerative cervical spondylosis with multilevel disc disease and facet disease. Multilevel rim calcified disc protrusions are noted. The spinal canal is generous and there is no significant spinal stenosis. There is mild to moderate multilevel foraminal narrowing due to uncinate spurring and facet disease. No acute fracture. No abnormal prevertebral soft tissue swelling. The skullbase C1 and C1-2 articulations are maintained.  IMPRESSION: 1. Advanced cerebral atrophy, ventriculomegaly and periventricular white matter disease, unchanged since prior examination. 2. No acute intracranial findings or skull fracture 3. Advanced degenerative cervical spondylosis but no acute cervical spine fracture.   Electronically Signed   By: Kalman Jewels M.D.   On: 11/29/2013 19:56   Ct Cervical Spine Wo Contrast  11/29/2013   CLINICAL DATA:  [place].  Hit head.  EXAM: CT HEAD WITHOUT CONTRAST  CT CERVICAL SPINE WITHOUT CONTRAST  TECHNIQUE: Multidetector CT imaging of the head and cervical spine was performed following the standard protocol without intravenous contrast. Multiplanar CT image reconstructions of the cervical spine were also generated.  COMPARISON:  06/26/2012  FINDINGS: CT HEAD FINDINGS  Stable age related cerebral atrophy, ventriculomegaly and periventricular white matter disease. No extra-axial fluid collections are identified. No CT findings for acute hemispheric infarction or intracranial hemorrhage. No mass lesions. The brainstem and cerebellum are normal.  No acute bony findings. No skull fracture. The paranasal sinuses and mastoid air cells are clear. The globes are intact.  CT CERVICAL  SPINE FINDINGS  Moderate degenerative cervical spondylosis with multilevel disc disease and facet disease. Multilevel rim calcified disc protrusions are noted. The spinal canal is generous and there is no significant spinal stenosis. There is mild to moderate multilevel foraminal narrowing due to uncinate spurring and  facet disease. No acute fracture. No abnormal prevertebral soft tissue swelling. The skullbase C1 and C1-2 articulations are maintained.  IMPRESSION: 1. Advanced cerebral atrophy, ventriculomegaly and periventricular white matter disease, unchanged since prior examination. 2. No acute intracranial findings or skull fracture 3. Advanced degenerative cervical spondylosis but no acute cervical spine fracture.   Electronically Signed   By: Kalman Jewels M.D.   On: 11/29/2013 19:56   Dg Shoulder Left  11/29/2013   CLINICAL DATA:  Anterior shoulder tenderness to palpation after fall  EXAM: LEFT SHOULDER - 2+ VIEW  COMPARISON:  None.  FINDINGS: There is no evidence of fracture or dislocation. No significant glenohumeral or acromioclavicular degeneration for age. No evidence of injury to the left upper chest. Dual-chamber pacer battery pack present on the left.  IMPRESSION: No acute osseous findings.   Electronically Signed   By: Jorje Guild M.D.   On: 11/29/2013 20:56     EKG Interpretation   Date/Time:  Monday November 29 2013 21:16:09 EDT Ventricular Rate:  149 PR Interval:    QRS Duration: 116 QT Interval:  314 QTC Calculation: 494 R Axis:   -63 Text Interpretation:  Atrial fibrillation with rapid V-rate Left anterior  fascicular block Nonspecific T abnormalities, lateral leads now in afib w/  rvr Confirmed by Parrish Daddario  MD, Maanav Kassabian ([id number]) on 11/29/2013 10:35:35 PM      MDM   Final diagnoses:  Fall, initial encounter  Shoulder contusion, left, initial encounter  Atrial fibrillation with RVR    7:07 PM 78 y.o. male w hx of HTN, CHF, afib on pradaxa and diltiazem, CKD who  presents after a fall which occurred 3 days ago. 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 He denies loss of consciousness. He has had neck pain and left shoulder pain since that time. He is afebrile and tachycardic here. He has a history of atrial fibrillation and states that he did not take his diltiazem this morning. Will get screening labs, imaging, and will give his oral diltiazem.  Pt's remains in afib w/ RVR. Gave his evening dose of 50mg  metoprolol.   Case discussed w/ on call cards who recommends 5mg  metoprolol IV to help w/ HR.   Case discussed w/ hospitalist who also spoke w/ cards. Would like to try to avoid admission for afib w/ RVR as the HR is likely driven by pain although the pt would not like anything else for pain, current pain rating is 4/10. He also did not take his meds appropriately today and missed his diltiazem (which i gave here), morning dose of metoprolol (I gave evening dose), and digoxin.   12:19 AM: IV metroprolol helped w/ HR. Pt continues to appear well. Family comfortable taking him home. Imaging non-contrib. Known CKD and Cr proximal to previous values although trending up over the last year.  I have discussed the diagnosis/risks/treatment options with the patient and family and believe the pt to be eligible for discharge home to follow-up with his pcp as needed. We also discussed returning to the ED immediately if new or worsening sx occur. We discussed the sx which are most concerning (e.g., worsening pain, fever, recurrent falls) that necessitate immediate return. Medications administered to the patient during their visit and any new prescriptions provided to the patient are listed below.  Medications given during this visit Medications  oxyCODONE-acetaminophen (PERCOCET/ROXICET) 5-325 MG per tablet 1 tablet (1  tablet Oral Given 11/29/13 2111)  diltiazem (CARDIZEM CD) 24 hr capsule 240  mg (240 mg Oral Given 11/29/13 2111)  metoprolol tartrate (LOPRESSOR) tablet 50 mg (50 mg Oral Given 11/29/13 2240)  metoprolol (LOPRESSOR) injection 5 mg (5 mg Intravenous Given 11/30/13 0013)    Discharge Medication List as of 11/30/2013 12:20 AM    START taking these medications   Details  traMADol (ULTRAM) 50 MG tablet Take 1 tablet (50 mg total) by mouth every 6 (six) hours as needed., Starting 11/30/2013, Until Discontinued, Print         Pamella Pert, MD 11/30/13 1326

## 2013-11-29 NOTE — ED Notes (Signed)
Patient returned from CT

## 2013-11-29 NOTE — ED Notes (Signed)
Patient returned from X-ray 

## 2013-11-29 NOTE — ED Notes (Signed)
MD Aline Brochure made aware of the patient's BP. MD stated, "You can continue with the Lopressor. It is a small dose."

## 2013-11-29 NOTE — ED Notes (Signed)
Per EMS, Patient was chasing children when he slipped on water and fell on tile in the house. Patient's wife helped him up with no symptoms at that point. Patient started to have pain Saturday and started to become worse since. Patient reports neck pain, left arm, and chest pain where his pacemaker is placed. Vitals per EMS: 114/56, 114 HR afib with HX, 16 RR, 96% on RA.

## 2013-11-29 NOTE — ED Notes (Signed)
MD Harrison at the bedside.  

## 2013-11-30 ENCOUNTER — Telehealth: Payer: Self-pay | Admitting: Internal Medicine

## 2013-11-30 MED ORDER — TRAMADOL HCL 50 MG PO TABS: 50.0000 mg | ORAL_TABLET | Freq: Four times a day (QID) | ORAL | Status: AC | PRN

## 2013-11-30 MED ORDER — DILTIAZEM HCL 30 MG PO TABS: 30.0000 mg | ORAL_TABLET | ORAL | Status: DC

## 2013-11-30 NOTE — Evaluation (Addendum)
Received call about pt with persistent tachycardia s/p fall. Pt fell playing with kids yesterday. Came today to eval for pain s/p mechanical fall. Pt with baseline hx/o afib on pradaxa. Multiple xrays, head CT and work up negative thus far-multiple medical problems at baseline. Pt missed some of his medications today. HR was in 150s. Improved to 100s-120 once pt received home dose metoprolol and cardizem and pain medication. EDP discussed case with on call cardiology about tachycardia. Cards recommended IV metoprolol low dose x1. EDP decided to hold this b/c SBPs in 100s. Discussed BP and HR w/ cards again who then recommended po dilt. Note: Pt was not given oral digoxin which was missed today. EDP asking for admission because of tachycardia though HR improving. Discussed case with cardiology who happened to be sitting near me when I received call about pt. Tachycardia likely secondary to missed medications in setting of fall and pain. No reported CP, SOB. Satting >97% on RA. Otherwise clinically stable. EDP is agreeable to go with plan of oral dilt x1 with close observation as cardiology previously discussed. Plan for admission if HR still persistently elevated despite cardiology recommendations or if clinical condition deteriorates.

## 2013-11-30 NOTE — Discharge Instructions (Signed)
Contusion °A contusion is a deep bruise. Contusions happen when an injury causes bleeding under the skin. Signs of bruising include pain, puffiness (swelling), and discolored skin. The contusion may turn blue, purple, or yellow. °HOME CARE  °· Put ice on the injured area. °¨ Put ice in a plastic bag. °¨ Place a towel between your skin and the bag. °¨ Leave the ice on for 15-20 minutes, 03-04 times a day. °· Only take medicine as told by your doctor. °· Rest the injured area. °· If possible, raise (elevate) the injured area to lessen puffiness. °GET HELP RIGHT AWAY IF:  °· You have more bruising or puffiness. °· You have pain that is getting worse. °· Your puffiness or pain is not helped by medicine. °MAKE SURE YOU:  °· Understand these instructions. °· Will watch your condition. °· Will get help right away if you are not doing well or get worse. °Document Released: 09/18/2007 Document Revised: 06/24/2011 Document Reviewed: 02/04/2011 °[employer]® Patient Information ©2015 [employer]. This information is not intended to replace advice given to you by your health care provider. Make sure you discuss any questions you have with your health care provider. ° °

## 2013-11-30 NOTE — Telephone Encounter (Signed)
Need a refill on his Metolazone 5 mg #30. Please call to [phone]

## 2013-11-30 NOTE — ED Notes (Signed)
Pt placed in blue paper scrubs, pt came to ED with under cloth on.

## 2013-12-01 MED ORDER — METOLAZONE 5 MG PO TABS: 5.0000 mg | ORAL_TABLET | Freq: Every day | ORAL | Status: AC

## 2013-12-01 MED ORDER — METOLAZONE 5 MG PO TABS: 5.0000 mg | ORAL_TABLET | Freq: Every day | ORAL | Status: DC

## 2013-12-01 NOTE — Addendum Note (Signed)
Addended by: Fidel Levy on: 12/01/2013 11:15 AM   Modules accepted: Orders

## 2013-12-01 NOTE — Telephone Encounter (Signed)
Rx was sent to pharmacy electronically. 

## 2013-12-02 ENCOUNTER — Inpatient Hospital Stay (HOSPITAL_COMMUNITY)
Admission: EM | Admit: 2013-12-02 | Discharge: 2013-12-14 | DRG: 871 | Disposition: E | Payer: Medicare Other | Attending: Internal Medicine | Admitting: Internal Medicine

## 2013-12-02 ENCOUNTER — Emergency Department (HOSPITAL_COMMUNITY): Payer: Medicare Other

## 2013-12-02 ENCOUNTER — Encounter (HOSPITAL_COMMUNITY): Payer: Self-pay | Admitting: Emergency Medicine

## 2013-12-02 DIAGNOSIS — E872 Acidosis, unspecified: Secondary | ICD-10-CM | POA: Diagnosis present

## 2013-12-02 DIAGNOSIS — N12 Tubulo-interstitial nephritis, not specified as acute or chronic: Secondary | ICD-10-CM | POA: Diagnosis present

## 2013-12-02 DIAGNOSIS — R7881 Bacteremia: Secondary | ICD-10-CM

## 2013-12-02 DIAGNOSIS — Z79899 Other long term (current) drug therapy: Secondary | ICD-10-CM | POA: Diagnosis not present

## 2013-12-02 DIAGNOSIS — N1330 Unspecified hydronephrosis: Secondary | ICD-10-CM | POA: Diagnosis present

## 2013-12-02 DIAGNOSIS — G4733 Obstructive sleep apnea (adult) (pediatric): Secondary | ICD-10-CM | POA: Diagnosis present

## 2013-12-02 DIAGNOSIS — I4891 Unspecified atrial fibrillation: Secondary | ICD-10-CM | POA: Diagnosis present

## 2013-12-02 DIAGNOSIS — I129 Hypertensive chronic kidney disease with stage 1 through stage 4 chronic kidney disease, or unspecified chronic kidney disease: Secondary | ICD-10-CM | POA: Diagnosis present

## 2013-12-02 DIAGNOSIS — D631 Anemia in chronic kidney disease: Secondary | ICD-10-CM | POA: Diagnosis present

## 2013-12-02 DIAGNOSIS — C61 Malignant neoplasm of prostate: Secondary | ICD-10-CM | POA: Diagnosis present

## 2013-12-02 DIAGNOSIS — Z7901 Long term (current) use of anticoagulants: Secondary | ICD-10-CM | POA: Diagnosis not present

## 2013-12-02 DIAGNOSIS — I495 Sick sinus syndrome: Secondary | ICD-10-CM | POA: Diagnosis present

## 2013-12-02 DIAGNOSIS — E119 Type 2 diabetes mellitus without complications: Secondary | ICD-10-CM | POA: Diagnosis present

## 2013-12-02 DIAGNOSIS — N179 Acute kidney failure, unspecified: Secondary | ICD-10-CM

## 2013-12-02 DIAGNOSIS — R778 Other specified abnormalities of plasma proteins: Secondary | ICD-10-CM

## 2013-12-02 DIAGNOSIS — E8809 Other disorders of plasma-protein metabolism, not elsewhere classified: Secondary | ICD-10-CM | POA: Diagnosis present

## 2013-12-02 DIAGNOSIS — R5381 Other malaise: Secondary | ICD-10-CM | POA: Diagnosis present

## 2013-12-02 DIAGNOSIS — Z515 Encounter for palliative care: Secondary | ICD-10-CM

## 2013-12-02 DIAGNOSIS — I10 Essential (primary) hypertension: Secondary | ICD-10-CM | POA: Diagnosis present

## 2013-12-02 DIAGNOSIS — N183 Chronic kidney disease, stage 3 unspecified: Secondary | ICD-10-CM

## 2013-12-02 DIAGNOSIS — E039 Hypothyroidism, unspecified: Secondary | ICD-10-CM | POA: Diagnosis present

## 2013-12-02 DIAGNOSIS — N289 Disorder of kidney and ureter, unspecified: Secondary | ICD-10-CM

## 2013-12-02 DIAGNOSIS — D696 Thrombocytopenia, unspecified: Secondary | ICD-10-CM | POA: Diagnosis present

## 2013-12-02 DIAGNOSIS — N189 Chronic kidney disease, unspecified: Secondary | ICD-10-CM

## 2013-12-02 DIAGNOSIS — Z794 Long term (current) use of insulin: Secondary | ICD-10-CM | POA: Diagnosis not present

## 2013-12-02 DIAGNOSIS — G929 Unspecified toxic encephalopathy: Secondary | ICD-10-CM | POA: Diagnosis present

## 2013-12-02 DIAGNOSIS — I513 Intracardiac thrombosis, not elsewhere classified: Secondary | ICD-10-CM | POA: Diagnosis present

## 2013-12-02 DIAGNOSIS — I422 Other hypertrophic cardiomyopathy: Secondary | ICD-10-CM | POA: Diagnosis present

## 2013-12-02 DIAGNOSIS — I428 Other cardiomyopathies: Secondary | ICD-10-CM | POA: Diagnosis present

## 2013-12-02 DIAGNOSIS — I959 Hypotension, unspecified: Secondary | ICD-10-CM | POA: Diagnosis present

## 2013-12-02 DIAGNOSIS — I509 Heart failure, unspecified: Secondary | ICD-10-CM | POA: Diagnosis present

## 2013-12-02 DIAGNOSIS — E43 Unspecified severe protein-calorie malnutrition: Secondary | ICD-10-CM | POA: Diagnosis present

## 2013-12-02 DIAGNOSIS — I480 Paroxysmal atrial fibrillation: Secondary | ICD-10-CM | POA: Diagnosis present

## 2013-12-02 DIAGNOSIS — I5189 Other ill-defined heart diseases: Secondary | ICD-10-CM | POA: Diagnosis present

## 2013-12-02 DIAGNOSIS — Z9181 History of falling: Secondary | ICD-10-CM | POA: Diagnosis not present

## 2013-12-02 DIAGNOSIS — Z833 Family history of diabetes mellitus: Secondary | ICD-10-CM | POA: Diagnosis not present

## 2013-12-02 DIAGNOSIS — N184 Chronic kidney disease, stage 4 (severe): Secondary | ICD-10-CM | POA: Diagnosis present

## 2013-12-02 DIAGNOSIS — R7989 Other specified abnormal findings of blood chemistry: Secondary | ICD-10-CM | POA: Diagnosis present

## 2013-12-02 DIAGNOSIS — Z66 Do not resuscitate: Secondary | ICD-10-CM | POA: Diagnosis not present

## 2013-12-02 DIAGNOSIS — G92 Toxic encephalopathy: Secondary | ICD-10-CM | POA: Diagnosis present

## 2013-12-02 DIAGNOSIS — D649 Anemia, unspecified: Secondary | ICD-10-CM | POA: Diagnosis present

## 2013-12-02 DIAGNOSIS — N170 Acute kidney failure with tubular necrosis: Secondary | ICD-10-CM | POA: Diagnosis present

## 2013-12-02 DIAGNOSIS — Z8042 Family history of malignant neoplasm of prostate: Secondary | ICD-10-CM | POA: Diagnosis not present

## 2013-12-02 DIAGNOSIS — R6520 Severe sepsis without septic shock: Secondary | ICD-10-CM | POA: Diagnosis present

## 2013-12-02 DIAGNOSIS — N19 Unspecified kidney failure: Secondary | ICD-10-CM

## 2013-12-02 DIAGNOSIS — G9341 Metabolic encephalopathy: Secondary | ICD-10-CM

## 2013-12-02 DIAGNOSIS — R627 Adult failure to thrive: Secondary | ICD-10-CM | POA: Diagnosis present

## 2013-12-02 DIAGNOSIS — R5383 Other fatigue: Secondary | ICD-10-CM | POA: Diagnosis present

## 2013-12-02 DIAGNOSIS — Z9861 Coronary angioplasty status: Secondary | ICD-10-CM | POA: Diagnosis not present

## 2013-12-02 DIAGNOSIS — I5042 Chronic combined systolic (congestive) and diastolic (congestive) heart failure: Secondary | ICD-10-CM | POA: Diagnosis present

## 2013-12-02 DIAGNOSIS — I2789 Other specified pulmonary heart diseases: Secondary | ICD-10-CM | POA: Diagnosis present

## 2013-12-02 DIAGNOSIS — Z950 Presence of cardiac pacemaker: Secondary | ICD-10-CM | POA: Diagnosis not present

## 2013-12-02 DIAGNOSIS — R319 Hematuria, unspecified: Secondary | ICD-10-CM | POA: Diagnosis present

## 2013-12-02 DIAGNOSIS — Z87891 Personal history of nicotine dependence: Secondary | ICD-10-CM | POA: Diagnosis not present

## 2013-12-02 DIAGNOSIS — I5032 Chronic diastolic (congestive) heart failure: Secondary | ICD-10-CM | POA: Diagnosis present

## 2013-12-02 DIAGNOSIS — M109 Gout, unspecified: Secondary | ICD-10-CM | POA: Diagnosis present

## 2013-12-02 DIAGNOSIS — A409 Streptococcal sepsis, unspecified: Secondary | ICD-10-CM | POA: Diagnosis present

## 2013-12-02 LAB — URINALYSIS, ROUTINE W REFLEX MICROSCOPIC
GLUCOSE, UA: NEGATIVE mg/dL
Ketones, ur: 15 mg/dL — AB
NITRITE: NEGATIVE
PH: 5 (ref 5.0–8.0)
Protein, ur: 30 mg/dL — AB
SPECIFIC GRAVITY, URINE: 1.019 (ref 1.005–1.030)
Urobilinogen, UA: 1 mg/dL (ref 0.0–1.0)

## 2013-12-02 LAB — BASIC METABOLIC PANEL
Anion gap: 28 — ABNORMAL HIGH (ref 5–15)
BUN: 128 mg/dL — ABNORMAL HIGH (ref 6–23)
CO2: 17 mEq/L — ABNORMAL LOW (ref 19–32)
Calcium: 9.2 mg/dL (ref 8.4–10.5)
Chloride: 93 mEq/L — ABNORMAL LOW (ref 96–112)
Creatinine, Ser: 4.56 mg/dL — ABNORMAL HIGH (ref 0.50–1.35)
GFR calc Af Amer: 13 mL/min — ABNORMAL LOW (ref >90)
GFR calc non Af Amer: 11 mL/min — ABNORMAL LOW (ref >90)
Glucose, Bld: 180 mg/dL — ABNORMAL HIGH (ref 70–99)
Potassium: 4 mEq/L (ref 3.7–5.3)
Sodium: 138 mEq/L (ref 137–147)

## 2013-12-02 LAB — TROPONIN I
Troponin I: 0.51 ng/mL (ref <0.30)
Troponin I: 0.53 ng/mL (ref <0.30)

## 2013-12-02 LAB — HEPATIC FUNCTION PANEL
ALT: 17 U/L (ref 0–53)
AST: 45 U/L — ABNORMAL HIGH (ref 0–37)
Albumin: 2.3 g/dL — ABNORMAL LOW (ref 3.5–5.2)
Alkaline Phosphatase: 257 U/L — ABNORMAL HIGH (ref 39–117)
Bilirubin, Direct: 2.4 mg/dL — ABNORMAL HIGH (ref 0.0–0.3)
Indirect Bilirubin: 0.3 mg/dL (ref 0.3–0.9)
Total Bilirubin: 2.7 mg/dL — ABNORMAL HIGH (ref 0.3–1.2)
Total Protein: 7.1 g/dL (ref 6.0–8.3)

## 2013-12-02 LAB — CBC
HCT: 29.8 % — ABNORMAL LOW (ref 39.0–52.0)
Hemoglobin: 9.9 g/dL — ABNORMAL LOW (ref 13.0–17.0)
MCH: 31.7 pg (ref 26.0–34.0)
MCHC: 33.2 g/dL (ref 30.0–36.0)
MCV: 95.5 fL (ref 78.0–100.0)
Platelets: 117 10*3/uL — ABNORMAL LOW (ref 150–400)
RBC: 3.12 MIL/uL — ABNORMAL LOW (ref 4.22–5.81)
RDW: 15.1 % (ref 11.5–15.5)
WBC: 14.8 10*3/uL — ABNORMAL HIGH (ref 4.0–10.5)

## 2013-12-02 LAB — URINE MICROSCOPIC-ADD ON

## 2013-12-02 LAB — I-STAT TROPONIN, ED: Troponin i, poc: 0.44 ng/mL (ref 0.00–0.08)

## 2013-12-02 LAB — PRO B NATRIURETIC PEPTIDE: Pro B Natriuretic peptide (BNP): 22168 pg/mL — ABNORMAL HIGH (ref 0–450)

## 2013-12-02 LAB — DIGOXIN LEVEL

## 2013-12-02 LAB — TSH: TSH: 2.7 u[IU]/mL (ref 0.350–4.500)

## 2013-12-02 LAB — I-STAT CG4 LACTIC ACID, ED: LACTIC ACID, VENOUS: 4.41 mmol/L — AB (ref 0.5–2.2)

## 2013-12-02 LAB — LIPASE, BLOOD: Lipase: 56 U/L (ref 11–59)

## 2013-12-02 LAB — AMMONIA: Ammonia: 17 umol/L (ref 11–60)

## 2013-12-02 MED ORDER — LEVOTHYROXINE SODIUM 88 MCG PO TABS
88.0000 ug | ORAL_TABLET | Freq: Every day | ORAL | Status: DC
  Administered 2013-12-03 – 2013-12-05 (×3): 88 ug via ORAL
  Filled 2013-12-02 (×5): qty 1

## 2013-12-02 MED ORDER — SODIUM CHLORIDE 0.9 % IJ SOLN
3.0000 mL | Freq: Two times a day (BID) | INTRAMUSCULAR | Status: DC
  Administered 2013-12-02 – 2013-12-08 (×7): 3 mL via INTRAVENOUS

## 2013-12-02 MED ORDER — POLYETHYLENE GLYCOL 3350 17 G PO PACK: 17.0000 g | PACK | Freq: Every day | ORAL | Status: DC | PRN

## 2013-12-02 MED ORDER — DILTIAZEM HCL 100 MG IV SOLR
5.0000 mg/h | Freq: Once | INTRAVENOUS | Status: AC
  Administered 2013-12-02: 10 mg/h via INTRAVENOUS

## 2013-12-02 MED ORDER — SODIUM CHLORIDE 0.9 % IV SOLN
INTRAVENOUS | Status: DC
  Administered 2013-12-02: 15:00:00 via INTRAVENOUS

## 2013-12-02 MED ORDER — DILTIAZEM HCL 25 MG/5ML IV SOLN
10.0000 mg | Freq: Once | INTRAVENOUS | Status: AC
  Administered 2013-12-02: 10 mg via INTRAVENOUS
  Filled 2013-12-02: qty 5

## 2013-12-02 MED ORDER — DIGOXIN 125 MCG PO TABS
0.1250 mg | ORAL_TABLET | ORAL | Status: DC
  Administered 2013-12-02 – 2013-12-04 (×2): 0.125 mg via ORAL
  Filled 2013-12-02 (×2): qty 1

## 2013-12-02 MED ORDER — SODIUM CHLORIDE 0.9 % IV SOLN
INTRAVENOUS | Status: DC
  Administered 2013-12-03 – 2013-12-04 (×3): via INTRAVENOUS
  Administered 2013-12-05: 100 mL via INTRAVENOUS
  Administered 2013-12-05: 08:00:00 via INTRAVENOUS
  Administered 2013-12-06: 100 mL/h via INTRAVENOUS
  Administered 2013-12-06: 30 mL/h via INTRAVENOUS

## 2013-12-02 MED ORDER — ASPIRIN EC 81 MG PO TBEC
81.0000 mg | DELAYED_RELEASE_TABLET | Freq: Every day | ORAL | Status: DC
  Administered 2013-12-03 – 2013-12-04 (×2): 81 mg via ORAL
  Filled 2013-12-02 (×2): qty 1

## 2013-12-02 MED ORDER — METOPROLOL TARTRATE 25 MG PO TABS
50.0000 mg | ORAL_TABLET | Freq: Once | ORAL | Status: AC
  Administered 2013-12-02: 50 mg via ORAL
  Filled 2013-12-02: qty 2

## 2013-12-02 MED ORDER — HEPARIN SODIUM (PORCINE) 5000 UNIT/ML IJ SOLN
5000.0000 [IU] | Freq: Three times a day (TID) | INTRAMUSCULAR | Status: DC
  Administered 2013-12-03 – 2013-12-04 (×4): 5000 [IU] via SUBCUTANEOUS
  Filled 2013-12-02 (×7): qty 1

## 2013-12-02 MED ORDER — LEVALBUTEROL HCL 0.63 MG/3ML IN NEBU: 0.6300 mg | INHALATION_SOLUTION | Freq: Four times a day (QID) | RESPIRATORY_TRACT | Status: DC | PRN

## 2013-12-02 MED ORDER — MORPHINE SULFATE 2 MG/ML IJ SOLN
2.0000 mg | INTRAMUSCULAR | Status: DC | PRN
  Administered 2013-12-02: 4 mg via INTRAVENOUS
  Filled 2013-12-02 (×2): qty 2

## 2013-12-02 MED ORDER — MORPHINE SULFATE 2 MG/ML IJ SOLN
2.0000 mg | Freq: Once | INTRAMUSCULAR | Status: AC
  Administered 2013-12-02: 2 mg via INTRAVENOUS
  Filled 2013-12-02: qty 1

## 2013-12-02 MED ORDER — ONDANSETRON HCL 4 MG/2ML IJ SOLN: 4.0000 mg | Freq: Four times a day (QID) | INTRAMUSCULAR | Status: DC | PRN

## 2013-12-02 MED ORDER — ONDANSETRON HCL 4 MG PO TABS: 4.0000 mg | ORAL_TABLET | Freq: Four times a day (QID) | ORAL | Status: DC | PRN

## 2013-12-02 MED ORDER — DIGOXIN 125 MCG PO TABS
0.2500 mg | ORAL_TABLET | Freq: Once | ORAL | Status: AC
  Administered 2013-12-02: 0.25 mg via ORAL
  Filled 2013-12-02: qty 2

## 2013-12-02 MED ORDER — FENTANYL CITRATE 0.05 MG/ML IJ SOLN
50.0000 ug | Freq: Once | INTRAMUSCULAR | Status: AC
  Administered 2013-12-02: 50 ug via INTRAVENOUS
  Filled 2013-12-02: qty 2

## 2013-12-02 MED ORDER — ASPIRIN 81 MG PO CHEW
324.0000 mg | CHEWABLE_TABLET | Freq: Once | ORAL | Status: AC
  Administered 2013-12-02: 324 mg via ORAL
  Filled 2013-12-02: qty 4

## 2013-12-02 MED ORDER — ACETAMINOPHEN 325 MG PO TABS: 650.0000 mg | ORAL_TABLET | Freq: Four times a day (QID) | ORAL | Status: DC | PRN

## 2013-12-02 NOTE — ED Notes (Signed)
Per EMS- Pt comes from home reports failure to thrive hasn't been eating or taking meds for 1 week. Is weak, skin appears dry. Pt is alert, "I just hurt". EKG AFIB. EMS gave him 500 NS, reports this improved his mental status. HR 132 AFIB, BP 132/60, CBG 255.

## 2013-12-02 NOTE — H&P (Addendum)
[facility] History and Physical  Vincent [name] OHY:[id number] DOB: 09/08/1935 DOA: 11/19/2013  Referring physician: EDP PCP: [name], MD   Chief Complaint: weakness, pain all over  HPI: Vincent [name] is a 78 y.o. male  With h/o advanced prostate cancer, atrial fibrillation, CKD, systolic and diastolic heart failure, diabetes comes to ED with "pain all over" and weakness. Has not been taking medications for over a week, not eating. [place] and was evaluated in ED several days ago. Since then, has been largely immobile in bed.  Patient provides little history, so most is from daughter and review of records. In ED, found to be in atrial fibrillation with rate about 130. Blood pressure 90 systolic. Bun creatinine 128/4.5. Creatinine was 3.4 3 days ago. Baseline creatinine appears to be 1.7- 3.0.  Troponin 0.4. Denies chest pain. Received 500 cc bolus en route to ED. Per daughter, has been disoriented.   Review of Systems:  Difficult due to patient factors.  Past Medical History  Diagnosis Date  . Hypertension   . CHF (congestive heart failure)     EF 40-45% by Echo, combined Systolic & Diastolic  . Irregular heartbeat   . GERD (gastroesophageal reflux disease)   . Diastolic dysfunction, left ventricle 12/18/2011  . Atrial fibrillation with rapid ventricular response, history of PAF was in SR in 08/2011 12/18/2011  . Fecal impaction 04/13/2012  . Left atrial thrombus 12/2011    By TEE 12/2011  . CKD (chronic kidney disease) stage 3, GFR 30-59 ml/min 12/26/2011  . Pneumonia 03/31/2012; 04/13/2012    Healthcare-associated pneumonia/notes 04/13/2012  . PAF (paroxysmal atrial fibrillation) 04/15/2012    TEE in 12/2011 with LAA thrombus - no DCCV   . Type II diabetes mellitus   . Arthritis     SHOULDERS  . Symptomatic bradycardia 05/29/2012  . Tachycardia-bradycardia syndrome 05/29/2012  . Nonischemic cardiomyopathy     h/o   . Hypothyroid, recently diagnosed  01/26/2013  . Pacemaker     . Orthopnea   . Gout   . Prostate cancer    Past Surgical History  Procedure Laterality Date  . Prostate surgery    . Tee without cardioversion  12/27/2011    Procedure: TRANSESOPHAGEAL ECHOCARDIOGRAM (TEE);  Surgeon: Pixie Casino, MD;  Location: [facility] OR;  Service: Cardiovascular;  Laterality: N/A;  MD request Main OR - EF 40-45%, mild conc LVH, systolic function mild-mod reduced; LA mod-severely dialted with uniobular appendage  . Cardioversion  12/27/2011    Procedure: CARDIOVERSION;  Surgeon: Pixie Casino, MD;  Location: [facility] OR;  Service: Cardiovascular;  Laterality: N/A;  . Coronary angioplasty with stent placement    . Cardioversion N/A 05/27/2012    Procedure: CARDIOVERSION;  Surgeon: Pixie Casino, MD;  Location: [facility];  Service: Cardiovascular;  Laterality: N/A;  . Pacemaker insertion  05/2011    [employer], Model [id number], Serial [id number]  . Cardiac catheterization  05/28/2007    patent coronaries, EF 50%, mod pulm arterial htn (Dr. Melvern Banker)  . Insert / replace / remove pacemaker     Social History:  reports that he quit smoking about 33 years ago. His smoking use included Cigarettes. He has a 25 pack-year smoking history. He has never used smokeless tobacco. He reports that he drinks alcohol. He reports that he does not use illicit drugs.  No Known Allergies  Family History  Problem Relation Age of Onset  . Diabetes Mellitus II Mother   . Sudden death Mother   .  Prostate cancer Father      Prior to Admission medications   Medication Sig Start Date End Date Taking? Authorizing Provider  dabigatran (PRADAXA) 75 MG CAPS capsule Take 75 mg by mouth 2 (two) times daily.   Yes Historical Provider, MD  furosemide (LASIX) 40 MG tablet Take 40 mg by mouth 2 (two) times daily.  03/30/13  Yes Pixie Casino, MD  acetaminophen (TYLENOL) 325 MG tablet Take 650 mg by mouth every 6 (six) hours as needed for moderate pain.    Historical Provider, MD  allopurinol  (ZYLOPRIM) 100 MG tablet Take 100 mg by mouth daily.     Historical Provider, MD  digoxin (LANOXIN) 0.25 MG tablet Take 1 tablet every other day. 06/17/13   Leonie Man, MD  diltiazem (CARDIZEM CD) 240 MG 24 hr capsule Take 240 mg by mouth daily.    Historical Provider, MD  docusate sodium (COLACE) 100 MG capsule Take 1 capsule (100 mg total) by mouth 2 (two) times daily as needed for constipation. 05/29/12   Cecilie Kicks, NP  hydrALAZINE (APRESOLINE) 50 MG tablet Take 50 mg by mouth 2 (two) times daily.    Historical Provider, MD  insulin aspart protamine- aspart (NOVOLOG MIX 70/30) (70-30) 100 UNIT/ML injection Inject 20-65 Units into the skin every evening. 65 units in am (sliding scale) and 20 units in pm    Historical Provider, MD  levothyroxine (SYNTHROID, LEVOTHROID) 88 MCG tablet Take 88 mcg by mouth daily before breakfast.    Historical Provider, MD  metolazone (ZAROXOLYN) 5 MG tablet Take 1 tablet (5 mg total) by mouth daily. 12/01/13   Pixie Casino, MD  metoprolol (LOPRESSOR) 100 MG tablet Take 50 mg by mouth 2 (two) times daily.    Historical Provider, MD  naproxen sodium (ANAPROX) 220 MG tablet Take 220 mg by mouth daily as needed (for pain).    Historical Provider, MD  pantoprazole (PROTONIX) 40 MG tablet Take 40 mg by mouth daily.    Historical Provider, MD  polyethylene glycol (MIRALAX / GLYCOLAX) packet Take 17 g by mouth daily as needed. For constipation    Historical Provider, MD  potassium chloride SA (K-DUR,KLOR-CON) 20 MEQ tablet Take 2 tablets (40 mEq total) by mouth 2 (two) times daily. 02/08/13   Pixie Casino, MD  traMADol (ULTRAM) 50 MG tablet Take 1 tablet (50 mg total) by mouth every 6 (six) hours as needed. 11/30/13   Pamella Pert, MD   Physical Exam: Filed Vitals:   12/03/2013 1432 11/15/2013 1440 11/29/2013 1522 12/01/2013 1744  BP: 115/63 90/41 99/47  120/59  Pulse: 147 114 171 129  Temp: 97.2 F (36.2 C)     TempSrc: Oral     Resp: 16 22 17 16   SpO2: 99% 99%  100% 99%    Wt Readings from Last 3 Encounters:  11/29/13 98.431 kg (217 lb)  08/27/13 97.478 kg (214 lb 14.4 oz)  07/09/13 100.336 kg (221 lb 3.2 oz)   BP 128/49  Pulse 74  Temp(Src) 98.2 F (36.8 C) (Oral)  Resp 20  SpO2 99%  General Appearance:    Uncomfortable, chronically ill appearing. Disoriented to time.  Head:    Bitemporal wasting  Eyes:    PERRL, muddy sclera          Nose:   Nares normal, septum midline, mucosa normal, no drainage   or sinus tenderness  Throat:   Extremely dry mucous membranes with dried blood on teeth  Neck:   Supple,  no LA or thyromegaly.      Lungs:     Clear to auscultation bilaterally anteriorly without WRR  Chest wall:    No tenderness or deformity  Heart:    irreg irreg, rapid. No appreciable MGR  Abdomen:     Soft, non-tender, bowel sounds active all four quadrants,    no masses, no organomegaly  Genitalia:    deferred  Rectal:    deferred  Extremities:   Extremities normal, atraumatic, no cyanosis or 2+ edema     Skin:   No rash  Lymph nodes:   Cervical, supraclavicular, and axillary nodes normal  Neurologic:   CNII-XII intact. Global weakness           Psych: cooperative.  Labs on Admission:  Basic Metabolic Panel:  Recent Labs Lab 11/29/13 2129 12/11/2013 1450  NA 137 138  K 3.6* 4.0  CL 96 93*  CO2 21 17*  GLUCOSE 142* 180*  BUN 98* 128*  CREATININE 3.44* 4.56*  CALCIUM 9.3 9.2   Liver Function Tests:  Recent Labs Lab 11/29/13 2129 11/19/2013 1703  AST 30 45*  ALT 11 17  ALKPHOS 349* 257*  BILITOT 0.8 2.7*  PROT 7.7 7.1  ALBUMIN 2.7* 2.3*    Recent Labs Lab 11/15/2013 1703  LIPASE 56   No results found for this basename: AMMONIA,  in the last 168 hours CBC:  Recent Labs Lab 11/29/13 2129 12/12/2013 1450  WBC 11.4* 14.8*  NEUTROABS 10.8*  --   HGB 10.2* 9.9*  HCT 30.9* 29.8*  MCV 95.1 95.5  PLT 144* 117*   Cardiac Enzymes:  Recent Labs Lab 12/01/2013 1618  TROPONINI 0.51*    BNP (last 3  results)  Recent Labs  01/31/13 0100 02/02/13 0550 11/28/2013 1450  PROBNP 3302.0* 6692.0* 22168.0*   CBG: No results found for this basename: GLUCAP,  in the last 168 hours  Radiological Exams on Admission: Dg Chest Port 1 View  11/29/2013   CLINICAL DATA:  Chest pain.  EXAM: PORTABLE CHEST - 1 VIEW  COMPARISON:  None.  FINDINGS: Mediastinum and hilar structures normal. Heart size normal. Previously noted with lead tips in the right atrium and right ventricle. No pleural effusion or pneumothorax. No acute bony abnormality. Old right fifth rib fracture noted .  IMPRESSION: 1. No acute cardiopulmonary disease. 2. Cardiac pacer and lead tips in right atrium and right ventricle. 3. Old right posterior fifth rib fracture.   Electronically Signed   By: Marcello Moores  Register   On: 11/24/2013 14:50    EKG: atrial fib, rapid with LBBB  Assessment/Plan Principal Problem:   Atrial fibrillation with rapid ventricular response: continue cardizem gtt as blood pressure tolerates. Resume low dose digoxin qod. Step down. Intravascular volume depletion contributing  Active Problems:    Prostate cancer, advanced. Was sheduled to have CT chest abd pelvis in may, but missed appointment. Last PSA 125.    Chronic diastolic CHF (congestive heart failure)    DM2 (diabetes mellitus, type 2): SSI    LA thrombus on echo Feb 2014    Obstructive sleep apnea, C-pap intol    Tachycardia-bradycardia syndrome, s/p [employer] PTVDP Feb 2014    Hypothyroid, check TSH    Non ischemic cardiomyopathy- EF 40-45% 2D Feb 2014: proBNP high, but ARF likely contributing. Has edema, but otherwise, appears profoundly volume depleted. Hold diuretics and give IVF overnight    Acute renal failure: likely from rapid a fib, poor po intake, cardiorenal. Likely not a candidate  for dialysis due to multiple medical problems and advanced prostate cancer. Will place foley and check abdominal US.    Protein-calorie malnutrition,  severe    Elevated troponin: no chest pain. ASA for now and trend troponins. May be related to renal failure and atrial fib/RVR    Chronic anemia    Metabolic acidosis: likely secondary to relative hypotension, a fib, etc    Thrombocytopenia, unspecified   Prognosis appears extremely poor. Prognosis likely much less than 6 months. Broached subject of comfort care with daughter. She wishes to discuss with family, but agreeable to palliative care consult for goals of care.  Code Status: full Family Communication: daughter at bedside Disposition Plan:   Time spent: 42 min  [facility] Pager [phone]

## 2013-12-02 NOTE — ED Provider Notes (Signed)
CSN: [id number]     Arrival date & time 11/28/2013  1426 History   First MD Initiated Contact with Patient 11/20/2013 1459     Chief Complaint  Patient presents with  . Failure To Thrive     (Consider location/radiation/quality/duration/timing/severity/associated sxs/prior Treatment) HPI  Vincent [name] is a 78 y.o. male who has a history of mild nonischemic cardiomyopathy, EF of about 45%, and chronic combined systolic and diastolic heart failure, PAF on Cardizem, Lopressor and Digoxin, with insulin-dependent diabetes. On Pradaxa for stroke prophylaxis. His PPM was inserted in February 2013. Cardiologist Dr Debara Pickett. PCP Jeanann Lewandowsky.    Presenting for evaluation of "disorientation." Pt lives at home with wife. An aid called daughter because pt seem confused and then ultimately EMS. Pt was just seen in the ED 8/18 after he had fallen three days before then. Was in afib in RVR then. He had not been taking his medications consistently at that time. From review of records, it seems HR was felt to have been driven by pain/not taking meds. Since discharge he continues to be in pain and still not taking his medications. Denies any new trauma. Says he hurts all over. Daughter says he seems uncomfortable but not really confused to her. No fever. No acute urinary complaints. No CP. Does feel SOB. Has not been eating or drinking well. No cough. LE edema noted, but pt cannot tell me if this is new/changed.    Past Medical History  Diagnosis Date  . Hypertension   . CHF (congestive heart failure)     EF 40-45% by Echo, combined Systolic & Diastolic  . Irregular heartbeat   . GERD (gastroesophageal reflux disease)   . Diastolic dysfunction, left ventricle 12/18/2011  . Atrial fibrillation with rapid ventricular response, history of PAF was in SR in 08/2011 12/18/2011  . Fecal impaction 04/13/2012  . Left atrial thrombus 12/2011    By TEE 12/2011  . CKD (chronic kidney disease) stage 3, GFR 30-59 ml/min 12/26/2011   . Pneumonia 03/31/2012; 04/13/2012    Healthcare-associated pneumonia/notes 04/13/2012  . PAF (paroxysmal atrial fibrillation) 04/15/2012    TEE in 12/2011 with LAA thrombus - no DCCV   . Type II diabetes mellitus   . Arthritis     SHOULDERS  . Symptomatic bradycardia 05/29/2012  . Tachycardia-bradycardia syndrome 05/29/2012  . Nonischemic cardiomyopathy     h/o   . Hypothyroid, recently diagnosed  01/26/2013  . Pacemaker   . Orthopnea   . Gout   . Prostate cancer    Past Surgical History  Procedure Laterality Date  . Prostate surgery    . Tee without cardioversion  12/27/2011    Procedure: TRANSESOPHAGEAL ECHOCARDIOGRAM (TEE);  Surgeon: Pixie Casino, MD;  Location: [facility] OR;  Service: Cardiovascular;  Laterality: N/A;  MD request Main OR - EF 40-45%, mild conc LVH, systolic function mild-mod reduced; LA mod-severely dialted with uniobular appendage  . Cardioversion  12/27/2011    Procedure: CARDIOVERSION;  Surgeon: Pixie Casino, MD;  Location: [facility] OR;  Service: Cardiovascular;  Laterality: N/A;  . Coronary angioplasty with stent placement    . Cardioversion N/A 05/27/2012    Procedure: CARDIOVERSION;  Surgeon: Pixie Casino, MD;  Location: [place];  Service: Cardiovascular;  Laterality: N/A;  . Pacemaker insertion  05/2011    [employer], Model [id number], Serial [id number]  . Cardiac catheterization  05/28/2007    patent coronaries, EF 50%, mod pulm arterial htn (Dr. Melvern Banker)  . Insert / replace /  remove pacemaker     Family History  Problem Relation Age of Onset  . Diabetes Mellitus II Mother   . Sudden death Mother   . Prostate cancer Father    History  Substance Use Topics  . Smoking status: Former Smoker -- 1.00 packs/day for 25 years    Types: Cigarettes    Quit date: 05/26/1980  . Smokeless tobacco: Never Used  . Alcohol Use: Yes     Comment: 01/26/2013 "cocktail couple times/year"    Review of Systems  All systems reviewed and negative, other than as  noted in HPI.   Allergies  Review of patient's allergies indicates no known allergies.  Home Medications   Prior to Admission medications   Medication Sig Start Date End Date Taking? Authorizing Provider  acetaminophen (TYLENOL) 325 MG tablet Take 650 mg by mouth every 6 (six) hours as needed for moderate pain.    Historical Provider, MD  allopurinol (ZYLOPRIM) 100 MG tablet Take 100 mg by mouth daily.     Historical Provider, MD  dabigatran (PRADAXA) 75 MG CAPS capsule Take 75 mg by mouth 2 (two) times daily.    Historical Provider, MD  digoxin (LANOXIN) 0.25 MG tablet Take 1 tablet every other day. 06/17/13   Leonie Man, MD  diltiazem (CARDIZEM CD) 240 MG 24 hr capsule Take 240 mg by mouth daily.    Historical Provider, MD  docusate sodium (COLACE) 100 MG capsule Take 1 capsule (100 mg total) by mouth 2 (two) times daily as needed for constipation. 05/29/12   Cecilie Kicks, NP  furosemide (LASIX) 40 MG tablet Take 40 mg by mouth 2 (two) times daily.  03/30/13   Pixie Casino, MD  hydrALAZINE (APRESOLINE) 50 MG tablet Take 50 mg by mouth 2 (two) times daily.    Historical Provider, MD  insulin aspart protamine- aspart (NOVOLOG MIX 70/30) (70-30) 100 UNIT/ML injection Inject 20-65 Units into the skin every evening. 65 units in am (sliding scale) and 20 units in pm    Historical Provider, MD  levothyroxine (SYNTHROID, LEVOTHROID) 88 MCG tablet Take 88 mcg by mouth daily before breakfast.    Historical Provider, MD  metolazone (ZAROXOLYN) 5 MG tablet Take 1 tablet (5 mg total) by mouth daily. 12/01/13   Pixie Casino, MD  metoprolol (LOPRESSOR) 100 MG tablet Take 50 mg by mouth 2 (two) times daily.    Historical Provider, MD  naproxen sodium (ANAPROX) 220 MG tablet Take 220 mg by mouth daily as needed (for pain).    Historical Provider, MD  pantoprazole (PROTONIX) 40 MG tablet Take 40 mg by mouth daily.    Historical Provider, MD  polyethylene glycol (MIRALAX / GLYCOLAX) packet Take 17 g  by mouth daily as needed. For constipation    Historical Provider, MD  potassium chloride SA (K-DUR,KLOR-CON) 20 MEQ tablet Take 2 tablets (40 mEq total) by mouth 2 (two) times daily. 02/08/13   Pixie Casino, MD  traMADol (ULTRAM) 50 MG tablet Take 1 tablet (50 mg total) by mouth every 6 (six) hours as needed. 11/30/13   Pamella Pert, MD   BP 90/41  Pulse 114  Temp(Src) 97.2 F (36.2 C) (Oral)  Resp 22  SpO2 99% Physical Exam  Nursing note and vitals reviewed. Constitutional: He appears well-developed and well-nourished.  Laying in bed. Appears uncomfortable, but not toxic.   HENT:  Head: Normocephalic and atraumatic.  Eyes: Conjunctivae are normal. Right eye exhibits no discharge. Left eye exhibits no discharge.  Neck: Neck supple.  Cardiovascular: Normal heart sounds.  Exam reveals no gallop and no friction rub.   No murmur heard. Pacer L chest. Very tachy. irreg irreg.   Pulmonary/Chest: Effort normal and breath sounds normal. No respiratory distress.  Abdominal: Soft. He exhibits no distension. There is no tenderness.  Musculoskeletal: He exhibits no edema and no tenderness.  Symmetric pitting LE edema. Can actively range L shoulder although with increased pain. Not reproducible with palpation. No midline spinal tenderness.   Neurological: He is alert. No cranial nerve deficit. Coordination normal.  Able to tell me day of week and month, but not specific date. Follows commands but sometimes needs redirection. Seems more uncomfortable and difficulty focusing because of this than actually confused. CN intact. Mild global weakness. No focal motor deficit.   Skin: Skin is warm and dry.  Psychiatric: His behavior is normal. Thought content normal.    ED Course  Procedures (including critical care time)  CRITICAL CARE Performed by: Virgel Manifold  Total critical care time: 35 minutes  Critical care time was exclusive of separately billable procedures and treating other  patients. Critical care was necessary to treat or prevent imminent or life-threatening deterioration. Critical care was time spent personally by me on the following activities: development of treatment plan with patient and/or surrogate as well as nursing, discussions with consultants, evaluation of patient's response to treatment, examination of patient, obtaining history from patient or surrogate, ordering and performing treatments and interventions, ordering and review of laboratory studies, ordering and review of radiographic studies, pulse oximetry and re-evaluation of patient's condition. Labs Review Labs Reviewed  BASIC METABOLIC PANEL - Abnormal; Notable for the following:    Chloride 93 (*)    CO2 17 (*)    Glucose, Bld 180 (*)    BUN 128 (*)    Creatinine, Ser 4.56 (*)    GFR calc non Af Amer 11 (*)    GFR calc Af Amer 13 (*)    Anion gap 28 (*)    All other components within normal limits  CBC - Abnormal; Notable for the following:    WBC 14.8 (*)    RBC 3.12 (*)    Hemoglobin 9.9 (*)    HCT 29.8 (*)    Platelets 117 (*)    All other components within normal limits  PRO B NATRIURETIC PEPTIDE - Abnormal; Notable for the following:    Pro B Natriuretic peptide (BNP) 22168.0 (*)    All other components within normal limits  DIGOXIN LEVEL - Abnormal; Notable for the following:    Digoxin Level <0.3 (*)    All other components within normal limits  I-STAT TROPOININ, ED - Abnormal; Notable for the following:    Troponin i, poc 0.44 (*)    All other components within normal limits  I-STAT CG4 LACTIC ACID, ED - Abnormal; Notable for the following:    Lactic Acid, Venous 4.41 (*)    All other components within normal limits  URINE CULTURE  URINALYSIS, ROUTINE W REFLEX MICROSCOPIC  TROPONIN I  LIPASE, BLOOD  HEPATIC FUNCTION PANEL    Imaging Review Dg Chest Port 1 View  11/20/2013   CLINICAL DATA:  Chest pain.  EXAM: PORTABLE CHEST - 1 VIEW  COMPARISON:  None.  FINDINGS:  Mediastinum and hilar structures normal. Heart size normal. Previously noted with lead tips in the right atrium and right ventricle. No pleural effusion or pneumothorax. No acute bony abnormality. Old right fifth rib fracture noted .  IMPRESSION: 1. No acute cardiopulmonary disease. 2. Cardiac pacer and lead tips in right atrium and right ventricle. 3. Old right posterior fifth rib fracture.   Electronically Signed   By: Marcello Moores  Register   On: 11/18/2013 14:50     EKG Interpretation   Date/Time:  Thursday December 02 2013 14:28:57 EDT Ventricular Rate:  153 PR Interval:    QRS Duration: 124 QT Interval:  339 QTC Calculation: 541 R Axis:   -79 Text Interpretation:  atrial fibrillation with rapid ventricular response  Nonspecific IVCD with LAD No significant change since last tracing 11/29/13  Confirmed by Wilson Singer  MD, Leslyn Monda ([id number]) on 12/06/2013 5:34:43 PM      MDM   Final diagnoses:  Atrial fibrillation with rapid ventricular response  Acute on chronic renal insufficiency  Elevated troponin    78yM with pain "everywhere" but worse in L shoulder, upper back and neck. Imaging from last evaluation reviewed and w/o acute abnormality. Denies interim trauma.  Suspect symptoms from recent fall. In afib with RVR. Hx of PAF and rate uncontrolled . When talking with pt more in depth, he has actually not been taking all medicines like he should be even before this recent fall. Still not taking consistently since he left.  He generally does not feel well and looks very fatigued. Suspect he has not been rate controlled consistently for some time and contributing to how he feels. istat troponin is elevated, but he does not specifically endorse CP. ASA ordered. Heparin deferred at this time. Likely demand ischemia. Will send lab troponin. Given oral doses of home meds metoprolol/digoxin. Started on diltiazem gtt. Some peripheral edema, but unsure of chronicity. CXR without vascular congestion/edema. From  review of previous notes, dry weight appears to be 230 lbs. 217 lbs today. Worsening renal function. Lactic acid 4.4. Given 500cc NS by EMS. Continued IVF at rate of 100cc/hr for now. Consider infectious etiology, but I feel less likely. Does have leukocytosis, but this is nonspecific. Afebrile. UA pending.  Discussed with medicine for admission. Cardiology paged.     Virgel Manifold, MD 11/19/2013 [phone]

## 2013-12-02 NOTE — ED Notes (Signed)
Results reported to Dr Conley Canal via text page

## 2013-12-02 NOTE — ED Notes (Signed)
14 french foley cathter was inserted without difficulty.  Yellow cloudy urine returned.  Urine for UA, C&S sent

## 2013-12-03 LAB — CBC WITH DIFFERENTIAL/PLATELET
Basophils Absolute: 0 10*3/uL (ref 0.0–0.1)
Basophils Relative: 0 % (ref 0–1)
Eosinophils Absolute: 0 10*3/uL (ref 0.0–0.7)
Eosinophils Relative: 0 % (ref 0–5)
HCT: 27.6 % — ABNORMAL LOW (ref 39.0–52.0)
HEMOGLOBIN: 9.3 g/dL — AB (ref 13.0–17.0)
LYMPHS ABS: 0.5 10*3/uL — AB (ref 0.7–4.0)
LYMPHS PCT: 3 % — AB (ref 12–46)
MCH: 31.2 pg (ref 26.0–34.0)
MCHC: 33.7 g/dL (ref 30.0–36.0)
MCV: 92.6 fL (ref 78.0–100.0)
MONOS PCT: 4 % (ref 3–12)
Monocytes Absolute: 0.6 10*3/uL (ref 0.1–1.0)
NEUTROS ABS: 14.4 10*3/uL — AB (ref 1.7–7.7)
NEUTROS PCT: 93 % — AB (ref 43–77)
PLATELETS: 114 10*3/uL — AB (ref 150–400)
RBC: 2.98 MIL/uL — AB (ref 4.22–5.81)
RDW: 15.2 % (ref 11.5–15.5)
WBC: 15.6 10*3/uL — AB (ref 4.0–10.5)

## 2013-12-03 LAB — MRSA PCR SCREENING: MRSA BY PCR: NEGATIVE

## 2013-12-03 LAB — RENAL FUNCTION PANEL
ANION GAP: 23 — AB (ref 5–15)
Albumin: 2.3 g/dL — ABNORMAL LOW (ref 3.5–5.2)
BUN: 131 mg/dL — ABNORMAL HIGH (ref 6–23)
CHLORIDE: 95 meq/L — AB (ref 96–112)
CO2: 20 meq/L (ref 19–32)
Calcium: 9.5 mg/dL (ref 8.4–10.5)
Creatinine, Ser: 4.63 mg/dL — ABNORMAL HIGH (ref 0.50–1.35)
GFR, EST AFRICAN AMERICAN: 13 mL/min — AB (ref >90)
GFR, EST NON AFRICAN AMERICAN: 11 mL/min — AB (ref >90)
Glucose, Bld: 163 mg/dL — ABNORMAL HIGH (ref 70–99)
Phosphorus: 5.3 mg/dL — ABNORMAL HIGH (ref 2.3–4.6)
Potassium: 4.2 mEq/L (ref 3.7–5.3)
SODIUM: 138 meq/L (ref 137–147)

## 2013-12-03 LAB — LACTIC ACID, PLASMA: Lactic Acid, Venous: 2.8 mmol/L — ABNORMAL HIGH (ref 0.5–2.2)

## 2013-12-03 LAB — TROPONIN I
TROPONIN I: 0.44 ng/mL — AB (ref <0.30)
Troponin I: 0.49 ng/mL (ref <0.30)

## 2013-12-03 LAB — GLUCOSE, CAPILLARY
Glucose-Capillary: 141 mg/dL — ABNORMAL HIGH (ref 70–99)
Glucose-Capillary: 149 mg/dL — ABNORMAL HIGH (ref 70–99)

## 2013-12-03 MED ORDER — CETYLPYRIDINIUM CHLORIDE 0.05 % MT LIQD
7.0000 mL | Freq: Two times a day (BID) | OROMUCOSAL | Status: DC
  Administered 2013-12-03 – 2013-12-08 (×12): 7 mL via OROMUCOSAL

## 2013-12-03 MED ORDER — DILTIAZEM HCL ER COATED BEADS 180 MG PO CP24
180.0000 mg | ORAL_CAPSULE | Freq: Every day | ORAL | Status: DC
  Administered 2013-12-03 – 2013-12-05 (×3): 180 mg via ORAL
  Filled 2013-12-03 (×4): qty 1

## 2013-12-03 MED ORDER — VANCOMYCIN HCL IN DEXTROSE 1-5 GM/200ML-% IV SOLN
1000.0000 mg | INTRAVENOUS | Status: DC
Start: 2013-12-03 — End: 2013-12-04
  Administered 2013-12-03: 1000 mg via INTRAVENOUS
  Filled 2013-12-03: qty 200

## 2013-12-03 MED ORDER — IOHEXOL 300 MG/ML  SOLN
25.0000 mL | INTRAMUSCULAR | Status: AC
  Administered 2013-12-03: 25 mL via ORAL

## 2013-12-03 MED ORDER — INSULIN ASPART 100 UNIT/ML ~~LOC~~ SOLN
0.0000 [IU] | Freq: Three times a day (TID) | SUBCUTANEOUS | Status: DC
  Administered 2013-12-03 – 2013-12-04 (×4): 1 [IU] via SUBCUTANEOUS
  Administered 2013-12-05: 3 [IU] via SUBCUTANEOUS
  Administered 2013-12-05 (×2): 2 [IU] via SUBCUTANEOUS
  Administered 2013-12-06 (×2): 3 [IU] via SUBCUTANEOUS

## 2013-12-03 MED ORDER — INSULIN ASPART 100 UNIT/ML ~~LOC~~ SOLN: 0.0000 [IU] | SUBCUTANEOUS | Status: DC

## 2013-12-03 MED ORDER — DILTIAZEM HCL ER COATED BEADS 240 MG PO CP24: 240.0000 mg | ORAL_CAPSULE | Freq: Every day | ORAL | Status: DC

## 2013-12-03 MED ORDER — MORPHINE SULFATE 2 MG/ML IJ SOLN
2.0000 mg | INTRAMUSCULAR | Status: DC | PRN
  Administered 2013-12-03 – 2013-12-07 (×4): 2 mg via INTRAVENOUS
  Filled 2013-12-03 (×5): qty 1

## 2013-12-03 MED ORDER — LEVALBUTEROL HCL 0.63 MG/3ML IN NEBU: 0.6300 mg | INHALATION_SOLUTION | RESPIRATORY_TRACT | Status: DC | PRN

## 2013-12-03 NOTE — Progress Notes (Signed)
Unable to tolerate contrast for ct of the abd. Ct dept aware and MD aware, d/ced order.

## 2013-12-03 NOTE — Progress Notes (Signed)
Blood culture GM+ cocci in cluster, md made aware with order.

## 2013-12-03 NOTE — Progress Notes (Signed)
I have met and spoken with Mr. Hoopes' daughter - Elba Barman. [name] wishes to gather information from scans to better know his prostate cancer and what we are dealing with. She explains that they have been going through a lot with her mother and her health and she believes this has also impacted her father. She tells me that she definitely wants her father to be comfortable and not to suffer and realizes the importance of quality of life. However, she also does believe we should continue to treat her father and support him until they have more information and can know what to expect. I will continue to follow and support and plan to meet with family on Monday. Please call palliative phone [phone] with needs over the weekend.   Vinie Sill, NP Palliative Medicine Team Pager # [phone] (M-F 8a-5p) Team Phone # [phone] (Nights/Weekends)

## 2013-12-03 NOTE — Progress Notes (Signed)
ANTIBIOTIC CONSULT NOTE - INITIAL  Pharmacy Consult for Vancomycin Indication: Bacteremia  No Known Allergies  Patient Measurements: Height: 5\' 9"  (175.3 cm) Weight: 210 lb 15.7 oz (95.7 kg) IBW/kg (Calculated) : 70.7 Adjusted Body Weight:   Vital Signs: Temp: 98.3 F (36.8 C) (08/21 1154) Temp src: Oral (08/21 1154) BP: 103/42 mmHg (08/21 1200) Pulse Rate: 86 (08/21 1200) Intake/Output from previous day: 08/20 0701 - 08/21 0700 In: 1200 [I.V.:1200] Out: 185 [Urine:185] Intake/Output from this shift: Total I/O In: 1000 [P.O.:200; I.V.:800] Out: -   Labs:  Recent Labs  11/20/2013 1450 12/03/13 0425 12/03/13 0430  WBC 14.8*  --  15.6*  HGB 9.9*  --  9.3*  PLT 117*  --  114*  CREATININE 4.56* 4.63*  --    Estimated Creatinine Clearance: 15.5 ml/min (by C-G formula based on Cr of 4.63). No results found for this basename: VANCOTROUGH, Corlis Leak, VANCORANDOM, [place], GENTPEAK, GENTRANDOM, TOBRATROUGH, TOBRAPEAK, TOBRARND, AMIKACINPEAK, AMIKACINTROU, AMIKACIN,  in the last 72 hours   Microbiology: Recent Results (from the past 720 hour(s))  MRSA PCR SCREENING     Status: None   Collection Time    12/03/13  1:22 AM      Result Value Ref Range Status   MRSA by PCR NEGATIVE  NEGATIVE Final   Comment:            The GeneXpert MRSA Assay (FDA     approved for NASAL specimens     only), is one component of a     comprehensive MRSA colonization     surveillance program. It is not     intended to diagnose MRSA     infection nor to guide or     monitor treatment for     MRSA infections.  CULTURE, BLOOD (ROUTINE X 2)     Status: None   Collection Time    12/03/13  4:25 AM      Result Value Ref Range Status   Specimen Description BLOOD RIGHT ARM   Final   Special Requests BOTTLES DRAWN AEROBIC AND ANAEROBIC 10CC EACH   Final   Culture  Setup Time     Final   Value: 12/03/2013 08:29     Performed at [facility]   Culture     Final   Value: GRAM POSITIVE  COCCI IN PAIRS     Note: Gram Stain Report Called to,Read Back By and Verified With: CHAT B. AT 4:25PM ON 12/03/2013 HAJAM     Performed at [facility]   Report Status PENDING   Incomplete    Medical History: Past Medical History  Diagnosis Date  . Hypertension   . CHF (congestive heart failure)     EF 40-45% by Echo, combined Systolic & Diastolic  . Irregular heartbeat   . GERD (gastroesophageal reflux disease)   . Diastolic dysfunction, left ventricle 12/18/2011  . Atrial fibrillation with rapid ventricular response, history of PAF was in SR in 08/2011 12/18/2011  . Fecal impaction 04/13/2012  . Left atrial thrombus 12/2011    By TEE 12/2011  . CKD (chronic kidney disease) stage 3, GFR 30-59 ml/min 12/26/2011  . Pneumonia 03/31/2012; 04/13/2012    Healthcare-associated pneumonia/notes 04/13/2012  . PAF (paroxysmal atrial fibrillation) 04/15/2012    TEE in 12/2011 with LAA thrombus - no DCCV   . Type II diabetes mellitus   . Arthritis     SHOULDERS  . Symptomatic bradycardia 05/29/2012  . Tachycardia-bradycardia syndrome 05/29/2012  . Nonischemic cardiomyopathy  h/o   . Hypothyroid, recently diagnosed  01/26/2013  . Pacemaker   . Orthopnea   . Gout   . Prostate cancer     Medications:  Prescriptions prior to admission  Medication Sig Dispense Refill  . dabigatran (PRADAXA) 75 MG CAPS capsule Take 75 mg by mouth 2 (two) times daily.      . furosemide (LASIX) 40 MG tablet Take 40 mg by mouth 2 (two) times daily.       Marland Kitchen acetaminophen (TYLENOL) 325 MG tablet Take 650 mg by mouth every 6 (six) hours as needed for moderate pain.      Marland Kitchen allopurinol (ZYLOPRIM) 100 MG tablet Take 100 mg by mouth daily.       . digoxin (LANOXIN) 0.25 MG tablet Take 1 tablet every other day.  30 tablet  3  . diltiazem (CARDIZEM CD) 240 MG 24 hr capsule Take 240 mg by mouth daily.      Marland Kitchen docusate sodium (COLACE) 100 MG capsule Take 1 capsule (100 mg total) by mouth 2 (two) times daily as needed  for constipation.  30 capsule  0  . hydrALAZINE (APRESOLINE) 50 MG tablet Take 50 mg by mouth 2 (two) times daily.      . insulin aspart protamine- aspart (NOVOLOG MIX 70/30) (70-30) 100 UNIT/ML injection Inject 20-65 Units into the skin every evening. 65 units in am (sliding scale) and 20 units in pm      . levothyroxine (SYNTHROID, LEVOTHROID) 88 MCG tablet Take 88 mcg by mouth daily before breakfast.      . metolazone (ZAROXOLYN) 5 MG tablet Take 1 tablet (5 mg total) by mouth daily.  30 tablet  4  . metoprolol (LOPRESSOR) 100 MG tablet Take 50 mg by mouth 2 (two) times daily.      . naproxen sodium (ANAPROX) 220 MG tablet Take 220 mg by mouth daily as needed (for pain).      . pantoprazole (PROTONIX) 40 MG tablet Take 40 mg by mouth daily.      . polyethylene glycol (MIRALAX / GLYCOLAX) packet Take 17 g by mouth daily as needed. For constipation      . potassium chloride SA (K-DUR,KLOR-CON) 20 MEQ tablet Take 2 tablets (40 mEq total) by mouth 2 (two) times daily.  120 tablet  3  . traMADol (ULTRAM) 50 MG tablet Take 1 tablet (50 mg total) by mouth every 6 (six) hours as needed.  15 tablet  0   Assessment: Pain all over, weakness 78 y/o M presents with c/o weakness and pain. He has PMH of afib, CKD, HF, DM, and advanced prostate cancer. Had a recent fall, not taking meds, and not eating. Patient took Pradaxa PTA for h/o LA thrombus 2/14. Patient with ARF, metabolic acidosis, and thrombocytopenia. Now with [facility] in [place] x 1. Start Vancomycin. Afebrile. WBC 15.6. Scr 4.63 (CrCl 15.5). Minimal UOP  Goal of Therapy:  Vancomycin trough level 15-20 mcg/ml  Plan:  Vancomycin 1g IV q48h. Trough after 3-5 doses at steady state.  Eilene Ghazi Stillinger 12/03/2013,4:44 PM

## 2013-12-03 NOTE — Progress Notes (Signed)
INITIAL NUTRITION ASSESSMENT  DOCUMENTATION CODES Per approved criteria  -Not Applicable   INTERVENTION: Once diet upgraded, add Ensure Complete po TID, each supplement provides 350 kcal and 13 grams of protein  NUTRITION DIAGNOSIS: Inadequate oral intake related to failure to thrive as evidenced by wt loss and poor meal completion.   Goal: Pt to meet >/= 90% of their estimated nutrition needs   Monitor:  Weight trend, po intake, labs, acceptance of supplements  Reason for Assessment: MST  78 y.o. male  Admitting Dx: Atrial fibrillation with rapid ventricular response  ASSESSMENT: 78 y.o. male with h/o advanced prostate cancer, atrial fibrillation, CKD, systolic and diastolic heart failure, diabetes comes to ED with "pain all over" and weakness. Has not been taking medications for over a week, not eating.  - Pt lethargic in bed. Clear liquid tray was in room with very little intake. Pt appeared to eat only several bites. Pt nodded when asked if he liked "chocolate milk." Will send Ensure Complete supplements. Pt's weight has fluctuated, but it appears that he has lost some weight per chart history.  - Pt being followed by palliative care. Family wishes for pt to be comfortable, but has not chosen comfort measure at this time. Family palliative care meeting scheduled for Monday. - Pt with mild to moderate fat and muscle wasting of the temples.   Labs: Na and K WNL CBGs: 138-283  Height: Ht Readings from Last 1 Encounters:  12/04/2013 5\' 9"  (1.753 m)    Weight: Wt Readings from Last 1 Encounters:  12/03/13 210 lb 15.7 oz (95.7 kg)    Ideal Body Weight: 70.7 kg  % Ideal Body Weight: 135%  Wt Readings from Last 10 Encounters:  12/03/13 210 lb 15.7 oz (95.7 kg)  11/29/13 217 lb (98.431 kg)  08/27/13 214 lb 14.4 oz (97.478 kg)  07/09/13 221 lb 3.2 oz (100.336 kg)  04/23/13 211 lb 14.4 oz (96.117 kg)  04/14/13 218 lb 14.4 oz (99.292 kg)  03/30/13 228 lb 8 oz (103.647  kg)  02/22/13 211 lb (95.709 kg)  02/08/13 224 lb 8 oz (101.833 kg)  02/02/13 235 lb 10.8 oz (106.9 kg)    Usual Body Weight: unknown  % Usual Body Weight: n/a  BMI:  Body mass index is 31.14 kg/(m^2).  Estimated Nutritional Needs: Kcal: 2050-2300 Protein: 130-140 g Fluid: ~2.3 L/day  Skin: intact  Diet Order: Clear Liquid  EDUCATION NEEDS: -Education not appropriate at this time   Intake/Output Summary (Last 24 hours) at 12/03/13 1056 Last data filed at 12/03/13 0700  Gross per 24 hour  Intake   1200 ml  Output    185 ml  Net   1015 ml    Last BM: prior to admission   Labs:   Recent Labs Lab 11/29/13 2129 11/14/2013 1450 12/03/13 0425  NA 137 138 138  K 3.6* 4.0 4.2  CL 96 93* 95*  CO2 21 17* 20  BUN 98* 128* 131*  CREATININE 3.44* 4.56* 4.63*  CALCIUM 9.3 9.2 9.5  PHOS  --   --  5.3*  GLUCOSE 142* 180* 163*    CBG (last 3)  No results found for this basename: GLUCAP,  in the last 72 hours  Scheduled Meds: . antiseptic oral rinse  7 mL Mouth Rinse BID  . aspirin EC  81 mg Oral Daily  . digoxin  0.125 mg Oral QODAY  . diltiazem  180 mg Oral Daily  . heparin  5,000 Units Subcutaneous 3 times  per day  . levothyroxine  88 mcg Oral QAC breakfast  . sodium chloride  3 mL Intravenous Q12H    Continuous Infusions: . sodium chloride 100 mL/hr at 12/03/13 7096    Past Medical History  Diagnosis Date  . Hypertension   . CHF (congestive heart failure)     EF 40-45% by Echo, combined Systolic & Diastolic  . Irregular heartbeat   . GERD (gastroesophageal reflux disease)   . Diastolic dysfunction, left ventricle 12/18/2011  . Atrial fibrillation with rapid ventricular response, history of PAF was in SR in 08/2011 12/18/2011  . Fecal impaction 04/13/2012  . Left atrial thrombus 12/2011    By TEE 12/2011  . CKD (chronic kidney disease) stage 3, GFR 30-59 ml/min 12/26/2011  . Pneumonia 03/31/2012; 04/13/2012    Healthcare-associated pneumonia/notes 04/13/2012   . PAF (paroxysmal atrial fibrillation) 04/15/2012    TEE in 12/2011 with LAA thrombus - no DCCV   . Type II diabetes mellitus   . Arthritis     SHOULDERS  . Symptomatic bradycardia 05/29/2012  . Tachycardia-bradycardia syndrome 05/29/2012  . Nonischemic cardiomyopathy     h/o   . Hypothyroid, recently diagnosed  01/26/2013  . Pacemaker   . Orthopnea   . Gout   . Prostate cancer     Past Surgical History  Procedure Laterality Date  . Prostate surgery    . Tee without cardioversion  12/27/2011    Procedure: TRANSESOPHAGEAL ECHOCARDIOGRAM (TEE);  Surgeon: Pixie Casino, MD;  Location: [facility] OR;  Service: Cardiovascular;  Laterality: N/A;  MD request Main OR - EF 40-45%, mild conc LVH, systolic function mild-mod reduced; LA mod-severely dialted with uniobular appendage  . Cardioversion  12/27/2011    Procedure: CARDIOVERSION;  Surgeon: Pixie Casino, MD;  Location: [facility] OR;  Service: Cardiovascular;  Laterality: N/A;  . Coronary angioplasty with stent placement    . Cardioversion N/A 05/27/2012    Procedure: CARDIOVERSION;  Surgeon: Pixie Casino, MD;  Location: [facility];  Service: Cardiovascular;  Laterality: N/A;  . Pacemaker insertion  05/2011    [place], Model [id number], Serial [phone]  . Cardiac catheterization  05/28/2007    patent coronaries, EF 50%, mod pulm arterial htn (Dr. Melvern Banker)  . Insert / replace / remove pacemaker      Terrace Arabia RD, LDN

## 2013-12-03 NOTE — Care Management Note (Addendum)
    Page 1 of 1   12/07/2013     10:33:50 AM CARE MANAGEMENT NOTE 12/07/2013  Patient:  Vincent [name], Vincent [name]   Account Number:  [id number]  Date Initiated:  12/03/2013  Documentation initiated by:  Elissa Hefty  Subjective/Objective Assessment:   adm w at fib w rvr     Action/Plan:   lives w wife, pcp dr Naomie Dean   Anticipated DC Date:     Anticipated DC Plan:    In-house referral  Clinical Social Worker         Choice offered to / List presented to:             Status of service:   Medicare Important Message given?  YES (If response is "NO", the following Medicare IM given date fields will be blank) Date Medicare IM given:  12/06/2013 Medicare IM given by:  Elissa Hefty Date Additional Medicare IM given:   Additional Medicare IM given by:    Discharge Disposition:    Per UR Regulation:  Reviewed for med. necessity/level of care/duration of stay  If discussed at [place] of Stay Meetings, dates discussed:   12/07/2013    Comments:  8/25 1032a debbie Chala Gul rn.bsn son in law zach aleted me that pt will need snf. have made sw ref.

## 2013-12-03 NOTE — Progress Notes (Signed)
[place] TEAM 1 - Stepdown/ICU TEAM Progress Note  GAREN WOOLBRIGHT ZOX:[id number] DOB: [birth year] DOA: 12/12/2013 PCP: [name], MD  Admit HPI / Brief Narrative: 78 y.o. male with h/o advanced prostate cancer, atrial fibrillation, CKD, systolic and diastolic heart failure, diabetes who came to the ED with "pain all over" and weakness. He had not been taking medications for over a week, and not eating. He fell and was evaluated in ED several days prior to this admit. Since that time he had been largely immobile in bed.  In the ED he was found to be in atrial fibrillation with rate about 130. Blood pressure 90 systolic. Bun creatinine 128/4.5. Creatinine was 3.4 3 days prior. Baseline creatinine appears to be 1.7- 3.0. Troponin 0.4.   HPI/Subjective: Pt is alert but lethargic.  Speech is garbled and unintelligible.  Spoke w/ dgtr at bedside at length.    Assessment/Plan:  Atrial fibrillation with rapid ventricular response continue cardizem gtt as blood pressure tolerates - resume low dose digoxin qod - intravascular volume depletion contributing so continue volume resuscitation   Lactic acidosis  Improving w/ simple volume expansion  Prostate cancer, advanced Was scheduled to have CT chest abd pelvis for restaging but ?missed appointment - reorded for today but pt currently not able to consume oral contrast - last PSA 811  Chronic diastolic CHF + Non ischemic cardiomyopathy - EF 40-45% 2D Feb 2014 Appears [facility] at present   DM2  SSI - follow CBG  LA thrombus on echo Feb 2014  Chronically on pradaxa   Obstructive sleep apnea C-pap intol   Tachycardia-bradycardia syndrome, 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   Hypothyroid TSH normal but not at goal of 1.0 - avoid adjustment in regimen in setting of afib w/ RVR  Acute renal failure likely from rapid a fib, poor po intake, cardiorenal - likely not a candidate for dialysis due to multiple medical problems and advanced prostate  cancer - foley to assure no BOO  Protein-calorie malnutrition, severe   Elevated troponin no chest pain - ASA - likely related to renal failure and atrial fib/RVR - peaked at 0.53  Chronic anemia   Metabolic acidosis likely secondary to relative hypotension, a fib, etc   Thrombocytopenia, unspecified   Equivocal UA F/u urine cx  Code Status: FULL Family Communication: no family present at time of exam Disposition Plan: spoke w/ dgtr at bedside at length  Consultants: Palliative Care  Procedures: none  Antibiotics: none  DVT prophylaxis: SQ heparin   Objective: Blood pressure 103/42, pulse 86, temperature 98.3 F (36.8 C), temperature source Oral, resp. rate 33, height 5\' 9"  (1.753 m), weight 95.7 kg (210 lb 15.7 oz), SpO2 97.00%.  Intake/Output Summary (Last 24 hours) at 12/03/13 1545 Last data filed at 12/03/13 1500  Gross per 24 hour  Intake   2200 ml  Output    185 ml  Net   2015 ml   Exam: General: No acute respiratory distress at rest  Lungs: Clear to auscultation bilaterally without wheezes or crackles Cardiovascular: irreg irreg - no appreciable gallup, rub, or M - rate 100 bpm Abdomen: thin, nontender, nondistended, soft, bowel sounds positive, no rebound, no ascites, no appreciable mass Extremities: No significant cyanosis, clubbing;  trace edema bilateral lower extremities  Data Reviewed: Basic Metabolic Panel:  Recent Labs Lab 11/29/13 2129 11/16/2013 1450 12/03/13 0425  NA 137 138 138  K 3.6* 4.0 4.2  CL 96 93* 95*  CO2 21 17* 20  GLUCOSE  142* 180* 163*  BUN 98* 128* 131*  CREATININE 3.44* 4.56* 4.63*  CALCIUM 9.3 9.2 9.5  PHOS  --   --  5.3*   Liver Function Tests:  Recent Labs Lab 11/29/13 2129 11/18/2013 1703 12/03/13 0425  AST 30 45*  --   ALT 11 17  --   ALKPHOS 349* 257*  --   BILITOT 0.8 2.7*  --   PROT 7.7 7.1  --   ALBUMIN 2.7* 2.3* 2.3*    Recent Labs Lab 12/01/2013 1703  LIPASE 56    Recent Labs Lab  12/09/2013 2045  AMMONIA 17   Coags: No results found for this basename: PT, INR,  in the last 168 hours No results found for this basename: PTT,  in the last 168 hours  CBC:  Recent Labs Lab 11/29/13 2129 11/26/2013 1450 12/03/13 0430  WBC 11.4* 14.8* 15.6*  NEUTROABS 10.8*  --  14.4*  HGB 10.2* 9.9* 9.3*  HCT 30.9* 29.8* 27.6*  MCV 95.1 95.5 92.6  PLT 144* 117* 114*   Cardiac Enzymes:  Recent Labs Lab 12/06/2013 1618 11/22/2013 2045 12/03/13 0425 12/03/13 0915  TROPONINI 0.51* 0.53* 0.49* 0.44*   BNP (last 3 results)  Recent Labs  01/31/13 0100 02/02/13 0550 12/07/2013 1450  PROBNP 3302.0* 6692.0* 22168.0*   CBG: No results found for this basename: GLUCAP,  in the last 168 hours  Recent Results (from the past 240 hour(s))  MRSA PCR SCREENING     Status: None   Collection Time    12/03/13  1:22 AM      Result Value Ref Range Status   MRSA by PCR NEGATIVE  NEGATIVE Final   Comment:            The GeneXpert MRSA Assay (FDA     approved for NASAL specimens     only), is one component of a     comprehensive MRSA colonization     surveillance program. It is not     intended to diagnose MRSA     infection nor to guide or     monitor treatment for     MRSA infections.    Studies:  Recent x-ray studies have been reviewed in detail by the Attending Physician  Scheduled Meds:  Scheduled Meds: . antiseptic oral rinse  7 mL Mouth Rinse BID  . aspirin EC  81 mg Oral Daily  . digoxin  0.125 mg Oral QODAY  . diltiazem  180 mg Oral Daily  . heparin  5,000 Units Subcutaneous 3 times per day  . insulin aspart  0-9 Units Subcutaneous 6 times per day  . levothyroxine  88 mcg Oral QAC breakfast  . sodium chloride  3 mL Intravenous Q12H   Time spent on care of this patient: 35 mins  Savvas Roper T , MD   [facility] Office  [phone] Pager - Text Page per Shea Evans as per below:  On-Call/Text Page:      [email or web address]      password [email or web address]  If 7PM-7AM,  please contact night-coverage [email or web address] Password [email or web address] 12/03/2013, 3:45 PM   LOS: 1 day

## 2013-12-04 ENCOUNTER — Inpatient Hospital Stay (HOSPITAL_COMMUNITY): Payer: Medicare Other

## 2013-12-04 LAB — URINE CULTURE: Colony Count: >=100000

## 2013-12-04 LAB — HEMOGLOBIN A1C
Hgb A1c MFr Bld: 6.8 % — ABNORMAL HIGH (ref <5.7)
Mean Plasma Glucose: 148 mg/dL — ABNORMAL HIGH (ref <117)

## 2013-12-04 LAB — COMPREHENSIVE METABOLIC PANEL
ALT: 19 U/L (ref 0–53)
ANION GAP: 24 — AB (ref 5–15)
AST: 68 U/L — ABNORMAL HIGH (ref 0–37)
Albumin: 1.8 g/dL — ABNORMAL LOW (ref 3.5–5.2)
Alkaline Phosphatase: 355 U/L — ABNORMAL HIGH (ref 39–117)
BUN: 147 mg/dL — AB (ref 6–23)
CO2: 18 mEq/L — ABNORMAL LOW (ref 19–32)
Calcium: 9.1 mg/dL (ref 8.4–10.5)
Chloride: 98 mEq/L (ref 96–112)
Creatinine, Ser: 4.96 mg/dL — ABNORMAL HIGH (ref 0.50–1.35)
GFR calc non Af Amer: 10 mL/min — ABNORMAL LOW (ref >90)
GFR, EST AFRICAN AMERICAN: 12 mL/min — AB (ref >90)
Glucose, Bld: 139 mg/dL — ABNORMAL HIGH (ref 70–99)
Potassium: 4.6 mEq/L (ref 3.7–5.3)
Sodium: 140 mEq/L (ref 137–147)
TOTAL PROTEIN: 6.3 g/dL (ref 6.0–8.3)
Total Bilirubin: 4.4 mg/dL — ABNORMAL HIGH (ref 0.3–1.2)

## 2013-12-04 LAB — GLUCOSE, CAPILLARY
GLUCOSE-CAPILLARY: 145 mg/dL — AB (ref 70–99)
Glucose-Capillary: 127 mg/dL — ABNORMAL HIGH (ref 70–99)
Glucose-Capillary: 146 mg/dL — ABNORMAL HIGH (ref 70–99)
Glucose-Capillary: 146 mg/dL — ABNORMAL HIGH (ref 70–99)

## 2013-12-04 LAB — CBC
HCT: 24.3 % — ABNORMAL LOW (ref 39.0–52.0)
HEMOGLOBIN: 8.1 g/dL — AB (ref 13.0–17.0)
MCH: 30.7 pg (ref 26.0–34.0)
MCHC: 33.3 g/dL (ref 30.0–36.0)
MCV: 92 fL (ref 78.0–100.0)
PLATELETS: 93 10*3/uL — AB (ref 150–400)
RBC: 2.64 MIL/uL — AB (ref 4.22–5.81)
RDW: 15.2 % (ref 11.5–15.5)
WBC: 17.6 10*3/uL — ABNORMAL HIGH (ref 4.0–10.5)

## 2013-12-04 LAB — LACTIC ACID, PLASMA: LACTIC ACID, VENOUS: 2.9 mmol/L — AB (ref 0.5–2.2)

## 2013-12-04 LAB — PSA: PSA: 433.5 ng/mL — AB (ref <=4.00)

## 2013-12-04 MED ORDER — PENICILLIN G POTASSIUM 5000000 UNITS IJ SOLR
3.0000 10*6.[IU] | INTRAVENOUS | Status: DC
  Administered 2013-12-04 – 2013-12-08 (×23): 3 10*6.[IU] via INTRAVENOUS
  Filled 2013-12-04 (×28): qty 3

## 2013-12-04 MED ORDER — PENICILLIN G POTASSIUM 5000000 UNITS IJ SOLR
4.0000 10*6.[IU] | Freq: Three times a day (TID) | INTRAVENOUS | Status: AC
  Administered 2013-12-04: 4 10*6.[IU] via INTRAVENOUS
  Filled 2013-12-04 (×3): qty 4

## 2013-12-04 NOTE — Progress Notes (Deleted)
Admit Complaint:  Anticoagulation Infectious Disease Cardiovascular Endocrinology Gastrointestinal / Nutrition Neurology Nephrology Pulmonary Hematology / Oncology PTA Medication Issues Best Practices  

## 2013-12-04 NOTE — Progress Notes (Signed)
ANTIBIOTIC CONSULT NOTE - FOLLOW UP  Pharmacy Consult for Penicillin G Indication: Group B Strep pyelonephritis and bacteremia  No Known Allergies  Patient Measurements: Height: 5\' 9"  (175.3 cm) Weight: 212 lb 11.9 oz (96.5 kg) IBW/kg (Calculated) : 70.7  Vital Signs: Temp: 97.9 F (36.6 C) (08/22 1144) Temp src: Oral (08/22 1144) BP: 106/55 mmHg (08/22 1144) Pulse Rate: 128 (08/22 1144) Intake/Output from previous day: 08/21 0701 - 08/22 0700 In: 3250 [P.O.:250; I.V.:2300; IV Piggyback:200] Out: -  Intake/Output from this shift: Total I/O In: 550 [P.O.:50; I.V.:500] Out: -   Labs:  Recent Labs  11/22/2013 1450 12/03/13 0425 12/03/13 0430 12/04/13 0312  WBC 14.8*  --  15.6* 17.6*  HGB 9.9*  --  9.3* 8.1*  PLT 117*  --  114* 93*  CREATININE 4.56* 4.63*  --  4.96*   Estimated Creatinine Clearance: 14.5 ml/min (by C-G formula based on Cr of 4.96). No results found for this basename: Letta Median, VANCORANDOM, GENTTROUGH, GENTPEAK, GENTRANDOM, TOBRATROUGH, TOBRAPEAK, TOBRARND, AMIKACINPEAK, AMIKACINTROU, AMIKACIN,  in the last 72 hours   Microbiology: Recent Results (from the past 720 hour(s))  URINE CULTURE     Status: None   Collection Time    12/04/2013  6:24 PM      Result Value Ref Range Status   Specimen Description URINE, CATHETERIZED   Final   Special Requests NONE   Final   Culture  Setup Time     Final   Value: 12/13/2013 22:28     Performed at [facility]     Final   Value: >=100,000 COLONIES/ML     Performed at [facility]   Culture     Final   Value: GROUP B STREP(S.AGALACTIAE)ISOLATED     Note: TESTING AGAINST S. AGALACTIAE NOT ROUTINELY PERFORMED DUE TO PREDICTABILITY OF AMP/PEN/VAN SUSCEPTIBILITY.     Performed at [facility]   Report Status 12/04/2013 FINAL   Final  MRSA PCR SCREENING     Status: None   Collection Time    12/03/13  1:22 AM      Result Value Ref Range Status   MRSA by PCR  NEGATIVE  NEGATIVE Final   Comment:            The GeneXpert MRSA Assay (FDA     approved for NASAL specimens     only), is one component of a     comprehensive MRSA colonization     surveillance program. It is not     intended to diagnose MRSA     infection nor to guide or     monitor treatment for     MRSA infections.  CULTURE, BLOOD (ROUTINE X 2)     Status: None   Collection Time    12/03/13  4:13 AM      Result Value Ref Range Status   Specimen Description BLOOD LEFT FOREARM   Final   Special Requests BOTTLES DRAWN AEROBIC AND ANAEROBIC [facility] EACH   Final   Culture  Setup Time     Final   Value: 12/03/2013 08:29     Performed at [facility]   Culture     Final   Value: GROUP B STREP(S.AGALACTIAE)ISOLATED     Note: Gram Stain Report Called to,Read Back By and Verified With: MELVIN KUFSOUR ON 12/03/2013 AT 10:20P BY Dennard Nip     Performed at [facility]   Report Status PENDING   Incomplete  CULTURE,  BLOOD (ROUTINE X 2)     Status: None   Collection Time    12/03/13  4:25 AM      Result Value Ref Range Status   Specimen Description BLOOD RIGHT ARM   Final   Special Requests BOTTLES DRAWN AEROBIC AND ANAEROBIC 10CC EACH   Final   Culture  Setup Time     Final   Value: 12/03/2013 08:29     Performed at [facility]   Culture     Final   Value: GROUP B STREP(S.AGALACTIAE)ISOLATED     Note: Gram Stain Report Called to,Read Back By and Verified With: CHAT B. AT 4:25PM ON 12/03/2013 HAJAM     Performed at [facility]   Report Status PENDING   Incomplete    Anti-infectives   Start     Dose/Rate Route Frequency Ordered Stop   12/04/13 2000  penicillin G potassium 3 Million Units in dextrose 5 % 100 mL IVPB     3 Million Units 200 mL/hr over 30 Minutes Intravenous 6 times per day 12/04/13 1249     12/04/13 1400  penicillin G potassium 4 Million Units in dextrose 5 % 250 mL IVPB     4 Million Units 250 mL/hr over 60 Minutes Intravenous 3 times per  day 12/04/13 1145 12/04/13 2159   12/03/13 1800  vancomycin (VANCOCIN) IVPB 1000 mg/200 mL premix  Status:  Discontinued     1,000 mg 200 mL/hr over 60 Minutes Intravenous Every 48 hours 12/03/13 1653 12/04/13 1145      Assessment: 78 year old male to convert from Vancomycin to Penicillin G for Group B Strep pyelonephritis and bacteremia.   He has acute on chronic kidney disease, and his penicillin G will require adjustment.  As penicillin G exhibits time-dependent killing and has a short half-life it is pharmacokinetically more optimal to maintain the dosing interval as q4h and give a decreased dose for renal dysfunction.  A usual total daily dose for bacteremia would be 24 million units, so we will target to provide ~75% of that amount for him given his renal dysfunction.  Plan:  Penicillin G 3 million units q4h = 18 million units/day Follow renal function  Legrand Como, Pharm.D., BCPS, AAHIVP Clinical Pharmacist Phone: [phone] or [phone] 12/04/2013, 12:53 PM

## 2013-12-04 NOTE — Progress Notes (Signed)
[place] TEAM 1 - Stepdown/ICU TEAM Progress Note  Vincent [name] UYQ:[id number] DOB: Oct 26, 1935 DOA: 12/06/2013 PCP: [name], MD  Admit HPI / Brief Narrative: 78 y.o. male with h/o advanced prostate cancer, atrial fibrillation, CKD, systolic and diastolic heart failure, diabetes who came to the ED with "pain all over" and weakness. He had not been taking medications for over a week, and not eating. He fell and was evaluated in ED several days prior to this admit. Since that time he had been largely immobile in bed.  In the ED he was found to be in atrial fibrillation with rate about 130. Blood pressure 90 systolic. Bun creatinine 128/4.5. Creatinine was 3.4 3 days prior. Baseline creatinine appears to be 1.7- 3.0. Troponin 0.4.   HPI/Subjective: More alert today, but remains confused.  Denies focal pain.  Does not appear uncomfortable.      Assessment/Plan:  Atrial fibrillation with rapid ventricular response Stop digoxin in face of climbing crt - resumed oral CCB - rate reasonably controlled - intravascular volume depletion contributing so continue volume resuscitation   Acute renal failure likely pre-renal from rapid a fib, poor po intake, cardiorenal - likely not a candidate for dialysis due to multiple medical problems and advanced prostate cancer, though no acute indication for HD at preent - cont foley to assure no BOO - Is/Os not recorded for last 24hrs  - renal US since planned CT abdom was not able to be accomplished 8/21 - initiate bicarb IVF if bicarb drops furrther   Group B strep pyelo v/s prostatits w/ bacteremia (2/2 blood cx) D/c vanc and change to ampicillin   Metabolic acidosis - Lactic acidosis  likely secondary to relative hypotension, a fib, acute renal failure - persistent - cont volume expansion   Prostate cancer, advanced Was scheduled to have CT chest abd pelvis for restaging but ?missed appointment - reorded for 8/21 but pt not able to consume oral  contrast - last PSA 132 May 2015, now 434 - suspect pt is now end-stage - Palliative Care is following - reorder CTs if pt improves to point of tolerating contrast  Chronic diastolic CHF + Non ischemic cardiomyopathy - EF 40-45% 2D Feb 2014 Appears [facility] at present   DM2  SSI - follow CBG  LA thrombus on echo Feb 2014  Chronically on pradaxa - holding for now due to hematuria and worsening thrombocytopenia   Obstructive sleep apnea C-pap intol   Tachycardia-bradycardia syndrome, 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   Hypothyroid TSH normal but not at goal of 1.0 - avoid adjustment in regimen in setting of afib w/ RVR  Protein-calorie malnutrition, severe   Elevated troponin no chest pain - ASA - likely related to renal failure and atrial fib/RVR - peaked at 0.53  Chronic anemia  Follow Hgb w/ hematuria - no other evidence of acute blood loss   Thrombocytopenia, unspecified   Code Status: FULL Family Communication: no family present at time of exam today  Disposition Plan: SDU   Consultants: Palliative Care  Procedures: none  Antibiotics: Vanc 8/21 Pen G 8/22 >>  DVT prophylaxis: SCDs only due to hematuria and PLTs <100  Objective: Blood pressure 100/41, pulse 112, temperature 98 F (36.7 C), temperature source Oral, resp. rate 41, height 5\' 9"  (1.753 m), weight 96.5 kg (212 lb 11.9 oz), SpO2 100.00%.  Intake/Output Summary (Last 24 hours) at 12/04/13 1106 Last data filed at 12/04/13 1000  Gross per 24 hour  Intake   3100  ml  Output      0 ml  Net   3100 ml   Exam: General: No acute respiratory distress at rest  Lungs: Clear to auscultation bilaterally without wheezes or crackles Cardiovascular: irreg irreg - no appreciable gallup, rub, or M - rate 90 bpm Abdomen: thin, nontender, nondistended, soft, bowel sounds positive, no rebound, no ascites, no appreciable mass Extremities: No significant cyanosis, clubbing;  trace edema bilateral lower  extremities  Data Reviewed: Basic Metabolic Panel:  Recent Labs Lab 11/29/13 2129 12/05/2013 1450 12/03/13 0425 12/04/13 0312  NA 137 138 138 140  K 3.6* 4.0 4.2 4.6  CL 96 93* 95* 98  CO2 21 17* 20 18*  GLUCOSE 142* 180* 163* 139*  BUN 98* 128* 131* 147*  CREATININE 3.44* 4.56* 4.63* 4.96*  CALCIUM 9.3 9.2 9.5 9.1  PHOS  --   --  5.3*  --    Liver Function Tests:  Recent Labs Lab 11/29/13 2129 11/16/2013 1703 12/03/13 0425 12/04/13 0312  AST 30 45*  --  68*  ALT 11 17  --  19  ALKPHOS 349* 257*  --  355*  BILITOT 0.8 2.7*  --  4.4*  PROT 7.7 7.1  --  6.3  ALBUMIN 2.7* 2.3* 2.3* 1.8*    Recent Labs Lab 11/30/2013 1703  LIPASE 56    Recent Labs Lab 11/26/2013 2045  AMMONIA 17   Coags: No results found for this basename: PT, INR,  in the last 168 hours No results found for this basename: PTT,  in the last 168 hours  CBC:  Recent Labs Lab 11/29/13 2129 11/27/2013 1450 12/03/13 0430 12/04/13 0312  WBC 11.4* 14.8* 15.6* 17.6*  NEUTROABS 10.8*  --  14.4*  --   HGB 10.2* 9.9* 9.3* 8.1*  HCT 30.9* 29.8* 27.6* 24.3*  MCV 95.1 95.5 92.6 92.0  PLT 144* 117* 114* 93*   Cardiac Enzymes:  Recent Labs Lab 11/19/2013 1618 12/11/2013 2045 12/03/13 0425 12/03/13 0915  TROPONINI 0.51* 0.53* 0.49* 0.44*   BNP (last 3 results)  Recent Labs  01/31/13 0100 02/02/13 0550 12/06/2013 1450  PROBNP 3302.0* 6692.0* 22168.0*   CBG:  Recent Labs Lab 12/03/13 1654 12/03/13 2154 12/04/13 0738  GLUCAP 141* 149* 145*    Recent Results (from the past 240 hour(s))  URINE CULTURE     Status: None   Collection Time    11/28/2013  6:24 PM      Result Value Ref Range Status   Specimen Description URINE, CATHETERIZED   Final   Special Requests NONE   Final   Culture  Setup Time     Final   Value: 11/21/2013 22:28     Performed at [facility] Count     Final   Value: >=100,000 COLONIES/ML     Performed at [facility]   Culture     Final    Value: GROUP B STREP(S.AGALACTIAE)ISOLATED     Note: TESTING AGAINST S. AGALACTIAE NOT ROUTINELY PERFORMED DUE TO PREDICTABILITY OF AMP/PEN/VAN SUSCEPTIBILITY.     Performed at [facility]   Report Status 12/04/2013 FINAL   Final  MRSA PCR SCREENING     Status: None   Collection Time    12/03/13  1:22 AM      Result Value Ref Range Status   MRSA by PCR NEGATIVE  NEGATIVE Final   Comment:            The GeneXpert MRSA Assay (FDA  approved for NASAL specimens     only), is one component of a     comprehensive MRSA colonization     surveillance program. It is not     intended to diagnose MRSA     infection nor to guide or     monitor treatment for     MRSA infections.  CULTURE, BLOOD (ROUTINE X 2)     Status: None   Collection Time    12/03/13  4:13 AM      Result Value Ref Range Status   Specimen Description BLOOD LEFT FOREARM   Final   Special Requests BOTTLES DRAWN AEROBIC AND ANAEROBIC [facility] EACH   Final   Culture  Setup Time     Final   Value: 12/03/2013 08:29     Performed at [facility]   Culture     Final   Value: GROUP B STREP(S.AGALACTIAE)ISOLATED     Note: Gram Stain Report Called to,Read Back By and Verified With: MELVIN KUFSOUR ON 12/03/2013 AT 10:20P BY WILEJ     Performed at [facility]   Report Status PENDING   Incomplete  CULTURE, BLOOD (ROUTINE X 2)     Status: None   Collection Time    12/03/13  4:25 AM      Result Value Ref Range Status   Specimen Description BLOOD RIGHT ARM   Final   Special Requests BOTTLES DRAWN AEROBIC AND ANAEROBIC 10CC EACH   Final   Culture  Setup Time     Final   Value: 12/03/2013 08:29     Performed at [facility]   Culture     Final   Value: GROUP B STREP(S.AGALACTIAE)ISOLATED     Note: Gram Stain Report Called to,Read Back By and Verified With: CHAT B. AT 4:25PM ON 12/03/2013 HAJAM     Performed at [facility]   Report Status PENDING   Incomplete    Studies:  Recent x-ray  studies have been reviewed in detail by the Attending Physician  Scheduled Meds:  Scheduled Meds: . antiseptic oral rinse  7 mL Mouth Rinse BID  . aspirin EC  81 mg Oral Daily  . digoxin  0.125 mg Oral QODAY  . diltiazem  180 mg Oral Daily  . heparin  5,000 Units Subcutaneous 3 times per day  . insulin aspart  0-9 Units Subcutaneous TID WC  . levothyroxine  88 mcg Oral QAC breakfast  . sodium chloride  3 mL Intravenous Q12H  . vancomycin  1,000 mg Intravenous Q48H   Time spent on care of this patient: 35 mins  Carrigan Delafuente T , MD   [facility] Office  [phone] Pager - Text Page per Shea Evans as per below:  On-Call/Text Page:      [email or web address]      password [email or web address]  If 7PM-7AM, please contact night-coverage [email or web address] Password [email or web address] 12/04/2013, 11:06 AM   LOS: 2 days

## 2013-12-05 LAB — RENAL FUNCTION PANEL
Albumin: 1.7 g/dL — ABNORMAL LOW (ref 3.5–5.2)
Anion gap: 23 — ABNORMAL HIGH (ref 5–15)
BUN: 157 mg/dL — ABNORMAL HIGH (ref 6–23)
CALCIUM: 8.8 mg/dL (ref 8.4–10.5)
CO2: 18 mEq/L — ABNORMAL LOW (ref 19–32)
CREATININE: 4.92 mg/dL — AB (ref 0.50–1.35)
Chloride: 97 mEq/L (ref 96–112)
GFR calc non Af Amer: 10 mL/min — ABNORMAL LOW (ref >90)
GFR, EST AFRICAN AMERICAN: 12 mL/min — AB (ref >90)
GLUCOSE: 162 mg/dL — AB (ref 70–99)
PHOSPHORUS: 5.1 mg/dL — AB (ref 2.3–4.6)
Potassium: 4.4 mEq/L (ref 3.7–5.3)
SODIUM: 138 meq/L (ref 137–147)

## 2013-12-05 LAB — CULTURE, BLOOD (ROUTINE X 2)

## 2013-12-05 LAB — CBC
HEMATOCRIT: 24.2 % — AB (ref 39.0–52.0)
Hemoglobin: 8.3 g/dL — ABNORMAL LOW (ref 13.0–17.0)
MCH: 31 pg (ref 26.0–34.0)
MCHC: 34.3 g/dL (ref 30.0–36.0)
MCV: 90.3 fL (ref 78.0–100.0)
Platelets: 101 10*3/uL — ABNORMAL LOW (ref 150–400)
RBC: 2.68 MIL/uL — ABNORMAL LOW (ref 4.22–5.81)
RDW: 15.3 % (ref 11.5–15.5)
WBC: 19.9 10*3/uL — ABNORMAL HIGH (ref 4.0–10.5)

## 2013-12-05 LAB — GLUCOSE, CAPILLARY
GLUCOSE-CAPILLARY: 159 mg/dL — AB (ref 70–99)
Glucose-Capillary: 186 mg/dL — ABNORMAL HIGH (ref 70–99)
Glucose-Capillary: 204 mg/dL — ABNORMAL HIGH (ref 70–99)
Glucose-Capillary: 211 mg/dL — ABNORMAL HIGH (ref 70–99)

## 2013-12-05 MED ORDER — CETYLPYRIDINIUM CHLORIDE 0.05 % MT LIQD: 7.0000 mL | Freq: Two times a day (BID) | OROMUCOSAL | Status: DC

## 2013-12-05 NOTE — Progress Notes (Signed)
[place] TEAM 1 - Stepdown/ICU TEAM Progress Note  CORD WILCZYNSKI WGN:[id number] DOB: 1936-02-05 DOA: 12/06/2013 PCP: [name], MD  Admit HPI / Brief Narrative: 78 y.o. male with h/o advanced prostate cancer, atrial fibrillation, CKD, systolic and diastolic heart failure, and diabetes who came to the ED with "pain all over" and weakness. He had not been taking medications for over a week, and not eating. He fell and was evaluated in ED several days prior to this admit. Since that time he had been largely immobile in bed.  In the ED he was found to be in atrial fibrillation with a heart rate of 130. Blood pressure 90 systolic. Bun creatinine 128/4.5. Creatinine was 3.4 three days prior. Baseline creatinine appears to be 1.7- 3.0. Troponin 0.4.   HPI/Subjective: Pt is alert, but remains confused.  He is not able to provide a reliable hx.  He stops speaking in mid sentence when asked questions.    Assessment/Plan:  Atrial fibrillation with rapid ventricular response Stopped digoxin in face of climbing crt - resumed oral CCB - rate well controlled - continue w/ volume resuscitation   Acute renal failure likely pre-renal +/- ATN from rapid a fib, poor po intake, pyelonephritis w/ bacteremia, as well as a possible transient BOO due to prostate enlargement and penile edema - likely not a candidate for dialysis due to multiple medical problems and advanced prostate cancer, though no acute indication for HD at preent - cont foley to assure no BOO - Os not recorded since admission  - renal US notes moderate R hydro and possiblle mild L hydro (I suspect pt experienced some BOO prior to admit related to prostate CA progression) - crt appears to have plateaud - cont supportive care - no indication for bicarb as of yet   Toxic metabolic encephalopathy Due to systemic infection as well as uremia - tx each issue and follow   Group B strep pyelo v/s prostatits w/ bacteremia (2/2 blood cx) ampicillin    Metabolic acidosis - Lactic acidosis  secondary to relative hypotension and acute renal failure - persistent - cont volume expansion   Prostate cancer, advanced Was scheduled to have CT chest abd pelvis for restaging but ?missed appointment - reorded for 8/21 but pt not able to consume oral contrast - last PSA 132 May 2015, now 434 (though could be elevated acutely due to prostatitis) - suspect pt is now end-stage - Palliative Care is following - reorder CTs if pt improves to point of tolerating contrast  Chronic diastolic CHF + Non ischemic cardiomyopathy - EF 40-45% 2D Feb 2014 Appears [facility] at present   DM2  SSI - follow CBG  LA thrombus on echo Feb 2014  Chronically on pradaxa - holding for now due to hematuria and thrombocytopenia   Obstructive sleep apnea C-pap intol   Tachycardia-bradycardia syndrome, 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   Hypothyroid TSH normal but not at goal of 1.0 - avoid adjustment in setting of afib w/ RVR  Protein-calorie malnutrition, severe   Elevated troponin no chest pain - ASA - likely related to renal failure and atrial fib/RVR - peaked at 0.53  Chronic anemia  Follow Hgb w/ hematuria - no other evidence of acute blood loss   Thrombocytopenia, unspecified   Code Status: FULL Family Communication: no family present at time of exam today - called wife to update Disposition Plan: SDU   Consultants: Palliative Care   Procedures: none  Antibiotics: Vanc 8/21 Pen G 8/22 >>  DVT prophylaxis: SCDs only due to hematuria and low PLTs  Objective: Blood pressure 126/37, pulse 98, temperature 97.8 F (36.6 C), temperature source Oral, resp. rate 20, height 5\' 9"  (1.753 m), weight 96.5 kg (212 lb 11.9 oz), SpO2 100.00%.  Intake/Output Summary (Last 24 hours) at 12/05/13 1761 Last data filed at 12/05/13 0700  Gross per 24 hour  Intake   3000 ml  Output      0 ml  Net   3000 ml   Exam: General: No acute respiratory distress at rest   Lungs: Clear to auscultation bilaterally without wheezes or crackles Cardiovascular: irreg irreg - no appreciable gallup, rub, or M - rate 84 bpm Abdomen: thin, nontender, nondistended, soft, bowel sounds positive, no rebound, no ascites, no appreciable mass Extremities: No significant cyanosis, clubbing;  trace edema bilateral lower extremities GU: edema of penis w/ foley in place - tea colored urine draining into foley bag (less bloody than yesterday)  Data Reviewed: Basic Metabolic Panel:  Recent Labs Lab 11/29/13 2129 11/29/2013 1450 12/03/13 0425 12/04/13 0312 12/05/13 0320  NA 137 138 138 140 138  K 3.6* 4.0 4.2 4.6 4.4  CL 96 93* 95* 98 97  CO2 21 17* 20 18* 18*  GLUCOSE 142* 180* 163* 139* 162*  BUN 98* 128* 131* 147* 157*  CREATININE 3.44* 4.56* 4.63* 4.96* 4.92*  CALCIUM 9.3 9.2 9.5 9.1 8.8  PHOS  --   --  5.3*  --  5.1*   Liver Function Tests:  Recent Labs Lab 11/29/13 2129 11/14/2013 1703 12/03/13 0425 12/04/13 0312 12/05/13 0320  AST 30 45*  --  68*  --   ALT 11 17  --  19  --   ALKPHOS 349* 257*  --  355*  --   BILITOT 0.8 2.7*  --  4.4*  --   PROT 7.7 7.1  --  6.3  --   ALBUMIN 2.7* 2.3* 2.3* 1.8* 1.7*    Recent Labs Lab 11/27/2013 1703  LIPASE 56    Recent Labs Lab 11/13/2013 2045  AMMONIA 17   Coags: No results found for this basename: PT, INR,  in the last 168 hours No results found for this basename: PTT,  in the last 168 hours  CBC:  Recent Labs Lab 11/29/13 2129 11/25/2013 1450 12/03/13 0430 12/04/13 0312 12/05/13 0330  WBC 11.4* 14.8* 15.6* 17.6* 19.9*  NEUTROABS 10.8*  --  14.4*  --   --   HGB 10.2* 9.9* 9.3* 8.1* 8.3*  HCT 30.9* 29.8* 27.6* 24.3* 24.2*  MCV 95.1 95.5 92.6 92.0 90.3  PLT 144* 117* 114* 93* 101*   Cardiac Enzymes:  Recent Labs Lab 11/18/2013 1618 11/28/2013 2045 12/03/13 0425 12/03/13 0915  TROPONINI 0.51* 0.53* 0.49* 0.44*   BNP (last 3 results)  Recent Labs  01/31/13 0100 02/02/13 0550 12/13/2013 1450   PROBNP 3302.0* 6692.0* 22168.0*   CBG:  Recent Labs Lab 12/04/13 0738 12/04/13 1137 12/04/13 1711 12/04/13 2134 12/05/13 0855  GLUCAP 145* 127* 146* 146* 159*    Recent Results (from the past 240 hour(s))  URINE CULTURE     Status: None   Collection Time    11/21/2013  6:24 PM      Result Value Ref Range Status   Specimen Description URINE, CATHETERIZED   Final   Special Requests NONE   Final   Culture  Setup Time     Final   Value: 11/25/2013 22:28     Performed at [facility]  [place] Count     Final   Value: >=100,000 COLONIES/ML     Performed at [facility]   Culture     Final   Value: GROUP B STREP(S.AGALACTIAE)ISOLATED     Note: TESTING AGAINST S. AGALACTIAE NOT ROUTINELY PERFORMED DUE TO PREDICTABILITY OF AMP/PEN/VAN SUSCEPTIBILITY.     Performed at [facility]   Report Status 12/04/2013 FINAL   Final  MRSA PCR SCREENING     Status: None   Collection Time    12/03/13  1:22 AM      Result Value Ref Range Status   MRSA by PCR NEGATIVE  NEGATIVE Final   Comment:            The GeneXpert MRSA Assay (FDA     approved for NASAL specimens     only), is one component of a     comprehensive MRSA colonization     surveillance program. It is not     intended to diagnose MRSA     infection nor to guide or     monitor treatment for     MRSA infections.  CULTURE, BLOOD (ROUTINE X 2)     Status: None   Collection Time    12/03/13  4:13 AM      Result Value Ref Range Status   Specimen Description BLOOD LEFT FOREARM   Final   Special Requests BOTTLES DRAWN AEROBIC AND ANAEROBIC [facility] EACH   Final   Culture  Setup Time     Final   Value: 12/03/2013 08:29     Performed at [facility]   Culture     Final   Value: GROUP B STREP(S.AGALACTIAE)ISOLATED     Note: Gram Stain Report Called to,Read Back By and Verified With: MELVIN KUFSOUR ON 12/03/2013 AT 10:20P BY WILEJ     Performed at [facility]   Report Status 12/05/2013 FINAL    Final   Organism ID, Bacteria GROUP B STREP(S.AGALACTIAE)ISOLATED   Final  CULTURE, BLOOD (ROUTINE X 2)     Status: None   Collection Time    12/03/13  4:25 AM      Result Value Ref Range Status   Specimen Description BLOOD RIGHT ARM   Final   Special Requests BOTTLES DRAWN AEROBIC AND ANAEROBIC 10CC EACH   Final   Culture  Setup Time     Final   Value: 12/03/2013 08:29     Performed at [facility]   Culture     Final   Value: GROUP B STREP(S.AGALACTIAE)ISOLATED     Note: SUSCEPTIBILITIES PERFORMED ON PREVIOUS CULTURE WITHIN THE LAST 5 DAYS.     Note: Gram Stain Report Called to,Read Back By and Verified With: CHAT B. AT 4:25PM ON 12/03/2013 HAJAM     Performed at [facility]   Report Status 12/05/2013 FINAL   Final    Studies:  Recent x-ray studies have been reviewed in detail by the Attending Physician  Scheduled Meds:  Scheduled Meds: . antiseptic oral rinse  7 mL Mouth Rinse BID  . diltiazem  180 mg Oral Daily  . insulin aspart  0-9 Units Subcutaneous TID WC  . levothyroxine  88 mcg Oral QAC breakfast  . pencillin G potassium IV  3 Million Units Intravenous 6 times per day  . sodium chloride  3 mL Intravenous Q12H   Time spent on care of this patient: 35 mins  Chantry Headen T , MD   [facility] Office  [phone] Pager - Text Page per Shea Evans as per below:  On-Call/Text Page:      [email or web address]      password [email or web address]  If 7PM-7AM, please contact night-coverage [email or web address] Password [facility] 12/05/2013, 9:39 AM   LOS: 3 days

## 2013-12-06 ENCOUNTER — Inpatient Hospital Stay (HOSPITAL_COMMUNITY): Payer: Medicare Other

## 2013-12-06 ENCOUNTER — Encounter: Payer: Medicare Other | Admitting: Cardiovascular Disease

## 2013-12-06 LAB — COMPREHENSIVE METABOLIC PANEL
ALBUMIN: 1.6 g/dL — AB (ref 3.5–5.2)
ALK PHOS: 312 U/L — AB (ref 39–117)
ALT: 18 U/L (ref 0–53)
AST: 80 U/L — AB (ref 0–37)
Anion gap: 23 — ABNORMAL HIGH (ref 5–15)
BILIRUBIN TOTAL: 5.9 mg/dL — AB (ref 0.3–1.2)
BUN: 164 mg/dL — AB (ref 6–23)
CHLORIDE: 100 meq/L (ref 96–112)
CO2: 17 mEq/L — ABNORMAL LOW (ref 19–32)
Calcium: 9.1 mg/dL (ref 8.4–10.5)
Creatinine, Ser: 4.85 mg/dL — ABNORMAL HIGH (ref 0.50–1.35)
GFR calc Af Amer: 12 mL/min — ABNORMAL LOW (ref >90)
GFR calc non Af Amer: 11 mL/min — ABNORMAL LOW (ref >90)
Glucose, Bld: 194 mg/dL — ABNORMAL HIGH (ref 70–99)
POTASSIUM: 4.7 meq/L (ref 3.7–5.3)
SODIUM: 140 meq/L (ref 137–147)
Total Protein: 6.3 g/dL (ref 6.0–8.3)

## 2013-12-06 LAB — CBC
HCT: 23.6 % — ABNORMAL LOW (ref 39.0–52.0)
HEMOGLOBIN: 8.4 g/dL — AB (ref 13.0–17.0)
MCH: 31.7 pg (ref 26.0–34.0)
MCHC: 35.6 g/dL (ref 30.0–36.0)
MCV: 89.1 fL (ref 78.0–100.0)
Platelets: 102 10*3/uL — ABNORMAL LOW (ref 150–400)
RBC: 2.65 MIL/uL — ABNORMAL LOW (ref 4.22–5.81)
RDW: 15.5 % (ref 11.5–15.5)
WBC: 25.4 10*3/uL — ABNORMAL HIGH (ref 4.0–10.5)

## 2013-12-06 LAB — GLUCOSE, CAPILLARY
GLUCOSE-CAPILLARY: 214 mg/dL — AB (ref 70–99)
Glucose-Capillary: 208 mg/dL — ABNORMAL HIGH (ref 70–99)
Glucose-Capillary: 218 mg/dL — ABNORMAL HIGH (ref 70–99)
Glucose-Capillary: 240 mg/dL — ABNORMAL HIGH (ref 70–99)
Glucose-Capillary: 247 mg/dL — ABNORMAL HIGH (ref 70–99)

## 2013-12-06 MED ORDER — LEVOTHYROXINE SODIUM 100 MCG IV SOLR
44.0000 ug | INTRAVENOUS | Status: AC
  Administered 2013-12-06 – 2013-12-07 (×2): 44 ug via INTRAVENOUS
  Filled 2013-12-06 (×2): qty 5

## 2013-12-06 MED ORDER — METOPROLOL TARTRATE 1 MG/ML IV SOLN
5.0000 mg | Freq: Four times a day (QID) | INTRAVENOUS | Status: DC
  Administered 2013-12-06 – 2013-12-08 (×8): 5 mg via INTRAVENOUS
  Administered 2013-12-08: 2 mg via INTRAVENOUS
  Administered 2013-12-08 (×2): 5 mg via INTRAVENOUS
  Filled 2013-12-06 (×12): qty 5

## 2013-12-06 MED ORDER — PANTOPRAZOLE SODIUM 40 MG IV SOLR
40.0000 mg | INTRAVENOUS | Status: DC
  Administered 2013-12-06 – 2013-12-07 (×2): 40 mg via INTRAVENOUS
  Filled 2013-12-06 (×3): qty 40

## 2013-12-06 NOTE — Progress Notes (Signed)
Inpatient Diabetes Program Recommendations  AACE/ADA: New Consensus Statement on Inpatient Glycemic Control (2013)  Target Ranges:  Prepandial:   less than 140 mg/dL      Peak postprandial:   less than 180 mg/dL (1-2 hours)      Critically ill patients:  140 - 180 mg/dL   Inpatient Diabetes Program Recommendations Insulin - Basal: consider adding low dose basal Lantus or Levemir  Patient takes premix 70/30 at home. Thank you  Raoul Pitch BSN, RN,CDE Inpatient Diabetes Coordinator [phone] (team pager)

## 2013-12-06 NOTE — Progress Notes (Signed)
Md made aware of increase bloody urine.  Vs stable.  Will continue to monitor. [name] T

## 2013-12-06 NOTE — Consult Note (Signed)
Reason for Consult: AKI on CKD stage 4 Referring Physician: Joette Catching MD [facility])  HPI: (The patient is nonverbal at the time of visit and history is obtained from his daughter-[name] (HCPOA)/ from the chart  78 year old African American man with past medical history significant for chronic kidney disease stage 3-4 at baseline (creatinine ranging 1.7-3.0), hypertension, atrial fibrillation, systolic/diastolic heart failure, diabetes mellitus and advanced prostate cancer that is hormone insensitive who was admitted to the hospital 4 days ago with weakness, deconditioning and complaints of "pain all over". Apparently was comparatively functional and verbal over a week ago during a visit to the beach however, upon returning was noted to have had progressive decline of his oral intake, poor compliance with medications and suffered a fall that prompted his visit to the emergency room. He was supposedly on furosemide, metolazone and naproxen prior to admission. On admission was found to be in acute renal failure, with A. fib with RVR and hypotension. Further investigation revealed moderate right-sided hydronephrosis with mild left-sided hydronephrosis (unclear chronicity as past images cannot be accessed).  His labs have trended as follows:  08/27/2013  11/29/2013  12/03/2013  12/03/2013  12/04/2013  12/05/2013  12/06/2013   BUN 52.5 (H) 98 (H) 128 (H) 131 (H) 147 (H) 157 (H) 164 (H)  Creatinine 3.0 (HH) 3.44 (H) 4.56 (H) 4.63 (H) 4.96 (H) 4.92 (H) 4.85 (H)   His mental status has continued to worsen and the patient has become progressively nonverbal to this hospitalization. A question is raised as to the utility of dialysis for supportive management.  In the past, the patient has voiced to his daughter that he would not want to do chronic dialysis.  Past Medical History  Diagnosis Date  . Hypertension   . CHF (congestive heart failure)     EF 40-45% by Echo, combined Systolic & Diastolic  . Irregular  heartbeat   . GERD (gastroesophageal reflux disease)   . Diastolic dysfunction, left ventricle 12/18/2011  . Atrial fibrillation with rapid ventricular response, history of PAF was in SR in 08/2011 12/18/2011  . Fecal impaction 04/13/2012  . Left atrial thrombus 12/2011    By TEE 12/2011  . CKD (chronic kidney disease) stage 3, GFR 30-59 ml/min 12/26/2011  . Pneumonia 03/31/2012; 04/13/2012    Healthcare-associated pneumonia/notes 04/13/2012  . PAF (paroxysmal atrial fibrillation) 04/15/2012    TEE in 12/2011 with LAA thrombus - no DCCV   . Type II diabetes mellitus   . Arthritis     SHOULDERS  . Symptomatic bradycardia 05/29/2012  . Tachycardia-bradycardia syndrome 05/29/2012  . Nonischemic cardiomyopathy     h/o   . Hypothyroid, recently diagnosed  01/26/2013  . Pacemaker   . Orthopnea   . Gout   . Prostate cancer     Past Surgical History  Procedure Laterality Date  . Prostate surgery    . Tee without cardioversion  12/27/2011    Procedure: TRANSESOPHAGEAL ECHOCARDIOGRAM (TEE);  Surgeon: Pixie Casino, MD;  Location: [facility] OR;  Service: Cardiovascular;  Laterality: N/A;  MD request Main OR - EF 40-45%, mild conc LVH, systolic function mild-mod reduced; LA mod-severely dialted with uniobular appendage  . Cardioversion  12/27/2011    Procedure: CARDIOVERSION;  Surgeon: Pixie Casino, MD;  Location: [facility] OR;  Service: Cardiovascular;  Laterality: N/A;  . Coronary angioplasty with stent placement    . Cardioversion N/A 05/27/2012    Procedure: CARDIOVERSION;  Surgeon: Pixie Casino, MD;  Location: [facility];  Service: Cardiovascular;  Laterality: N/A;  . Pacemaker insertion  05/2011    [place], [place] #[id number], Serial [id number]  . Cardiac catheterization  05/28/2007    patent coronaries, EF 50%, mod pulm arterial htn (Dr. Melvern Banker)  . Insert / replace / remove pacemaker      Family History  Problem Relation Age of Onset  . Diabetes Mellitus II Mother   . Sudden death Mother   .  Prostate cancer Father     Social History:  reports that he quit smoking about 33 years ago. His smoking use included Cigarettes. He has a 25 pack-year smoking history. He has never used smokeless tobacco. He reports that he drinks alcohol. He reports that he does not use illicit drugs.  Allergies: No Known Allergies  Medications:  Scheduled: . antiseptic oral rinse  7 mL Mouth Rinse BID  . insulin aspart  0-9 Units Subcutaneous TID WC  . pencillin G potassium IV  3 Million Units Intravenous 6 times per day  . sodium chloride  3 mL Intravenous Q12H    Results for orders placed during the hospital encounter of 12/09/2013 (from the past 48 hour(s))  GLUCOSE, CAPILLARY     Status: Abnormal   Collection Time    12/04/13  5:11 PM      Result Value Ref Range   Glucose-Capillary 146 (*) 70 - 99 mg/dL  GLUCOSE, CAPILLARY     Status: Abnormal   Collection Time    12/04/13  9:34 PM      Result Value Ref Range   Glucose-Capillary 146 (*) 70 - 99 mg/dL  RENAL FUNCTION PANEL     Status: Abnormal   Collection Time    12/05/13  3:20 AM      Result Value Ref Range   Sodium 138  137 - 147 mEq/L   Potassium 4.4  3.7 - 5.3 mEq/L   Chloride 97  96 - 112 mEq/L   CO2 18 (*) 19 - 32 mEq/L   Glucose, Bld 162 (*) 70 - 99 mg/dL   BUN 157 (*) 6 - 23 mg/dL   Creatinine, Ser 4.92 (*) 0.50 - 1.35 mg/dL   Calcium 8.8  8.4 - 10.5 mg/dL   Phosphorus 5.1 (*) 2.3 - 4.6 mg/dL   Albumin 1.7 (*) 3.5 - 5.2 g/dL   GFR calc non Af Amer 10 (*) >90 mL/min   GFR calc Af Amer 12 (*) >90 mL/min   Comment: (NOTE)     The eGFR has been calculated using the CKD EPI equation.     This calculation has not been validated in all clinical situations.     eGFR's persistently <90 mL/min signify possible Chronic Kidney     Disease.   Anion gap 23 (*) 5 - 15  CBC     Status: Abnormal   Collection Time    12/05/13  3:30 AM      Result Value Ref Range   WBC 19.9 (*) 4.0 - 10.5 K/uL   RBC 2.68 (*) 4.22 - 5.81 MIL/uL    Hemoglobin 8.3 (*) 13.0 - 17.0 g/dL   HCT 24.2 (*) 39.0 - 52.0 %   MCV 90.3  78.0 - 100.0 fL   MCH 31.0  26.0 - 34.0 pg   MCHC 34.3  30.0 - 36.0 g/dL   RDW 15.3  11.5 - 15.5 %   Platelets 101 (*) 150 - 400 K/uL   Comment: CONSISTENT WITH PREVIOUS RESULT  GLUCOSE, CAPILLARY     Status: Abnormal  Collection Time    12/05/13  8:55 AM      Result Value Ref Range   Glucose-Capillary 159 (*) 70 - 99 mg/dL  GLUCOSE, CAPILLARY     Status: Abnormal   Collection Time    12/05/13 11:59 AM      Result Value Ref Range   Glucose-Capillary 186 (*) 70 - 99 mg/dL  GLUCOSE, CAPILLARY     Status: Abnormal   Collection Time    12/05/13  5:34 PM      Result Value Ref Range   Glucose-Capillary 204 (*) 70 - 99 mg/dL  GLUCOSE, CAPILLARY     Status: Abnormal   Collection Time    12/05/13  9:27 PM      Result Value Ref Range   Glucose-Capillary 211 (*) 70 - 99 mg/dL  COMPREHENSIVE METABOLIC PANEL     Status: Abnormal   Collection Time    12/06/13  2:56 AM      Result Value Ref Range   Sodium 140  137 - 147 mEq/L   Potassium 4.7  3.7 - 5.3 mEq/L   Chloride 100  96 - 112 mEq/L   CO2 17 (*) 19 - 32 mEq/L   Glucose, Bld 194 (*) 70 - 99 mg/dL   BUN 164 (*) 6 - 23 mg/dL   Creatinine, Ser 4.85 (*) 0.50 - 1.35 mg/dL   Calcium 9.1  8.4 - 10.5 mg/dL   Total Protein 6.3  6.0 - 8.3 g/dL   Albumin 1.6 (*) 3.5 - 5.2 g/dL   AST 80 (*) 0 - 37 U/L   ALT 18  0 - 53 U/L   Alkaline Phosphatase 312 (*) 39 - 117 U/L   Total Bilirubin 5.9 (*) 0.3 - 1.2 mg/dL   GFR calc non Af Amer 11 (*) >90 mL/min   GFR calc Af Amer 12 (*) >90 mL/min   Comment: (NOTE)     The eGFR has been calculated using the CKD EPI equation.     This calculation has not been validated in all clinical situations.     eGFR's persistently <90 mL/min signify possible Chronic Kidney     Disease.   Anion gap 23 (*) 5 - 15  CBC     Status: Abnormal   Collection Time    12/06/13  2:56 AM      Result Value Ref Range   WBC 25.4 (*) 4.0 - 10.5  K/uL   RBC 2.65 (*) 4.22 - 5.81 MIL/uL   Hemoglobin 8.4 (*) 13.0 - 17.0 g/dL   HCT 23.6 (*) 39.0 - 52.0 %   MCV 89.1  78.0 - 100.0 fL   MCH 31.7  26.0 - 34.0 pg   MCHC 35.6  30.0 - 36.0 g/dL   RDW 15.5  11.5 - 15.5 %   Platelets 102 (*) 150 - 400 K/uL   Comment: CONSISTENT WITH PREVIOUS RESULT  GLUCOSE, CAPILLARY     Status: Abnormal   Collection Time    12/06/13  8:05 AM      Result Value Ref Range   Glucose-Capillary 214 (*) 70 - 99 mg/dL    US Renal  12/04/2013   CLINICAL DATA:  Chronic renal failure, now oliguric; history of prostate malignancy; possible obstruction  EXAM: RENAL/URINARY TRACT ULTRASOUND COMPLETE  COMPARISON:  None.  FINDINGS: Right Kidney:  Length: 11.6 cm. There is moderate hydronephrosis. Echogenicity within normal limits.  Left Kidney:  Length: 10.7 cm. Visualization is limited. Mild hydronephrosis may be present but  is difficult to clearly demonstrate.  Bladder:  The bladder is apparently decompressed with a Foley catheter.  IMPRESSION: There is moderate right-sided hydronephrosis. There may be mild hydronephrosis on the left. Further evaluation with noncontrast abdominal/pelvic CT scanning is recommended.   Electronically Signed   By: David  Martinique   On: 12/04/2013 13:42    Review of Systems  Unable to perform ROS: medical condition   Blood pressure 127/37, pulse 96, temperature 98.1 F (36.7 C), temperature source Oral, resp. rate 24, height $RemoveBe'5\' 9"'XSmlSghgu$  (1.753 m), weight 100.3 kg (221 lb 1.9 oz), SpO2 100.00%. Physical Exam  Nursing note and vitals reviewed. Constitutional: He appears well-developed.  HENT:  Head: Normocephalic and atraumatic.  Nose: Nose normal.  Dry oropharynx/oral mucosa  Eyes: Conjunctivae are normal. Pupils are equal, round, and reactive to light. Left eye exhibits no discharge.  Neck: Normal range of motion. Neck supple. No JVD present. No tracheal deviation present. No thyromegaly present.  Cardiovascular: Normal heart sounds.  Exam  reveals no friction rub.   No murmur heard. Irregularly irregular  Respiratory: Effort normal and breath sounds normal. He has no wheezes. He has no rales.  GI: Soft. Bowel sounds are normal. He exhibits no distension. There is tenderness. There is no rebound.  Global tenderness  Musculoskeletal: He exhibits edema.  Trace LE(ankle) edema  Neurological:  Fine tremor noted with intermittent fasciculations of face Turns head to voice but non-verbal to questions  Skin: Skin is warm and dry. No rash noted. No erythema.    Assessment/Plan: 1. Acute renal failure on chronic kidney disease stage III-IV: This appears to have been possibly hemodynamically mediated with decreasing oral intake (suspected with ongoing diuretic/nonsteroidal anti-inflammatory drug use), pyelonephritis/SIRS and possibly evolving to ATN. I cannot completely rule out the possibility of obstruction even with his marginal urine output. I will repeat a renal ultrasound today noted to assess for progression of his hydronephrosis and decide on the utility of percutaneous nephrostomy. His mental status changes in the could be uremic in nature and dialysis may transiently helpful although it is unclear how long he would exactly need dialysis. Chronic dialysis is something that he does not desire and would be unlikely to help much given his overall poor prognosis with regards to prostate cancer. 2. Group B strep bacteremia/pyelonephritis: On penicillin based on sensitivities, afebrile but intermittently tachycardic and with continued leukocytosis. 3. Anemia: Likely chronic from underlying malignancy/chronic kidney disease. Limited utility of intravenous iron with active infection as well as ESA. 4. Metabolic acidosis: Secondary to renal insufficiency, continue to monitor this time 5. Hypoalbuminemia/malnutrition: Secondary to chronic illness/prostate cancer, currently nutrition also hampered by altered mental status. 6. Disposition: Poor  overall prognosis--will likely need a palliative care consult  Latrel Szymczak K. 12/06/2013, 12:13 PM

## 2013-12-06 NOTE — Progress Notes (Signed)
I have spoken with Mrs. Erion who has agreed to meet with me tomorrow 8/25 at 1000 am. We discussed the need to discuss his kidney failure and the potential for dialysis. Will meet with her tomorrow to discuss overall goals of care. Thank you for this consult.   Vinie Sill, NP Palliative Medicine Team Pager # [phone] (M-F 8a-5p) Team Phone # [phone] (Nights/Weekends)

## 2013-12-06 NOTE — Progress Notes (Signed)
Pharmacist Heart Failure Core Measure Documentation  Assessment: Vincent [name] has an EF documented as 40-45% on 05/27/12  by ECHO.  Rationale: Heart failure patients with left ventricular systolic dysfunction (LVSD) and an EF < 40% should be prescribed an angiotensin converting enzyme inhibitor (ACEI) or angiotensin receptor blocker (ARB) at discharge unless a contraindication is documented in the medical record.  This patient is not currently on an ACEI or ARB for HF.  This note is being placed in the record in order to provide documentation that a contraindication to the use of these agents is present for this encounter.  ACE Inhibitor or Angiotensin Receptor Blocker is contraindicated (specify all that apply)  []   ACEI allergy AND ARB allergy []   Angioedema []   Moderate or severe aortic stenosis []   Hyperkalemia []   Hypotension []   Renal artery stenosis [x]   Worsening renal function, preexisting renal disease or dysfunction   Deboraha Sprang 12/06/2013 2:59 PM

## 2013-12-06 NOTE — Progress Notes (Signed)
TEAM 1 - Stepdown/ICU TEAM Progress Note  Vincent [name] NFA:[id number] DOB: [birth year] DOA: 12/05/2013 PCP: [name], MD  Admit HPI / Brief Narrative: 78 y.o. male with h/o advanced prostate cancer, atrial fibrillation, CKD, systolic and diastolic heart failure, and diabetes who came to the ED with "pain all over" and weakness. He had not been taking medications for over a week, and not eating. He fell and was evaluated in ED several days prior to this admit. Since that time he had been largely immobile in bed.  In the ED he was found to be in atrial fibrillation with a heart rate of 130. Blood pressure 90 systolic. Bun creatinine 128/4.5. Creatinine was 3.4 three days prior. Baseline creatinine appears to be 1.7- 3.0. Troponin 0.4.   HPI/Subjective: Pt is more sedate today.  He is twitching, w/ fasciculations of the face being most prominent.  He can not provide a reliable hx.  He looks quite unwell in general.    Assessment/Plan:  Acute renal failure likely pre-renal +/- ATN from rapid a fib, poor po intake, pyelonephritis w/ bacteremia/sepsis, as well as a possible transient BOO due to prostate enlargement and penile edema - likely not a candidate for dialysis due to multiple medical problems and advanced prostate cancer - cont foley to assure no BOO - renal US notes moderate R hydro and possiblle mild L hydro (I suspect pt experienced some BOO prior to admit related to prostate CA progression) - bladder scan today notes only ~40cc in bladder, raising concern that pt is approaching anuric state - after extended discussion w/ the POA today, we have decided to ask Nephrology to comment on the utility or appropriateness of HD, or other interventions - the family has been informed that he will likely die from renal failure if his current status persists   Group B strep pyelo v/s prostatits w/ bacteremia (2/2 blood cx) ampicillin   Atrial fibrillation with rapid ventricular  response Stopped digoxin in face of climbing crt - have been utilizing oral CCB but pt no longer able to take oral meds - rate well controlled - slow volume in setting of progressive oliguric renal failure - add IV BB on schedule at low dose   Toxic metabolic encephalopathy / uremic encephalopathy Due to systemic infection as well as uremia - mental status is worsening   Metabolic acidosis - Lactic acidosis  secondary to relative hypotension and acute renal failure - persistent - see discussion above   Prostate cancer, advanced Was scheduled to have CT chest abd pelvis for restaging but ?missed appointment - reorded for 8/21 but pt not able to consume oral contrast - last PSA 132 May 2015, now 434 (though could be elevated acutely due to prostatitis) - suspect pt is now end-stage - Palliative Care is following - reorder CTs if pt improves to point of tolerating contrast, but at present do not wish to put pt through NG placement for imaging that will not impact his primary issue (terminal renal failure)  Chronic diastolic CHF + Non ischemic cardiomyopathy - EF 40-45% 2D Feb 2014 Pt is developing low grade diffuse edema, related to CHF as well as renal failure in setting of low albumin - minimize fluids and follow   DM2  SSI - follow CBG  LA thrombus on echo Feb 2014  Chronically on pradaxa - holding for now due to hematuria and thrombocytopenia   Obstructive sleep apnea C-pap intol   Tachycardia-bradycardia syndrome, s/p [employer] PTVDP  Feb 2014   Hypothyroid TSH normal but not at goal of 1.0 - avoid adjustment in setting of afib w/ RVR  Protein-calorie malnutrition, severe   Elevated troponin no chest pain - likely related to renal failure and atrial fib/RVR - peaked at 0.53  Chronic anemia  Follow Hgb w/ hematuria - no other evidence of acute blood loss   Thrombocytopenia, unspecified   Code Status: FULL Family Communication: spoke at length w/ daughter/POA at bedside  - discussed case w/ Nephrology  Disposition Plan: SDU   Consultants: Palliative Care   Nephrology   Procedures: none  Antibiotics: Vanc 8/21 Pen G 8/22 >>  DVT prophylaxis: SCDs only due to hematuria and low PLTs  Objective: Blood pressure 148/46, pulse 116, temperature 98.4 F (36.9 C), temperature source Axillary, resp. rate 25, height 5\' 9"  (1.753 m), weight 100.3 kg (221 lb 1.9 oz), SpO2 100.00%.  Intake/Output Summary (Last 24 hours) at 12/06/13 1126 Last data filed at 12/06/13 1100  Gross per 24 hour  Intake   2940 ml  Output    385 ml  Net   2555 ml   Exam: General: No acute respiratory distress at rest - obtunded - fasciculations of face and jerking of upper extremities new today  Lungs: Clear to auscultation bilaterally without wheezes or crackles Cardiovascular: irreg irreg - no appreciable gallup, rub, or M - rate 88 bpm Abdomen: thin, nontender, nondistended, soft, bowel sounds positive, no rebound, no ascites, no appreciable mass Extremities: No significant cyanosis, clubbing;  trace edema bilateral lower extremities GU: edema of penis w/ foley in place - tea colored urine draining into foley bag   Data Reviewed: Basic Metabolic Panel:  Recent Labs Lab 11/25/2013 1450 12/03/13 0425 12/04/13 0312 12/05/13 0320 12/06/13 0256  NA 138 138 140 138 140  K 4.0 4.2 4.6 4.4 4.7  CL 93* 95* 98 97 100  CO2 17* 20 18* 18* 17*  GLUCOSE 180* 163* 139* 162* 194*  BUN 128* 131* 147* 157* 164*  CREATININE 4.56* 4.63* 4.96* 4.92* 4.85*  CALCIUM 9.2 9.5 9.1 8.8 9.1  PHOS  --  5.3*  --  5.1*  --    Liver Function Tests:  Recent Labs Lab 11/29/13 2129 12/11/2013 1703 12/03/13 0425 12/04/13 0312 12/05/13 0320 12/06/13 0256  AST 30 45*  --  68*  --  80*  ALT 11 17  --  19  --  18  ALKPHOS 349* 257*  --  355*  --  312*  BILITOT 0.8 2.7*  --  4.4*  --  5.9*  PROT 7.7 7.1  --  6.3  --  6.3  ALBUMIN 2.7* 2.3* 2.3* 1.8* 1.7* 1.6*    Recent Labs Lab  11/21/2013 1703  LIPASE 56    Recent Labs Lab 11/23/2013 2045  AMMONIA 17   Coags: No results found for this basename: PT, INR,  in the last 168 hours No results found for this basename: PTT,  in the last 168 hours  CBC:  Recent Labs Lab 11/29/13 2129 12/11/2013 1450 12/03/13 0430 12/04/13 0312 12/05/13 0330 12/06/13 0256  WBC 11.4* 14.8* 15.6* 17.6* 19.9* 25.4*  NEUTROABS 10.8*  --  14.4*  --   --   --   HGB 10.2* 9.9* 9.3* 8.1* 8.3* 8.4*  HCT 30.9* 29.8* 27.6* 24.3* 24.2* 23.6*  MCV 95.1 95.5 92.6 92.0 90.3 89.1  PLT 144* 117* 114* 93* 101* 102*   Cardiac Enzymes:  Recent Labs Lab 12/06/2013 1618 11/20/2013 2045 12/03/13  0425 12/03/13 0915  TROPONINI 0.51* 0.53* 0.49* 0.44*   BNP (last 3 results)  Recent Labs  01/31/13 0100 02/02/13 0550 11/14/2013 1450  PROBNP 3302.0* 6692.0* 22168.0*   CBG:  Recent Labs Lab 12/05/13 0855 12/05/13 1159 12/05/13 1734 12/05/13 2127 12/06/13 0805  GLUCAP 159* 186* 204* 211* 214*    Recent Results (from the past 240 hour(s))  URINE CULTURE     Status: None   Collection Time    12/06/2013  6:24 PM      Result Value Ref Range Status   Specimen Description URINE, CATHETERIZED   Final   Special Requests NONE   Final   Culture  Setup Time     Final   Value: 11/20/2013 22:28     Performed at [facility]     Final   Value: >=100,000 COLONIES/ML     Performed at [facility]   Culture     Final   Value: GROUP B STREP(S.AGALACTIAE)ISOLATED     Note: TESTING AGAINST S. AGALACTIAE NOT ROUTINELY PERFORMED DUE TO PREDICTABILITY OF AMP/PEN/VAN SUSCEPTIBILITY.     Performed at [facility]   Report Status 12/04/2013 FINAL   Final  MRSA PCR SCREENING     Status: None   Collection Time    12/03/13  1:22 AM      Result Value Ref Range Status   MRSA by PCR NEGATIVE  NEGATIVE Final   Comment:            The GeneXpert MRSA Assay (FDA     approved for NASAL specimens     only), is one  component of a     comprehensive MRSA colonization     surveillance program. It is not     intended to diagnose MRSA     infection nor to guide or     monitor treatment for     MRSA infections.  CULTURE, BLOOD (ROUTINE X 2)     Status: None   Collection Time    12/03/13  4:13 AM      Result Value Ref Range Status   Specimen Description BLOOD LEFT FOREARM   Final   Special Requests BOTTLES DRAWN AEROBIC AND ANAEROBIC [facility] EACH   Final   Culture  Setup Time     Final   Value: 12/03/2013 08:29     Performed at [facility]   Culture     Final   Value: GROUP B STREP(S.AGALACTIAE)ISOLATED     Note: Gram Stain Report Called to,Read Back By and Verified With: MELVIN KUFSOUR ON 12/03/2013 AT 10:20P BY WILEJ     Performed at [facility]   Report Status 12/05/2013 FINAL   Final   Organism ID, Bacteria GROUP B STREP(S.AGALACTIAE)ISOLATED   Final  CULTURE, BLOOD (ROUTINE X 2)     Status: None   Collection Time    12/03/13  4:25 AM      Result Value Ref Range Status   Specimen Description BLOOD RIGHT ARM   Final   Special Requests BOTTLES DRAWN AEROBIC AND ANAEROBIC 10CC EACH   Final   Culture  Setup Time     Final   Value: 12/03/2013 08:29     Performed at [facility]   Culture     Final   Value: GROUP B STREP(S.AGALACTIAE)ISOLATED     Note: SUSCEPTIBILITIES PERFORMED ON PREVIOUS CULTURE WITHIN THE LAST 5 DAYS.     Note: Gram Stain Report  Called to,Read Back By and Verified With: CHAT B. AT 4:25PM ON 12/03/2013 HAJAM     Performed at [facility]   Report Status 12/05/2013 FINAL   Final    Studies:  Recent x-ray studies have been reviewed in detail by the Attending Physician  Scheduled Meds:  Scheduled Meds: . antiseptic oral rinse  7 mL Mouth Rinse BID  . diltiazem  180 mg Oral Daily  . insulin aspart  0-9 Units Subcutaneous TID WC  . levothyroxine  88 mcg Oral QAC breakfast  . pencillin G potassium IV  3 Million Units Intravenous 6 times per day   . sodium chloride  3 mL Intravenous Q12H   Time spent on care of this patient: 35 mins  Jas Betten T , MD   [facility] Office  [phone] Pager - Text Page per Shea Evans as per below:  On-Call/Text Page:      [email or web address]      password [email or web address]  If 7PM-7AM, please contact night-coverage [email or web address] Password [email or web address] 12/06/2013, 11:26 AM   LOS: 4 days

## 2013-12-07 DIAGNOSIS — G9341 Metabolic encephalopathy: Secondary | ICD-10-CM

## 2013-12-07 DIAGNOSIS — N289 Disorder of kidney and ureter, unspecified: Secondary | ICD-10-CM

## 2013-12-07 DIAGNOSIS — N183 Chronic kidney disease, stage 3 unspecified: Secondary | ICD-10-CM

## 2013-12-07 DIAGNOSIS — N189 Chronic kidney disease, unspecified: Secondary | ICD-10-CM

## 2013-12-07 DIAGNOSIS — I509 Heart failure, unspecified: Secondary | ICD-10-CM

## 2013-12-07 DIAGNOSIS — Z515 Encounter for palliative care: Secondary | ICD-10-CM

## 2013-12-07 DIAGNOSIS — I428 Other cardiomyopathies: Secondary | ICD-10-CM

## 2013-12-07 DIAGNOSIS — I5032 Chronic diastolic (congestive) heart failure: Secondary | ICD-10-CM

## 2013-12-07 DIAGNOSIS — R7881 Bacteremia: Secondary | ICD-10-CM

## 2013-12-07 LAB — RENAL FUNCTION PANEL
ALBUMIN: 1.5 g/dL — AB (ref 3.5–5.2)
ANION GAP: 23 — AB (ref 5–15)
BUN: 176 mg/dL — ABNORMAL HIGH (ref 6–23)
CO2: 15 mEq/L — ABNORMAL LOW (ref 19–32)
Calcium: 9 mg/dL (ref 8.4–10.5)
Chloride: 99 mEq/L (ref 96–112)
Creatinine, Ser: 4.93 mg/dL — ABNORMAL HIGH (ref 0.50–1.35)
GFR calc non Af Amer: 10 mL/min — ABNORMAL LOW (ref >90)
GFR, EST AFRICAN AMERICAN: 12 mL/min — AB (ref >90)
Glucose, Bld: 237 mg/dL — ABNORMAL HIGH (ref 70–99)
PHOSPHORUS: 6 mg/dL — AB (ref 2.3–4.6)
POTASSIUM: 5.6 meq/L — AB (ref 3.7–5.3)
Sodium: 137 mEq/L (ref 137–147)

## 2013-12-07 LAB — POTASSIUM: Potassium: 6.2 mEq/L — ABNORMAL HIGH (ref 3.7–5.3)

## 2013-12-07 LAB — GLUCOSE, CAPILLARY
GLUCOSE-CAPILLARY: 218 mg/dL — AB (ref 70–99)
Glucose-Capillary: 230 mg/dL — ABNORMAL HIGH (ref 70–99)
Glucose-Capillary: 236 mg/dL — ABNORMAL HIGH (ref 70–99)
Glucose-Capillary: 190 mg/dL — ABNORMAL HIGH (ref 70–99)

## 2013-12-07 LAB — CBC
HCT: 23.7 % — ABNORMAL LOW (ref 39.0–52.0)
Hemoglobin: 8.4 g/dL — ABNORMAL LOW (ref 13.0–17.0)
MCH: 31.8 pg (ref 26.0–34.0)
MCHC: 35.4 g/dL (ref 30.0–36.0)
MCV: 89.8 fL (ref 78.0–100.0)
Platelets: 126 10*3/uL — ABNORMAL LOW (ref 150–400)
RBC: 2.64 MIL/uL — AB (ref 4.22–5.81)
RDW: 15.7 % — AB (ref 11.5–15.5)
WBC: 28.8 10*3/uL — ABNORMAL HIGH (ref 4.0–10.5)

## 2013-12-07 MED ORDER — INSULIN ASPART 100 UNIT/ML ~~LOC~~ SOLN
0.0000 [IU] | SUBCUTANEOUS | Status: DC
  Administered 2013-12-07: 7 [IU] via SUBCUTANEOUS
  Administered 2013-12-07: 4 [IU] via SUBCUTANEOUS
  Administered 2013-12-07 (×2): 7 [IU] via SUBCUTANEOUS
  Administered 2013-12-07: 4 [IU] via SUBCUTANEOUS
  Administered 2013-12-08 (×2): 3 [IU] via SUBCUTANEOUS

## 2013-12-07 MED ORDER — ACETAMINOPHEN 650 MG RE SUPP: 650.0000 mg | RECTAL | Status: DC | PRN

## 2013-12-07 MED ORDER — BISACODYL 10 MG RE SUPP: 10.0000 mg | Freq: Every day | RECTAL | Status: DC | PRN

## 2013-12-07 MED ORDER — MORPHINE SULFATE 2 MG/ML IJ SOLN
1.0000 mg | INTRAMUSCULAR | Status: DC | PRN
  Administered 2013-12-08 (×3): 1 mg via INTRAVENOUS
  Filled 2013-12-07 (×3): qty 1

## 2013-12-07 NOTE — Progress Notes (Signed)
Patient ID: Vincent [name], male   DOB: 08/05/1935, 78 y.o.   MRN: [id number]  [facility] Progress Note    Assessment/ Plan:   1. Acute renal failure on chronic kidney disease stage III-IV: Appears pre-renal with pyelonephritis/SIRS and possibly evolving to ATN. I cannot completely rule out the possibility of obstruction even with his marginal urine output. Hydronephrosis unchanged on U/Sound but bladder decompressed. Discussion with family today on goals of care/dialysis. Acute dialysis may likely improve his mentation and labs --hoping that it does not cause acute problems such as hypotension/tachyarrhythmias (HD catheter will need to be placed by IR or CCM to allow this). Chronic dialysis is something that he did not desire and would be unlikely to help much given his overall poor prognosis with regards to prostate cancer.  2. Group B strep bacteremia/pyelonephritis: On penicillin based on sensitivities, afebrile but intermittently tachycardic and with continued leukocytosis.  3. Anemia: Likely chronic from underlying malignancy/chronic kidney disease. Await on [place] meeting to decide on further therapy with ESA/iron 4. Metabolic acidosis: Secondary to renal insufficiency, continue to monitor this time  5. Hypoalbuminemia/malnutrition: Secondary to chronic illness/prostate cancer, currently nutrition also hampered by altered mental status.  6. Disposition: Poor overall prognosis--appreciate palliative care team help   Subjective:   No acute events overnight- mental status unchanged   Objective:   BP 124/52  Pulse 79  Temp(Src) 97.3 F (36.3 C) (Oral)  Resp 25  Ht 5\' 9"  (1.753 m)  Wt 101.1 kg (222 lb 14.2 oz)  BMI 32.90 kg/m2  SpO2 99%  Intake/Output Summary (Last 24 hours) at 12/07/13 0813 Last data filed at 12/07/13 0600  Gross per 24 hour  Intake 1295.66 ml  Output    585 ml  Net 710.66 ml   Weight change: 0.8 kg (1 lb 12.2 oz)  Physical Exam: TZG:[id number] in  bed with facial fasciculations PRF:FMBWG RRR, s1 and s2 with ESM, no rubs Resp:CTA bilaterally, no rales YKZ:LDJT, flat, globally tender, BS normal Ext:No LE edema  Imaging: US Renal  12/06/2013   CLINICAL DATA:  Followup hydronephrosis. 78 year old male. [name]  EXAM: RENAL/URINARY TRACT ULTRASOUND COMPLETE  COMPARISON:  Abdominal ultrasound 12/04/2013  FINDINGS: Right Kidney:  Length: 11.2 cm. Echogenicity of the renal cortex is slightly increased. Renal cortical thickness is preserved. There is moderate hydronephrosis. This appears very similar to the ultrasound dated 12/04/2013.  Left Kidney:  Length: 11.1 cm sagittal length. Slightly difficult imaging of the left kidney due to bowel gas shadowing. Negative for hydronephrosis. Echogenicity of the cortex appears within normal limits. Echogenicity within normal limits. No mass or hydronephrosis visualized.  Bladder:  Appears decompressed by Foley catheter.  Additional: A right pleural effusion is noted.  IMPRESSION: 1. No appreciable change in moderate right hydronephrosis. An etiology for the hydronephrosis is not demonstrated on ultrasound. CT abdomen pelvis could be considered for further evaluation if clinically indicated. 2. Left kidney within normal limits. 3. Urinary bladder decompressed by Foley catheter. 4. Right pleural effusion.   Electronically Signed   By: Vincent [name] M.D.   On: 12/06/2013 21:58    Labs: BMET  Recent Labs Lab 11/19/2013 1450 12/03/13 0425 12/04/13 0312 12/05/13 0320 12/06/13 0256 12/07/13 0337  NA 138 138 140 138 140 137  K 4.0 4.2 4.6 4.4 4.7 5.6*  CL 93* 95* 98 97 100 99  CO2 17* 20 18* 18* 17* 15*  GLUCOSE 180* 163* 139* 162* 194* 237*  BUN 128* 131* 147* 157*  164* 176*  CREATININE 4.56* 4.63* 4.96* 4.92* 4.85* 4.93*  CALCIUM 9.2 9.5 9.1 8.8 9.1 9.0  PHOS  --  5.3*  --  5.1*  --  6.0*   CBC  Recent Labs Lab 12/03/13 0430 12/04/13 0312 12/05/13 0330 12/06/13 0256  12/07/13 0337  WBC 15.6* 17.6* 19.9* 25.4* 28.8*  NEUTROABS 14.4*  --   --   --   --   HGB 9.3* 8.1* 8.3* 8.4* 8.4*  HCT 27.6* 24.3* 24.2* 23.6* 23.7*  MCV 92.6 92.0 90.3 89.1 89.8  PLT 114* 93* 101* 102* 126*    Medications:    . antiseptic oral rinse  7 mL Mouth Rinse BID  . insulin aspart  0-20 Units Subcutaneous 6 times per day  . levothyroxine  44 mcg Intravenous Q24 Hr x 2  . metoprolol  5 mg Intravenous 4 times per day  . pantoprazole (PROTONIX) IV  40 mg Intravenous Q24H  . pencillin G potassium IV  3 Million Units Intravenous 6 times per day  . sodium chloride  3 mL Intravenous Q12H   Elmarie Shiley, MD 12/07/2013, 8:13 AM

## 2013-12-07 NOTE — Consult Note (Signed)
Patient Vincent [name]      DOB: 27-Feb-1936      WER:[id number]     Consult Note from the Palliative Medicine Team at [facility] Requested by: Dr. Conley Canal     PCP: [name], MD Reason for Consultation: [place] and options    Phone [phone]  Assessment of patients Current state: I met with Vincent [name]' family today as he is not responding and unable to participate in conversation. I met today with his loving family: Vincent [name] (married 27 years), daughter Vincent [name]), daughters Vincent [name] and Vincent [name], and niece Vincent [name]. Dr. Posey Pronto was paged at family request and was able to update and answer their questions from a renal perspective - his input is much appreciated. Vincent [name] is very clear that he would not want dialysis and that she believes he would want to be comfortable. Vincent Dyer confirms that he would not want dialysis as he has been very clear about this as his wife has been experiencing dialysis. Vincent Dyer agrees they should respect his wishes - she acknowledges that she would want to try but feels that it is important for her to respect his wishes. The decision has been made to not proceed with any type of dialysis and they do not want him to have an invasive catheter as well. DNR is confirmed and calls have been made to deactivate ICD with family's permission. We will continue him in step down with antibiotics and gentle support but priority placed on comfort. Family is considering full comfort measures and understand the inevitable outcome is death with his renal failure. We also discussed hospice facility as an option if needed and appropriate. Family has acquired much information today which is quite overwhelming to them.  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 He retired and pursued his bachelor's degree and became an Editor, commissioning. He was instrumental in beginning the progmram at [facility] in [place], a halfway house to help those  being released from incarceratation who are now homeless.  I will continue to follow and support.     Goals of Care: 1.  Code Status: DNR   2. Scope of Treatment: 1. Vital Signs: yes 2. AICD: have to called to deactivate ICD  3. Respiratory/Oxygen: yes 4. Nutritional Support/Tube Feeds: no 5. Antibiotics: continue for now 6. IVF: continue as indicated for now 7. Telemetry: continue for now   4. Disposition: Anticipate hospital death vs GIP vs hospice facility.    3. Symptom Management:   1. Pain: Morphine 1 mg every hour prn.  2. Bowel Regimen: Dulcolax supp daily prn.  3. Fever: Acetaminophen supp prn.  4. Nausea/Vomiting: Ondansetron prn.   4. Psychosocial: Emotional support provided to patient and family during difficult conversation and decisions.   5. Spiritual: Support from personal pastor. Family comforted by the fact that 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    Brief HPI: 78 yo male admitted with weakness and "pain all over." He has been seen by Vincent [name] and known to have advanced prostate cancer with evidence of disease in pelvic mass and adenopathy who was supposed to have repeat scan to restage his cancer. He now has acute on chronic renal failure requiring a decision to proceed with dialysis or not. Has had recent decline with decreased appetite and mobility. PMH significant  for atrial fibrillation, hypertension, systolic and diastolic heart failure (EF 40-45%), tachy-brady syndrome, Company secretary, CKD stage 3, diabetes mellitus type 2, hypothyroid, gout.    ROS: Unable to assess - encephalopathy.     PMH:  Past Medical History  Diagnosis Date  . Hypertension   . CHF (congestive heart failure)     EF 40-45% by Echo, combined Systolic & Diastolic  . Irregular heartbeat   . GERD (gastroesophageal reflux disease)   . Diastolic  dysfunction, left ventricle 12/18/2011  . Atrial fibrillation with rapid ventricular response, history of PAF was in SR in 08/2011 12/18/2011  . Fecal impaction 04/13/2012  . Left atrial thrombus 12/2011    By TEE 12/2011  . CKD (chronic kidney disease) stage 3, GFR 30-59 ml/min 12/26/2011  . Pneumonia 03/31/2012; 04/13/2012    Healthcare-associated pneumonia/notes 04/13/2012  . PAF (paroxysmal atrial fibrillation) 04/15/2012    TEE in 12/2011 with LAA thrombus - no DCCV   . Type II diabetes mellitus   . Arthritis     SHOULDERS  . Symptomatic bradycardia 05/29/2012  . Tachycardia-bradycardia syndrome 05/29/2012  . Nonischemic cardiomyopathy     h/o   . Hypothyroid, recently diagnosed  01/26/2013  . Pacemaker   . Orthopnea   . Gout   . Prostate cancer      PSH: Past Surgical History  Procedure Laterality Date  . Prostate surgery    . Tee without cardioversion  12/27/2011    Procedure: TRANSESOPHAGEAL ECHOCARDIOGRAM (TEE);  Surgeon: Pixie Casino, MD;  Location: [facility] OR;  Service: Cardiovascular;  Laterality: N/A;  MD request Main OR - EF 40-45%, mild conc LVH, systolic function mild-mod reduced; LA mod-severely dialted with uniobular appendage  . Cardioversion  12/27/2011    Procedure: CARDIOVERSION;  Surgeon: Pixie Casino, MD;  Location: [facility] OR;  Service: Cardiovascular;  Laterality: N/A;  . Coronary angioplasty with stent placement    . Cardioversion N/A 05/27/2012    Procedure: CARDIOVERSION;  Surgeon: Pixie Casino, MD;  Location: [facility];  Service: Cardiovascular;  Laterality: N/A;  . Pacemaker insertion  05/2011    [employer], Model [id number], Serial [id number]  . Cardiac catheterization  05/28/2007    patent coronaries, EF 50%, mod pulm arterial htn (Dr. Melvern Banker)  . Insert / replace / remove pacemaker     I have reviewed the [place] and SH and  If appropriate update it with new information. No Known Allergies Scheduled Meds: . antiseptic oral rinse  7 mL Mouth Rinse BID  .  insulin aspart  0-20 Units Subcutaneous 6 times per day  . levothyroxine  44 mcg Intravenous Q24 Hr x 2  . metoprolol  5 mg Intravenous 4 times per day  . pantoprazole (PROTONIX) IV  40 mg Intravenous Q24H  . pencillin G potassium IV  3 Million Units Intravenous 6 times per day  . sodium chloride  3 mL Intravenous Q12H   Continuous Infusions: . sodium chloride 30 mL/hr at 12/07/13 0900   PRN Meds:.levalbuterol, morphine injection, ondansetron (ZOFRAN) IV    BP 124/52  Pulse 79  Temp(Src) 97.3 F (36.3 C) (Oral)  Resp 25  Ht $R'5\' 9"'Gx$  (1.753 m)  Wt 101.1 kg (222 lb 14.2 oz)  BMI 32.90 kg/m2  SpO2 99%   PPS: 10%   Intake/Output Summary (Last 24 hours) at 12/07/13 1100 Last data filed at 12/07/13 0900  Gross per 24 hour  Intake 1065.66 ml  Output    585 ml  Net 480.66  ml   LBM: 12/05/13                      Physical Exam:  General: NAD, minimally responsive HEENT: Facial tremor, no JVD, moist mucous membranes Chest: CTA throughout, no labored breathing, symmetric CVS: RRR, S1 S2 Abdomen: Soft, ND, penile edema with foley in place with brown/bloody drainage Ext: Trace edema Neuro: Minimally responsive, may turn towards touch/voice at times - inconsistent, nonverbal, not opening eyes  Labs: CBC    Component Value Date/Time   WBC 28.8* 12/07/2013 0337   WBC 13.1* 08/27/2013 1055   RBC 2.64* 12/07/2013 0337   RBC 3.55* 08/27/2013 1055   HGB 8.4* 12/07/2013 0337   HGB 11.6* 08/27/2013 1055   HCT 23.7* 12/07/2013 0337   HCT 35.6* 08/27/2013 1055   PLT 126* 12/07/2013 0337   PLT 194 08/27/2013 1055   MCV 89.8 12/07/2013 0337   MCV 100.2* 08/27/2013 1055   MCH 31.8 12/07/2013 0337   MCH 32.7 08/27/2013 1055   MCHC 35.4 12/07/2013 0337   MCHC 32.6 08/27/2013 1055   RDW 15.7* 12/07/2013 0337   RDW 15.0* 08/27/2013 1055   LYMPHSABS 0.5* 12/03/2013 0430   LYMPHSABS 2.1 08/27/2013 1055   MONOABS 0.6 12/03/2013 0430   MONOABS 0.7 08/27/2013 1055   EOSABS 0.0 12/03/2013 0430   EOSABS 2.1*  08/27/2013 1055   BASOSABS 0.0 12/03/2013 0430   BASOSABS 0.1 08/27/2013 1055    BMET    Component Value Date/Time   NA 137 12/07/2013 0337   NA 139 08/27/2013 1055   K 6.2* 12/07/2013 0850   K 3.6 08/27/2013 1055   CL 99 12/07/2013 0337   CO2 15* 12/07/2013 0337   CO2 22 08/27/2013 1055   GLUCOSE 237* 12/07/2013 0337   GLUCOSE 258* 08/27/2013 1055   BUN 176* 12/07/2013 0337   BUN 52.5* 08/27/2013 1055   CREATININE 4.93* 12/07/2013 0337   CREATININE 3.0* 08/27/2013 1055   CREATININE 1.71* 04/02/2013 0936   CALCIUM 9.0 12/07/2013 0337   CALCIUM 9.5 08/27/2013 1055   GFRNONAA 10* 12/07/2013 0337   GFRAA 12* 12/07/2013 0337    CMP     Component Value Date/Time   NA 137 12/07/2013 0337   NA 139 08/27/2013 1055   K 6.2* 12/07/2013 0850   K 3.6 08/27/2013 1055   CL 99 12/07/2013 0337   CO2 15* 12/07/2013 0337   CO2 22 08/27/2013 1055   GLUCOSE 237* 12/07/2013 0337   GLUCOSE 258* 08/27/2013 1055   BUN 176* 12/07/2013 0337   BUN 52.5* 08/27/2013 1055   CREATININE 4.93* 12/07/2013 0337   CREATININE 3.0* 08/27/2013 1055   CREATININE 1.71* 04/02/2013 0936   CALCIUM 9.0 12/07/2013 0337   CALCIUM 9.5 08/27/2013 1055   PROT 6.3 12/06/2013 0256   PROT 7.4 08/27/2013 1055   ALBUMIN 1.5* 12/07/2013 0337   ALBUMIN 3.1* 08/27/2013 1055   AST 80* 12/06/2013 0256   AST 19 08/27/2013 1055   ALT 18 12/06/2013 0256   ALT 13 08/27/2013 1055   ALKPHOS 312* 12/06/2013 0256   ALKPHOS 202* 08/27/2013 1055   BILITOT 5.9* 12/06/2013 0256   BILITOT 0.61 08/27/2013 1055   GFRNONAA 10* 12/07/2013 0337   GFRAA 12* 12/07/2013 0337      Time In Time Out Total Time Spent with Patient Total Overall Time  1010 1125 68min 68min    Greater than 50%  of this time was spent counseling and coordinating care related to the above assessment  and plan.  Vinie Sill, NP Palliative Medicine Team Pager # [phone] (M-F 8a-5p) Team Phone # [phone] (Nights/Weekends)

## 2013-12-07 NOTE — Progress Notes (Signed)
Spoke with Corene Cornea from [facility] who will proceed with deactivation of ICD, leaving pacing function intact, per family wishes for Mr. Whang. Full note to follow from palliative standpoint.  Vinie Sill, NP Palliative Medicine Team Pager # [phone] (M-F 8a-5p) Team Phone # [phone] (Nights/Weekends)

## 2013-12-07 NOTE — Progress Notes (Signed)
Pharmacy: Penicillin G  78yom continues on day #4 pencillin G for group b strep pyelonephritis and bacteremia. SCr remains elevated but has seemed to plateau. Renal on board and no acute need for dialysis at this point. As penicillin G exhibits time-dependent killing and has a short half-life it is pharmacokinetically more optimal to maintain the dosing interval as q4h and give a decreased dose for renal dysfunction. A usual total daily dose for bacteremia would be 24 million units, so we will target to provide ~75% of that amount for him given his renal dysfunction.  8/21 Vancomycin>> 8/22 8/22 Penicillin G>> 8/21 blood>> 2/2 group b strep - strep agalactiae (sensitive to PCN)  8/21 urine>> >100k group b strep - strep agalactiae   Plan: 1) Continue penicillin G 3 millions units q4 (18 million units/day) 2) Follow up renal plans  Nena Jordan, PharmD, BCPS 12/07/2013, 1:06 PM

## 2013-12-07 NOTE — Consult Note (Signed)
I have reviewed and discussed case with Nurse Practitioner And agree with documentation and plan as noted above   Maleiyah Releford J. Elmyra Banwart D.O.  Palliative Medicine Team at [facility]  Team Phone: [phone]    

## 2013-12-07 NOTE — Plan of Care (Signed)
Problem: Phase III Progression Outcomes Goal: Sinus rhythm established or heart rate < 100 at rest Outcome: Not Met (add Reason) Not appropriate goal. Patient DNR

## 2013-12-07 NOTE — Progress Notes (Signed)
[place] TEAM 1 - Stepdown/ICU TEAM Progress Note  Vincent [name] YYQ:[id number] DOB: May 03, 1935 DOA: 12/13/2013 PCP: [name], MD  Admit HPI / Brief Narrative: 78 y.o. BM PMHx Hx  advanced prostate cancer, atrial fibrillation, CKD, systolic and diastolic heart failure, and diabetes who came to the ED with "pain all over" and weakness. He had not been taking medications for over a week, and not eating. He fell and was evaluated in ED several days prior to this admit. Since that time he had been largely immobile in bed. In the ED he was found to be in atrial fibrillation with a heart rate of 130. Blood pressure 90 systolic. Bun creatinine 128/4.5. Creatinine was 3.4 three days prior. Baseline creatinine appears to be 1.7- 3.0. Troponin 0.4.      HPI/Subjective: 8/25 patient unresponsive, family at bedside. Per daughter patient last spoke on Sunday. Family has decided to make patient comfort care  Assessment/Plan: Acute renal failure  -likely pre-renal +/- ATN from rapid a fib, poor po intake, pyelonephritis w/ bacteremia/sepsis, as well as a possible transient BOO due to prostate enlargement and penile edema  -Family conference with NP Vinie Sill (palliative care), have decided to make patient comfort care  -If patient survives the night will move to more comfortable room   Group B strep pyelo v/s prostatits w/ bacteremia (2/2 blood cx)  -Continue penicillin G - comfort care   Atrial fibrillation with rapid ventricular response  -Continue metoprolol 5 mg  QID  -  comfort care   Toxic metabolic encephalopathy / uremic encephalopathy  -Patient noncommunicative today. 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 if patient survives the night      Code Status:  comfort care  Family Communication: no family present at time of exam Disposition Plan:    Consultants: NP Vinie Sill (palliative care) Dr. Graylon Gunning (nephrology)   Procedure/Significant Events: NA   Culture 8/20 urine positive group B strep 8/21 MRSA by PCR negative 8/21 blood left/right forearm positive group B strep   Antibiotics: Vanc 8/21>> stopped 8/22  Pen G 8/22 >>   DVT prophylaxis: SCD   Devices NA   LINES / TUBES:      Continuous Infusions: . sodium chloride 30 mL/hr at 12/07/13 1700    Objective: VITAL SIGNS: Temp: 97.9 F (36.6 C) (08/25 1637) Temp src: Oral (08/25 1637) BP: 118/39 mmHg (08/25 1637) Pulse Rate: 97 (08/25 1637) SPO2; FIO2:   Intake/Output Summary (Last 24 hours) at 12/07/13 1805 Last data filed at 12/07/13 1700  Gross per 24 hour  Intake    990 ml  Output    335 ml  Net  655 ml     Exam: General: A./O. x0, responds slightly to painful stimuli, No acute respiratory distress Lungs: Clear to auscultation bilaterally without wheezes or crackles Cardiovascular: Regular rate and rhythm without murmur gallop or rub normal S1 and S2 Abdomen: Nontender, nondistended, soft, bowel sounds positive, no rebound, no ascites, no appreciable mass Extremities: No significant cyanosis, clubbing, or edema bilateral lower extremities  Data Reviewed: Basic Metabolic Panel:  Recent Labs Lab 12/03/13 0425 12/04/13 0312 12/05/13 0320  12/06/13 0256 12/07/13 0337 12/07/13 0850  NA 138 140 138 140 137  --   K 4.2 4.6 4.4 4.7 5.6* 6.2*  CL 95* 98 97 100 99  --   CO2 20 18* 18* 17* 15*  --   GLUCOSE 163* 139* 162* 194* 237*  --   BUN 131* 147* 157* 164* 176*  --   CREATININE 4.63* 4.96* 4.92* 4.85* 4.93*  --   CALCIUM 9.5 9.1 8.8 9.1 9.0  --   PHOS 5.3*  --  5.1*  --  6.0*  --    Liver Function Tests:  Recent Labs Lab 11/28/2013 1703 12/03/13 0425 12/04/13 0312 12/05/13 0320 12/06/13 0256 12/07/13 0337  AST 45*  --  68*  --  80*  --   ALT 17  --  19  --  18  --   ALKPHOS 257*  --  355*  --  312*  --   BILITOT 2.7*  --  4.4*  --  5.9*  --   PROT 7.1  --  6.3  --  6.3  --   ALBUMIN 2.3* 2.3* 1.8* 1.7* 1.6* 1.5*    Recent Labs Lab 12/07/2013 1703  LIPASE 56    Recent Labs Lab 11/17/2013 2045  AMMONIA 17   CBC:  Recent Labs Lab 12/03/13 0430 12/04/13 0312 12/05/13 0330 12/06/13 0256 12/07/13 0337  WBC 15.6* 17.6* 19.9* 25.4* 28.8*  NEUTROABS 14.4*  --   --   --   --   HGB 9.3* 8.1* 8.3* 8.4* 8.4*  HCT 27.6* 24.3* 24.2* 23.6* 23.7*  MCV 92.6 92.0 90.3 89.1 89.8  PLT 114* 93* 101* 102* 126*   Cardiac Enzymes:  Recent Labs Lab 12/04/2013 1618 11/27/2013 2045 12/03/13 0425 12/03/13 0915  TROPONINI 0.51* 0.53* 0.49* 0.44*   BNP (last 3 results)  Recent Labs  01/31/13 0100 02/02/13 0550 12/01/2013 1450  PROBNP 3302.0* 6692.0* 22168.0*   CBG:  Recent Labs Lab 12/06/13 1824 12/06/13 2339 12/07/13 0741 12/07/13 1217 12/07/13 1640  GLUCAP 240* 218* 230* 218* 236*    Recent Results (from the past 240 hour(s))  URINE CULTURE     Status: None   Collection Time    11/29/2013  6:24 PM      Result Value Ref Range Status   Specimen Description URINE, CATHETERIZED   Final   Special Requests NONE   Final   Culture  Setup Time     Final   Value: 11/16/2013 22:28     Performed at [facility]     Final   Value: >=100,000 COLONIES/ML     Performed at [facility]   Culture     Final   Value: GROUP B STREP(S.AGALACTIAE)ISOLATED     Note: TESTING AGAINST S. AGALACTIAE NOT ROUTINELY PERFORMED DUE TO PREDICTABILITY OF AMP/PEN/VAN SUSCEPTIBILITY.     Performed at [facility]   Report Status 12/04/2013 FINAL   Final  MRSA PCR SCREENING  Status: None   Collection Time    12/03/13  1:22 AM      Result Value Ref Range Status   MRSA by PCR NEGATIVE  NEGATIVE Final   Comment:            The GeneXpert MRSA Assay (FDA     approved for NASAL specimens     only), is one component of a     comprehensive MRSA colonization     surveillance program. It is not     intended to diagnose MRSA     infection nor to guide or     monitor treatment for     MRSA infections.  CULTURE, BLOOD (ROUTINE X 2)     Status: None   Collection Time    12/03/13  4:13 AM      Result Value Ref Range Status   Specimen Description BLOOD LEFT FOREARM   Final   Special Requests BOTTLES DRAWN AEROBIC AND ANAEROBIC [facility] EACH   Final   Culture  Setup Time     Final   Value: 12/03/2013 08:29     Performed at [facility]   Culture     Final   Value: GROUP B STREP(S.AGALACTIAE)ISOLATED     Note: Gram Stain Report Called to,Read Back By and Verified With: MELVIN KUFSOUR ON 12/03/2013 AT 10:20P BY WILEJ     Performed at [facility]   Report Status 12/05/2013 FINAL   Final   Organism ID, Bacteria GROUP B STREP(S.AGALACTIAE)ISOLATED   Final  CULTURE, BLOOD (ROUTINE X 2)     Status: None   Collection Time    12/03/13  4:25 AM      Result Value Ref Range Status   Specimen Description BLOOD RIGHT ARM   Final   Special Requests BOTTLES DRAWN AEROBIC AND ANAEROBIC 10CC EACH   Final   Culture  Setup Time     Final   Value: 12/03/2013 08:29     Performed at [facility]   Culture     Final   Value: GROUP B STREP(S.AGALACTIAE)ISOLATED     Note: SUSCEPTIBILITIES PERFORMED ON PREVIOUS CULTURE WITHIN THE LAST 5 DAYS.     Note: Gram Stain Report  Called to,Read Back By and Verified With: CHAT B. AT 4:25PM ON 12/03/2013 HAJAM     Performed at [facility]   Report Status 12/05/2013 FINAL   Final     Studies:  Recent x-ray studies have been reviewed in detail by the Attending Physician  Scheduled Meds:  Scheduled Meds: . antiseptic oral rinse  7 mL Mouth Rinse BID  . insulin aspart  0-20 Units Subcutaneous 6 times per day  . metoprolol  5 mg Intravenous 4 times per day  . pantoprazole (PROTONIX) IV  40 mg Intravenous Q24H  . pencillin G potassium IV  3 Million Units Intravenous 6 times per day  . sodium chloride  3 mL Intravenous Q12H    Time spent on care of this patient: 40 mins   Allie Bossier , MD   [facility] Office  [phone] Pager [phone]  On-Call/Text Page:      [email or web address]      password [email or web address]  If 7PM-7AM, please contact night-coverage [email or web address] Password [email or web address] 12/07/2013, 6:05 PM   LOS: 5 days

## 2013-12-07 NOTE — Progress Notes (Signed)
Chaplain's Note:  I met briefly with patient who I have known for some years.  He seemed to recognize me and nodded his head as though understanding. However he was not responsive beyond this. I will follow up with family who were not present. And offer support.   He has the support of his local parish and his [place].    Sue Lush

## 2013-12-08 LAB — GLUCOSE, CAPILLARY
Glucose-Capillary: 116 mg/dL — ABNORMAL HIGH (ref 70–99)
Glucose-Capillary: 127 mg/dL — ABNORMAL HIGH (ref 70–99)
Glucose-Capillary: 135 mg/dL — ABNORMAL HIGH (ref 70–99)
Glucose-Capillary: 157 mg/dL — ABNORMAL HIGH (ref 70–99)

## 2013-12-08 MED ORDER — LORAZEPAM 2 MG/ML IJ SOLN: 0.5000 mg | INTRAMUSCULAR | Status: DC | PRN

## 2013-12-08 MED ORDER — ATROPINE SULFATE 1 % OP SOLN
4.0000 [drp] | OPHTHALMIC | Status: DC | PRN
  Filled 2013-12-08: qty 2

## 2013-12-08 MED ORDER — MORPHINE SULFATE 2 MG/ML IJ SOLN
1.0000 mg | INTRAMUSCULAR | Status: DC | PRN
  Administered 2013-12-08: 1 mg via INTRAVENOUS
  Administered 2013-12-08: 2 mg via INTRAVENOUS
  Filled 2013-12-08 (×2): qty 1

## 2013-12-08 NOTE — Progress Notes (Signed)
[place] TEAM 1 - Stepdown/ICU TEAM Progress Note  WEBER MONNIER ATF:[id number] DOB: [birth year] DOA: 12/11/2013 PCP: [name], MD  Admit HPI / Brief Narrative: 78 y.o. male with h/o advanced prostate cancer, atrial fibrillation, CKD, systolic and diastolic heart failure, and diabetes who came to the ED with "pain all over" and weakness. He had not been taking medications for over a week, and not eating. He fell and was evaluated in ED several days prior to this admit. Since that time he had been largely immobile in bed.  In the ED he was found to be in atrial fibrillation with a heart rate of 130. Blood pressure 90 systolic. Bun creatinine 128/4.5. Creatinine was 3.4 three days prior. Baseline creatinine appears to be 1.7- 3.0. Troponin 0.4.   Since his admission, the pt was been found to have a group B strep pyelo w/ bacteremia.  He has been treated for this appropriately, but clinically has not responded.  His acute on chronic renal failure, noted at admit, has progressed, and he is suffering with profound uremia.  Nephrology has been consulted, and extensive discussion have been carried out with the family.  Ultimately, in the setting of probable recurrent severe prostate CA, the family has chosen to honor his prior stated wish to avoid HD, and to transition to comfort focused care.    HPI/Subjective: Pt is obtunded at the time of my visit.    Assessment/Plan:  Acute renal failure likely pre-renal +/- ATN from rapid a fib, poor po intake, pyelonephritis w/ bacteremia/sepsis, as well as a possible transient BOO due to prostate enlargement and penile edema - family has opted to not pursue HD in keeping with patient's prior stated wishes - cont foley to assure no BOO/for comfort - renal US notes moderate R hydro and possiblle mild L hydro (I suspect pt experienced some BOO prior to admit related to prostate CA progression) - bladder scan noted only ~40cc in bladder confirming bladder  decompresssion - f/u renal US notes stability in hydro - after extended discussion w/ the POA, Nephrology, and Palliative Care, the decision has been made to pursue comfort directed care   Group B strep pyelo v/s prostatits w/ bacteremia (2/2 blood cx) Ongoing Penicillin IV has not impacted his clinical status significantly - will d/c abx today   Atrial fibrillation with rapid ventricular response Stopped digoxin in face of climbing crt - have been utilizing oral CCB but pt no longer able to take oral meds - add IV BB on schedule at low dose to continue to avoid discomfort of severe tachy  Toxic metabolic encephalopathy / uremic encephalopathy Due to systemic infection as well as uremia - mental status is worsening   Metabolic acidosis - Lactic acidosis  secondary to relative hypotension and acute renal failure - persistent - see discussion above   Prostate cancer, advanced Was scheduled to have CT chest abd pelvis for restaging but ?missed appointment - reorded for 8/21 but pt not able to consume oral contrast - last PSA 132 May 2015, now 434 (though could be elevated acutely due to prostatitis) - suspect pt is now end-stage - Palliative Care is following - as noted above, transitioning to comfort focused care   Chronic diastolic CHF + Non ischemic cardiomyopathy - EF 40-45% 2D Feb 2014 Pt is developing low grade diffuse edema, related to CHF as well as renal failure in setting of low albumin   DM2   LA thrombus on echo Feb 2014  Chronically  on pradaxa - have been holding due to hematuria and thrombocytopenia   Obstructive sleep apnea C-pap intol   Tachycardia-bradycardia syndrome, 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   Hypothyroid TSH normal but not at goal of 1.0 - avoided adjustment in setting of afib w/ RVR  Protein-calorie malnutrition, severe   Elevated troponin no chest pain - likely related to renal failure and atrial fib/RVR - peaked at 0.53  Chronic anemia    Thrombocytopenia, unspecified   Code Status: FULL Family Communication: spoke at length w/ wife at bedside  Disposition Plan: SDU   Consultants: Palliative Care   Nephrology   Procedures: none  Antibiotics: Vanc 8/21 Pen G 8/22 >>8/26  DVT prophylaxis: Comfort focus   Objective: Blood pressure 101/37, pulse 115, temperature 98.1 F (36.7 C), temperature source Oral, resp. rate 23, height 5\' 9"  (1.753 m), weight 101.6 kg (223 lb 15.8 oz), SpO2 100.00%.  Intake/Output Summary (Last 24 hours) at 12/11/2013 1515 Last data filed at 11/17/2013 0846  Gross per 24 hour  Intake    850 ml  Output    210 ml  Net    640 ml   Exam: Pt is obtunded.  He does not appear to be uncomfortable.  He continues to experience fasciculations of the face and upper extremities.    Data Reviewed: Basic Metabolic Panel:  Recent Labs Lab 12/03/13 0425 12/04/13 4818 12/05/13 0320 12/06/13 0256 12/07/13 0337 12/07/13 0850  NA 138 140 138 140 137  --   K 4.2 4.6 4.4 4.7 5.6* 6.2*  CL 95* 98 97 100 99  --   CO2 20 18* 18* 17* 15*  --   GLUCOSE 163* 139* 162* 194* 237*  --   BUN 131* 147* 157* 164* 176*  --   CREATININE 4.63* 4.96* 4.92* 4.85* 4.93*  --   CALCIUM 9.5 9.1 8.8 9.1 9.0  --   PHOS 5.3*  --  5.1*  --  6.0*  --    Liver Function Tests:  Recent Labs Lab 12/11/2013 1703 12/03/13 0425 12/04/13 0312 12/05/13 0320 12/06/13 0256 12/07/13 0337  AST 45*  --  68*  --  80*  --   ALT 17  --  19  --  18  --   ALKPHOS 257*  --  355*  --  312*  --   BILITOT 2.7*  --  4.4*  --  5.9*  --   PROT 7.1  --  6.3  --  6.3  --   ALBUMIN 2.3* 2.3* 1.8* 1.7* 1.6* 1.5*    Recent Labs Lab 11/22/2013 1703  LIPASE 56    Recent Labs Lab 11/15/2013 2045  AMMONIA 17   CBC:  Recent Labs Lab 12/03/13 0430 12/04/13 0312 12/05/13 0330 12/06/13 0256 12/07/13 0337  WBC 15.6* 17.6* 19.9* 25.4* 28.8*  NEUTROABS 14.4*  --   --   --   --   HGB 9.3* 8.1* 8.3* 8.4* 8.4*  HCT 27.6* 24.3* 24.2*  23.6* 23.7*  MCV 92.6 92.0 90.3 89.1 89.8  PLT 114* 93* 101* 102* 126*   Cardiac Enzymes:  Recent Labs Lab 11/15/2013 1618 11/26/2013 2045 12/03/13 0425 12/03/13 0915  TROPONINI 0.51* 0.53* 0.49* 0.44*   BNP (last 3 results)  Recent Labs  01/31/13 0100 02/02/13 0550 12/01/2013 1450  PROBNP 3302.0* 6692.0* 22168.0*   CBG:  Recent Labs Lab 12/07/13 1949 12/07/13 2336 11/24/2013 0338 11/25/2013 0816 11/30/2013 1215  GLUCAP 190* 157* 127* 116* 135*    Recent Results (  from the past 240 hour(s))  URINE CULTURE     Status: None   Collection Time    12/12/2013  6:24 PM      Result Value Ref Range Status   Specimen Description URINE, CATHETERIZED   Final   Special Requests NONE   Final   Culture  Setup Time     Final   Value: 12/06/2013 22:28     Performed at [place]     Final   Value: >=100,000 COLONIES/ML     Performed at [facility]   Culture     Final   Value: GROUP B STREP(S.AGALACTIAE)ISOLATED     Note: TESTING AGAINST S. AGALACTIAE NOT ROUTINELY PERFORMED DUE TO PREDICTABILITY OF AMP/PEN/VAN SUSCEPTIBILITY.     Performed at [facility]   Report Status 12/04/2013 FINAL   Final  MRSA PCR SCREENING     Status: None   Collection Time    12/03/13  1:22 AM      Result Value Ref Range Status   MRSA by PCR NEGATIVE  NEGATIVE Final   Comment:            The GeneXpert MRSA Assay (FDA     approved for NASAL specimens     only), is one component of a     comprehensive MRSA colonization     surveillance program. It is not     intended to diagnose MRSA     infection nor to guide or     monitor treatment for     MRSA infections.  CULTURE, BLOOD (ROUTINE X 2)     Status: None   Collection Time    12/03/13  4:13 AM      Result Value Ref Range Status   Specimen Description BLOOD LEFT FOREARM   Final   Special Requests BOTTLES DRAWN AEROBIC AND ANAEROBIC [facility] EACH   Final   Culture  Setup Time     Final   Value: 12/03/2013 08:29      Performed at [facility]   Culture     Final   Value: GROUP B STREP(S.AGALACTIAE)ISOLATED     Note: Gram Stain Report Called to,Read Back By and Verified With: MELVIN KUFSOUR ON 12/03/2013 AT 10:20P BY WILEJ     Performed at [facility]   Report Status 12/05/2013 FINAL   Final   Organism ID, Bacteria GROUP B STREP(S.AGALACTIAE)ISOLATED   Final  CULTURE, BLOOD (ROUTINE X 2)     Status: None   Collection Time    12/03/13  4:25 AM      Result Value Ref Range Status   Specimen Description BLOOD RIGHT ARM   Final   Special Requests BOTTLES DRAWN AEROBIC AND ANAEROBIC 10CC EACH   Final   Culture  Setup Time     Final   Value: 12/03/2013 08:29     Performed at [facility]   Culture     Final   Value: GROUP B STREP(S.AGALACTIAE)ISOLATED     Note: SUSCEPTIBILITIES PERFORMED ON PREVIOUS CULTURE WITHIN THE LAST 5 DAYS.     Note: Gram Stain Report Called to,Read Back By and Verified With: CHAT B. AT 4:25PM ON 12/03/2013 HAJAM     Performed at [facility]   Report Status 12/05/2013 FINAL   Final    Studies:  Recent x-ray studies have been reviewed in detail by the Attending Physician  Scheduled Meds:  Scheduled Meds: . antiseptic oral rinse  7 mL Mouth Rinse BID  . insulin aspart  0-20 Units Subcutaneous 6 times per day  . metoprolol  5 mg Intravenous 4 times per day  . pantoprazole (PROTONIX) IV  40 mg Intravenous Q24H  . pencillin G potassium IV  3 Million Units Intravenous 6 times per day  . sodium chloride  3 mL Intravenous Q12H   Time spent on care of this patient: 25 mins  [facility] T , MD   [facility] Office  [phone] Pager - Text Page per Shea Evans as per below:  On-Call/Text Page:      [email or web address]      password [email or web address]  If 7PM-7AM, please contact night-coverage [email or web address] Password [email or web address] 11/24/2013, 3:15 PM   LOS: 6 days

## 2013-12-08 NOTE — Progress Notes (Signed)
2mg  metoprolol given. Pt heart rate dropped to 60. Remaining 3mg  not given.

## 2013-12-08 NOTE — Progress Notes (Signed)
Progress Note from the Palliative Medicine Team at [facility]:  I spoke this morning with his daughter and Shelbie Hutching. [name] tells me that she spoke with her children and explained to them that grandpa won't be coming home. She says that they seemed to understand very well and she brought them up her to see him for a few minutes. She also explains that her parents are raising her sister's 3 children (ages 71-13 I believe) and they are the only parents they know. We discussed the counseling services through hospice and how this is available and that they have a program specifically for children. She tells me that she hasn't really had much time to think about everything going on and has been focused on her children. She says she is just waiting to see what happens next. We talked about signs of decline at end of life (irregular breathing pattern, cool extremities, mottling, etc). I provided listening and emotional support.    Objective: No Known Allergies Scheduled Meds: . antiseptic oral rinse  7 mL Mouth Rinse BID  . insulin aspart  0-20 Units Subcutaneous 6 times per day  . metoprolol  5 mg Intravenous 4 times per day  . pantoprazole (PROTONIX) IV  40 mg Intravenous Q24H  . pencillin G potassium IV  3 Million Units Intravenous 6 times per day  . sodium chloride  3 mL Intravenous Q12H   Continuous Infusions: . sodium chloride 30 mL/hr at 12/07/13 1900   PRN Meds:.acetaminophen, bisacodyl, levalbuterol, morphine injection, ondansetron (ZOFRAN) IV  BP 101/37  Pulse 115  Temp(Src) 98.1 F (36.7 C) (Oral)  Resp 23  Ht 5\' 9"  (1.753 m)  Wt 101.6 kg (223 lb 15.8 oz)  BMI 33.06 kg/m2  SpO2 100%   PPS: 10%     Intake/Output Summary (Last 24 hours) at 11/28/2013 1357 Last data filed at 12/02/2013 0846  Gross per 24 hour  Intake    910 ml  Output    250 ml  Net    660 ml      LBM: 12/11/2013  Physical Exam:  General: NAD, appears comfortable, unresponsive, skin color more  dusky  HEENT: Facial tremor, no JVD, moist mucous membranes, facial muscles more relaxed  Chest: No labored breathing, symmetric  CVS: RRR - tachycardic, S1 S2  Abdomen: Soft, ND, penile edema with foley in place with brown/bloody drainage  Ext: 2+ edema all extremities Neuro: Unresponsive   Labs: CBC    Component Value Date/Time   WBC 28.8* 12/07/2013 0337   WBC 13.1* 08/27/2013 1055   RBC 2.64* 12/07/2013 0337   RBC 3.55* 08/27/2013 1055   HGB 8.4* 12/07/2013 0337   HGB 11.6* 08/27/2013 1055   HCT 23.7* 12/07/2013 0337   HCT 35.6* 08/27/2013 1055   PLT 126* 12/07/2013 0337   PLT 194 08/27/2013 1055   MCV 89.8 12/07/2013 0337   MCV 100.2* 08/27/2013 1055   MCH 31.8 12/07/2013 0337   MCH 32.7 08/27/2013 1055   MCHC 35.4 12/07/2013 0337   MCHC 32.6 08/27/2013 1055   RDW 15.7* 12/07/2013 0337   RDW 15.0* 08/27/2013 1055   LYMPHSABS 0.5* 12/03/2013 0430   LYMPHSABS 2.1 08/27/2013 1055   MONOABS 0.6 12/03/2013 0430   MONOABS 0.7 08/27/2013 1055   EOSABS 0.0 12/03/2013 0430   EOSABS 2.1* 08/27/2013 1055   BASOSABS 0.0 12/03/2013 0430   BASOSABS 0.1 08/27/2013 1055    BMET    Component Value Date/Time   NA 137 12/07/2013 0337  NA 139 08/27/2013 1055   K 6.2* 12/07/2013 0850   K 3.6 08/27/2013 1055   CL 99 12/07/2013 0337   CO2 15* 12/07/2013 0337   CO2 22 08/27/2013 1055   GLUCOSE 237* 12/07/2013 0337   GLUCOSE 258* 08/27/2013 1055   BUN 176* 12/07/2013 0337   BUN 52.5* 08/27/2013 1055   CREATININE 4.93* 12/07/2013 0337   CREATININE 3.0* 08/27/2013 1055   CREATININE 1.71* 04/02/2013 0936   CALCIUM 9.0 12/07/2013 0337   CALCIUM 9.5 08/27/2013 1055   GFRNONAA 10* 12/07/2013 0337   GFRAA 12* 12/07/2013 0337    CMP     Component Value Date/Time   NA 137 12/07/2013 0337   NA 139 08/27/2013 1055   K 6.2* 12/07/2013 0850   K 3.6 08/27/2013 1055   CL 99 12/07/2013 0337   CO2 15* 12/07/2013 0337   CO2 22 08/27/2013 1055   GLUCOSE 237* 12/07/2013 0337   GLUCOSE 258* 08/27/2013 1055   BUN 176* 12/07/2013 0337    BUN 52.5* 08/27/2013 1055   CREATININE 4.93* 12/07/2013 0337   CREATININE 3.0* 08/27/2013 1055   CREATININE 1.71* 04/02/2013 0936   CALCIUM 9.0 12/07/2013 0337   CALCIUM 9.5 08/27/2013 1055   PROT 6.3 12/06/2013 0256   PROT 7.4 08/27/2013 1055   ALBUMIN 1.5* 12/07/2013 0337   ALBUMIN 3.1* 08/27/2013 1055   AST 80* 12/06/2013 0256   AST 19 08/27/2013 1055   ALT 18 12/06/2013 0256   ALT 13 08/27/2013 1055   ALKPHOS 312* 12/06/2013 0256   ALKPHOS 202* 08/27/2013 1055   BILITOT 5.9* 12/06/2013 0256   BILITOT 0.61 08/27/2013 1055   GFRNONAA 10* 12/07/2013 0337   GFRAA 12* 12/07/2013 0337    Assessment and Plan: 1. Code Status: DNR 2. Symptom Control: 1. Pain: Morphine 1 mg every hour prn. 2. Fever: Acetaminophen prn.  3. Anxiety: Ativan 0.5-1 mg every 4 hours prn.  4. Terminal Secretions: Atropine SL 4 drops every 4 hours prn. 5. Bowel Regimen: Dulcolax supp prn.  6. Nausea: Ondansetron prn.  3. Psycho/Social: Emotional support provided to patient and family.  4. 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  5. Disposition: Anticipate hospital death.    Time In Time Out Total Time Spent with Patient Total Overall Time  0900 0930 27min 1min    Greater than 50%  of this time was spent counseling and coordinating care related to the above assessment and plan.  Vinie Sill, NP Palliative Medicine Team Pager # [phone] (M-F 8a-5p) Team Phone # [phone] (Nights/Weekends)   1

## 2013-12-08 NOTE — Progress Notes (Signed)
Patient ID: Vincent [name], male   DOB: 1936/03/05, 78 y.o.   MRN: [id number] BP 105/46  Pulse 95  Temp(Src) 97.4 F (36.3 C) (Oral)  Resp 24  Ht 5\' 9"  (1.753 m)  Wt 101.6 kg (223 lb 15.8 oz)  BMI 33.06 kg/m2  SpO2 98%  Patient resting comfortably in bed. No family at bedside. Plans noted for palliative measures only. Will sign off from active care and be available for any emerging questions/issues.  Elmarie Shiley MD [facility] Office # [phone] Pager # [phone] 8:17 AM

## 2013-12-09 ENCOUNTER — Other Ambulatory Visit: Payer: Medicare Other

## 2013-12-10 ENCOUNTER — Other Ambulatory Visit: Payer: Self-pay | Admitting: Pharmacist

## 2013-12-14 NOTE — Progress Notes (Signed)
RN paged secondary to pt expiring at 2330 on 11/15/2013. Pt was DNR and comfort care and hospital death was expected. No family here at present. Pronounced by 2 RNs. Death certificate completed.  Clance Boll, NP [facility]

## 2013-12-14 NOTE — Progress Notes (Signed)
Chaplain responded to page indicating family desire to have chaplain services.  Spiritual conversation and prayer at pt. Bedside, wife and daughter gathered.  Rev. Clois Dupes, chaplain [phone]

## 2013-12-14 NOTE — Progress Notes (Signed)
Patient found to be unresponsive at 23:30. No pulses to palpation or auscultation. Verified by second nurse, Maryruth Hancock, RN. Patients family was notified of death, as well as the attending physician, Baltazar Najjar, the chaplain was also notified and provided spiritual care for the family. CDS was notified of death and was deemed not suitable for donation. Physical and emotional support was given to the family, and information regarding funeral home choices was also given.

## 2013-12-14 DEATH — deceased

## 2013-12-22 NOTE — Discharge Summary (Addendum)
Death Summary  Vincent [name] UPJ:[id number] DOB: May 19, 1935 DOA: December 30, 2013  PCP: [name], MD  Admit date: 12-30-13 Date of Death: 01/05/14  Final Diagnoses:    Acute renal failure - pre-renal > evolving to ATN   Group B strep pyelonphritis w/ sepsis (WBC 14.8, HR 147, RR 22)   Atrial fibrillation with rapid ventricular response   Hypertension   Prostate cancer - advanced    Chronic diastolic CHF (congestive heart failure)   DM2 (diabetes mellitus, type 2)   LA thrombus on echo Feb 2014   Obstructive sleep apnea, C-pap intol   Tachycardia-bradycardia syndrome, 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   Hypothyroid, recently diagnosed    Non ischemic cardiomyopathy- EF 40-45% 2D Feb 2014   Protein-calorie malnutrition, severe   Elevated troponin   Chronic anemia   Metabolic acidosis   Thrombocytopenia, unspecified  History of present illness:  78 y.o. male with h/o advanced prostate cancer, atrial fibrillation, CKD, systolic and diastolic heart failure, and diabetes who came to the ED with "pain all over" and weakness. He had not been taking medications for over a week, and not eating. He fell and was evaluated in ED several days prior to this admit. Since that time he had been largely immobile in bed. In the ED he was found to be in atrial fibrillation with a heart rate of 130. Blood pressure 90 systolic. Bun creatinine 128/4.5. Creatinine was 3.4 three days prior. Baseline creatinine appears to be 1.7- 3.0.    Hospital Course:  Following his admission, the pt was found to have a group B strep pyelo w/ bacteremia. He was treated for this appropriately, but clinically had not responded. His acute on chronic renal failure, noted at admit, progressed, and he was suffering with profound uremia. Nephrology was been consulted, and extensive discussion was carried out with the family. Ultimately, in the setting of probable recurrent severe prostate CA, the family chose to honor his prior  stated wish to avoid HD, and to transition to comfort focused care.  With comfort measures in place, the pt died peacefully on Jan 06, 2014 at 23:30.    SignedJoette Catching T  [facility] 12/22/2013, 7:33 PM

## 2013-12-28 ENCOUNTER — Ambulatory Visit (HOSPITAL_COMMUNITY): Payer: Medicare Other

## 2013-12-28 ENCOUNTER — Other Ambulatory Visit: Payer: Medicare Other

## 2013-12-30 ENCOUNTER — Ambulatory Visit: Payer: Medicare Other | Admitting: Oncology

## 2014-03-24 ENCOUNTER — Encounter (HOSPITAL_COMMUNITY): Payer: Self-pay | Admitting: Cardiovascular Disease
